# Patient Record
Sex: Female | Born: 1955 | Race: White | Hispanic: No | Marital: Single | Smoking: Never smoker
Health system: Southern US, Community
[De-identification: ages and names within clinical notes are randomized; demographics above are authoritative.]

## PROBLEM LIST (undated history)

## (undated) DIAGNOSIS — M549 Dorsalgia, unspecified: Secondary | ICD-10-CM

## (undated) DIAGNOSIS — M542 Cervicalgia: Secondary | ICD-10-CM

## (undated) DIAGNOSIS — S8290XA Unspecified fracture of unspecified lower leg, initial encounter for closed fracture: Secondary | ICD-10-CM

## (undated) DIAGNOSIS — E559 Vitamin D deficiency, unspecified: Secondary | ICD-10-CM

## (undated) DIAGNOSIS — I10 Essential (primary) hypertension: Secondary | ICD-10-CM

## (undated) DIAGNOSIS — F32A Depression, unspecified: Secondary | ICD-10-CM

## (undated) DIAGNOSIS — M1990 Unspecified osteoarthritis, unspecified site: Secondary | ICD-10-CM

## (undated) DIAGNOSIS — E079 Disorder of thyroid, unspecified: Secondary | ICD-10-CM

## (undated) DIAGNOSIS — E785 Hyperlipidemia, unspecified: Secondary | ICD-10-CM

## (undated) HISTORY — DX: Hyperlipidemia, unspecified: E78.5

## (undated) HISTORY — DX: Depression, unspecified: F32.A

## (undated) HISTORY — DX: Vitamin D deficiency, unspecified: E55.9

## (undated) HISTORY — DX: Unspecified fracture of unspecified lower leg, initial encounter for closed fracture: S82.90XA

## (undated) HISTORY — DX: Cervicalgia: M54.2

## (undated) HISTORY — DX: Unspecified osteoarthritis, unspecified site: M19.90

## (undated) HISTORY — DX: Disorder of thyroid, unspecified: E07.9

## (undated) HISTORY — DX: Dorsalgia, unspecified: M54.9

## (undated) HISTORY — PX: TONSILLECTOMY: SUR1361

## (undated) HISTORY — DX: Essential (primary) hypertension: I10

---

## 2003-04-14 ENCOUNTER — Encounter: Admission: RE | Admit: 2003-04-14 | Discharge: 2003-07-13 | Payer: Self-pay | Admitting: Family Medicine

## 2003-05-01 ENCOUNTER — Encounter: Admission: RE | Admit: 2003-05-01 | Discharge: 2003-05-01 | Payer: Self-pay | Admitting: Family Medicine

## 2003-05-01 ENCOUNTER — Encounter: Payer: Self-pay | Admitting: Family Medicine

## 2004-07-20 ENCOUNTER — Other Ambulatory Visit: Admission: RE | Admit: 2004-07-20 | Discharge: 2004-07-20 | Payer: Self-pay | Admitting: Family Medicine

## 2006-01-24 ENCOUNTER — Other Ambulatory Visit: Admission: RE | Admit: 2006-01-24 | Discharge: 2006-01-24 | Payer: Self-pay | Admitting: Family Medicine

## 2006-06-12 ENCOUNTER — Encounter: Admission: RE | Admit: 2006-06-12 | Discharge: 2006-06-12 | Payer: Self-pay | Admitting: Family Medicine

## 2007-02-13 ENCOUNTER — Other Ambulatory Visit: Admission: RE | Admit: 2007-02-13 | Discharge: 2007-02-13 | Payer: Self-pay | Admitting: Family Medicine

## 2007-07-11 ENCOUNTER — Encounter: Admission: RE | Admit: 2007-07-11 | Discharge: 2007-07-11 | Payer: Self-pay | Admitting: Family Medicine

## 2008-03-18 ENCOUNTER — Other Ambulatory Visit: Admission: RE | Admit: 2008-03-18 | Discharge: 2008-03-18 | Payer: Self-pay | Admitting: Family Medicine

## 2008-09-23 ENCOUNTER — Encounter: Admission: RE | Admit: 2008-09-23 | Discharge: 2008-09-23 | Payer: Self-pay | Admitting: Family Medicine

## 2009-04-19 ENCOUNTER — Other Ambulatory Visit: Admission: RE | Admit: 2009-04-19 | Discharge: 2009-04-19 | Payer: Self-pay | Admitting: Family Medicine

## 2009-04-21 ENCOUNTER — Encounter: Admission: RE | Admit: 2009-04-21 | Discharge: 2009-04-21 | Payer: Self-pay | Admitting: Family Medicine

## 2010-09-20 ENCOUNTER — Inpatient Hospital Stay (HOSPITAL_COMMUNITY): Admission: AD | Admit: 2010-09-20 | Discharge: 2010-09-23 | Payer: Self-pay | Admitting: Family Medicine

## 2010-09-20 ENCOUNTER — Ambulatory Visit: Payer: Self-pay | Admitting: Family Medicine

## 2010-09-28 ENCOUNTER — Ambulatory Visit: Payer: Self-pay | Admitting: Obstetrics and Gynecology

## 2010-12-02 ENCOUNTER — Other Ambulatory Visit
Admission: RE | Admit: 2010-12-02 | Discharge: 2010-12-02 | Payer: Self-pay | Source: Home / Self Care | Admitting: Family Medicine

## 2010-12-08 ENCOUNTER — Encounter
Admission: RE | Admit: 2010-12-08 | Discharge: 2010-12-08 | Payer: Self-pay | Source: Home / Self Care | Attending: Family Medicine | Admitting: Family Medicine

## 2010-12-13 ENCOUNTER — Encounter
Admission: RE | Admit: 2010-12-13 | Discharge: 2010-12-13 | Payer: Self-pay | Source: Home / Self Care | Attending: Family Medicine | Admitting: Family Medicine

## 2010-12-15 ENCOUNTER — Encounter
Admission: RE | Admit: 2010-12-15 | Discharge: 2010-12-15 | Payer: Self-pay | Source: Home / Self Care | Attending: Family Medicine | Admitting: Family Medicine

## 2011-02-15 LAB — VANCOMYCIN, TROUGH: Vancomycin Tr: 8.9 ug/mL — ABNORMAL LOW (ref 10.0–20.0)

## 2011-02-15 LAB — COMPREHENSIVE METABOLIC PANEL
Albumin: 3.9 g/dL (ref 3.5–5.2)
Alkaline Phosphatase: 94 U/L (ref 39–117)
BUN: 10 mg/dL (ref 6–23)
CO2: 24 mEq/L (ref 19–32)
Chloride: 101 mEq/L (ref 96–112)
Creatinine, Ser: 0.6 mg/dL (ref 0.4–1.2)
GFR calc non Af Amer: >60 mL/min (ref >60)
Glucose, Bld: 305 mg/dL — ABNORMAL HIGH (ref 70–99)
Potassium: 4.1 mEq/L (ref 3.5–5.1)
Total Bilirubin: 0.4 mg/dL (ref 0.3–1.2)

## 2011-02-15 LAB — CBC
HCT: 40.9 % (ref 36.0–46.0)
Hemoglobin: 13.7 g/dL (ref 12.0–15.0)
MCH: 27.5 pg (ref 26.0–34.0)
MCV: 82 fL (ref 78.0–100.0)
RBC: 4.99 MIL/uL (ref 3.87–5.11)
WBC: 15.8 10*3/uL — ABNORMAL HIGH (ref 4.0–10.5)

## 2011-02-15 LAB — CULTURE, BLOOD (ROUTINE X 2)
Culture  Setup Time: 201110190410
Culture: NO GROWTH
Culture: NO GROWTH

## 2011-02-15 LAB — GLUCOSE, CAPILLARY
Glucose-Capillary: 121 mg/dL — ABNORMAL HIGH (ref 70–99)
Glucose-Capillary: 124 mg/dL — ABNORMAL HIGH (ref 70–99)
Glucose-Capillary: 208 mg/dL — ABNORMAL HIGH (ref 70–99)
Glucose-Capillary: 283 mg/dL — ABNORMAL HIGH (ref 70–99)
Glucose-Capillary: 299 mg/dL — ABNORMAL HIGH (ref 70–99)
Glucose-Capillary: 93 mg/dL (ref 70–99)

## 2011-02-15 LAB — HEMOGLOBIN A1C
Hgb A1c MFr Bld: 9.8 % — ABNORMAL HIGH (ref <5.7)
Mean Plasma Glucose: 235 mg/dL — ABNORMAL HIGH (ref <117)

## 2011-02-15 LAB — WOUND CULTURE

## 2011-07-05 ENCOUNTER — Encounter: Payer: Self-pay | Admitting: Podiatry

## 2011-07-05 DIAGNOSIS — E119 Type 2 diabetes mellitus without complications: Secondary | ICD-10-CM | POA: Insufficient documentation

## 2011-07-05 DIAGNOSIS — J45909 Unspecified asthma, uncomplicated: Secondary | ICD-10-CM | POA: Insufficient documentation

## 2011-07-05 DIAGNOSIS — E042 Nontoxic multinodular goiter: Secondary | ICD-10-CM | POA: Insufficient documentation

## 2011-07-05 DIAGNOSIS — I10 Essential (primary) hypertension: Secondary | ICD-10-CM

## 2011-07-05 DIAGNOSIS — M722 Plantar fascial fibromatosis: Secondary | ICD-10-CM

## 2011-07-05 DIAGNOSIS — E079 Disorder of thyroid, unspecified: Secondary | ICD-10-CM

## 2012-02-07 ENCOUNTER — Other Ambulatory Visit: Payer: Self-pay | Admitting: Family Medicine

## 2012-02-07 DIAGNOSIS — E049 Nontoxic goiter, unspecified: Secondary | ICD-10-CM

## 2012-02-12 ENCOUNTER — Ambulatory Visit
Admission: RE | Admit: 2012-02-12 | Discharge: 2012-02-12 | Disposition: A | Discharge status: Home / Self Care | Payer: BC Managed Care – PPO | Source: Ambulatory Visit | Attending: Family Medicine | Admitting: Family Medicine

## 2012-02-12 DIAGNOSIS — E049 Nontoxic goiter, unspecified: Secondary | ICD-10-CM

## 2013-03-11 ENCOUNTER — Other Ambulatory Visit (HOSPITAL_COMMUNITY)
Admission: RE | Admit: 2013-03-11 | Discharge: 2013-03-11 | Disposition: A | Discharge status: Home / Self Care | Payer: BC Managed Care – PPO | Source: Ambulatory Visit | Attending: Family Medicine | Admitting: Family Medicine

## 2013-03-11 ENCOUNTER — Other Ambulatory Visit: Payer: Self-pay | Admitting: Family Medicine

## 2013-03-11 DIAGNOSIS — Z1151 Encounter for screening for human papillomavirus (HPV): Secondary | ICD-10-CM | POA: Insufficient documentation

## 2013-03-11 DIAGNOSIS — E049 Nontoxic goiter, unspecified: Secondary | ICD-10-CM

## 2013-03-11 DIAGNOSIS — Z124 Encounter for screening for malignant neoplasm of cervix: Secondary | ICD-10-CM | POA: Insufficient documentation

## 2013-03-11 DIAGNOSIS — Z1231 Encounter for screening mammogram for malignant neoplasm of breast: Secondary | ICD-10-CM

## 2013-03-19 ENCOUNTER — Ambulatory Visit
Admission: RE | Admit: 2013-03-19 | Discharge: 2013-03-19 | Disposition: A | Discharge status: Home / Self Care | Payer: BC Managed Care – PPO | Source: Ambulatory Visit | Attending: Family Medicine | Admitting: Family Medicine

## 2013-03-19 ENCOUNTER — Other Ambulatory Visit: Payer: BC Managed Care – PPO

## 2013-03-19 DIAGNOSIS — E049 Nontoxic goiter, unspecified: Secondary | ICD-10-CM

## 2013-03-31 ENCOUNTER — Ambulatory Visit
Admission: RE | Admit: 2013-03-31 | Discharge: 2013-03-31 | Disposition: A | Discharge status: Home / Self Care | Payer: BC Managed Care – PPO | Source: Ambulatory Visit | Attending: Family Medicine | Admitting: Family Medicine

## 2013-03-31 DIAGNOSIS — Z1231 Encounter for screening mammogram for malignant neoplasm of breast: Secondary | ICD-10-CM

## 2013-04-01 ENCOUNTER — Ambulatory Visit: Payer: BC Managed Care – PPO

## 2014-10-14 ENCOUNTER — Other Ambulatory Visit: Payer: Self-pay | Admitting: Family Medicine

## 2014-10-14 DIAGNOSIS — E049 Nontoxic goiter, unspecified: Secondary | ICD-10-CM

## 2014-10-16 ENCOUNTER — Other Ambulatory Visit: Payer: BC Managed Care – PPO

## 2014-10-22 ENCOUNTER — Ambulatory Visit
Admission: RE | Admit: 2014-10-22 | Discharge: 2014-10-22 | Disposition: A | Discharge status: Home / Self Care | Payer: BC Managed Care – PPO | Source: Ambulatory Visit | Attending: Family Medicine | Admitting: Family Medicine

## 2014-10-22 DIAGNOSIS — E049 Nontoxic goiter, unspecified: Secondary | ICD-10-CM

## 2015-02-10 ENCOUNTER — Other Ambulatory Visit: Payer: Self-pay | Admitting: Orthopaedic Surgery

## 2015-02-10 DIAGNOSIS — M25552 Pain in left hip: Secondary | ICD-10-CM

## 2015-02-13 ENCOUNTER — Ambulatory Visit
Admission: RE | Admit: 2015-02-13 | Discharge: 2015-02-13 | Disposition: A | Discharge status: Home / Self Care | Payer: BLUE CROSS/BLUE SHIELD | Source: Ambulatory Visit | Attending: Orthopaedic Surgery | Admitting: Orthopaedic Surgery

## 2015-02-13 DIAGNOSIS — M25552 Pain in left hip: Secondary | ICD-10-CM

## 2015-02-23 ENCOUNTER — Other Ambulatory Visit: Payer: Self-pay | Admitting: Orthopaedic Surgery

## 2015-02-23 DIAGNOSIS — M25552 Pain in left hip: Secondary | ICD-10-CM

## 2015-04-23 ENCOUNTER — Other Ambulatory Visit (HOSPITAL_COMMUNITY)
Admission: RE | Admit: 2015-04-23 | Discharge: 2015-04-23 | Disposition: A | Discharge status: Home / Self Care | Payer: BLUE CROSS/BLUE SHIELD | Source: Ambulatory Visit | Attending: Family Medicine | Admitting: Family Medicine

## 2015-04-23 ENCOUNTER — Other Ambulatory Visit: Payer: Self-pay | Admitting: Family Medicine

## 2015-04-23 DIAGNOSIS — Z1231 Encounter for screening mammogram for malignant neoplasm of breast: Secondary | ICD-10-CM

## 2015-04-23 DIAGNOSIS — Z124 Encounter for screening for malignant neoplasm of cervix: Secondary | ICD-10-CM | POA: Diagnosis present

## 2015-04-23 DIAGNOSIS — Z1151 Encounter for screening for human papillomavirus (HPV): Secondary | ICD-10-CM | POA: Insufficient documentation

## 2015-04-27 LAB — CYTOLOGY - PAP

## 2015-04-29 ENCOUNTER — Ambulatory Visit: Payer: BLUE CROSS/BLUE SHIELD

## 2015-05-05 ENCOUNTER — Ambulatory Visit: Payer: BLUE CROSS/BLUE SHIELD

## 2015-05-05 ENCOUNTER — Ambulatory Visit
Admission: RE | Admit: 2015-05-05 | Discharge: 2015-05-05 | Disposition: A | Discharge status: Home / Self Care | Payer: BLUE CROSS/BLUE SHIELD | Source: Ambulatory Visit | Attending: Family Medicine | Admitting: Family Medicine

## 2015-05-05 DIAGNOSIS — Z1231 Encounter for screening mammogram for malignant neoplasm of breast: Secondary | ICD-10-CM

## 2015-05-06 ENCOUNTER — Ambulatory Visit
Admission: RE | Admit: 2015-05-06 | Discharge: 2015-05-06 | Disposition: A | Discharge status: Home / Self Care | Payer: BLUE CROSS/BLUE SHIELD | Source: Ambulatory Visit | Attending: Family Medicine | Admitting: Family Medicine

## 2015-11-18 ENCOUNTER — Ambulatory Visit (INDEPENDENT_AMBULATORY_CARE_PROVIDER_SITE_OTHER): Payer: 59 | Admitting: Endocrinology

## 2015-11-18 ENCOUNTER — Encounter: Payer: Self-pay | Admitting: Endocrinology

## 2015-11-18 VITALS — BP 124/72 | HR 72 | Temp 98.2°F | Resp 16 | Ht 62.0 in | Wt 181.6 lb

## 2015-11-18 DIAGNOSIS — E1165 Type 2 diabetes mellitus with hyperglycemia: Secondary | ICD-10-CM | POA: Diagnosis not present

## 2015-11-18 DIAGNOSIS — Z794 Long term (current) use of insulin: Secondary | ICD-10-CM

## 2015-11-18 NOTE — Patient Instructions (Signed)
Check blood sugars on waking up   times a week Also check blood sugars about 2 hours after a meal and do this after different meals by rotation  Recommended blood sugar levels on waking up is 90-130 and about 2 hours after meal is 130-160  Please bring your blood sugar monitor to each visit, thank you  Avoid cereals in ams  Leave off Trulicity

## 2015-11-18 NOTE — Progress Notes (Addendum)
Patient ID: Natasha Chandler, female   DOB: 1956-11-18, 59 y.o.   MRN: [id number]           Reason for Appointment: Consultation for Type 2 Diabetes  Referring physician: Cam Hai  History of Present Illness:          Date of diagnosis of type 2 diabetes mellitus: 2004       Background history:   She was treated with oral hypoglycemic drugs like metformin and first few years Insulin was started around 2008-9 but details are not available. She thinks she has taken Lantus for several years, at some point was also taking Humalog. Trulicity was started 1-2 years ago, taking Invokana 3 yrs  Over the last 2-3 years she has been having poor control and has had difficulty focusing on her diabetes because of family issues and periodically not having insurance  Recent history:   INSULIN regimen is: Lantus 68-71 in am      She has been referred here for poor control of her diabetes. She has been on a regimen of basal insulin, Trulicity, metformin and Invokana for some time but without apparently good control.  Previous records are not available in her last A1c was 8.4 in June More recently because of her having gastroenteritis about a month ago she apparently had a glucose of 539 with her PCP and has been referred for further management  No recent A1c is available  Current blood sugar patterns and problems identified:    until about 2-3 weeks ago the patient was taking her medications erratically because of cost and will not take all of them consistently  More recently the patient has been trying to watch her portions and carbohydrates better in her blood sugars are improving especially in the last 10 days  She has fairly good fasting readings recently although higher this morning from a snack last night  She has not done many postprandial readings but they appear to be sporadically higher, mostly when she is going off her diet and eating sweets or eating cereal in the morning including  today  Because of her recent intercurrent illnesses and other issues she has not been able to do any walking for exercise which she usually does  Non-insulin hypoglycemic drugs the patient is taking are: Metformin 1g bid, Invokana 300 mg daily, Trulicity 1.5 mg weekly  Side effects from medications have been:none  Compliance with the medical regimen: generally poor Hypoglycemia: had one episode of hypoglycemia at 3 PM   Glucose monitoring:  done 1-to times a day         Glucometer: One Touch.      Blood Glucose readings by time of day and averages from meter download:  PREMEAL Breakfast Lunch Dinner Bedtime  Overall   Glucose range: 88-241 91-252 99-278 106-205 60-278  Median:     130    Self-care: The diet that the patient has been following is: tries to limit sweets.     Meal times are:  Breakfast is at Lunch: Dinner: 7 pm   Typical meal intake: Breakfast is cereal              Dietician visit, most recent: 4 yrs               Exercise: 2 miles 3-4/7 irreg   Weight history: 162-187  Wt Readings from Last 3 Encounters:  11/18/15 181 lb 9.6 oz (82.373 kg)    Glycemic control:   Lab Results  Component  Value Date   HGBA1C * 09/21/2010    9.8 (NOTE)                                                                       According to the ADA Clinical Practice Recommendations for 2011, when HbA1c is used as a screening test:   >=6.5%   Diagnostic of Diabetes Mellitus           (if abnormal result  is confirmed)  5.7-6.4%   Increased risk of developing Diabetes Mellitus  References:Diagnosis and Classification of Diabetes Mellitus,Diabetes Care,2011,34(Suppl 1):S62-S69 and Standards of Medical Care in         Diabetes - 2011,Diabetes Care,2011,34  (Suppl 1):S11-S61.   Lab Results  Component Value Date   CREATININE 0.60 09/21/2010         Medication List       This list is accurate as of: 11/18/15  3:41 PM.  Always use your most recent med list.                amLODipine 10 MG tablet  Commonly known as:  NORVASC  Take 10 mg by mouth daily.     INVOKANA 300 MG Tabs tablet  Generic drug:  canagliflozin  Take 300 mg by mouth daily before breakfast.     LANTUS SOLOSTAR [place]  Inject 66-70 Units into the skin daily.     losartan 100 MG tablet  Commonly known as:  COZAAR  Take 100 mg by mouth daily.     METFORMIN HCL PO  Take 500 mg by mouth 2 (two) times daily.     simvastatin 20 MG tablet  Commonly known as:  ZOCOR  Take 20 mg by mouth daily.     sodium chloride 0.9 % SOLN 100 mL with insulin regular 100 units/mL SOLN 1 Units/mL  Inject into the vein continuous.     TRULICITY 1.5 MG/0.5ML Sopn  Generic drug:  Dulaglutide  Inject 1.5 mg into the skin once a week.        Allergies:  Allergies  Allergen Reactions  . Penicillins   . Sulfa Antibiotics     Past Medical History  Diagnosis Date  . Asthma   . Thyroid disease   . Hypertension   . Diabetes mellitus     Past Surgical History  Procedure Laterality Date  . Tonsillectomy      Family History  Problem Relation Age of Onset  . Diabetes Mother   . Diabetes Father   . Diabetes Maternal Aunt     Social History:  reports that she does not drink alcohol. Her tobacco and drug histories are not on file.    Review of Systems    Lipid history:    No results found for: CHOL, HDL, LDLCALC, LDLDIRECT, TRIG, CHOLHDL         Hypertension: present  Most recent eye exam was 7/16  Most recent foot exam: 12/16  Review of Systems  Constitutional: Negative for reduced appetite and malaise.  HENT: Negative for trouble swallowing.   Eyes: Positive for blurred vision.  Respiratory: Negative for cough.   Cardiovascular: Negative for chest pain.  Gastrointestinal: Negative for nausea and vomiting.  Endocrine: Negative for abnormal weight gain, general  weakness and light-headedness.  Genitourinary: Negative for frequency.  Musculoskeletal: Positive for joint pain.        Has had hip pain from old injuries  Skin: Negative for rash.  Neurological: Negative for numbness and tingling.  Psychiatric/Behavioral: Negative for depressed mood.   Thyroid: She has had a goiter since at least 2013 Has only one nodule 1.6 cm to the left of the isthmus which has been stable as of the last ultrasound in 2015   LABS:  No visits with results within 1 Week(s) from this visit. Latest known visit with results is:  Orders Only on 04/23/2015  Component Date Value Ref Range Status  . CYTOLOGY - PAP 04/23/2015 PAP RESULT   Final    Physical Examination:  BP 124/72 mmHg  Pulse 72  Temp(Src) 98.2 F (36.8 C) (Oral)  Resp 16  Ht 5\' 2"  (1.575 m)  Wt 181 lb 9.6 oz (82.373 kg)  BMI 33.21 kg/m2  SpO2 97%  GENERAL:         Patient has generalized obesity.   HEENT:         Eye exam shows normal external appearance. Fundus exam shows no retinopathy.  Oral exam shows normal mucosa .  NECK:   There is no lymphadenopathy Thyroid is enlarged, firm, nodular and about twice normal, relatively larger on the left Carotids are normal to palpation and no bruit heard LUNGS:         Chest is symmetrical. Lungs are clear to auscultation.Marland Kitchen.   HEART:         Heart sounds:  S1 and S2 are normal. No murmur or click heard., no S3 or S4.   ABDOMEN:   There is no distention present. Liver and spleen are not palpable. No other mass or tenderness present.   NEUROLOGICAL:   Vibration sense is only mildly reduced in distal first toes. Ankle jerks are absent bilaterally.          Biceps reflexes are normal Diabetic foot exam shows normal monofilament sensation in the toes and plantar surfaces, no skin lesions or ulcers on the feet and normal pedal pulses  MUSCULOSKELETAL:  There is no swelling or deformity of the peripheral joints. Spine is normal to inspection.   EXTREMITIES:     There is no edema. No skin lesions present.Marland Kitchen. SKIN:       No rash or lesions of concern.         ASSESSMENT:  Diabetes type 2, uncontrolled with BMI 33 She has had long-standing diabetes and has difficulty with consistent control partly related to her financial issues, stress and family issues preventing her from being consistently compliant Although her blood sugars are generally near normal in the last 10 days she has not checked enough readings after meals Has sporadic high readings nonfasting usually related to excessive carbohydrate intake and is also having issues with portion control at times despite taking Trulicity   She is not taking any mealtime coverage at this time Does need more diabetes education   Complications:none evident  GOITER: 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  Consider using [employer] brand regular insulin if she has consistently high postprandial readings  Consultation with dietitian  More consistent glucose monitoring after meals  Restart walking for exercise either outside or at the gym  Moderate on carbohydrates and sweets.  Discussed options for breakfast instead of cereals  Review blood sugars in one month  Patient Instructions  Check blood sugars on waking up   times a week Also check blood sugars about 2 hours after a meal and do this after different meals by rotation  Recommended blood sugar levels on waking up is 90-130 and about 2 hours after meal is 130-160  Please bring your blood sugar monitor to each visit, thank you  Avoid cereals in ams  Leave off Trulicity    [facility] 11/18/2015, 3:41 PM   Note: This office note was  prepared with Dragon voice recognition system technology. Any transcriptional errors that result from this process are unintentional.

## 2015-11-26 ENCOUNTER — Encounter (HOSPITAL_BASED_OUTPATIENT_CLINIC_OR_DEPARTMENT_OTHER): Payer: Self-pay | Admitting: *Deleted

## 2015-11-26 DIAGNOSIS — Z79899 Other long term (current) drug therapy: Secondary | ICD-10-CM | POA: Insufficient documentation

## 2015-11-26 DIAGNOSIS — Z7984 Long term (current) use of oral hypoglycemic drugs: Secondary | ICD-10-CM | POA: Diagnosis not present

## 2015-11-26 DIAGNOSIS — J45909 Unspecified asthma, uncomplicated: Secondary | ICD-10-CM | POA: Insufficient documentation

## 2015-11-26 DIAGNOSIS — Y93K1 Activity, walking an animal: Secondary | ICD-10-CM | POA: Insufficient documentation

## 2015-11-26 DIAGNOSIS — S8002XA Contusion of left knee, initial encounter: Secondary | ICD-10-CM | POA: Insufficient documentation

## 2015-11-26 DIAGNOSIS — S80211A Abrasion, right knee, initial encounter: Secondary | ICD-10-CM | POA: Insufficient documentation

## 2015-11-26 DIAGNOSIS — S60512A Abrasion of left hand, initial encounter: Secondary | ICD-10-CM | POA: Insufficient documentation

## 2015-11-26 DIAGNOSIS — Y998 Other external cause status: Secondary | ICD-10-CM | POA: Diagnosis not present

## 2015-11-26 DIAGNOSIS — E119 Type 2 diabetes mellitus without complications: Secondary | ICD-10-CM | POA: Insufficient documentation

## 2015-11-26 DIAGNOSIS — E785 Hyperlipidemia, unspecified: Secondary | ICD-10-CM | POA: Diagnosis not present

## 2015-11-26 DIAGNOSIS — S8001XA Contusion of right knee, initial encounter: Secondary | ICD-10-CM | POA: Diagnosis not present

## 2015-11-26 DIAGNOSIS — S60511A Abrasion of right hand, initial encounter: Secondary | ICD-10-CM | POA: Diagnosis not present

## 2015-11-26 DIAGNOSIS — Z880 Allergy status to penicillin: Secondary | ICD-10-CM | POA: Insufficient documentation

## 2015-11-26 DIAGNOSIS — Y9289 Other specified places as the place of occurrence of the external cause: Secondary | ICD-10-CM | POA: Insufficient documentation

## 2015-11-26 DIAGNOSIS — S0081XA Abrasion of other part of head, initial encounter: Secondary | ICD-10-CM | POA: Insufficient documentation

## 2015-11-26 DIAGNOSIS — W1839XA Other fall on same level, initial encounter: Secondary | ICD-10-CM | POA: Insufficient documentation

## 2015-11-26 DIAGNOSIS — S80212A Abrasion, left knee, initial encounter: Secondary | ICD-10-CM | POA: Insufficient documentation

## 2015-11-26 DIAGNOSIS — S93401A Sprain of unspecified ligament of right ankle, initial encounter: Secondary | ICD-10-CM | POA: Diagnosis not present

## 2015-11-26 DIAGNOSIS — S99911A Unspecified injury of right ankle, initial encounter: Secondary | ICD-10-CM | POA: Diagnosis present

## 2015-11-26 DIAGNOSIS — Z794 Long term (current) use of insulin: Secondary | ICD-10-CM | POA: Insufficient documentation

## 2015-11-26 NOTE — ED Notes (Signed)
Pt c/o fall from standing landing on sidewalk c/o abrasions to face and hands denies LOC

## 2015-11-27 ENCOUNTER — Emergency Department (HOSPITAL_BASED_OUTPATIENT_CLINIC_OR_DEPARTMENT_OTHER): Payer: 59

## 2015-11-27 ENCOUNTER — Emergency Department (HOSPITAL_BASED_OUTPATIENT_CLINIC_OR_DEPARTMENT_OTHER)
Admission: EM | Admit: 2015-11-27 | Discharge: 2015-11-27 | Disposition: A | Discharge status: Home / Self Care | Payer: 59 | Attending: Emergency Medicine | Admitting: Emergency Medicine

## 2015-11-27 DIAGNOSIS — S8000XA Contusion of unspecified knee, initial encounter: Secondary | ICD-10-CM

## 2015-11-27 DIAGNOSIS — S93401A Sprain of unspecified ligament of right ankle, initial encounter: Secondary | ICD-10-CM

## 2015-11-27 DIAGNOSIS — S0081XA Abrasion of other part of head, initial encounter: Secondary | ICD-10-CM

## 2015-11-27 DIAGNOSIS — W19XXXA Unspecified fall, initial encounter: Secondary | ICD-10-CM

## 2015-11-27 MED ORDER — HYDROCODONE-ACETAMINOPHEN 5-325 MG PO TABS: 1.0000 | ORAL_TABLET | Freq: Four times a day (QID) | ORAL | Status: DC | PRN

## 2015-11-27 MED ORDER — IBUPROFEN 600 MG PO TABS: 600.0000 mg | ORAL_TABLET | Freq: Four times a day (QID) | ORAL | Status: DC | PRN

## 2015-11-27 MED ORDER — HYDROCODONE-ACETAMINOPHEN 5-325 MG PO TABS
1.0000 | ORAL_TABLET | Freq: Once | ORAL | Status: AC
  Administered 2015-11-27: 1 via ORAL
  Filled 2015-11-27: qty 1

## 2015-11-27 MED ORDER — BACITRACIN ZINC 500 UNIT/GM EX OINT
TOPICAL_OINTMENT | Freq: Two times a day (BID) | CUTANEOUS | Status: DC
  Administered 2015-11-27: 15.5556 via TOPICAL
  Filled 2015-11-27: qty 28.35

## 2015-11-27 NOTE — Discharge Instructions (Signed)
Ankle Sprain °An ankle sprain is an injury to the strong, fibrous tissues (ligaments) that hold the bones of your ankle joint together.  °CAUSES °An ankle sprain is usually caused by a fall or by twisting your ankle. Ankle sprains most commonly occur when you step on the outer edge of your foot, and your ankle turns inward. People who participate in sports are more prone to these types of injuries.  °SYMPTOMS  °· Pain in your ankle. The pain may be present at rest or only when you are trying to stand or walk. °· Swelling. °· Bruising. Bruising may develop immediately or within 1 to 2 days after your injury. °· Difficulty standing or walking, particularly when turning corners or changing directions. °DIAGNOSIS  °Your caregiver will ask you details about your injury and perform a physical exam of your ankle to determine if you have an ankle sprain. During the physical exam, your caregiver will press on and apply pressure to specific areas of your foot and ankle. Your caregiver will try to move your ankle in certain ways. An X-ray exam may be done to be sure a bone was not broken or a ligament did not separate from one of the bones in your ankle (avulsion fracture).  °TREATMENT  °Certain types of braces can help stabilize your ankle. Your caregiver can make a recommendation for this. Your caregiver may recommend the use of medicine for pain. If your sprain is severe, your caregiver may refer you to a surgeon who helps to restore function to parts of your skeletal system (orthopedist) or a physical therapist. °HOME CARE INSTRUCTIONS  °· Apply ice to your injury for 1-2 days or as directed by your caregiver. Applying ice helps to reduce inflammation and pain. °¨ Put ice in a plastic bag. °¨ Place a towel between your skin and the bag. °¨ Leave the ice on for 15-20 minutes at a time, every 2 hours while you are awake. °· Only take over-the-counter or prescription medicines for pain, discomfort, or fever as directed by  your caregiver. °· Elevate your injured ankle above the level of your heart as much as possible for 2-3 days. °· If your caregiver recommends crutches, use them as instructed. Gradually put weight on the affected ankle. Continue to use crutches or a cane until you can walk without feeling pain in your ankle. °· If you have a plaster splint, wear the splint as directed by your caregiver. Do not rest it on anything harder than a pillow for the first 24 hours. Do not put weight on it. Do not get it wet. You may take it off to take a shower or bath. °· You may have been given an elastic bandage to wear around your ankle to provide support. If the elastic bandage is too tight (you have numbness or tingling in your foot or your foot becomes cold and blue), adjust the bandage to make it comfortable. °· If you have an air splint, you may blow more air into it or let air out to make it more comfortable. You may take your splint off at night and before taking a shower or bath. Wiggle your toes in the splint several times per day to decrease swelling. °SEEK MEDICAL CARE IF:  °· You have rapidly increasing bruising or swelling. °· Your toes feel extremely cold or you lose feeling in your foot. °· Your pain is not relieved with medicine. °SEEK IMMEDIATE MEDICAL CARE IF: °· Your toes are numb or blue. °·   You have severe pain that is increasing. MAKE SURE YOU:   Understand these instructions.  Will watch your condition.  Will get help right away if you are not doing well or get worse.   This information is not intended to replace advice given to you by your health care provider. Make sure you discuss any questions you have with your health care provider.   Document Released: 11/20/2005 Document Revised: 12/11/2014 Document Reviewed: 12/02/2011 Elsevier Interactive Patient Education 2016 [employer]  Abrasion An abrasion is a cut or scrape on the outer surface of your skin. An abrasion does not extend through all  of the layers of your skin. It is important to care for your abrasion properly to prevent infection. CAUSES Most abrasions are caused by falling on or gliding across the ground or another surface. When your skin rubs on something, the outer and inner layer of skin rubs off.  SYMPTOMS A cut or scrape is the main symptom of this condition. The scrape may be bleeding, or it may appear red or pink. If there was an associated fall, there may be an underlying bruise. DIAGNOSIS An abrasion is diagnosed with a physical exam. TREATMENT Treatment for this condition depends on how large and deep the abrasion is. Usually, your abrasion will be cleaned with water and mild soap. This removes any dirt or debris that may be stuck. An antibiotic ointment may be applied to the abrasion to help prevent infection. A bandage (dressing) may be placed on the abrasion to keep it clean. You may also need a tetanus shot. HOME CARE INSTRUCTIONS Medicines  Take or apply medicines only as directed by your health care provider.  If you were prescribed an antibiotic ointment, finish all of it even if you start to feel better. Wound Care  Clean the wound with mild soap and water 2-3 times per day or as directed by your health care provider. Pat your wound dry with a clean towel. Do not rub it.  There are many different ways to close and cover a wound. Follow instructions from your health care provider about:  Wound care.  Dressing changes and removal.  Check your wound every day for signs of infection. Watch for:  Redness, swelling, or pain.  Fluid, blood, or pus. General Instructions  Keep the dressing dry as directed by your health care provider. Do not take baths, swim, use a hot tub, or do anything that would put your wound underwater until your health care provider approves.  If there is swelling, raise (elevate) the injured area above the level of your heart while you are sitting or lying down.  Keep all  follow-up visits as directed by your health care provider. This is important. SEEK MEDICAL CARE IF:  You received a tetanus shot and you have swelling, severe pain, redness, or bleeding at the injection site.  Your pain is not controlled with medicine.  You have increased redness, swelling, or pain at the site of your wound. SEEK IMMEDIATE MEDICAL CARE IF:  You have a red streak going away from your wound.  You have a fever.  You have fluid, blood, or pus coming from your wound.  You notice a bad smell coming from your wound or your dressing.   This information is not intended to replace advice given to you by your health care provider. Make sure you discuss any questions you have with your health care provider.   Document Released: 08/30/2005 Document Revised: 08/11/2015 Document Reviewed:  11/18/2014 Elsevier Interactive Patient Education [employer]

## 2015-11-27 NOTE — ED Notes (Signed)
Patient transported to X-ray 

## 2015-11-27 NOTE — ED Provider Notes (Signed)
CSN: [id number]     Arrival date & time 11/26/15  2332 History   First MD Initiated Contact with Patient 11/27/15 0103     Chief Complaint  Patient presents with  . Fall     (Consider location/radiation/quality/duration/timing/severity/associated sxs/prior Treatment) HPI  This is a 59 year old female with a history of diabetes, hypertension who presents following a fall. Patient reports that she was walking her dog when she got pulled off the curb. She states "I fell on my face.". She braced herself with her hands. She reports bilateral hand abrasions, bilateral knee pain, and facial abrasions. Denies loss of consciousness. Denies vomiting. She takes a baby aspirin daily but denies any anticoagulant use. Denies any dizziness. She has been ambulatory. Reports pain to her right ankle. Current pain is 2 out of 10.  Past Medical History  Diagnosis Date  . Asthma   . Hypertension   . Diabetes mellitus   . Hyperlipidemia   . Vitamin D deficiency   . Thyroid disease     Small goiter, stable   Past Surgical History  Procedure Laterality Date  . Tonsillectomy     Family History  Problem Relation Age of Onset  . Diabetes Mother   . Diabetes Father   . Diabetes Maternal Aunt    Social History  Substance Use Topics  . Smoking status: Unknown If Ever Smoked  . Smokeless tobacco: None  . Alcohol Use: No   OB History    No data available     Review of Systems  Respiratory: Negative for chest tightness and shortness of breath.   Cardiovascular: Negative for chest pain.  Gastrointestinal: Negative for nausea and vomiting.  Musculoskeletal:       Bilateral knee, right ankle pain  Skin: Positive for wound.  Neurological: Negative for syncope.  All other systems reviewed and are negative.     Allergies  Penicillins and Sulfa antibiotics  Home Medications   Prior to Admission medications   Medication Sig Start Date End Date Taking? Authorizing Provider  amLODipine  (NORVASC) 10 MG tablet Take 10 mg by mouth daily.    Historical Provider, MD  canagliflozin (INVOKANA) 300 MG TABS tablet Take 300 mg by mouth daily before breakfast.    Historical Provider, MD  Dulaglutide (TRULICITY) 1.5 MG/0.5ML SOPN Inject 1.5 mg into the skin once a week.    Historical Provider, MD  HYDROcodone-acetaminophen (NORCO/VICODIN) 5-325 MG tablet Take 1 tablet by mouth every 6 (six) hours as needed. 11/27/15   Shon Batonourtney F Reeve Turnley, MD  ibuprofen (ADVIL,MOTRIN) 600 MG tablet Take 1 tablet (600 mg total) by mouth every 6 (six) hours as needed. 11/27/15   Shon Batonourtney F Neeley Sedivy, MD  Insulin Glargine (LANTUS SOLOSTAR [place]) Inject 66-70 Units into the skin daily.    Historical Provider, MD  losartan (COZAAR) 100 MG tablet Take 100 mg by mouth daily.    Historical Provider, MD  METFORMIN HCL PO Take 500 mg by mouth 2 (two) times daily.     Historical Provider, MD  simvastatin (ZOCOR) 20 MG tablet Take 20 mg by mouth daily.    Historical Provider, MD  sodium chloride 0.9 % SOLN 100 mL with insulin regular 100 units/mL SOLN 1 Units/mL Inject into the vein continuous.      Historical Provider, MD   BP 159/82 mmHg  Pulse 89  Temp(Src) 98.3 F (36.8 C)  Resp 18  Ht 5\' 2"  (1.575 m)  Wt 179 lb (81.194 kg)  BMI 32.73 kg/m2  SpO2 99%  Physical Exam  Constitutional: She is oriented to person, place, and time. She appears well-developed and well-nourished. No distress.  HENT:  Head: Normocephalic.  Mouth/Throat: Oropharynx is clear and moist.  Abrasions noted mostly over the left for head, left nasolabial fold and left cheek, no hemotympanum noted, no Battle sign or raccoon eyes  Eyes: Pupils are equal, round, and reactive to light.  Neck: Normal range of motion. Neck supple.  No midline C-spine tenderness  Cardiovascular: Normal rate, regular rhythm and normal heart sounds.   No murmur heard. Pulmonary/Chest: Effort normal and breath sounds normal. No respiratory distress. She has no wheezes.   Abdominal: Soft. Bowel sounds are normal. There is no tenderness. There is no rebound.  Musculoskeletal:  Normal range of motion of the bilateral knees, quadriceps tendon appears intact, contusions noted bilaterally, normal range of motion of the right ankle, mild swelling noted with tenderness over the lateral malleolus, 2+ DP pulse, normal range of motion of the bilateral wrists and fingers, no snuffbox tenderness  Neurological: She is alert and oriented to person, place, and time.  Skin: Skin is warm and dry.  Abrasions noted over the bilateral knees, superficial abrasions noted over the palmar aspects of the right and left hands as well as the first through third digits on the right hand  Psychiatric: She has a normal mood and affect.  Nursing note and vitals reviewed.   ED Course  Procedures (including critical care time) Labs Review Labs Reviewed - No data to display  Imaging Review Dg Knee 1-2 Views Right  11/27/2015  CLINICAL DATA:  Status post fall, with right knee pain. Initial encounter. EXAM: RIGHT KNEE - 1-2 VIEW COMPARISON:  Chronic knee radiographs performed earlier today at 2:03 a.m. FINDINGS: There is no evidence of acute fracture. The right patella appears intact. Visualized soft tissues are grossly unremarkable. IMPRESSION: No evidence of fracture.  The right patella appears intact. Electronically Signed   By: Roanna Raider M.D.   On: 11/27/2015 03:07   Dg Ankle Complete Right  11/27/2015  CLINICAL DATA:  59 year old female with fall and right ankle pain and swelling. EXAM: RIGHT ANKLE - COMPLETE 3+ VIEW COMPARISON:  None. FINDINGS: There is no evidence of fracture, dislocation, or joint effusion. There is no evidence of arthropathy or other focal bone abnormality. Soft tissues are unremarkable. IMPRESSION: Negative. Electronically Signed   By: Elgie Collard M.D.   On: 11/27/2015 02:13   Dg Knee Complete 4 Views Left  11/27/2015  CLINICAL DATA:  Status post fall  from standing, with left knee pain. Initial encounter. EXAM: LEFT KNEE - COMPLETE 4+ VIEW COMPARISON:  None. FINDINGS: There is no evidence of fracture or dislocation. There is minimal medial compartment narrowing; the patellofemoral joint is grossly unremarkable in appearance. Mild degenerative change is noted at the superior pole of the patella. No significant joint effusion is seen. The visualized soft tissues are normal in appearance. IMPRESSION: No evidence of fracture or dislocation. Electronically Signed   By: Roanna Raider M.D.   On: 11/27/2015 02:12   Dg Knee Complete 4 Views Right  11/27/2015  CLINICAL DATA:  Status post fall from standing, with right knee pain. Initial encounter. EXAM: RIGHT KNEE - COMPLETE 4+ VIEW COMPARISON:  None. FINDINGS: There is slight vertical lucency at the mid patella. Would correlate for any associated symptoms. There is mild medial compartment narrowing. The patellofemoral joint is grossly unremarkable in appearance. A small fabella is noted. No significant joint effusion is seen. The  visualized soft tissues are normal in appearance. IMPRESSION: 1. Slight vertical lucency at the mid patella. Would correlate for any associated symptoms. If there is significant concern for patellar fracture, a sunrise view could be considered for further evaluation. 2. Mild medial compartment narrowing noted. Electronically Signed   By: Roanna Raider M.D.   On: 11/27/2015 02:15   I have personally reviewed and evaluated these images and lab results as part of my medical decision-making.   EKG Interpretation None      MDM   Final diagnoses:  Fall, initial encounter  Facial abrasion, initial encounter  Knee contusion, unspecified laterality, initial encounter  Ankle sprain, right, initial encounter    Patient presents following a mechanical fall. Denies loss of consciousness. Abrasions and contusions as noted above. Otherwise nontoxic. ABCs intact.  Per [place] CT head  rules, patient is low risk for intracranial pathology. Plain films obtained of the bilateral knees and ankle. Initially films potentially concerning for patella fracture. Repeat imaging reassuring. X-ray negative of the right ankle. Suspect acute sprain. Bacitracin to abrasions. Discuss with patient supportive care at home. She'll be discharged with pain medication and ibuprofen.  After history, exam, and medical workup I feel the patient has been appropriately medically screened and is safe for discharge home. Pertinent diagnoses were discussed with the patient. Patient was given return precautions.     Shon Baton, MD 11/27/15 [phone]

## 2015-11-27 NOTE — ED Notes (Signed)
All abrasions cleansed with Saf-Clens.  Bacitracin ointment and dressings applied.

## 2015-12-10 ENCOUNTER — Ambulatory Visit: Payer: 59 | Admitting: Dietician

## 2015-12-14 ENCOUNTER — Encounter: Payer: BLUE CROSS/BLUE SHIELD | Attending: Endocrinology | Admitting: *Deleted

## 2015-12-14 VITALS — Ht 62.0 in | Wt 179.9 lb

## 2015-12-14 DIAGNOSIS — E119 Type 2 diabetes mellitus without complications: Secondary | ICD-10-CM | POA: Insufficient documentation

## 2015-12-14 DIAGNOSIS — Z713 Dietary counseling and surveillance: Secondary | ICD-10-CM | POA: Diagnosis not present

## 2015-12-14 NOTE — Patient Instructions (Signed)
Plan:  Aim for 2-3 Carb Choices per meal (30-45 grams) +/- 1 either way  Aim for 0-15 Carbs per snack if hungry  Include protein in moderation with your meals and snacks Consider reading food labels for Total Carbohydrate and Fat Grams of foods Consider  increasing your activity level by walking for 30 minutes daily as tolerated Consider checking BG at alternate times per day as directed by MD  Continue taking medication as directed by MD  Consider changing to ReliOn glucometer and testing supplies to decrease cost. Consider [facility] ReliOn brand NPH insulin if you can't afford the Lantus etc.

## 2015-12-15 ENCOUNTER — Encounter: Payer: Self-pay | Admitting: *Deleted

## 2015-12-15 NOTE — Progress Notes (Signed)
Diabetes Self-Management Education  Visit Type: First/Initial  Appt. Start Time: 1530 Appt. End Time: 1700  12/15/2015  [name], identified by name and date of birth, is a 60 y.o. female with a diagnosis of Diabetes: Type 2. Patient presents due to elevated glucose and A1c. She has recently change endocrinologists and is making medication modifcations. Shayle lives alone and has recently been laid off from her job. She is struggling financially as she has a $3500.00 deductible and has to pay out of pocket for her medications. She is weaning off the Trulicity due to cost. She is also having difficulty paying for testing supplies. I made her aware of some of the discounted options available through [facility] pharmacy. I provided information on the ReliOn brand of glucometar and testing supplies as well as the ReliOn brand of insulin. Unfortunately only the NPH and Regular are available at $25.00 a vial. Although she could afford the insulin it would require much modification in management of insulin.  ASSESSMENT  Height 5\' 2"  (1.575 m), weight 179 lb 14.4 oz (81.602 kg). Body mass index is 32.9 kg/(m^2).      Diabetes Self-Management Education - 12/14/15 1543    Visit Information   Visit Type First/Initial   Initial Visit   Diabetes Type Type 2   Are you currently following a meal plan? No   Are you taking your medications as prescribed? Yes   Health Coping   How would you rate your overall health? Fair   Psychosocial Assessment   Patient Belief/Attitude about Diabetes Defeat/Burnout   Self-care barriers None   Self-management support Doctor's office;CDE visits   Other persons present Patient   Patient Concerns Nutrition/Meal planning;Medication;Monitoring;Healthy Lifestyle;Glycemic Control   Special Needs None   Preferred Learning Style No preference indicated   Learning Readiness Change in progress   How often do you need to have someone help you when you read instructions,  pamphlets, or other written materials from your doctor or pharmacy? 2 - Rarely   Complications   Last HgB A1C per patient/outside source 9.8 %   How often do you check your blood sugar? 1-2 times/day   Fasting Blood glucose range (mg/dL) 16-109   Postprandial Blood glucose range (mg/dL) 604-540;981-191  478-295 (Holidays)   Number of hypoglycemic episodes per month 2   Can you tell when your blood sugar is low? Yes   What do you do if your blood sugar is low? sweaty, shaky, drunk   Number of hyperglycemic episodes per week 2   Can you tell when your blood sugar is high? Yes  Feels real good between 150-155mg /dl  feels sluggish when high   Have you had a dilated eye exam in the past 12 months? Yes   Have you had a dental exam in the past 12 months? Yes   Are you checking your feet? Yes   How many days per week are you checking your feet? 5   Dietary Intake   Breakfast instant oatmeal (low sugar), quinoa, vegetables with egg,   coffee, milk 8 oz 2%   Lunch tofu saute, hot sauce, spinach, green beans, 2/3C rice,    Exercise   Exercise Type Light (walking / raking leaves)   How many days per week to you exercise? 3   How many minutes per day do you exercise? 40   Total minutes per week of exercise 120   Patient Education   Previous Diabetes Education Yes (please comment)  2007   Disease  state  Definition of diabetes, type 1 and 2, and the diagnosis of diabetes;Factors that contribute to the development of diabetes   Nutrition management  Role of diet in the treatment of diabetes and the relationship between the three main macronutrients and blood glucose level;Food label reading, portion sizes and measuring food.;Carbohydrate counting;Reviewed blood glucose goals for pre and post meals and how to evaluate the patients' food intake on their blood glucose level.;Information on hints to eating out and maintain blood glucose control.;Meal options for control of blood glucose level and chronic  complications.   Physical activity and exercise  Role of exercise on diabetes management, blood pressure control and cardiac health.   Medications Reviewed patients medication for diabetes, action, purpose, timing of dose and side effects.   Monitoring Purpose and frequency of SMBG.;Identified appropriate SMBG and/or A1C goals.   Chronic complications Relationship between chronic complications and blood glucose control   Psychosocial adjustment Role of stress on diabetes   Individualized Goals (developed by patient)   Nutrition General guidelines for healthy choices and portions discussed   Physical Activity Exercise 5-7 days per week   Medications take my medication as prescribed   Monitoring  test my blood glucose as discussed   Outcomes   Expected Outcomes Demonstrated interest in learning. Expect positive outcomes   Future DMSE PRN   Program Status Completed      Individualized Plan for Diabetes Self-Management Training:   Learning Objective:  Patient will have a greater understanding of diabetes self-management. Patient education plan is to attend individual and/or group sessions per assessed needs and concerns.    Patient Instructions  Plan:  Aim for 2-3 Carb Choices per meal (30-45 grams) +/- 1 either way  Aim for 0-15 Carbs per snack if hungry  Include protein in moderation with your meals and snacks Consider reading food labels for Total Carbohydrate and Fat Grams of foods Consider  increasing your activity level by walking for 30 minutes daily as tolerated Consider checking BG at alternate times per day as directed by MD  Continue taking medication as directed by MD  Consider changing to ReliOn glucometer and testing supplies to decrease cost. Consider [facility] ReliOn brand NPH insulin if you can't afford the Lantus etc.  Expected Outcomes:  Demonstrated interest in learning. Expect positive outcomes  Education material provided: Living Well with Diabetes, A1C  conversion sheet, Meal plan card, My Plate and Snack sheet  If problems or questions, patient to contact team via:  Phone  Future DSME appointment: PRN

## 2015-12-16 ENCOUNTER — Ambulatory Visit: Payer: 59 | Admitting: Endocrinology

## 2015-12-22 ENCOUNTER — Encounter: Payer: Self-pay | Admitting: Endocrinology

## 2015-12-22 ENCOUNTER — Ambulatory Visit (INDEPENDENT_AMBULATORY_CARE_PROVIDER_SITE_OTHER): Payer: BLUE CROSS/BLUE SHIELD | Admitting: Endocrinology

## 2015-12-22 VITALS — BP 118/68 | HR 64 | Temp 98.0°F | Resp 14 | Ht 62.0 in | Wt 179.2 lb

## 2015-12-22 DIAGNOSIS — Z23 Encounter for immunization: Secondary | ICD-10-CM | POA: Diagnosis not present

## 2015-12-22 DIAGNOSIS — E119 Type 2 diabetes mellitus without complications: Secondary | ICD-10-CM | POA: Diagnosis not present

## 2015-12-22 LAB — POCT GLYCOSYLATED HEMOGLOBIN (HGB A1C): HEMOGLOBIN A1C: 8.1

## 2015-12-22 MED ORDER — INSULIN GLARGINE 100 UNIT/ML SOLOSTAR PEN
68.0000 [IU] | PEN_INJECTOR | Freq: Every morning | SUBCUTANEOUS | Status: DC
Start: 2015-12-22 — End: 2016-01-25

## 2015-12-22 NOTE — Progress Notes (Signed)
Patient ID: Natasha Chandler, female   DOB: 1956-04-07, 60 y.o.   MRN: [id number]           Reason for Appointment: F/u for Type 2 Diabetes  Referring physician: Cam Hai  History of Present Illness:          Date of diagnosis of type 2 diabetes mellitus: 2004       Background history:   She was treated with oral hypoglycemic drugs like metformin and first few years Insulin was started around 2008-9 but details are not available. She thinks she has taken Lantus for several years, at some point was also taking Humalog. Trulicity was started 1-2 years ago, taking Invokana 3 yrs  Over the last 2-3 years she has been having poor control and has had difficulty focusing on her diabetes because of family issues and periodically not having insurance  Recent history:   INSULIN regimen is: Lantus 68-71 in am      She had been on a regimen of basal insulin, Trulicity, metformin and Invokana for some time without adequate control A1c had not been checked since 6/16 Because of cost of Trulicity this was stopped on her last visit A1c is still about the same at 8.1 now  Current blood sugar patterns and problems identified:  Her blood sugars appear to be much better with her taking her Invokana and metformin regularly since her last visit  Also has been doing better with her diet with carbohydrate and portion control  She is still trying to adjust her Lantus on a daily basis based on fasting readings  She has however not checked any readings after meals especially in the evenings  Because of her recent intercurrent illnesses and other issues she has not been able to do any walking for exercise which she usually does  She is complaining about the cost of Lantus insulin  Non-insulin hypoglycemic drugs the patient is taking are: Metformin 1g bid, Invokana 300 mg daily   Side effects from medications have been:none  Compliance with the medical regimen: generally improving Hypoglycemia:  None  Glucose monitoring:  done 1-2 times a day         Glucometer: One Touch.      Blood Glucose readings by time of day and averages from meter download:  Mean values apply above for all meters except median for One Touch  PRE-MEAL Fasting Lunch Dinner Bedtime Overall  Glucose range:  85-173   102-197   114     Mean/median:      114+/-31    Self-care: The diet that the patient has been following is: tries to limit sweets.     Meal times are:  Breakfast is at Lunch: Dinner: 7 pm   Typical meal intake: Breakfast is cereal              Dietician visit, most recent: 4 years ago               Exercise: was walking previously; 2 miles    Weight history: 162-187  Wt Readings from Last 3 Encounters:  12/22/15 179 lb 3.2 oz (81.285 kg)  12/15/15 179 lb 14.4 oz (81.602 kg)  11/26/15 179 lb (81.194 kg)    Glycemic control:   Lab Results  Component Value Date   HGBA1C 8.1 12/22/2015   HGBA1C * 09/21/2010    9.8 (NOTE)  According to the ADA Clinical Practice Recommendations for 2011, when HbA1c is used as a screening test:   >=6.5%   Diagnostic of Diabetes Mellitus           (if abnormal result  is confirmed)  5.7-6.4%   Increased risk of developing Diabetes Mellitus  References:Diagnosis and Classification of Diabetes Mellitus,Diabetes Care,2011,34(Suppl 1):S62-S69 and Standards of Medical Care in         Diabetes - 2011,Diabetes Care,2011,34  (Suppl 1):S11-S61.   Lab Results  Component Value Date   CREATININE 0.60 09/21/2010         Medication List       This list is accurate as of: 12/22/15 12:44 PM.  Always use your most recent med list.               amLODipine 10 MG tablet  Commonly known as:  NORVASC  Take 10 mg by mouth daily.     ibuprofen 600 MG tablet  Commonly known as:  ADVIL,MOTRIN  Take 1 tablet (600 mg total) by mouth every 6 (six) hours as needed.     Insulin Glargine 100 UNIT/ML  Solostar Pen  Commonly known as:  BASAGLAR KWIKPEN  Inject 68 Units into the skin every morning.     INVOKANA 300 MG Tabs tablet  Generic drug:  canagliflozin  Take 300 mg by mouth daily before breakfast.     losartan 100 MG tablet  Commonly known as:  COZAAR  Take 100 mg by mouth daily.     METFORMIN HCL PO  Take 500 mg by mouth 2 (two) times daily.     simvastatin 20 MG tablet  Commonly known as:  ZOCOR  Take 20 mg by mouth daily.     sodium chloride 0.9 % SOLN 100 mL with insulin regular 100 units/mL SOLN 1 Units/mL  Inject into the vein continuous.     TRULICITY 1.5 MG/0.5ML Sopn  Generic drug:  Dulaglutide  Inject 1.5 mg into the skin once a week. Reported on 12/22/2015        Allergies:  Allergies  Allergen Reactions  . Penicillins   . Sulfa Antibiotics     Past Medical History  Diagnosis Date  . Asthma   . Hypertension   . Diabetes mellitus   . Hyperlipidemia   . Vitamin D deficiency   . Thyroid disease     Small goiter, stable    Past Surgical History  Procedure Laterality Date  . Tonsillectomy      Family History  Problem Relation Age of Onset  . Diabetes Mother   . Diabetes Father   . Diabetes Maternal Aunt     Social History:  reports that she does not drink alcohol. Her tobacco and drug histories are not on file.    Review of Systems    Lipid history:    No results found for: CHOL, HDL, LDLCALC, LDLDIRECT, TRIG, CHOLHDL         Hypertension: Treated with amlodipine and losartan  Most recent eye exam was 7/16  Most recent foot exam: 12/16  Review of Systems Thyroid: She has had a goiter since at least 2013 Has only one nodule 1.6 cm to the left of the isthmus which has been stable as of the last ultrasound in 2015   LABS:  Office Visit on 12/22/2015  Component Date Value Ref Range Status  . Hemoglobin A1C 12/22/2015 8.1   Final    Physical Examination:  BP 118/68 mmHg  Pulse  64  Temp(Src) 98 F (36.7 C)  Resp 14   Ht  (1.575 m)  Wt 179 lb 3.2 oz (81.285 kg)  BMI 32.77 kg/m2  SpO2 96%       ASSESSMENT:  Diabetes type 2, uncontrolled with BMI 33 She has had long-standing diabetes with inadequate control More recently she has been very compliant with all her self-care measures and blood sugars are looking fairly good However she is not checking blood sugars after meals and not clear if she still has postprandial hyperglycemia especially after supper She does not appear to have higher readings with stopping Trulicity and is taking about the same amount of insulin  Her main problems today are related to affording her medications and test strips   PLAN:    Since she is not able to afford Lantus she will try Basaglar.  Given her the application to get the medication through [email or web address] She will try not to adjust her Lantus on a daily basis  More readings after meals and consider using generic regular insulin if needed for mealtime control  Start exercising with walking  Review blood sugars in 2 months  Patient Instructions  Stay on 68 Lantus daily keep sugar in range below  Check blood sugars on waking up 3-4  times a week Also check blood sugars about 2 hours after a meal and do this after different meals by rotation  Recommended blood sugar levels on waking up is 90-130 and about 2 hours after meal is 130-160 including after dinner  Please bring your blood sugar monitor to each visit, thank you     [facility] 12/22/2015, 12:44 PM   Note: This office note was prepared with Dragon voice recognition system technology. Any transcriptional errors that result from this process are unintentional.

## 2015-12-22 NOTE — Patient Instructions (Signed)
Stay on 68 Lantus daily keep sugar in range below  Check blood sugars on waking up 3-4  times a week Also check blood sugars about 2 hours after a meal and do this after different meals by rotation  Recommended blood sugar levels on waking up is 90-130 and about 2 hours after meal is 130-160 including after dinner  Please bring your blood sugar monitor to each visit, thank you

## 2016-01-25 ENCOUNTER — Other Ambulatory Visit: Payer: Self-pay | Admitting: *Deleted

## 2016-01-25 ENCOUNTER — Telehealth: Payer: Self-pay | Admitting: Endocrinology

## 2016-01-25 MED ORDER — CANAGLIFLOZIN 300 MG PO TABS: 300.0000 mg | ORAL_TABLET | Freq: Every day | ORAL | Status: DC

## 2016-01-25 MED ORDER — INSULIN GLARGINE 100 UNIT/ML SOLOSTAR PEN: 68.0000 [IU] | PEN_INJECTOR | Freq: Every morning | SUBCUTANEOUS | Status: DC

## 2016-01-25 MED ORDER — METFORMIN HCL 500 MG PO TABS: 500.0000 mg | ORAL_TABLET | Freq: Two times a day (BID) | ORAL | Status: DC

## 2016-01-25 NOTE — Telephone Encounter (Signed)
rx's sent to [facility]

## 2016-01-25 NOTE — Telephone Encounter (Signed)
Patient called stating that she would like Rx refills   Rx: Lantus Pen  Metformin  Invokana   Pharmacy: [facility]    Thank you

## 2016-01-27 ENCOUNTER — Telehealth: Payer: Self-pay | Admitting: Endocrinology

## 2016-01-27 NOTE — Telephone Encounter (Signed)
Left message for patient to return phone call.  

## 2016-01-27 NOTE — Telephone Encounter (Addendum)
Patient called stating that Dr. Lucianne Muss will have to put her back on Lantus the Basaglar at this time is too expensive  Also, she noticed that her metformin dosage is not correct    Please advise patient   Thank you

## 2016-01-28 ENCOUNTER — Telehealth: Payer: Self-pay | Admitting: Endocrinology

## 2016-01-28 MED ORDER — METFORMIN HCL 500 MG PO TABS: 1000.0000 mg | ORAL_TABLET | Freq: Two times a day (BID) | ORAL | Status: DC

## 2016-01-28 NOTE — Telephone Encounter (Signed)
PT states she takes metformin two tablets bid. Ok to adjust sig and send? Also script for lantus ? [facility] is pharmacy

## 2016-01-28 NOTE — Telephone Encounter (Signed)
Pt requesting call back about her Metformin dosage being incorrect.

## 2016-01-28 NOTE — Telephone Encounter (Signed)
She can take 1000 mg metformin twice a day She was told to check the cost of Basaglar from [email or web address] If she does not want to do this she can continue Lantus

## 2016-01-28 NOTE — Telephone Encounter (Signed)
If we can change the rx for the metformin to 2 500 mg tabs daily it would be cheap enough

## 2016-01-28 NOTE — Telephone Encounter (Signed)
LVM for pt to call back as soon as possible.   Gave both number pt could call back.

## 2016-01-31 NOTE — Telephone Encounter (Signed)
Please see below and advise if okay to switch?

## 2016-01-31 NOTE — Telephone Encounter (Signed)
She needs to be on 4 tablets of metformin ER 500

## 2016-02-02 ENCOUNTER — Other Ambulatory Visit: Payer: Self-pay | Admitting: *Deleted

## 2016-02-02 MED ORDER — METFORMIN HCL ER 500 MG PO TB24: ORAL_TABLET | ORAL | Status: DC

## 2016-02-02 NOTE — Telephone Encounter (Signed)
Noted, she was aware, but the last rx was sent in for 1 tablet daily only.  Corrected prescription and sent in to [facility].

## 2016-02-15 ENCOUNTER — Other Ambulatory Visit (INDEPENDENT_AMBULATORY_CARE_PROVIDER_SITE_OTHER): Payer: BLUE CROSS/BLUE SHIELD

## 2016-02-15 DIAGNOSIS — E119 Type 2 diabetes mellitus without complications: Secondary | ICD-10-CM | POA: Diagnosis not present

## 2016-02-15 LAB — BASIC METABOLIC PANEL
BUN: 14 mg/dL (ref 6–23)
CALCIUM: 9.7 mg/dL (ref 8.4–10.5)
CO2: 29 meq/L (ref 19–32)
Chloride: 103 mEq/L (ref 96–112)
Creatinine, Ser: 0.61 mg/dL (ref 0.40–1.20)
GFR: 106.58 mL/min (ref >60.00)
Glucose, Bld: 104 mg/dL — ABNORMAL HIGH (ref 70–99)
Potassium: 4.1 mEq/L (ref 3.5–5.1)
SODIUM: 141 meq/L (ref 135–145)

## 2016-02-15 LAB — HEMOGLOBIN A1C: HEMOGLOBIN A1C: 7.6 % — AB (ref 4.6–6.5)

## 2016-02-17 ENCOUNTER — Encounter: Payer: Self-pay | Admitting: *Deleted

## 2016-02-17 ENCOUNTER — Ambulatory Visit (INDEPENDENT_AMBULATORY_CARE_PROVIDER_SITE_OTHER): Payer: BLUE CROSS/BLUE SHIELD | Admitting: Endocrinology

## 2016-02-17 ENCOUNTER — Encounter: Payer: Self-pay | Admitting: Endocrinology

## 2016-02-17 ENCOUNTER — Ambulatory Visit: Payer: BLUE CROSS/BLUE SHIELD | Admitting: Endocrinology

## 2016-02-17 VITALS — BP 118/74 | HR 83 | Temp 98.2°F | Resp 14 | Ht 62.0 in | Wt 182.6 lb

## 2016-02-17 DIAGNOSIS — E7800 Pure hypercholesterolemia, unspecified: Secondary | ICD-10-CM | POA: Diagnosis not present

## 2016-02-17 DIAGNOSIS — Z794 Long term (current) use of insulin: Secondary | ICD-10-CM | POA: Diagnosis not present

## 2016-02-17 DIAGNOSIS — E1165 Type 2 diabetes mellitus with hyperglycemia: Secondary | ICD-10-CM | POA: Diagnosis not present

## 2016-02-17 NOTE — Patient Instructions (Addendum)
Take lantus at supper 70 units  Check blood sugars on waking up 3  times a week Also check blood sugars about 2 hours after a meal and do this after different meals by rotation  Recommended blood sugar levels on waking up is 90-130 and about 2 hours after meal is 130-170  Please bring your blood sugar monitor to each visit, thank you   Take 2-3 Metformin Er daily

## 2016-02-17 NOTE — Progress Notes (Signed)
Patient ID: Natasha Chandler, female   DOB: 04-20-1956, 60 y.o.   MRN: [id number]           Reason for Appointment: F/u for Type 2 Diabetes  Referring physician: Cam Hai  History of Present Illness:          Date of diagnosis of type 2 diabetes mellitus: 2004       Background history:   She was treated with oral hypoglycemic drugs like metformin and first few years Insulin was started around 2008-9 but details are not available. She thinks she has taken Lantus for several years, at some point was also taking Humalog. Trulicity was started 1-2 years ago, taking Invokana 3 yrs  Over the last 2-3 years she has been having poor control and has had difficulty focusing on her diabetes because of family issues and periodically not having insurance  Recent history:   INSULIN regimen is: Lantus 72 in am    She had been on a regimen of basal insulin, Trulicity, metformin and Invokana for some time without adequate control   Because of cost of Trulicity this was stopped   A1c is somewhat better at 7.6   Current blood sugar patterns and problems identified:  She did not bring her monitor for download today  Although she thinks her fasting readings are fairly good she does not remember most of her readings, fasting glucose in the lab was 104  She thinks her blood sugars are usually high after supper but not over 200 and also high after breakfast today  She is taking her Lantus in the mornings but today reduced her dose on her own for fear of hypoglycemia She has not been active and also traveling much more recently, possibly not consistent with diet but eating out  Still benefiting from taking her Invokana and metformin regularly  Non-insulin hypoglycemic drugs the patient is taking are: Metformin 1g bid, Invokana 300 mg daily   Side effects from medications have been:none  Compliance with the medical regimen: generally improving Hypoglycemia: None  Glucose monitoring:  done 1-2  times a day         Glucometer: One Touch.      Blood Glucose readings by time of day and averages from recall  Mean values apply above for all meters except median for One Touch  PRE-MEAL Fasting Lunch Dinner Bedtime Overall  Glucose range: 119 60     Mean/median:        POST-MEAL PC Breakfast PC Lunch PC Dinner  Glucose range: 195  180-190  Mean/median:       Self-care: The diet that the patient has been following is: tries to limit sweets.     Meal times are: Dinner: 7 pm   Typical meal intake: Breakfast is cereal or eggs             Dietician visit, most recent: 4 years ago               Exercise: less now   Weight history: 162-187  Wt Readings from Last 3 Encounters:  02/17/16 182 lb 9.6 oz (82.827 kg)  12/22/15 179 lb 3.2 oz (81.285 kg)  12/15/15 179 lb 14.4 oz (81.602 kg)    Glycemic control:   Lab Results  Component Value Date   HGBA1C 7.6* 02/15/2016   HGBA1C 8.1 12/22/2015   HGBA1C * 09/21/2010    9.8 (NOTE)  According to the ADA Clinical Practice Recommendations for 2011, when HbA1c is used as a screening test:   >=6.5%   Diagnostic of Diabetes Mellitus           (if abnormal result  is confirmed)  5.7-6.4%   Increased risk of developing Diabetes Mellitus  References:Diagnosis and Classification of Diabetes Mellitus,Diabetes Care,2011,34(Suppl 1):S62-S69 and Standards of Medical Care in         Diabetes - 2011,Diabetes Care,2011,34  (Suppl 1):S11-S61.   Lab Results  Component Value Date   CREATININE 0.61 02/15/2016   Lab on 02/15/2016  Component Date Value Ref Range Status  . Hgb A1c MFr Bld 02/15/2016 7.6* 4.6 - 6.5 % Final   Glycemic Control Guidelines for People with Diabetes:Non Diabetic:  <6%Goal of Therapy: <7%Additional Action Suggested:  >8%   . Sodium 02/15/2016 141  135 - 145 mEq/L Final  . Potassium 02/15/2016 4.1  3.5 - 5.1 mEq/L Final  . Chloride 02/15/2016 103  96 - 112  mEq/L Final  . CO2 02/15/2016 29  19 - 32 mEq/L Final  . Glucose, Bld 02/15/2016 104* 70 - 99 mg/dL Final  . BUN 40/98/119103/14/2017 14  6 - 23 mg/dL Final  . Creatinine, Ser 02/15/2016 0.61  0.40 - 1.20 mg/dL Final  . Calcium 47/82/956203/14/2017 9.7  8.4 - 10.5 mg/dL Final  . GFR 13/08/657803/14/2017 106.58  >60.00 mL/min Final         Medication List       This list is accurate as of: 02/17/16 11:59 PM.  Always use your most recent med list.               amLODipine 10 MG tablet  Commonly known as:  NORVASC  Take 10 mg by mouth daily.     canagliflozin 300 MG Tabs tablet  Commonly known as:  INVOKANA  Take 1 tablet (300 mg total) by mouth daily before breakfast.     ibuprofen 600 MG tablet  Commonly known as:  ADVIL,MOTRIN  Take 1 tablet (600 mg total) by mouth every 6 (six) hours as needed.     losartan 100 MG tablet  Commonly known as:  COZAAR  Take 100 mg by mouth daily.     metFORMIN 500 MG 24 hr tablet  Commonly known as:  GLUCOPHAGE XR  Take 2 tablets by mouth twice a day     simvastatin 20 MG tablet  Commonly known as:  ZOCOR  Take 20 mg by mouth daily.     sodium chloride 0.9 % SOLN 100 mL with insulin regular 100 units/mL SOLN 1 Units/mL  Inject into the vein continuous.        Allergies:  Allergies  Allergen Reactions  . Penicillins   . Sulfa Antibiotics     Past Medical History  Diagnosis Date  . Asthma   . Hypertension   . Diabetes mellitus   . Hyperlipidemia   . Vitamin D deficiency   . Thyroid disease     Small goiter, stable    Past Surgical History  Procedure Laterality Date  . Tonsillectomy      Family History  Problem Relation Age of Onset  . Diabetes Mother   . Diabetes Father   . Diabetes Maternal Aunt     Social History:  reports that she does not drink alcohol. Her tobacco and drug histories are not on file.    Review of Systems    Lipid history:    No results found for: CHOL,  HDL, LDLCALC, LDLDIRECT, TRIG, CHOLHDL           Hypertension: Treated with amlodipine and losartan  Most recent eye exam was 7/16  Most recent foot exam: 12/16  Review of Systems Thyroid: She has had a goiter since at least 2013 Has only one nodule 1.6 cm to the left of the isthmus which has been stable as of the last ultrasound in 2015   LABS:  Lab on 02/15/2016  Component Date Value Ref Range Status  . Hgb A1c MFr Bld 02/15/2016 7.6* 4.6 - 6.5 % Final   Glycemic Control Guidelines for People with Diabetes:Non Diabetic:  <6%Goal of Therapy: <7%Additional Action Suggested:  >8%   . Sodium 02/15/2016 141  135 - 145 mEq/L Final  . Potassium 02/15/2016 4.1  3.5 - 5.1 mEq/L Final  . Chloride 02/15/2016 103  96 - 112 mEq/L Final  . CO2 02/15/2016 29  19 - 32 mEq/L Final  . Glucose, Bld 02/15/2016 104* 70 - 99 mg/dL Final  . BUN 62/95/2841 14  6 - 23 mg/dL Final  . Creatinine, Ser 02/15/2016 0.61  0.40 - 1.20 mg/dL Final  . Calcium 32/44/0102 9.7  8.4 - 10.5 mg/dL Final  . GFR 72/53/6644 106.58  >60.00 mL/min Final    Physical Examination:  BP 118/74 mmHg  Pulse 83  Temp(Src) 98.2 F (36.8 C)  Resp 14  Ht  (1.575 m)  Wt 182 lb 9.6 oz (82.827 kg)  BMI 33.39 kg/m2  SpO2 97%       ASSESSMENT:  Diabetes type 2, uncontrolled with BMI 33 See history of present illness for detailed discussion of current diabetes management, blood sugar patterns and problems identified  She is currently on basal insulin along with Invokana and metformin She thinks she does tend to have intolerance to metformin with diarrhea but has not tried more than 1000 mg of metformin ER so far Renal function normal, continues to take Invokana She does appear to be having relatively high postprandial readings as discussed above A1c is relatively better at 7.6 but discussed needing to get it down to under 7% More recently has not been able to be consistent with exercise and also eating out more often   PLAN:    Since she is having higher  readings after supper and occasional tendency to low sugars before lunch 60 and try to take her Lantus at suppertime instead of breakfast using 70 units consistently  Consider adding Humalog at suppertime if postprandial readings continue to be high  Checking blood sugars mostly in the morning and advised her to do more readings after meals and bring her monitor for the office consistently for download  Start exercising with walking  Continue metformin but may try to take 3 tablets if tolerated using 1 in the morning and 2 in the evening  Review blood sugars in 2 months  Patient Instructions  Take lantus at supper 70 units  Check blood sugars on waking up 3  times a week Also check blood sugars about 2 hours after a meal and do this after different meals by rotation  Recommended blood sugar levels on waking up is 90-130 and about 2 hours after meal is 130-170  Please bring your blood sugar monitor to each visit, thank you   Take 2-3 Metformin Er daily  Counseling time on subjects discussed above is over 50% of today's 25 minute visit   Makyiah Lie 02/18/2016, 10:06 AM   Note: This office note was prepared  with Estate agent. Any transcriptional errors that result from this process are unintentional.

## 2016-02-18 DIAGNOSIS — E7800 Pure hypercholesterolemia, unspecified: Secondary | ICD-10-CM | POA: Insufficient documentation

## 2016-04-21 ENCOUNTER — Other Ambulatory Visit: Payer: BLUE CROSS/BLUE SHIELD

## 2016-04-26 ENCOUNTER — Ambulatory Visit: Payer: BLUE CROSS/BLUE SHIELD | Admitting: Endocrinology

## 2016-05-02 ENCOUNTER — Other Ambulatory Visit: Payer: Self-pay | Admitting: Endocrinology

## 2016-05-02 NOTE — Telephone Encounter (Signed)
PT [facility]

## 2016-05-04 ENCOUNTER — Other Ambulatory Visit (INDEPENDENT_AMBULATORY_CARE_PROVIDER_SITE_OTHER): Payer: BLUE CROSS/BLUE SHIELD

## 2016-05-04 DIAGNOSIS — Z794 Long term (current) use of insulin: Secondary | ICD-10-CM | POA: Diagnosis not present

## 2016-05-04 DIAGNOSIS — E1165 Type 2 diabetes mellitus with hyperglycemia: Secondary | ICD-10-CM | POA: Diagnosis not present

## 2016-05-04 LAB — COMPREHENSIVE METABOLIC PANEL
ALT: 24 U/L (ref 0–35)
AST: 24 U/L (ref 0–37)
Albumin: 4.3 g/dL (ref 3.5–5.2)
Alkaline Phosphatase: 62 U/L (ref 39–117)
BUN: 14 mg/dL (ref 6–23)
CO2: 29 mEq/L (ref 19–32)
Calcium: 9.6 mg/dL (ref 8.4–10.5)
Chloride: 103 mEq/L (ref 96–112)
Creatinine, Ser: 0.61 mg/dL (ref 0.40–1.20)
GFR: 106.5 mL/min (ref >60.00)
Glucose, Bld: 97 mg/dL (ref 70–99)
Potassium: 3.7 mEq/L (ref 3.5–5.1)
Sodium: 139 mEq/L (ref 135–145)
Total Bilirubin: 0.5 mg/dL (ref 0.2–1.2)
Total Protein: 6.9 g/dL (ref 6.0–8.3)

## 2016-05-04 LAB — LIPID PANEL
CHOLESTEROL: 158 mg/dL (ref 0–200)
HDL: 33.4 mg/dL — AB (ref >39.00)
LDL Cholesterol: 103 mg/dL — ABNORMAL HIGH (ref 0–99)
NonHDL: 125.06
TRIGLYCERIDES: 109 mg/dL (ref 0.0–149.0)
Total CHOL/HDL Ratio: 5
VLDL: 21.8 mg/dL (ref 0.0–40.0)

## 2016-05-04 LAB — GLUCOSE, RANDOM: Glucose, Bld: 97 mg/dL (ref 70–99)

## 2016-05-04 LAB — HEMOGLOBIN A1C: HEMOGLOBIN A1C: 8.7 % — AB (ref 4.6–6.5)

## 2016-05-04 LAB — MICROALBUMIN / CREATININE URINE RATIO
CREATININE, U: 178.5 mg/dL
MICROALB/CREAT RATIO: 0.6 mg/g (ref 0.0–30.0)
Microalb, Ur: 1.1 mg/dL (ref 0.0–1.9)

## 2016-05-08 ENCOUNTER — Ambulatory Visit (INDEPENDENT_AMBULATORY_CARE_PROVIDER_SITE_OTHER): Payer: BLUE CROSS/BLUE SHIELD | Admitting: Endocrinology

## 2016-05-08 ENCOUNTER — Encounter: Payer: Self-pay | Admitting: Endocrinology

## 2016-05-08 VITALS — BP 120/76 | HR 78 | Temp 98.0°F | Resp 16 | Ht 62.0 in | Wt 183.0 lb

## 2016-05-08 DIAGNOSIS — E1165 Type 2 diabetes mellitus with hyperglycemia: Secondary | ICD-10-CM | POA: Diagnosis not present

## 2016-05-08 DIAGNOSIS — E042 Nontoxic multinodular goiter: Secondary | ICD-10-CM | POA: Diagnosis not present

## 2016-05-08 DIAGNOSIS — R1310 Dysphagia, unspecified: Secondary | ICD-10-CM

## 2016-05-08 DIAGNOSIS — Z794 Long term (current) use of insulin: Secondary | ICD-10-CM

## 2016-05-08 NOTE — Progress Notes (Signed)
Patient ID: Natasha Chandler, female   DOB: [birth year], 60 y.o.   MRN: [id number]           Reason for Appointment: F/u for Type 2 Diabetes  Referring physician: Cam Hai  History of Present Illness:          Date of diagnosis of type 2 diabetes mellitus: 2004       Background history:   She was treated with oral hypoglycemic drugs like metformin and first few years Insulin was started around 2008-9 but details are not available. She thinks she has taken Lantus for several years, at some point was also taking Humalog. Trulicity was started 1-2 years ago, taking Invokana 3 yrs  Over the last 2-3 years she has been having poor control and has had difficulty focusing on her diabetes because of family issues and periodically not having insurance  Recent history:   INSULIN regimen is: Lantus 68-72 in am   Non-insulin hypoglycemic drugs the patient is taking are: Metformin ER 1g bid, Invokana 300 mg daily  She had been on a regimen of basal insulin, metformin and Invokana for some time without adequate control   A1c is unusually high at 8.7 , Previously wasbetter at 7.6   Current blood sugar patterns and problems identified:   She thinks her blood sugars were higher a month or so ago and she increased her Lantus by 4 units about a week ago  She did bring her monitor for download today  She is still checking her blood sugars sporadically especially after meals  Unable to lose weight  She says that she has started walking only about a couple of weeks ago and was not able to do this before   She is taking her Lantus in the mornings  again, was told to try it at night but she thinks this caused some nocturnal hypoglycemia  More recently with her higher Lantus dose fasting readings are improved She thinks her sugars are better when she is walking more Previously was on Trulicity but stopped this because of the cost    Side effects from medications have been:none  Compliance  with the medical regimen: generally good Hypoglycemia: None  Glucose monitoring:  done 1-2 times a day         Glucometer: One Touch.      Blood Glucose readings by time of day and averages from   Mean values apply above for all meters except median for One Touch  PRE-MEAL Fasting Lunch Dinner Bedtime Overall  Glucose range: 87-187 143  94-245   Mean/median: 144     138   POST-MEAL PC Breakfast PC Lunch PC Dinner  Glucose range: 112-174     Mean/median:       Self-care: The diet that the patient has been following is: tries to limit sweets.     Meal times are: Dinner: 7 pm   Typical meal intake: Breakfast is cereal or eggs             Dietician visit, most recent: 4 years ago               Exercise: walking 2-3 miles, Recently for the last  2 weeks  Weight history: 162-187  Wt Readings from Last 3 Encounters:  05/08/16 183 lb (83.008 kg)  02/17/16 182 lb 9.6 oz (82.827 kg)  12/22/15 179 lb 3.2 oz (81.285 kg)    Glycemic control:   Lab Results  Component Value Date   HGBA1C  8.7* 05/04/2016   HGBA1C 7.6* 02/15/2016   HGBA1C 8.1 12/22/2015   Lab Results  Component Value Date   MICROALBUR 1.1 05/04/2016   LDLCALC 103* 05/04/2016   CREATININE 0.61 05/04/2016   Lab on 05/04/2016  Component Date Value Ref Range Status  . Hgb A1c MFr Bld 05/04/2016 8.7* 4.6 - 6.5 % Final   Glycemic Control Guidelines for People with Diabetes:Non Diabetic:  <6%Goal of Therapy: <7%Additional Action Suggested:  >8%   . Glucose, Bld 05/04/2016 97  70 - 99 mg/dL Final  . Sodium 64/33/2951 139  135 - 145 mEq/L Final  . Potassium 05/04/2016 3.7  3.5 - 5.1 mEq/L Final  . Chloride 05/04/2016 103  96 - 112 mEq/L Final  . CO2 05/04/2016 29  19 - 32 mEq/L Final  . Glucose, Bld 05/04/2016 97  70 - 99 mg/dL Final  . BUN 88/41/6606 14  6 - 23 mg/dL Final  . Creatinine, Ser 05/04/2016 0.61  0.40 - 1.20 mg/dL Final  . Total Bilirubin 05/04/2016 0.5  0.2 - 1.2 mg/dL Final  . Alkaline Phosphatase  05/04/2016 62  39 - 117 U/L Final  . AST 05/04/2016 24  0 - 37 U/L Final  . ALT 05/04/2016 24  0 - 35 U/L Final  . Total Protein 05/04/2016 6.9  6.0 - 8.3 g/dL Final  . Albumin 30/16/0109 4.3  3.5 - 5.2 g/dL Final  . Calcium 32/35/5732 9.6  8.4 - 10.5 mg/dL Final  . GFR 20/25/4270 106.50  >60.00 mL/min Final  . Cholesterol 05/04/2016 158  0 - 200 mg/dL Final   ATP III Classification       Desirable:  < 200 mg/dL               Borderline High:  200 - 239 mg/dL          High:  > = 623 mg/dL  . Triglycerides 05/04/2016 109.0  0.0 - 149.0 mg/dL Final   Normal:  <762 mg/dLBorderline High:  150 - 199 mg/dL  . HDL 05/04/2016 33.40* >39.00 mg/dL Final  . VLDL 83/15/1761 21.8  0.0 - 40.0 mg/dL Final  . LDL Cholesterol 05/04/2016 103* 0 - 99 mg/dL Final  . Total CHOL/HDL Ratio 05/04/2016 5   Final                  Men          Women1/2 Average Risk     3.4          3.3Average Risk          5.0          4.42X Average Risk          9.6          7.13X Average Risk          15.0          11.0                      . NonHDL 05/04/2016 125.06   Final   NOTE:  Non-HDL goal should be 30 mg/dL higher than patient's LDL goal (i.e. LDL goal of < 70 mg/dL, would have non-HDL goal of < 100 mg/dL)  . Microalb, Ur 05/04/2016 1.1  0.0 - 1.9 mg/dL Final  . Creatinine,U 60/73/7106 178.5   Final  . Microalb Creat Ratio 05/04/2016 0.6  0.0 - 30.0 mg/g Final         Medication List  This list is accurate as of: 05/08/16  7:31 PM.  Always use your most recent med list.               amLODipine 10 MG tablet  Commonly known as:  NORVASC  Take 10 mg by mouth daily.     canagliflozin 300 MG Tabs tablet  Commonly known as:  INVOKANA  Take 1 tablet (300 mg total) by mouth daily before breakfast.     ibuprofen 600 MG tablet  Commonly known as:  ADVIL,MOTRIN  Take 1 tablet (600 mg total) by mouth every 6 (six) hours as needed.     LANTUS SOLOSTAR 100 UNIT/ML Solostar Pen  Generic drug:  Insulin Glargine    INJECT 68 UNITS INTO THE SKIN EVERY MORNING.     losartan 100 MG tablet  Commonly known as:  COZAAR  Take 100 mg by mouth daily.     metFORMIN 500 MG 24 hr tablet  Commonly known as:  GLUCOPHAGE XR  Take 2 tablets by mouth twice a day     simvastatin 20 MG tablet  Commonly known as:  ZOCOR  Take 20 mg by mouth daily.     sodium chloride 0.9 % SOLN 100 mL with insulin regular 100 units/mL SOLN 1 Units/mL  Inject into the vein continuous.        Allergies:  Allergies  Allergen Reactions  . Penicillins   . Sulfa Antibiotics     Past Medical History  Diagnosis Date  . Asthma   . Hypertension   . Diabetes mellitus   . Hyperlipidemia   . Vitamin D deficiency   . Thyroid disease     Small goiter, stable    Past Surgical History  Procedure Laterality Date  . Tonsillectomy      Family History  Problem Relation Age of Onset  . Diabetes Mother   . Diabetes Father   . Diabetes Maternal Aunt     Social History:  reports that she does not drink alcohol. Her tobacco and drug histories are not on file.    Review of Systems    Lipid history: Has low HDL and borderline LDL, getting simvastatin from PCP     Lab Results  Component Value Date   CHOL 158 05/04/2016   HDL 33.40* 05/04/2016   LDLCALC 103* 05/04/2016   TRIG 109.0 05/04/2016   CHOLHDL 5 05/04/2016           Hypertension: Treated with amlodipine and losartan, Also benefiting from Invokana   Most recent eye exam was 7/16  Most recent foot exam: 12/16  Review of Systems  She is asking today about more recent occasional difficulty swallowing, also having some pharyngeal secretions at times  Thyroid: She has had a goiter since at least 2013 Has only one nodule 1.6 cm to the left of the isthmus which has been stable as of the last ultrasound in 2015   LABS:  Lab on 05/04/2016  Component Date Value Ref Range Status  . Hgb A1c MFr Bld 05/04/2016 8.7* 4.6 - 6.5 % Final   Glycemic Control  Guidelines for People with Diabetes:Non Diabetic:  <6%Goal of Therapy: <7%Additional Action Suggested:  >8%   . Glucose, Bld 05/04/2016 97  70 - 99 mg/dL Final  . Sodium 69/62/9528 139  135 - 145 mEq/L Final  . Potassium 05/04/2016 3.7  3.5 - 5.1 mEq/L Final  . Chloride 05/04/2016 103  96 - 112 mEq/L Final  . CO2 05/04/2016 29  19 -  32 mEq/L Final  . Glucose, Bld 05/04/2016 97  70 - 99 mg/dL Final  . BUN 16/10/960406/12/2015 14  6 - 23 mg/dL Final  . Creatinine, Ser 05/04/2016 0.61  0.40 - 1.20 mg/dL Final  . Total Bilirubin 05/04/2016 0.5  0.2 - 1.2 mg/dL Final  . Alkaline Phosphatase 05/04/2016 62  39 - 117 U/L Final  . AST 05/04/2016 24  0 - 37 U/L Final  . ALT 05/04/2016 24  0 - 35 U/L Final  . Total Protein 05/04/2016 6.9  6.0 - 8.3 g/dL Final  . Albumin 54/09/811906/12/2015 4.3  3.5 - 5.2 g/dL Final  . Calcium 14/78/295606/12/2015 9.6  8.4 - 10.5 mg/dL Final  . GFR 21/30/865706/12/2015 106.50  >60.00 mL/min Final  . Cholesterol 05/04/2016 158  0 - 200 mg/dL Final   ATP III Classification       Desirable:  < 200 mg/dL               Borderline High:  200 - 239 mg/dL          High:  > = 846240 mg/dL  . Triglycerides 05/04/2016 109.0  0.0 - 149.0 mg/dL Final   Normal:  <962<150 mg/dLBorderline High:  150 - 199 mg/dL  . HDL 05/04/2016 33.40* >39.00 mg/dL Final  . VLDL 95/28/413206/12/2015 21.8  0.0 - 40.0 mg/dL Final  . LDL Cholesterol 05/04/2016 103* 0 - 99 mg/dL Final  . Total CHOL/HDL Ratio 05/04/2016 5   Final                  Men          Women1/2 Average Risk     3.4          3.3Average Risk          5.0          4.42X Average Risk          9.6          7.13X Average Risk          15.0          11.0                      . NonHDL 05/04/2016 125.06   Final   NOTE:  Non-HDL goal should be 30 mg/dL higher than patient's LDL goal (i.e. LDL goal of < 70 mg/dL, would have non-HDL goal of < 100 mg/dL)  . Microalb, Ur 05/04/2016 1.1  0.0 - 1.9 mg/dL Final  . Creatinine,U 44/01/027206/12/2015 178.5   Final  . Microalb Creat Ratio 05/04/2016 0.6  0.0 -  30.0 mg/g Final    Physical Examination:  BP 120/76 mmHg  Pulse 78  Temp(Src) 98 F (36.7 C)  Resp 16  Ht 5\' 2"  (1.575 m)  Wt 183 lb (83.008 kg)  BMI 33.46 kg/m2  SpO2 97%   Her thyroid is enlarged bilaterally, firm to soft and somewhat nodular, about 3 times normal on the left and 2 times normal on the right  No local lymphadenopathy    ASSESSMENT:  Diabetes type 2, uncontrolled with BMI 33 See history of present illness for detailed discussion of current diabetes management, blood sugar patterns and problems identified  She is currently on basal insulin along with Invokana and metformin Her A1c is unusually high at 8.7 She does not check enough readings after meals and may have had high postprandial readings Fasting readings are relatively better recently, highest in last month  only 187 She also did tend to have low readings overnight but p.m. dosing of Lantus, indicating inconsistent 24 hour effect line our She does need more weight loss and only recently starting to do a walking program  GOITER: Not clear this is enlarged further from before, she is having some occasional dysphagia and probably needs reassessment of the size of the goiter  Also needs updated TSH  PLAN:    More consistent glucose monitoring especially after meals  She will switch her prescription to [place] when finished with Lantus, most likely may need less insulin with this  Consider continuous glucose monitoring analysis of her blood sugar patterns if her A1c stays persistently high  Consistent exercising with walking  Continue metformin ER but may try to take 3 tablets if eating foods like salads that causes diarrhea  Review blood sugars in 2 months again Repeat thyroid ultrasound to be ordered  Patient Instructions  Call when finishing Lantus, to change to [place] 68 units daily  Check blood sugars on waking up  3-4 times a week Also check blood sugars about 2 hours after a meal and do  this after different meals by rotation  Recommended blood sugar levels on waking up is 90-130 and about 2 hours after meal is 130-160  Please bring your blood sugar monitor to each visit, thank you  Metformin 3 on days eating salads   Counseling time on subjects discussed above is over 50% of today's 25 minute visit   Kherington Meraz 05/08/2016, 7:31 PM   Note: This office note was prepared with Dragon voice recognition system technology. Any transcriptional errors that result from this process are unintentional.

## 2016-05-08 NOTE — Patient Instructions (Addendum)
Call when finishing Lantus, to change to [place]res[place] 68 units daily  Check blood sugars on waking up  3-4 times a week Also check blood sugars about 2 hours after a meal and do this after different meals by rotation  Recommended blood sugar levels on waking up is 90-130 and about 2 hours after meal is 130-160  Please bring your blood sugar monitor to each visit, thank you  Metformin 3 on days eating salads

## 2016-05-16 ENCOUNTER — Ambulatory Visit
Admission: RE | Admit: 2016-05-16 | Discharge: 2016-05-16 | Disposition: A | Discharge status: Home / Self Care | Payer: BLUE CROSS/BLUE SHIELD | Source: Ambulatory Visit | Attending: Endocrinology | Admitting: Endocrinology

## 2016-05-16 DIAGNOSIS — E042 Nontoxic multinodular goiter: Secondary | ICD-10-CM

## 2016-05-19 ENCOUNTER — Other Ambulatory Visit: Payer: Self-pay | Admitting: Endocrinology

## 2016-05-19 DIAGNOSIS — E042 Nontoxic multinodular goiter: Secondary | ICD-10-CM

## 2016-05-19 NOTE — Telephone Encounter (Signed)
Refill needed on invokana called to [facility]  Also needs biopsy referral

## 2016-05-25 ENCOUNTER — Telehealth: Payer: Self-pay | Admitting: Endocrinology

## 2016-05-25 MED ORDER — INSULIN DEGLUDEC 100 UNIT/ML ~~LOC~~ SOPN: 68.0000 [IU] | PEN_INJECTOR | Freq: Every day | SUBCUTANEOUS | Status: DC

## 2016-05-25 NOTE — Telephone Encounter (Signed)
Patient stated that  Dr Lucianne MussKumar can send in the prescription that will replace the Lantus to [facility].

## 2016-05-25 NOTE — Telephone Encounter (Signed)
Rx submitted per pt's request.  

## 2016-05-30 ENCOUNTER — Other Ambulatory Visit (HOSPITAL_COMMUNITY)
Admission: RE | Admit: 2016-05-30 | Discharge: 2016-05-30 | Disposition: A | Discharge status: Home / Self Care | Payer: BLUE CROSS/BLUE SHIELD | Source: Ambulatory Visit | Attending: Radiology | Admitting: Radiology

## 2016-05-30 ENCOUNTER — Ambulatory Visit
Admission: RE | Admit: 2016-05-30 | Discharge: 2016-05-30 | Disposition: A | Discharge status: Home / Self Care | Payer: BLUE CROSS/BLUE SHIELD | Source: Ambulatory Visit | Attending: Endocrinology | Admitting: Endocrinology

## 2016-05-30 DIAGNOSIS — E042 Nontoxic multinodular goiter: Secondary | ICD-10-CM | POA: Insufficient documentation

## 2016-06-02 ENCOUNTER — Telehealth: Payer: Self-pay | Admitting: Endocrinology

## 2016-06-02 NOTE — Telephone Encounter (Signed)
PT called said she went ahead and had the insulin refilled to get her through the weekend until we can get the [place] approved through insurance.

## 2016-06-02 NOTE — Telephone Encounter (Signed)
See note below to be advise. PA for tresiba submitted today.

## 2016-06-05 NOTE — Telephone Encounter (Signed)
Dx codes given to [facility]

## 2016-06-08 ENCOUNTER — Other Ambulatory Visit: Payer: Self-pay | Admitting: Family Medicine

## 2016-06-08 ENCOUNTER — Other Ambulatory Visit (HOSPITAL_COMMUNITY)
Admission: RE | Admit: 2016-06-08 | Discharge: 2016-06-08 | Disposition: A | Discharge status: Home / Self Care | Payer: BLUE CROSS/BLUE SHIELD | Source: Ambulatory Visit | Attending: Family Medicine | Admitting: Family Medicine

## 2016-06-08 DIAGNOSIS — Z124 Encounter for screening for malignant neoplasm of cervix: Secondary | ICD-10-CM | POA: Insufficient documentation

## 2016-06-08 DIAGNOSIS — Z1151 Encounter for screening for human papillomavirus (HPV): Secondary | ICD-10-CM | POA: Diagnosis present

## 2016-06-12 LAB — CYTOLOGY - PAP

## 2016-07-25 ENCOUNTER — Other Ambulatory Visit: Payer: Self-pay | Admitting: Endocrinology

## 2016-08-09 ENCOUNTER — Other Ambulatory Visit: Payer: BLUE CROSS/BLUE SHIELD

## 2016-08-10 ENCOUNTER — Other Ambulatory Visit: Payer: BLUE CROSS/BLUE SHIELD

## 2016-08-11 ENCOUNTER — Ambulatory Visit: Payer: BLUE CROSS/BLUE SHIELD | Admitting: Endocrinology

## 2016-08-11 ENCOUNTER — Other Ambulatory Visit (INDEPENDENT_AMBULATORY_CARE_PROVIDER_SITE_OTHER): Payer: BLUE CROSS/BLUE SHIELD

## 2016-08-11 DIAGNOSIS — Z794 Long term (current) use of insulin: Secondary | ICD-10-CM | POA: Diagnosis not present

## 2016-08-11 DIAGNOSIS — E1165 Type 2 diabetes mellitus with hyperglycemia: Secondary | ICD-10-CM | POA: Diagnosis not present

## 2016-08-11 DIAGNOSIS — E042 Nontoxic multinodular goiter: Secondary | ICD-10-CM

## 2016-08-11 LAB — HEMOGLOBIN A1C: HEMOGLOBIN A1C: 9.8 % — AB (ref 4.6–6.5)

## 2016-08-11 LAB — BASIC METABOLIC PANEL
BUN: 13 mg/dL (ref 6–23)
CALCIUM: 9.1 mg/dL (ref 8.4–10.5)
CHLORIDE: 104 meq/L (ref 96–112)
CO2: 29 mEq/L (ref 19–32)
CREATININE: 0.56 mg/dL (ref 0.40–1.20)
GFR: 117.44 mL/min (ref >60.00)
Glucose, Bld: 153 mg/dL — ABNORMAL HIGH (ref 70–99)
Potassium: 4 mEq/L (ref 3.5–5.1)
Sodium: 139 mEq/L (ref 135–145)

## 2016-08-11 LAB — TSH: TSH: 5.49 u[IU]/mL — AB (ref 0.35–4.50)

## 2016-08-15 ENCOUNTER — Encounter: Payer: Self-pay | Admitting: Endocrinology

## 2016-08-15 ENCOUNTER — Ambulatory Visit (INDEPENDENT_AMBULATORY_CARE_PROVIDER_SITE_OTHER): Payer: BLUE CROSS/BLUE SHIELD | Admitting: Endocrinology

## 2016-08-15 VITALS — BP 132/80 | HR 79 | Ht 62.0 in | Wt 188.0 lb

## 2016-08-15 DIAGNOSIS — Z794 Long term (current) use of insulin: Secondary | ICD-10-CM | POA: Diagnosis not present

## 2016-08-15 DIAGNOSIS — E039 Hypothyroidism, unspecified: Secondary | ICD-10-CM

## 2016-08-15 DIAGNOSIS — E1165 Type 2 diabetes mellitus with hyperglycemia: Secondary | ICD-10-CM

## 2016-08-15 MED ORDER — LEVOTHYROXINE SODIUM 25 MCG PO TABS: 25.0000 ug | ORAL_TABLET | Freq: Every day | ORAL | 3 refills | Status: DC

## 2016-08-15 MED ORDER — VICTOZA 18 MG/3ML ~~LOC~~ SOPN: 1.8000 mg | PEN_INJECTOR | Freq: Every day | SUBCUTANEOUS | 3 refills | Status: DC

## 2016-08-15 NOTE — Progress Notes (Signed)
Patient ID: Natasha Chandler, female   DOB: [birth year], 60 y.o.   MRN: [id number]           Reason for Appointment: F/u for Type 2 Diabetes  Referring physician: Cam Hai  History of Present Illness:          Date of diagnosis of type 2 diabetes mellitus: 2004       Background history:   She was treated with oral hypoglycemic drugs like metformin and first few years Insulin was started around 2008-9 but details are not available. She thinks she has taken Lantus for several years, at some point was also taking Humalog. Trulicity was started 1-2 years ago, taking Invokana 3 yrs  Over the last 2-3 years she has been having poor control and has had difficulty focusing on her diabetes because of family issues and periodically not having insurance  Recent history:   INSULIN regimen is: Lantus 72 in am   Non-insulin hypoglycemic drugs the patient is taking are: Metformin ER 1g bid, Invokana 300 mg daily  She had been on a regimen of basal insulin, metformin and Invokana for some time without adequate control   A1c is unusually high at 9.8, previously 8.7 and has been going up progressively  Current blood sugar patterns and problems identified:   She thinks her blood sugars are higher because of stress; also because of her family obligations she is not able to pay attention to her diabetes management, diet and has worsening control  She has had some unusual readings over 300 also which again she thinks is from stress or not watching diet  She did increase her Lantus by 4 units and her fasting readings are only mildly high; has not checked very many readings  Evaristo Bury was not covered by insurance and she is still on Lantus She may be irregular with medications at times  Previously was on Trulicity but stopped this because of the cost, also benefited from Victoza   Side effects from medications have been:none  Compliance with the medical regimen: generally good Hypoglycemia:  None  Glucose monitoring:  done 1-2 times a day         Glucometer: One Touch.      Blood Glucose readings by time of day and averages from download  Mean values apply above for all meters except median for One Touch  PRE-MEAL Fasting Lunch Dinner Bedtime Overall  Glucose range: 142-229  126-480   169-318    Mean/median: 165    227  196     Self-care: The diet that the patient has been following is: tries to limit sweets.     Meal times are: Dinner: 7 pm   Typical meal intake: Breakfast is cereal or eggs             Dietician visit, most recent: 4 years ago               Exercise: walking 2-3 miles, 1-2/7    Weight history: 162-187  Wt Readings from Last 3 Encounters:  08/15/16 188 lb (85.3 kg)  05/08/16 183 lb (83 kg)  02/17/16 182 lb 9.6 oz (82.8 kg)    Glycemic control:   Lab Results  Component Value Date   HGBA1C 9.8 (H) 08/11/2016   HGBA1C 8.7 (H) 05/04/2016   HGBA1C 7.6 (H) 02/15/2016   Lab Results  Component Value Date   MICROALBUR 1.1 05/04/2016   LDLCALC 103 (H) 05/04/2016   CREATININE 0.56 08/11/2016   Lab  on 08/11/2016  Component Date Value Ref Range Status  . Hgb A1c MFr Bld 08/11/2016 9.8* 4.6 - 6.5 % Final  . TSH 08/11/2016 5.49* 0.35 - 4.50 uIU/mL Final  . Sodium 08/11/2016 139  135 - 145 mEq/L Final  . Potassium 08/11/2016 4.0  3.5 - 5.1 mEq/L Final  . Chloride 08/11/2016 104  96 - 112 mEq/L Final  . CO2 08/11/2016 29  19 - 32 mEq/L Final  . Glucose, Bld 08/11/2016 153* 70 - 99 mg/dL Final  . BUN 78/29/562109/07/2016 13  6 - 23 mg/dL Final  . Creatinine, Ser 08/11/2016 0.56  0.40 - 1.20 mg/dL Final  . Calcium 30/86/578409/07/2016 9.1  8.4 - 10.5 mg/dL Final  . GFR 69/62/952809/07/2016 117.44  >60.00 mL/min Final         Medication List       Accurate as of 08/15/16 11:59 PM. Always use your most recent med list.          amLODipine 10 MG tablet Commonly known as:  NORVASC Take 10 mg by mouth daily.   ibuprofen 600 MG tablet Commonly known as:   ADVIL,MOTRIN Take 1 tablet (600 mg total) by mouth every 6 (six) hours as needed.   INVOKANA 300 MG Tabs tablet Generic drug:  canagliflozin TAKE 1 TABLET (300 MG TOTAL) BY MOUTH DAILY BEFORE BREAKFAST.   LANTUS SOLOSTAR 100 UNIT/ML Solostar Pen Generic drug:  Insulin Glargine INJECT 68 UNITS INTO THE SKIN EVERY MORNING.   levothyroxine 25 MCG tablet Commonly known as:  SYNTHROID Take 1 tablet (25 mcg total) by mouth daily before breakfast.   losartan 100 MG tablet Commonly known as:  COZAAR Take 100 mg by mouth daily.   metFORMIN 500 MG 24 hr tablet Commonly known as:  GLUCOPHAGE-XR TAKE 2 TABLETS BY MOUTH TWICE A DAY   OSPHENA 60 MG Tabs Generic drug:  Ospemifene Take by mouth.   simvastatin 20 MG tablet Commonly known as:  ZOCOR Take 20 mg by mouth daily.   sodium chloride 0.9 % SOLN 100 mL with insulin regular 100 units/mL SOLN 1 Units/mL Inject into the vein continuous.   VICTOZA 18 MG/3ML Sopn Generic drug:  Liraglutide Inject 0.3 mLs (1.8 mg total) into the skin daily. Inject once daily at the same time       Allergies:  Allergies  Allergen Reactions  . Penicillins   . Sulfa Antibiotics     Past Medical History:  Diagnosis Date  . Asthma   . Diabetes mellitus   . Hyperlipidemia   . Hypertension   . Thyroid disease    Small goiter, stable  . Vitamin D deficiency     Past Surgical History:  Procedure Laterality Date  . TONSILLECTOMY      Family History  Problem Relation Age of Onset  . Diabetes Mother   . Diabetes Father   . Diabetes Maternal Aunt     Social History:  reports that she does not drink alcohol. Her tobacco and drug histories are not on file.    Review of Systems    Lipid history: Has low HDL and borderline LDL, getting simvastatin from PCP     Lab Results  Component Value Date   CHOL 158 05/04/2016   HDL 33.40 (L) 05/04/2016   LDLCALC 103 (H) 05/04/2016   TRIG 109.0 05/04/2016   CHOLHDL 5 05/04/2016            Hypertension: Treated with amlodipine and losartan, Also benefiting from Invokana   Most  recent eye exam was 7/16  Most recent foot exam: 12/16  Review of Systems   Thyroid: She has had a Nodular goiter since at least 2013 Thyroid biopsy was negative in 6/17 for a 1.9 cm isthmus  nodule  Has not had recent thyroid levels except in 9/17, has not been hypothyroid She does complain of feeling tired and somewhat cold  Lab Results  Component Value Date   TSH 5.49 (H) 08/11/2016      LABS:  Lab on 08/11/2016  Component Date Value Ref Range Status  . Hgb A1c MFr Bld 08/11/2016 9.8* 4.6 - 6.5 % Final  . TSH 08/11/2016 5.49* 0.35 - 4.50 uIU/mL Final  . Sodium 08/11/2016 139  135 - 145 mEq/L Final  . Potassium 08/11/2016 4.0  3.5 - 5.1 mEq/L Final  . Chloride 08/11/2016 104  96 - 112 mEq/L Final  . CO2 08/11/2016 29  19 - 32 mEq/L Final  . Glucose, Bld 08/11/2016 153* 70 - 99 mg/dL Final  . BUN 40/98/1191 13  6 - 23 mg/dL Final  . Creatinine, Ser 08/11/2016 0.56  0.40 - 1.20 mg/dL Final  . Calcium 47/82/9562 9.1  8.4 - 10.5 mg/dL Final  . GFR 13/07/6577 117.44  >60.00 mL/min Final    Physical Examination:  BP 132/80   Pulse 79   Ht 5\' 2"  (1.575 m)   Wt 188 lb (85.3 kg)   SpO2 97%   BMI 34.39 kg/m     ASSESSMENT:  Diabetes type 2, uncontrolled with BMI 33 See history of present illness for detailed discussion of current diabetes management, blood sugar patterns and problems identified  She is currently on basal insulin along with Invokana and metformin Her A1c is Getting progressively higher Fasting readings are relatively high also and only slightly better with increasing her Lantus Most of her poor control is related to difficulty with compliance with diet, no exercise and also reportedly stressed. She had benefited from Victoza and Trulicity in the past  GOITER: She does appear to be mildly hypothyroid now and this may also contribute to some increase of her  thyroid size  PLAN:    Restart Victoza, she can work up the dose to 1.8 mg fairly quickly.  Discussed possible side effects and safety issues  Start checking more readings after meals  Regular exercise with walking  Trial of levothyroxine 25 g daily  Will need short-term follow-up to see her program is working, discussed possibility of using mealtime insulin if her readings are higher at night  Patient Instructions  Check blood sugars on waking up 3x per week  Also check blood sugars about 2 hours after a meal and do this after different meals by rotation  Recommended blood sugar levels on waking up is 90-130 and about 2 hours after meal is 130-160  Please bring your blood sugar monitor to each visit, thank you  Victoza 0.6mg  and go to 1.2 in 3 days and then 1.8mg   Keep am sugars in above range with Lantus    Counseling time on subjects discussed above is over 50% of today's 25 minute visit   Sansa Alkema 08/16/2016, 11:46 AM   Note: This office note was prepared with Insurance underwriter. Any transcriptional errors that result from this process are unintentional.

## 2016-08-15 NOTE — Patient Instructions (Addendum)
Check blood sugars on waking up 3x per week  Also check blood sugars about 2 hours after a meal and do this after different meals by rotation  Recommended blood sugar levels on waking up is 90-130 and about 2 hours after meal is 130-160  Please bring your blood sugar monitor to each visit, thank you  Victoza 0.6mg  and go to 1.2 in 3 days and then 1.8mg   Keep am sugars in above range with Lantus

## 2016-09-15 ENCOUNTER — Other Ambulatory Visit: Payer: Self-pay | Admitting: Endocrinology

## 2016-09-15 NOTE — Telephone Encounter (Signed)
It has been ordered.

## 2016-09-15 NOTE — Telephone Encounter (Signed)
Pt needs refill of lantus solostart called to [facility] [phone]

## 2016-09-26 ENCOUNTER — Other Ambulatory Visit (INDEPENDENT_AMBULATORY_CARE_PROVIDER_SITE_OTHER): Payer: BLUE CROSS/BLUE SHIELD

## 2016-09-26 DIAGNOSIS — Z794 Long term (current) use of insulin: Secondary | ICD-10-CM

## 2016-09-26 DIAGNOSIS — E1165 Type 2 diabetes mellitus with hyperglycemia: Secondary | ICD-10-CM

## 2016-09-26 LAB — BASIC METABOLIC PANEL
BUN: 13 mg/dL (ref 6–23)
CALCIUM: 9.5 mg/dL (ref 8.4–10.5)
CO2: 29 mEq/L (ref 19–32)
Chloride: 103 mEq/L (ref 96–112)
Creatinine, Ser: 0.58 mg/dL (ref 0.40–1.20)
GFR: 112.73 mL/min (ref >60.00)
GLUCOSE: 113 mg/dL — AB (ref 70–99)
Potassium: 4.1 mEq/L (ref 3.5–5.1)
SODIUM: 138 meq/L (ref 135–145)

## 2016-09-26 LAB — T4, FREE: Free T4: 0.8 ng/dL (ref 0.60–1.60)

## 2016-09-26 LAB — TSH: TSH: 1.99 u[IU]/mL (ref 0.35–4.50)

## 2016-09-27 LAB — FRUCTOSAMINE: FRUCTOSAMINE: 266 umol/L (ref 0–285)

## 2016-09-28 ENCOUNTER — Telehealth: Payer: Self-pay | Admitting: Endocrinology

## 2016-09-28 ENCOUNTER — Encounter: Payer: Self-pay | Admitting: Endocrinology

## 2016-09-28 ENCOUNTER — Ambulatory Visit (INDEPENDENT_AMBULATORY_CARE_PROVIDER_SITE_OTHER): Payer: BLUE CROSS/BLUE SHIELD | Admitting: Endocrinology

## 2016-09-28 ENCOUNTER — Other Ambulatory Visit: Payer: Self-pay

## 2016-09-28 VITALS — BP 118/80 | HR 74 | Ht 62.0 in | Wt 179.0 lb

## 2016-09-28 DIAGNOSIS — E1165 Type 2 diabetes mellitus with hyperglycemia: Secondary | ICD-10-CM

## 2016-09-28 DIAGNOSIS — E559 Vitamin D deficiency, unspecified: Secondary | ICD-10-CM | POA: Diagnosis not present

## 2016-09-28 DIAGNOSIS — Z23 Encounter for immunization: Secondary | ICD-10-CM

## 2016-09-28 DIAGNOSIS — E038 Other specified hypothyroidism: Secondary | ICD-10-CM | POA: Diagnosis not present

## 2016-09-28 DIAGNOSIS — Z794 Long term (current) use of insulin: Secondary | ICD-10-CM

## 2016-09-28 DIAGNOSIS — E063 Autoimmune thyroiditis: Secondary | ICD-10-CM | POA: Insufficient documentation

## 2016-09-28 DIAGNOSIS — I10 Essential (primary) hypertension: Secondary | ICD-10-CM

## 2016-09-28 MED ORDER — VICTOZA 18 MG/3ML ~~LOC~~ SOPN: 1.8000 mg | PEN_INJECTOR | Freq: Every day | SUBCUTANEOUS | 3 refills | Status: DC

## 2016-09-28 NOTE — Progress Notes (Signed)
Patient ID: Natasha Chandler, female   DOB: 09-Oct-1956, 60 y.o.   MRN: [id number]           Reason for Appointment: F/u for Type 2 Diabetes  Referring physician: Cam Hai  History of Present Illness:          Date of diagnosis of type 2 diabetes mellitus: 2004       Background history:   She was treated with oral hypoglycemic drugs like metformin and first few years Insulin was started around 2008-9 but details are not available. She thinks she has taken Lantus for several years, at some point was also taking Humalog. Trulicity was started 1-2 years ago, taking Invokana 3 yrs  Over the last 2-3 years she has been having poor control and has had difficulty focusing on her diabetes because of family issues and periodically not having insurance  Recent history:   INSULIN regimen is: Lantus 64 in am    Non-insulin hypoglycemic drugs the patient is taking are: Metformin ER 1g bid, Invokana 300 mg daily, Victoza 1.8mg    A1c on the last visit was unusually high at 9.8, previously 8.7 and has been going up progressively She was restarted on Victoza  Current blood sugar patterns and problems identified:   She has been able to tolerate 1.8 mg Victoza without side effects.  She has also started walking much more than before, up to 3 miles, 4 days a week  Overall has been trying to cut back on portions better and may be benefiting with using Victoza also  Although she thinks her blood sugars are high in the morning they are significantly better than before and more consistent.  She has however not checked many readings after meals and probably none in a couple of weeks.  She does not remember these readings  However previously had readings over 300 occasionally nonfasting.  Did not bring her monitor for download today.  Although she is trying to watch her carbohydrates better she will sometimes have sweets and she thinks she has difficulty resisting food   Side effects from  medications have been:none  Compliance with the medical regimen: generally good Hypoglycemia: None  Glucose monitoring:  done 1-2 times a day         Glucometer: One Touch.      Blood Glucose readings by recall:  Mean values apply above for all meters except median for One Touch  PRE-MEAL Fasting Lunch Dinner Bedtime Overall  Glucose range: 101-117  ? 160-200    Mean/median:        Self-care: The diet that the patient has been following is: tries to limit fats.     Meal times are: Dinner: 7 pm   Typical meal intake: Breakfast is cereal or eggs, some candy             Dietician visit, most recent: 4 years ago               Exercise: walking 2-3 miles, 4/7    Weight history: 162-187  Wt Readings from Last 3 Encounters:  09/28/16 179 lb (81.2 kg)  08/15/16 188 lb (85.3 kg)  05/08/16 183 lb (83 kg)    Glycemic control:   Lab Results  Component Value Date   HGBA1C 9.8 (H) 08/11/2016   HGBA1C 8.7 (H) 05/04/2016   HGBA1C 7.6 (H) 02/15/2016   Lab Results  Component Value Date   MICROALBUR 1.1 05/04/2016   LDLCALC 103 (H) 05/04/2016   CREATININE 0.58  09/26/2016   Lab on 09/26/2016  Component Date Value Ref Range Status  . TSH 09/26/2016 1.99  0.35 - 4.50 uIU/mL Final  . Free T4 09/26/2016 0.80  0.60 - 1.60 ng/dL Final   Comment: Specimens from patients who are undergoing biotin therapy and /or ingesting biotin supplements may contain high levels of biotin.  The higher biotin concentration in these specimens interferes with this Free T4 assay.  Specimens that contain high levels  of biotin may cause false high results for this Free T4 assay.  Please interpret results in light of the total clinical presentation of the patient.    . Fructosamine 09/27/2016 266  0 - 285 umol/L Final   Comment: Published reference interval for apparently healthy subjects between age 2 and 3 is 65 - 285 umol/L and in a poorly controlled diabetic population is 228 - 563 umol/L with a mean of  396 umol/L.   Marland Kitchen Sodium 09/26/2016 138  135 - 145 mEq/L Final  . Potassium 09/26/2016 4.1  3.5 - 5.1 mEq/L Final  . Chloride 09/26/2016 103  96 - 112 mEq/L Final  . CO2 09/26/2016 29  19 - 32 mEq/L Final  . Glucose, Bld 09/26/2016 113* 70 - 99 mg/dL Final  . BUN 16/09/9603 13  6 - 23 mg/dL Final  . Creatinine, Ser 09/26/2016 0.58  0.40 - 1.20 mg/dL Final  . Calcium 54/08/8118 9.5  8.4 - 10.5 mg/dL Final  . GFR 14/78/2956 112.73  >60.00 mL/min Final         Medication List       Accurate as of 09/28/16  9:02 AM. Always use your most recent med list.          amLODipine 10 MG tablet Commonly known as:  NORVASC Take 10 mg by mouth daily.   ibuprofen 600 MG tablet Commonly known as:  ADVIL,MOTRIN Take 1 tablet (600 mg total) by mouth every 6 (six) hours as needed.   INVOKANA 300 MG Tabs tablet Generic drug:  canagliflozin TAKE 1 TABLET (300 MG TOTAL) BY MOUTH DAILY BEFORE BREAKFAST.   LANTUS SOLOSTAR 100 UNIT/ML Solostar Pen Generic drug:  Insulin Glargine INJECT 68 UNITS INTO THE SKIN EVERY MORNING.   levothyroxine 25 MCG tablet Commonly known as:  SYNTHROID Take 1 tablet (25 mcg total) by mouth daily before breakfast.   losartan 100 MG tablet Commonly known as:  COZAAR Take 100 mg by mouth daily.   metFORMIN 500 MG 24 hr tablet Commonly known as:  GLUCOPHAGE-XR TAKE 2 TABLETS BY MOUTH TWICE A DAY   OSPHENA 60 MG Tabs Generic drug:  Ospemifene Take by mouth.   simvastatin 20 MG tablet Commonly known as:  ZOCOR Take 20 mg by mouth daily.   VICTOZA 18 MG/3ML Sopn Generic drug:  liraglutide Inject 0.3 mLs (1.8 mg total) into the skin daily. Inject once daily at the same time       Allergies:  Allergies  Allergen Reactions  . Penicillins   . Sulfa Antibiotics     Past Medical History:  Diagnosis Date  . Asthma   . Diabetes mellitus   . Hyperlipidemia   . Hypertension   . Thyroid disease    Small goiter, stable  . Vitamin D deficiency      Past Surgical History:  Procedure Laterality Date  . TONSILLECTOMY      Family History  Problem Relation Age of Onset  . Diabetes Mother   . Diabetes Father   . Diabetes Maternal Aunt  Social History:  reports that she does not drink alcohol. Her tobacco and drug histories are not on file.    Review of Systems    Lipid history: Has low HDL and borderline LDL, on simvastatin 20 mg from PCP    Lab Results  Component Value Date   CHOL 158 05/04/2016   HDL 33.40 (L) 05/04/2016   LDLCALC 103 (H) 05/04/2016   TRIG 109.0 05/04/2016   CHOLHDL 5 05/04/2016           Hypertension: Treated with amlodipine and losartan100 mg She thinks she has a little fluid retention  Most recent eye exam was 7/16  Most recent foot exam: 12/16  Thyroid: She has had a Nodular goiter since at least 2013 Thyroid biopsy was negative in 6/17 for a 1.9 cm isthmus  Nodule  HYPOTHYROIDISM: On her last visit in September she was complaining about fatigue and some cold intolerance and her TSH was high at 5.5 She is since then empirically trying 25 g of levothyroxine and she thinks her energy level is better. Weight is improving also  She thinks she had an irritating cough before which is better  Lab Results  Component Value Date   TSH 1.99 09/26/2016   TSH 5.49 (H) 08/11/2016   FREET4 0.80 09/26/2016   She is asking about vitamin D and bone density.  She is vitamin D deficient, advised 5000 units vitamin D 3 from PCP   Review of Systems   LABS:  Lab on 09/26/2016  Component Date Value Ref Range Status  . TSH 09/26/2016 1.99  0.35 - 4.50 uIU/mL Final  . Free T4 09/26/2016 0.80  0.60 - 1.60 ng/dL Final   Comment: Specimens from patients who are undergoing biotin therapy and /or ingesting biotin supplements may contain high levels of biotin.  The higher biotin concentration in these specimens interferes with this Free T4 assay.  Specimens that contain high levels  of biotin may cause  false high results for this Free T4 assay.  Please interpret results in light of the total clinical presentation of the patient.    . Fructosamine 09/27/2016 266  0 - 285 umol/L Final   Comment: Published reference interval for apparently healthy subjects between age 60 and 2660 is 50205 - 285 umol/L and in a poorly controlled diabetic population is 228 - 563 umol/L with a mean of 396 umol/L.   Marland Kitchen. Sodium 09/26/2016 138  135 - 145 mEq/L Final  . Potassium 09/26/2016 4.1  3.5 - 5.1 mEq/L Final  . Chloride 09/26/2016 103  96 - 112 mEq/L Final  . CO2 09/26/2016 29  19 - 32 mEq/L Final  . Glucose, Bld 09/26/2016 113* 70 - 99 mg/dL Final  . BUN 16/10/960410/24/2017 13  6 - 23 mg/dL Final  . Creatinine, Ser 09/26/2016 0.58  0.40 - 1.20 mg/dL Final  . Calcium 54/09/811910/24/2017 9.5  8.4 - 10.5 mg/dL Final  . GFR 14/78/295610/24/2017 112.73  >60.00 mL/min Final    Physical Examination:  BP 118/80   Pulse 74   Ht 5\' 2"  (1.575 m)   Wt 179 lb (81.2 kg)   SpO2 98%   BMI 32.74 kg/m    Right thyroid lobe about 1-1/2 times normal, soft to firm and no discrete nodules felt. Has about a 1.5-2 cm midline nodule Left side minimally enlarged  Reflexes normal  ASSESSMENT:  Diabetes type 2, uncontrolled with BMI 33 See history of present illness for detailed discussion of current diabetes management, blood sugar  patterns and problems identified  She is currently on basal insulin, Victoza along with Invokana and metformin Her level of control is significantly better with adding Victoza  This is likely to be from helping her improve her diet and also general benefits from GLP therapy She has not checked many postprandial reading Fructosamine indicates overall fairly good control She is WALKING also Concern about cost of medications  OBESITY: She is benefiting from Victoza, exercise and encouraged her to continue improving diet.  HYPOTHYROIDISM she had mild hypothyroidism and symptomatically somewhat better with starting  low-dose levothyroxine supplement  GOITER: She has stable goiter and midline nodule  Vitamin D deficiency: She is requesting follow-up levels and will draw this on the next visit  PLAN:    Continue Victoza, new prescription to be sent  Discussed fasting blood sugar targets and for now she can continue the same range of Lantus doses, discussed that she should have a stable dose as long she watches her diet  Currently does not appear to be requiring mealtime insulin but needs to check more readings after meals  Discussed postprandial blood sugar targets  She will continue same dose of levothyroxine  Influenza vaccine given today  Patient Instructions  Check blood sugars on waking up  3x per week  Also check blood sugars about 2 hours after a meal and do this after different meals by rotation  Recommended blood sugar levels on waking up is 90-120 and about 2 hours after meal is 130-160  Please bring your blood sugar monitor to each visit, thank you  Counseling time on subjects discussed above is over 50% of today's 25 minute visit   Natasha Chandler 09/28/2016, 9:02 AM   Note: This office note was prepared with Insurance underwriter. Any transcriptional errors that result from this process are unintentional.

## 2016-09-28 NOTE — Telephone Encounter (Signed)
Patient need a refill of Victoza with more refills  Send to  [facility] [id number] - , [place] - [address] [phone] (Phone) [phone] (Fax)

## 2016-09-28 NOTE — Patient Instructions (Signed)
Check blood sugars on waking up  3x per week  Also check blood sugars about 2 hours after a meal and do this after different meals by rotation  Recommended blood sugar levels on waking up is 90-120 and about 2 hours after meal is 130-160  Please bring your blood sugar monitor to each visit, thank you

## 2016-10-10 ENCOUNTER — Telehealth: Payer: Self-pay | Admitting: Endocrinology

## 2016-10-10 NOTE — Telephone Encounter (Signed)
Pt called and said that [employer] did not receive her A1c from her last visit.  They only have one from June 1st.  She wants to know if this is something we automatically do or if they have to request it.  She requests call back sometime tomorrow.

## 2016-10-11 NOTE — Telephone Encounter (Signed)
I contacted the patient. She is requesting her recent A1C to be mailed to her address on our letter head stating her last a1c. Letter formulated and mailed per patient's request.

## 2016-10-23 ENCOUNTER — Other Ambulatory Visit: Payer: Self-pay | Admitting: Endocrinology

## 2016-10-23 NOTE — Telephone Encounter (Signed)
Pt called and said she needs [facility].  She asked if possible to have 2 months be sent in.

## 2016-10-24 NOTE — Telephone Encounter (Signed)
Refill sent per [facility] refill protocol/SLS  

## 2016-10-24 NOTE — Telephone Encounter (Signed)
[facility] soon [facility], Pt has call three times.

## 2016-11-20 ENCOUNTER — Other Ambulatory Visit (INDEPENDENT_AMBULATORY_CARE_PROVIDER_SITE_OTHER): Payer: BLUE CROSS/BLUE SHIELD

## 2016-11-20 DIAGNOSIS — E1165 Type 2 diabetes mellitus with hyperglycemia: Secondary | ICD-10-CM

## 2016-11-20 DIAGNOSIS — E559 Vitamin D deficiency, unspecified: Secondary | ICD-10-CM | POA: Diagnosis not present

## 2016-11-20 DIAGNOSIS — Z794 Long term (current) use of insulin: Secondary | ICD-10-CM

## 2016-11-20 LAB — BASIC METABOLIC PANEL
BUN: 14 mg/dL (ref 6–23)
CHLORIDE: 102 meq/L (ref 96–112)
CO2: 28 meq/L (ref 19–32)
CREATININE: 0.58 mg/dL (ref 0.40–1.20)
Calcium: 9.4 mg/dL (ref 8.4–10.5)
GFR: 112.67 mL/min (ref >60.00)
Glucose, Bld: 110 mg/dL — ABNORMAL HIGH (ref 70–99)
POTASSIUM: 4.2 meq/L (ref 3.5–5.1)
SODIUM: 138 meq/L (ref 135–145)

## 2016-11-20 LAB — VITAMIN D 25 HYDROXY (VIT D DEFICIENCY, FRACTURES): VITD: 24.63 ng/mL — AB (ref 30.00–100.00)

## 2016-11-20 LAB — HEMOGLOBIN A1C: HEMOGLOBIN A1C: 8 % — AB (ref 4.6–6.5)

## 2016-11-23 ENCOUNTER — Ambulatory Visit (INDEPENDENT_AMBULATORY_CARE_PROVIDER_SITE_OTHER): Payer: BLUE CROSS/BLUE SHIELD | Admitting: Endocrinology

## 2016-11-23 ENCOUNTER — Encounter: Payer: Self-pay | Admitting: Endocrinology

## 2016-11-23 VITALS — BP 132/88 | HR 89 | Ht 62.0 in | Wt 179.0 lb

## 2016-11-23 DIAGNOSIS — E063 Autoimmune thyroiditis: Secondary | ICD-10-CM

## 2016-11-23 DIAGNOSIS — E559 Vitamin D deficiency, unspecified: Secondary | ICD-10-CM

## 2016-11-23 DIAGNOSIS — E1165 Type 2 diabetes mellitus with hyperglycemia: Secondary | ICD-10-CM

## 2016-11-23 DIAGNOSIS — E038 Other specified hypothyroidism: Secondary | ICD-10-CM | POA: Diagnosis not present

## 2016-11-23 DIAGNOSIS — Z794 Long term (current) use of insulin: Secondary | ICD-10-CM | POA: Diagnosis not present

## 2016-11-23 NOTE — Patient Instructions (Addendum)
Check cost of Afrezza insulin and Libre  May need if sugar stays > 160 after meals  More sugars after meals

## 2016-11-23 NOTE — Progress Notes (Signed)
Patient ID: Natasha Chandler, female   DOB: March 17, 1956, 60 y.o.   MRN: [id number]           Reason for Appointment: F/u for Type 2 Diabetes  Referring physician: Cam Hai  History of Present Illness:          Date of diagnosis of type 2 diabetes mellitus: 2004       Background history:   She was treated with oral hypoglycemic drugs like metformin and first few years Insulin was started around 2008-9 but details are not available. She thinks she has taken Lantus for several years, at some point was also taking Humalog. Trulicity was started 1-2 years ago, taking Invokana 3 yrs  Over the last 2-3 years she has been having poor control and has had difficulty focusing on her diabetes because of family issues and periodically not having insurance  Recent history:   INSULIN regimen is: Lantus 64 in am    Non-insulin hypoglycemic drugs the patient is taking are: Metformin ER 1g bid, Invokana 300 mg daily, Victoza 1.8mg    A1c without Victoza was 9.8 and now it is down to 8%  Current blood sugar patterns and problems identified:  She has only 5 blood sugars on the last 30 day download  She says that her blood sugars maybe higher  At couple of days with readings over 200 unit from her coming down with a respiratory infection or from eating poorly and eating sweets  Difficult to know if she has postprandial hyperglycemia consistently but her A1c is relatively better than before   She has been able to tolerate 1.8 mg Victoza without side effects.  She has also not been able to walk as much because of the weather change and other issues  However has not gained back any weight.  Although she is trying to watch her portions better she will sometimes have sweets and she thinks she has difficulty resisting food   Side effects from medications have been:none  Compliance with the medical regimen: generally good Hypoglycemia: None  Glucose monitoring:  done 1-2 times a day          Glucometer: One Touch.      Blood Glucose readings by download:  Mean values apply above for all meters except median for One Touch  PRE-MEAL Fasting Lunch Afternoon  6:30 PM  Overall  Glucose range: 98   141-246  267    Mean/median:        Self-care: The diet that the patient has been following is: tries to limit fats.     Meal times are: Dinner: 7 pm   Typical meal intake: Breakfast is cereal or eggs, some candy             Dietician visit, most recent: 4 years ago               Exercise: walking less  Recently  Weight history: 162-187  Wt Readings from Last 3 Encounters:  11/23/16 179 lb (81.2 kg)  09/28/16 179 lb (81.2 kg)  08/15/16 188 lb (85.3 kg)    Glycemic control:   Lab Results  Component Value Date   HGBA1C 8.0 (H) 11/20/2016   HGBA1C 9.8 (H) 08/11/2016   HGBA1C 8.7 (H) 05/04/2016   Lab Results  Component Value Date   MICROALBUR 1.1 05/04/2016   LDLCALC 103 (H) 05/04/2016   CREATININE 0.58 11/20/2016   Lab on 11/20/2016  Component Date Value Ref Range Status  . Hgb A1c  MFr Bld 11/20/2016 8.0* 4.6 - 6.5 % Final  . Sodium 11/20/2016 138  135 - 145 mEq/L Final  . Potassium 11/20/2016 4.2  3.5 - 5.1 mEq/L Final  . Chloride 11/20/2016 102  96 - 112 mEq/L Final  . CO2 11/20/2016 28  19 - 32 mEq/L Final  . Glucose, Bld 11/20/2016 110* 70 - 99 mg/dL Final  . BUN 40/98/1191 14  6 - 23 mg/dL Final  . Creatinine, Ser 11/20/2016 0.58  0.40 - 1.20 mg/dL Final  . Calcium 47/82/9562 9.4  8.4 - 10.5 mg/dL Final  . GFR 13/07/6577 112.67  >60.00 mL/min Final  . VITD 11/20/2016 24.63* 30.00 - 100.00 ng/mL Final    Other active problems: See review of systems   Allergies as of 11/23/2016      Reactions   Penicillins    Sulfa Antibiotics       Medication List       Accurate as of 11/23/16 11:59 PM. Always use your most recent med list.          amLODipine 10 MG tablet Commonly known as:  NORVASC Take 10 mg by mouth daily.   ibuprofen 600 MG  tablet Commonly known as:  ADVIL,MOTRIN Take 1 tablet (600 mg total) by mouth every 6 (six) hours as needed.   INVOKANA 300 MG Tabs tablet Generic drug:  canagliflozin TAKE 1 TABLET (300 MG TOTAL) BY MOUTH DAILY BEFORE BREAKFAST.   LANTUS SOLOSTAR 100 UNIT/ML Solostar Pen Generic drug:  Insulin Glargine INJECT 68 UNITS INTO THE SKIN EVERY MORNING.   levothyroxine 25 MCG tablet Commonly known as:  SYNTHROID Take 1 tablet (25 mcg total) by mouth daily before breakfast.   losartan 100 MG tablet Commonly known as:  COZAAR Take 100 mg by mouth daily.   metFORMIN 500 MG 24 hr tablet Commonly known as:  GLUCOPHAGE-XR TAKE 2 TABLETS BY MOUTH TWICE A DAY   OSPHENA 60 MG Tabs Generic drug:  Ospemifene Take by mouth.   simvastatin 20 MG tablet Commonly known as:  ZOCOR Take 20 mg by mouth daily.   VICTOZA 18 MG/3ML Sopn Generic drug:  liraglutide Inject 0.3 mLs (1.8 mg total) into the skin daily. Inject once daily at the same time       Allergies:  Allergies  Allergen Reactions  . Penicillins   . Sulfa Antibiotics     Past Medical History:  Diagnosis Date  . Asthma   . Diabetes mellitus   . Hyperlipidemia   . Hypertension   . Thyroid disease    Small goiter, stable  . Vitamin D deficiency     Past Surgical History:  Procedure Laterality Date  . TONSILLECTOMY      Family History  Problem Relation Age of Onset  . Diabetes Mother   . Diabetes Father   . Diabetes Maternal Aunt     Social History:  reports that she does not drink alcohol. Her tobacco and drug histories are not on file.    Review of Systems    Lipid history: Has low HDL and borderline LDL, on simvastatin 20 mg from PCP    Lab Results  Component Value Date   CHOL 158 05/04/2016   HDL 33.40 (L) 05/04/2016   LDLCALC 103 (H) 05/04/2016   TRIG 109.0 05/04/2016   CHOLHDL 5 05/04/2016           Hypertension: Treated with amlodipine and losartan100 mg She thinks she has a little fluid  retention  Most recent eye  exam was 7/16  Most recent foot exam: 12/16  Thyroid: She has had a Nodular goiter since at least 2013 Thyroid biopsy was negative in 6/17 for a 1.9 cm isthmus  Nodule  HYPOTHYROIDISM: On her last visit in September she was complaining about fatigue and some cold intolerance and her TSH was high at 5.5 She is since then empirically trying 25 g of levothyroxine and she thinks her energy level is better. Weight is improving also  Lab Results  Component Value Date   TSH 1.99 09/26/2016   TSH 5.49 (H) 08/11/2016   FREET4 0.80 09/26/2016     She thinks she had an irritating cough before which is better  Lab Results  Component Value Date   TSH 1.99 09/26/2016   TSH 5.49 (H) 08/11/2016   FREET4 0.80 09/26/2016    She is vitamin D deficient, advised 5000 units vitamin D 3 from PCP   Review of Systems   LABS:  Lab on 11/20/2016  Component Date Value Ref Range Status  . Hgb A1c MFr Bld 11/20/2016 8.0* 4.6 - 6.5 % Final  . Sodium 11/20/2016 138  135 - 145 mEq/L Final  . Potassium 11/20/2016 4.2  3.5 - 5.1 mEq/L Final  . Chloride 11/20/2016 102  96 - 112 mEq/L Final  . CO2 11/20/2016 28  19 - 32 mEq/L Final  . Glucose, Bld 11/20/2016 110* 70 - 99 mg/dL Final  . BUN 16/10/960412/18/2017 14  6 - 23 mg/dL Final  . Creatinine, Ser 11/20/2016 0.58  0.40 - 1.20 mg/dL Final  . Calcium 54/09/811912/18/2017 9.4  8.4 - 10.5 mg/dL Final  . GFR 14/78/295612/18/2017 112.67  >60.00 mL/min Final  . VITD 11/20/2016 24.63* 30.00 - 100.00 ng/mL Final    Physical Examination:  BP 132/88   Pulse 89   Ht 5\' 2"  (1.575 m)   Wt 179 lb (81.2 kg)   SpO2 96%   BMI 32.74 kg/m    ASSESSMENT:  Diabetes type 2, uncontrolled with BMI 33 See history of present illness for detailed discussion of current diabetes management, blood sugar patterns and problems identified  She is currently on basal insulin, Victoza along with Invokana and metformin A1c is 8% and likely has postprandial  hyperglycemia Has very little glucose monitoring at home and recently has a couple of readings over 200 Fasting glucose in the lab was 110 and at home 98 However not clear if she has consistently high postprandial readings Compliance can be inconsistent with her diet  Vitamin D deficiency: She has a significantly low level and needs supplementation  PLAN:    Since she is not very keen on starting her mealtime dose like Humalog she will look into the insurance coverage with Afrezza and given her information on this  She will also look into the [employer] Libre sensor since she does not like to do much finger sticks for glucose monitoring, discussed how this will work  Will not change her regimen in the meantime since she is already on maximum dose of Victoza and Invokana  She will call back especially after getting more readings after meals to discuss next steps for mealtime insulin  Discussed postprandial blood sugar targets  Meanwhile she can try to improve her diet and be consistent with exercise  No change in Lantus needed   Patient Instructions  Check cost of Afrezza insulin and Libre  May need if sugar stays > 160 after meals  More sugars after meals  Counseling time on subjects discussed  above is over 50% of today's 25 minute visit   Matas Burrows 11/24/2016, 6:24 PM   Note: This office note was prepared with Dragon voice recognition system technology. Any transcriptional errors that result from this process are unintentional.

## 2017-01-03 ENCOUNTER — Ambulatory Visit
Admission: RE | Admit: 2017-01-03 | Discharge: 2017-01-03 | Disposition: A | Discharge status: Home / Self Care | Payer: BLUE CROSS/BLUE SHIELD | Source: Ambulatory Visit | Attending: Family Medicine | Admitting: Family Medicine

## 2017-01-03 ENCOUNTER — Other Ambulatory Visit: Payer: Self-pay | Admitting: Family Medicine

## 2017-01-03 DIAGNOSIS — R05 Cough: Secondary | ICD-10-CM

## 2017-01-03 DIAGNOSIS — R059 Cough, unspecified: Secondary | ICD-10-CM

## 2017-01-10 ENCOUNTER — Other Ambulatory Visit: Payer: Self-pay | Admitting: Endocrinology

## 2017-01-18 ENCOUNTER — Other Ambulatory Visit (INDEPENDENT_AMBULATORY_CARE_PROVIDER_SITE_OTHER): Payer: BLUE CROSS/BLUE SHIELD

## 2017-01-18 DIAGNOSIS — Z794 Long term (current) use of insulin: Secondary | ICD-10-CM | POA: Diagnosis not present

## 2017-01-18 DIAGNOSIS — E1165 Type 2 diabetes mellitus with hyperglycemia: Secondary | ICD-10-CM | POA: Diagnosis not present

## 2017-01-18 DIAGNOSIS — E063 Autoimmune thyroiditis: Secondary | ICD-10-CM

## 2017-01-18 DIAGNOSIS — E038 Other specified hypothyroidism: Secondary | ICD-10-CM

## 2017-01-18 LAB — GLUCOSE, RANDOM: Glucose, Bld: 99 mg/dL (ref 70–99)

## 2017-01-18 LAB — T4, FREE: Free T4: 0.83 ng/dL (ref 0.60–1.60)

## 2017-01-18 LAB — TSH: TSH: 3.5 u[IU]/mL (ref 0.35–4.50)

## 2017-01-19 ENCOUNTER — Other Ambulatory Visit: Payer: BLUE CROSS/BLUE SHIELD

## 2017-01-19 LAB — FRUCTOSAMINE: Fructosamine: 288 umol/L — ABNORMAL HIGH (ref 0–285)

## 2017-01-24 ENCOUNTER — Ambulatory Visit (INDEPENDENT_AMBULATORY_CARE_PROVIDER_SITE_OTHER): Payer: BLUE CROSS/BLUE SHIELD | Admitting: Endocrinology

## 2017-01-24 ENCOUNTER — Encounter: Payer: Self-pay | Admitting: Endocrinology

## 2017-01-24 VITALS — BP 138/74 | HR 73 | Ht 62.0 in | Wt 177.0 lb

## 2017-01-24 DIAGNOSIS — E063 Autoimmune thyroiditis: Secondary | ICD-10-CM

## 2017-01-24 DIAGNOSIS — E038 Other specified hypothyroidism: Secondary | ICD-10-CM

## 2017-01-24 DIAGNOSIS — Z794 Long term (current) use of insulin: Secondary | ICD-10-CM

## 2017-01-24 DIAGNOSIS — E1165 Type 2 diabetes mellitus with hyperglycemia: Secondary | ICD-10-CM | POA: Diagnosis not present

## 2017-01-24 NOTE — Progress Notes (Signed)
Patient ID: Natasha Chandler, female   DOB: 20-Dec-1955, 61 y.o.   MRN: [id number]           Reason for Appointment: F/u for Type 2 Diabetes  Referring physician: Cam Hai  History of Present Illness:          Date of diagnosis of type 2 diabetes mellitus: 2004       Background history:   She was treated with oral hypoglycemic drugs like metformin and first few years Insulin was started around 2008-9 but details are not available. She thinks she has taken Lantus for several years, at some point was also taking Humalog. Trulicity was started 1-2 years ago, taking Invokana 3 yrs  Over the last 2-3 years she has been having poor control and has had difficulty focusing on her diabetes because of family issues and periodically not having insurance  Recent history:   INSULIN regimen is: Lantus 68 in am    Non-insulin hypoglycemic drugs the patient is taking are: Metformin ER 1g bid, Invokana 300 mg daily, Victoza 1.8mg    A1c last was 8%, fructosamine is still high at 288  Current blood sugar patterns and problems identified:  She has only Morning blood sugars tested except for one reading after evening meal and twice before supper  She now says that she is having nausea for the last 3 months but also has had some viral illnesses  Previously had tolerated Victoza well  She is again having difficulty controlling carbohydrates especially in the evenings  Because of intercurrent illnesses she has not been able to exercise  Also she thinks that variability in her diet and taking cough syrups her sugars maybe higher  Not clear why her fasting readings are fluctuating  She does only randomly check evening blood sugar and last night it was high because of eating at a party    Side effects from medications have been:none  Compliance with the medical regimen: Mostly good Hypoglycemia: None  Glucose monitoring:  done 1-2 times a day         Glucometer: One Touch.      Blood  Glucose readings by download:  Mean values apply above for all meters except median for One Touch  PRE-MEAL Fasting Lunch Afternoon    Overall  Glucose range: 88-202  114, 143   238   Mean/median:     139   Self-care: The diet that the patient has been following is: tries to limit fats.     Meal times are: Dinner: 7 pm   Typical meal intake: Breakfast is cereal or eggs, some candy             Dietician visit, most recent: 4 years ago               Exercise: walking less  Recently  Weight history: 162-187  Wt Readings from Last 3 Encounters:  01/24/17 177 lb (80.3 kg)  11/23/16 179 lb (81.2 kg)  09/28/16 179 lb (81.2 kg)    Glycemic control:   Lab Results  Component Value Date   HGBA1C 8.0 (H) 11/20/2016   HGBA1C 9.8 (H) 08/11/2016   HGBA1C 8.7 (H) 05/04/2016   Lab Results  Component Value Date   MICROALBUR 1.1 05/04/2016   LDLCALC 103 (H) 05/04/2016   CREATININE 0.58 11/20/2016   Lab on 01/18/2017  Component Date Value Ref Range Status  . Fructosamine 01/18/2017 288* 0 - 285 umol/L Final   Comment: Published reference interval for apparently  healthy subjects between age 61 and 53 is 70 - 285 umol/L and in a poorly controlled diabetic population is 228 - 563 umol/L with a mean of 396 umol/L.   Marland Kitchen Glucose, Bld 01/18/2017 99  70 - 99 mg/dL Final  . TSH 16/09/9603 3.50  0.35 - 4.50 uIU/mL Final  . Free T4 01/18/2017 0.83  0.60 - 1.60 ng/dL Final   Comment: Specimens from patients who are undergoing biotin therapy and /or ingesting biotin supplements may contain high levels of biotin.  The higher biotin concentration in these specimens interferes with this Free T4 assay.  Specimens that contain high levels  of biotin may cause false high results for this Free T4 assay.  Please interpret results in light of the total clinical presentation of the patient.      Other active problems: See review of systems   Allergies as of 01/24/2017      Reactions   Penicillins     Sulfa Antibiotics       Medication List       Accurate as of 01/24/17  2:13 PM. Always use your most recent med list.          amLODipine 10 MG tablet Commonly known as:  NORVASC Take 10 mg by mouth daily.   ibuprofen 600 MG tablet Commonly known as:  ADVIL,MOTRIN Take 1 tablet (600 mg total) by mouth every 6 (six) hours as needed.   INVOKANA 300 MG Tabs tablet Generic drug:  canagliflozin TAKE 1 TABLET (300 MG TOTAL) BY MOUTH DAILY BEFORE BREAKFAST.   LANTUS SOLOSTAR 100 UNIT/ML Solostar Pen Generic drug:  Insulin Glargine INJECT 68 UNITS INTO THE SKIN EVERY MORNING.   levothyroxine 25 MCG tablet Commonly known as:  SYNTHROID Take 1 tablet (25 mcg total) by mouth daily before breakfast.   losartan 100 MG tablet Commonly known as:  COZAAR Take 100 mg by mouth daily.   metFORMIN 500 MG 24 hr tablet Commonly known as:  GLUCOPHAGE-XR TAKE 2 TABLETS BY MOUTH TWICE A DAY   OSPHENA 60 MG Tabs Generic drug:  Ospemifene Take by mouth.   simvastatin 20 MG tablet Commonly known as:  ZOCOR Take 20 mg by mouth daily.   VICTOZA 18 MG/3ML Sopn Generic drug:  liraglutide Inject 0.3 mLs (1.8 mg total) into the skin daily. Inject once daily at the same time       Allergies:  Allergies  Allergen Reactions  . Penicillins   . Sulfa Antibiotics     Past Medical History:  Diagnosis Date  . Asthma   . Diabetes mellitus   . Hyperlipidemia   . Hypertension   . Thyroid disease    Small goiter, stable  . Vitamin D deficiency     Past Surgical History:  Procedure Laterality Date  . TONSILLECTOMY      Family History  Problem Relation Age of Onset  . Diabetes Mother   . Diabetes Father   . Diabetes Maternal Aunt     Social History:  has an unknown smoking status. She has never used smokeless tobacco. She reports that she does not drink alcohol. Her drug history is not on file.    Review of Systems    Lipid history: Has low HDL and borderline LDL, on  simvastatin 20 mg from PCP    Lab Results  Component Value Date   CHOL 158 05/04/2016   HDL 33.40 (L) 05/04/2016   LDLCALC 103 (H) 05/04/2016   TRIG 109.0 05/04/2016   CHOLHDL  5 05/04/2016           Hypertension: Treated with amlodipine and losartan100 mg She thinks she has a little fluid retention  Most recent eye exam was 7/16  Most recent foot exam: 12/16  Thyroid: She has had a Nodular goiter since at least 2013 Thyroid biopsy was negative in 6/17 for a 1.9 cm isthmus  Nodule  HYPOTHYROIDISM: On her last visit in September she was complaining about fatigue and some cold intolerance and her TSH was high at 5.5 She is since then empirically trying 25 g of levothyroxine and this had improved energy level  TSH is again normal   Lab Results  Component Value Date   TSH 3.50 01/18/2017   TSH 1.99 09/26/2016   TSH 5.49 (H) 08/11/2016   FREET4 0.83 01/18/2017   FREET4 0.80 09/26/2016     She is vitamin D deficient, advised 5000 units vitamin D 3 from PCP   Review of Systems   LABS:  Lab on 01/18/2017  Component Date Value Ref Range Status  . Fructosamine 01/18/2017 288* 0 - 285 umol/L Final   Comment: Published reference interval for apparently healthy subjects between age 22 and 54 is 44 - 285 umol/L and in a poorly controlled diabetic population is 228 - 563 umol/L with a mean of 396 umol/L.   Marland Kitchen Glucose, Bld 01/18/2017 99  70 - 99 mg/dL Final  . TSH 40/98/1191 3.50  0.35 - 4.50 uIU/mL Final  . Free T4 01/18/2017 0.83  0.60 - 1.60 ng/dL Final   Comment: Specimens from patients who are undergoing biotin therapy and /or ingesting biotin supplements may contain high levels of biotin.  The higher biotin concentration in these specimens interferes with this Free T4 assay.  Specimens that contain high levels  of biotin may cause false high results for this Free T4 assay.  Please interpret results in light of the total clinical presentation of the patient.       Physical Examination:  BP 138/74   Pulse 73   Ht 5\' 2"  (1.575 m)   Wt 177 lb (80.3 kg)   SpO2 97%   BMI 32.37 kg/m    ASSESSMENT:  Diabetes type 2, uncontrolled with BMI 33 See history of present illness for detailed discussion of current diabetes management, blood sugar patterns and problems identified  She is currently on basal insulin, Victoza along with Invokana and metformin A1c is 8% Again This is partly related to her difficulty with consistently watching diet and exercising is also probable continued postprandial hyperglycemia and not controlled with Victoza.  Not clear if she is having nausea related to underlying problems or now not tolerating Victoza Fasting readings are mostly normal although were higher when she had intercurrent illness However not clear if she has consistently high postprandial readings as she has only one reading which was high yesterday from eating cake Compliance can be better with controlling couple hydrogen portions, she is asking for a weight-loss drug  Mild hypothyroidism: TSH consistently normal now  PLAN:    She may do better with Ozempic compared to Victoza  However she does need to start checking blood sugars after meals and hopefully he will be better compliant with portions and carbohydrates  She can start exercising again  Discussed how to dose Ozempic starting with 0.25 mg weekly for 2 times and then 0.5 if not nauseated.  If she has nausea with 0.5 she can go back to 0.25 again.  However will like to  get her on the 1 mg dose eventually  Showed her how to use the Ozempic pen  She'll continue oral medications also including Invokana eyes renal function are stable  No change in Lantus unless fasting blood sugars start getting lower or higher   Patient Instructions  Start Ozempic 0.25mg  with the pen as shown once weekly on the same day of the week.  You may inject in the stomach, thigh or arm as indicated in the brochure  given.  You will feel fullness of the stomach with starting the medication and should try to keep the portions at meals small.   You may experience nausea in the first few days which usually gets better over time    After 2 weeks start 0.5mg  weekly  Check after meal  Counseling time on subjects discussed above is over 50% of today's 25 minute visit   Natasha Chandler 01/24/2017, 2:13 PM   Note: This office note was prepared with Dragon voice recognition system technology. Any transcriptional errors that result from this process are unintentional.

## 2017-01-24 NOTE — Patient Instructions (Addendum)
Start Ozempic 0.25mg  with the pen as shown once weekly on the same day of the week.  You may inject in the stomach, thigh or arm as indicated in the brochure given.  You will feel fullness of the stomach with starting the medication and should try to keep the portions at meals small.   You may experience nausea in the first few days which usually gets better over time    After 2 weeks start 0.5mg  weekly  Check after meal

## 2017-01-25 ENCOUNTER — Other Ambulatory Visit: Payer: Self-pay

## 2017-01-25 ENCOUNTER — Emergency Department (HOSPITAL_COMMUNITY)
Admission: EM | Admit: 2017-01-25 | Discharge: 2017-01-25 | Disposition: A | Discharge status: Home / Self Care | Payer: BLUE CROSS/BLUE SHIELD | Attending: Emergency Medicine | Admitting: Emergency Medicine

## 2017-01-25 ENCOUNTER — Encounter (HOSPITAL_COMMUNITY): Payer: Self-pay | Admitting: Emergency Medicine

## 2017-01-25 DIAGNOSIS — I10 Essential (primary) hypertension: Secondary | ICD-10-CM | POA: Insufficient documentation

## 2017-01-25 DIAGNOSIS — R040 Epistaxis: Secondary | ICD-10-CM | POA: Diagnosis present

## 2017-01-25 DIAGNOSIS — J45909 Unspecified asthma, uncomplicated: Secondary | ICD-10-CM | POA: Diagnosis not present

## 2017-01-25 DIAGNOSIS — E119 Type 2 diabetes mellitus without complications: Secondary | ICD-10-CM | POA: Diagnosis not present

## 2017-01-25 DIAGNOSIS — Z794 Long term (current) use of insulin: Secondary | ICD-10-CM | POA: Diagnosis not present

## 2017-01-25 LAB — CBG MONITORING, ED: Glucose-Capillary: 138 mg/dL — ABNORMAL HIGH (ref 65–99)

## 2017-01-25 MED ORDER — INSULIN PEN NEEDLE 31G X 5 MM MISC: 5 refills | Status: DC

## 2017-01-25 MED ORDER — FREESTYLE LIBRE READER DEVI: 1.0000 | Freq: Once | 1 refills | Status: DC

## 2017-01-25 MED ORDER — OXYMETAZOLINE HCL 0.05 % NA SOLN
1.0000 | Freq: Once | NASAL | Status: AC
  Administered 2017-01-25: 1 via NASAL
  Filled 2017-01-25: qty 15

## 2017-01-25 MED ORDER — AZITHROMYCIN 250 MG PO TABS: 250.0000 mg | ORAL_TABLET | Freq: Every day | ORAL | 0 refills | Status: DC

## 2017-01-25 MED ORDER — FREESTYLE LIBRE READER DEVI: 1.0000 | Freq: Once | 1 refills | Status: AC

## 2017-01-25 MED ORDER — FREESTYLE LIBRE SENSOR SYSTEM MISC: 1.0000 | 3 refills | Status: DC

## 2017-01-25 MED ORDER — ONDANSETRON HCL 4 MG/2ML IJ SOLN
4.0000 mg | Freq: Once | INTRAMUSCULAR | Status: AC
  Administered 2017-01-25: 4 mg via INTRAVENOUS
  Filled 2017-01-25: qty 2

## 2017-01-25 MED ORDER — SODIUM CHLORIDE 0.9 % IV BOLUS (SEPSIS)
1000.0000 mL | Freq: Once | INTRAVENOUS | Status: AC
  Administered 2017-01-25: 1000 mL via INTRAVENOUS

## 2017-01-25 NOTE — ED Notes (Signed)
Dr Juleen ChinaKohut at bedside for packing.

## 2017-01-25 NOTE — ED Triage Notes (Signed)
Pt hit nose on bed this morning, arrived with nose bleed. Pt states both sides. Pt c/o nausea from nose bleed and spitting up large clots of blood. Pt given 4mg  Zofran IV by EMS.

## 2017-01-25 NOTE — ED Provider Notes (Addendum)
[facility]-EMERGENCY DEPT Provider Note   CSN: [id number] Arrival date & time: 01/25/17  0756     History   Chief Complaint Chief Complaint  Patient presents with  . Epistaxis    HPI Natasha Chandler is a 61 y.o. female.  HPI   [age]-year-old female with epistaxis. Patient struck her nose in them at this morning. She has had bleeding since then. Spitting up large amounts of blood and feels nauseated. No vomiting. No blood thinners. No dizziness or lightheadedness. No dyspnea. She is trialing pressure without any appreciable improvement.  Past Medical History:  Diagnosis Date  . Asthma   . Diabetes mellitus   . Hyperlipidemia   . Hypertension   . Thyroid disease    Small goiter, stable  . Vitamin D deficiency     Patient Active Problem List   Diagnosis Date Noted  . Uncontrolled type 2 diabetes mellitus with hyperglycemia, with long-term current use of insulin (HCC) 09/28/2016  . 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 Pure hypercholesterolemia 02/18/2016  . Plantar fasciitis 07/05/2011  . Asthma 07/05/2011  . Multinodular goiter 07/05/2011  . Hypertension 07/05/2011    Past Surgical History:  Procedure Laterality Date  . TONSILLECTOMY      OB History    No data available       Home Medications    Prior to Admission medications   Medication Sig Start Date End Date Taking? Authorizing Provider  albuterol (PROVENTIL HFA;VENTOLIN HFA) 108 (90 Base) MCG/ACT inhaler Inhale 1 puff into the lungs every 6 (six) hours as needed for shortness of breath.  11/26/16  Yes Historical Provider, MD  amLODipine (NORVASC) 10 MG tablet Take 10 mg by mouth daily.   Yes Historical Provider, MD  ibuprofen (ADVIL,MOTRIN) 600 MG tablet Take 1 tablet (600 mg total) by mouth every 6 (six) hours as needed. Patient taking differently: Take 600 mg by mouth every 6 (six) hours as needed for moderate pain.  11/27/15  Yes Shon Baton, MD  INVOKANA 300 MG TABS tablet TAKE 1 TABLET  (300 MG TOTAL) BY MOUTH DAILY BEFORE BREAKFAST. 10/24/16  Yes Reather Littler, MD  LANTUS SOLOSTAR 100 UNIT/ML Solostar Pen INJECT 68 UNITS INTO THE SKIN EVERY MORNING. 09/15/16  Yes Reather Littler, MD  levothyroxine (SYNTHROID) 25 MCG tablet Take 1 tablet (25 mcg total) by mouth daily before breakfast. 08/15/16  Yes Reather Littler, MD  losartan (COZAAR) 100 MG tablet Take 100 mg by mouth daily.   Yes Historical Provider, MD  metFORMIN (GLUCOPHAGE-XR) 500 MG 24 hr tablet TAKE 2 TABLETS BY MOUTH TWICE A DAY 01/10/17  Yes Reather Littler, MD  Ospemifene (OSPHENA) 60 MG TABS Take 60 mg by mouth daily.    Yes Historical Provider, MD  simvastatin (ZOCOR) 20 MG tablet Take 20 mg by mouth daily.   Yes Historical Provider, MD  VICTOZA 18 MG/3ML SOPN Inject 0.3 mLs (1.8 mg total) into the skin daily. Inject once daily at the same time 09/28/16  Yes Reather Littler, MD  azithromycin (ZITHROMAX) 250 MG tablet Take 1 tablet (250 mg total) by mouth daily. Take first 2 tablets together, then 1 every day until finished. 01/25/17   Raeford Razor, MD  Continuous Blood Gluc Receiver (FREESTYLE LIBRE READER) DEVI 1 Device by Does not apply route once. 01/25/17 01/25/17  Reather Littler, MD  Continuous Blood Gluc Sensor (FREESTYLE LIBRE SENSOR SYSTEM) MISC 1 strip by Does not apply route every 14 (fourteen) days. 01/25/17   Reather Littler, MD  Insulin Pen  Needle 31G X 5 MM MISC Use to inject insulin once daily 01/25/17   Reather LittlerAjay Kumar, MD    Family History Family History  Problem Relation Age of Onset  . Diabetes Mother   . Diabetes Father   . Diabetes Maternal Aunt     Social History Social History  Substance Use Topics  . Smoking status: Unknown If Ever Smoked  . Smokeless tobacco: Never Used  . Alcohol use No     Allergies   Penicillins and Sulfa antibiotics   Review of Systems Review of Systems  All systems reviewed and negative, other than as noted in HPI.   Physical Exam Updated Vital Signs BP 173/75 (BP Location: Left Arm)    Pulse 82   Temp 98.6 F (37 C) (Axillary)   Resp 20   SpO2 98%   Physical Exam  Constitutional: She appears well-developed and well-nourished. No distress.  HENT:  Head: Normocephalic.  Procedure epistaxis from the right naris. Blood noted in posterior pharynx. Patient sitting forward and spitting out blood.  Eyes: Conjunctivae are normal. Right eye exhibits no discharge. Left eye exhibits no discharge.  Neck: Neck supple.  Cardiovascular: Normal rate, regular rhythm and normal heart sounds.  Exam reveals no gallop and no friction rub.   No murmur heard. Pulmonary/Chest: Effort normal and breath sounds normal. No respiratory distress.  Abdominal: Soft. She exhibits no distension. There is no tenderness.  Musculoskeletal: She exhibits no edema or tenderness.  Neurological: She is alert.  Skin: Skin is warm and dry.  Psychiatric: She has a normal mood and affect. Her behavior is normal. Thought content normal.  Nursing note and vitals reviewed.    ED Treatments / Results  Labs (all labs ordered are listed, but only abnormal results are displayed) Labs Reviewed  CBG MONITORING, ED - Abnormal; Notable for the following:       Result Value   Glucose-Capillary 138 (*)    All other components within normal limits    EKG  EKG Interpretation None       Radiology No results found.  Procedures .Epistaxis Management Date/Time: 01/25/2017 8:15 AM Performed by: Raeford RazorKOHUT, Shauntae Reitman Authorized by: Raeford RazorKOHUT, [place] Brogden   Consent:    Consent obtained:  Verbal   Consent given by:  Patient   Risks discussed:  Bleeding, nasal injury and pain   Alternatives discussed:  Alternative treatment and observation Procedure details:    Treatment site:  R posterior   Treatment method:  Merocel sponge   Treatment complexity:  Extensive   Treatment episode: initial   Post-procedure details:    Assessment:  Bleeding decreased   Patient tolerance of procedure:  Tolerated well, no immediate  complications   (including critical care time)  Medications Ordered in ED Medications  oxymetazoline (AFRIN) 0.05 % nasal spray 1 spray (1 spray Each Nare Given 01/25/17 0817)  sodium chloride 0.9 % bolus 1,000 mL (0 mLs Intravenous Stopped 01/25/17 1103)  ondansetron (ZOFRAN) injection 4 mg (4 mg Intravenous Given 01/25/17 0836)     Initial Impression / Assessment and Plan / ED Course  I have reviewed the triage vital signs and the nursing notes.  Pertinent labs & imaging results that were available during my care of the patient were reviewed by me and considered in my medical decision making (see chart for details).     [age]-year-old female with epistaxis. Brisk bleeding from the right naris. She is not anticoagulated. She required packing. Adequate hemostasis. She was observed for over an hour  or hour afterwards. No significant continued bleeding. She'll be placed on prophylactic antibiotics. Return precautions discussed. Close ENT follow-up.  Final Clinical Impressions(s) / ED Diagnoses   Final diagnoses:  Right-sided epistaxis    New Prescriptions New Prescriptions   AZITHROMYCIN (ZITHROMAX) 250 MG TABLET    Take 1 tablet (250 mg total) by mouth daily. Take first 2 tablets together, then 1 every day until finished.   CONTINUOUS BLOOD GLUC SENSOR (FREESTYLE LIBRE SENSOR SYSTEM) MISC    1 strip by Does not apply route every 14 (fourteen) days.   INSULIN PEN NEEDLE 31G X 5 MM MISC    Use to inject insulin once daily     Raeford Razor, MD 02/11/17 2349    Raeford Razor, MD 02/20/17 1232

## 2017-01-25 NOTE — ED Notes (Signed)
Bed: [facility]18 Expected date:  Expected time:  Means of arrival:  Comments: EMS nosebleed  

## 2017-01-26 ENCOUNTER — Other Ambulatory Visit: Payer: Self-pay

## 2017-01-26 ENCOUNTER — Telehealth: Payer: Self-pay | Admitting: Endocrinology

## 2017-01-26 MED ORDER — FREESTYLE LIBRE READER DEVI: 1.0000 | Freq: Once | 1 refills | Status: AC

## 2017-01-26 NOTE — Telephone Encounter (Signed)
Sensors were ordered 01/25/17 and reader was ordered 01/26/17

## 2017-01-26 NOTE — Telephone Encounter (Signed)
[facility] and receiver for the pt asap, thank you

## 2017-01-30 ENCOUNTER — Other Ambulatory Visit: Payer: Self-pay | Admitting: Endocrinology

## 2017-01-30 DIAGNOSIS — J300 Vasomotor rhinitis: Secondary | ICD-10-CM | POA: Insufficient documentation

## 2017-01-30 DIAGNOSIS — R040 Epistaxis: Secondary | ICD-10-CM | POA: Insufficient documentation

## 2017-01-30 DIAGNOSIS — J3089 Other allergic rhinitis: Secondary | ICD-10-CM | POA: Insufficient documentation

## 2017-02-02 ENCOUNTER — Telehealth: Payer: Self-pay | Admitting: Endocrinology

## 2017-02-02 ENCOUNTER — Other Ambulatory Visit: Payer: Self-pay

## 2017-02-02 MED ORDER — SEMAGLUTIDE(0.25 OR 0.5MG/DOS) 2 MG/1.5ML ~~LOC~~ SOPN: 0.5000 mg | PEN_INJECTOR | SUBCUTANEOUS | 3 refills | Status: DC

## 2017-02-02 NOTE — Telephone Encounter (Signed)
Pt called back and really needs this script sent in, her father is sick and she is trying to get this done before she goes back to be with him.

## 2017-02-02 NOTE — Telephone Encounter (Signed)
Pt called in and said that she is going to be running out of the Ozempic and would like to know if [facility].  If not she will have to go back to [facility].  Please advise.

## 2017-02-02 NOTE — Telephone Encounter (Signed)
Ordered and patient has ben notified

## 2017-02-13 ENCOUNTER — Telehealth: Payer: Self-pay | Admitting: Endocrinology

## 2017-02-13 NOTE — Telephone Encounter (Signed)
Detailed message left on voicemail.

## 2017-02-13 NOTE — Telephone Encounter (Signed)
Insurance will not cover Semaglutide (OZEMPIC) 0.25 or 0.5 MG/DOSE SOPN  Alternative medication need to be in the same tier.  Please advise

## 2017-02-13 NOTE — Telephone Encounter (Signed)
Please tell [facility] by [facility] co-pay card that was given to her and she needs to have the pharmacist use card and not insurance

## 2017-02-19 ENCOUNTER — Other Ambulatory Visit: Payer: Self-pay | Admitting: Endocrinology

## 2017-02-20 ENCOUNTER — Telehealth: Payer: Self-pay | Admitting: Endocrinology

## 2017-02-20 NOTE — Telephone Encounter (Signed)
Need prescriptionFree style libre, test strips   [facility] [phone] - [place], [place] - [address] [phone] (Phone) [phone] (Fax)

## 2017-02-21 ENCOUNTER — Other Ambulatory Visit: Payer: Self-pay

## 2017-02-21 ENCOUNTER — Other Ambulatory Visit: Payer: Self-pay | Admitting: Endocrinology

## 2017-02-21 ENCOUNTER — Ambulatory Visit: Payer: BLUE CROSS/BLUE SHIELD | Admitting: Endocrinology

## 2017-02-21 ENCOUNTER — Other Ambulatory Visit (INDEPENDENT_AMBULATORY_CARE_PROVIDER_SITE_OTHER): Payer: BLUE CROSS/BLUE SHIELD

## 2017-02-21 DIAGNOSIS — Z794 Long term (current) use of insulin: Secondary | ICD-10-CM

## 2017-02-21 DIAGNOSIS — E1165 Type 2 diabetes mellitus with hyperglycemia: Secondary | ICD-10-CM | POA: Diagnosis not present

## 2017-02-21 LAB — COMPREHENSIVE METABOLIC PANEL
ALT: 18 U/L (ref 0–35)
AST: 17 U/L (ref 0–37)
Albumin: 4.1 g/dL (ref 3.5–5.2)
Alkaline Phosphatase: 70 U/L (ref 39–117)
BUN: 17 mg/dL (ref 6–23)
CHLORIDE: 105 meq/L (ref 96–112)
CO2: 27 meq/L (ref 19–32)
CREATININE: 0.71 mg/dL (ref 0.40–1.20)
Calcium: 9.6 mg/dL (ref 8.4–10.5)
GFR: 89.14 mL/min (ref >60.00)
GLUCOSE: 129 mg/dL — AB (ref 70–99)
Potassium: 4.3 mEq/L (ref 3.5–5.1)
SODIUM: 138 meq/L (ref 135–145)
Total Bilirubin: 0.5 mg/dL (ref 0.2–1.2)
Total Protein: 6.7 g/dL (ref 6.0–8.3)

## 2017-02-21 LAB — HEMOGLOBIN A1C: Hgb A1c MFr Bld: 7.4 % — ABNORMAL HIGH (ref 4.6–6.5)

## 2017-02-21 LAB — LIPID PANEL
CHOL/HDL RATIO: 4
Cholesterol: 171 mg/dL (ref 0–200)
HDL: 38 mg/dL — ABNORMAL LOW (ref >39.00)
LDL CALC: 109 mg/dL — AB (ref 0–99)
NonHDL: 132.98
Triglycerides: 121 mg/dL (ref 0.0–149.0)
VLDL: 24.2 mg/dL (ref 0.0–40.0)

## 2017-02-21 MED ORDER — GLUCOSE BLOOD VI STRP: 1.0000 | ORAL_STRIP | 1 refills | Status: DC | PRN

## 2017-02-23 ENCOUNTER — Ambulatory Visit (INDEPENDENT_AMBULATORY_CARE_PROVIDER_SITE_OTHER): Payer: BLUE CROSS/BLUE SHIELD | Admitting: Endocrinology

## 2017-02-23 ENCOUNTER — Ambulatory Visit: Payer: BLUE CROSS/BLUE SHIELD | Admitting: Endocrinology

## 2017-02-23 ENCOUNTER — Encounter: Payer: Self-pay | Admitting: Endocrinology

## 2017-02-23 VITALS — BP 126/78 | HR 83 | Wt 175.0 lb

## 2017-02-23 DIAGNOSIS — Z794 Long term (current) use of insulin: Secondary | ICD-10-CM

## 2017-02-23 DIAGNOSIS — E782 Mixed hyperlipidemia: Secondary | ICD-10-CM

## 2017-02-23 DIAGNOSIS — E1165 Type 2 diabetes mellitus with hyperglycemia: Secondary | ICD-10-CM

## 2017-02-23 DIAGNOSIS — I10 Essential (primary) hypertension: Secondary | ICD-10-CM

## 2017-02-23 NOTE — Patient Instructions (Signed)
Take 0.5 Ozempic weekly

## 2017-02-23 NOTE — Progress Notes (Signed)
Patient ID: Natasha Chandler, female   DOB: 08-Jun-1956, 61 y.o.   MRN: [id number]           Reason for Appointment: F/u for Type 2 Diabetes  Referring physician: Cam Hai  History of Present Illness:          Date of diagnosis of type 2 diabetes mellitus: 2004       Background history:   She was treated with oral hypoglycemic drugs like metformin and first few years Insulin was started around 2008-9 but details are not available. She thinks she has taken Lantus for several years, at some point was also taking Humalog. Trulicity was started 1-2 years ago, taking Invokana 3 yrs  Over the last 2-3 years she has been having poor control and has had difficulty focusing on her diabetes because of family issues and periodically not having insurance  Recent history:   INSULIN regimen is: Lantus 68 in am    Non-insulin hypoglycemic drugs the patient is taking are: Metformin ER 1g bid, Invokana 300 mg daily, Victoza 1.8mg    A1c is now relatively better at 7.4, previously as high as 9.8  Current blood sugar patterns and problems identified:  She has only in the last 3-4 days started using the freestyle Libre sensor in addition to her One Touch meter  Again usually on her monitor checking readings fasting and not after meals as much  She has started using the Ozempic about a month ago and did take at least 2 injections of the 0.5 dosage, she is tolerating this well and was able to get this with her co-pay card although with some difficulty  She has not had much change in her blood sugars on her monitor as yet although does not have any consistently high post pen  If she did have a significantly high reading of over 300 yesterday afternoon after eating 2 hot dogs  However she took 10 units Lantus extra yesterday and got hypoglycemic last night but blood sugar down to 69 and she treated it with a lot of food  Her overnight blood sugars are otherwise on her continuous monitor around  140  Fasting blood sugars on her fingersticks are variable ranging from 77-165  Postprandial readings otherwise have been occasionally around 180-200   Side effects from medications have been:none  Compliance with the medical regimen: Mostly good Hypoglycemia: Only once as above  Glucose monitoring:  done 1-2 times a day         Glucometer: One Touch and freestyle Libre       Blood Glucose readings by download as above:  Median blood sugar on her fingersticks overall 112  Self-care: The diet that the patient has been following is: tries to limit fats.     Meal times are: Dinner: 7 pm   Typical meal intake: Breakfast is cereal or eggs, sometimes higher fat meals           Dietician visit, most recent: 4 years ago               Exercise: walking more recently  Weight history: 162-187  Wt Readings from Last 3 Encounters:  02/23/17 175 lb (79.4 kg)  01/24/17 177 lb (80.3 kg)  11/23/16 179 lb (81.2 kg)    Glycemic control:   Lab Results  Component Value Date   HGBA1C 7.4 (H) 02/21/2017   HGBA1C 8.0 (H) 11/20/2016   HGBA1C 9.8 (H) 08/11/2016   Lab Results  Component Value Date  MICROALBUR 1.1 05/04/2016   LDLCALC 109 (H) 02/21/2017   CREATININE 0.71 02/21/2017   Lab on 02/21/2017  Component Date Value Ref Range Status  . Hgb A1c MFr Bld 02/21/2017 7.4* 4.6 - 6.5 % Final  . Sodium 02/21/2017 138  135 - 145 mEq/L Final  . Potassium 02/21/2017 4.3  3.5 - 5.1 mEq/L Final  . Chloride 02/21/2017 105  96 - 112 mEq/L Final  . CO2 02/21/2017 27  19 - 32 mEq/L Final  . Glucose, Bld 02/21/2017 129* 70 - 99 mg/dL Final  . BUN 16/09/9603 17  6 - 23 mg/dL Final  . Creatinine, Ser 02/21/2017 0.71  0.40 - 1.20 mg/dL Final  . Total Bilirubin 02/21/2017 0.5  0.2 - 1.2 mg/dL Final  . Alkaline Phosphatase 02/21/2017 70  39 - 117 U/L Final  . AST 02/21/2017 17  0 - 37 U/L Final  . ALT 02/21/2017 18  0 - 35 U/L Final  . Total Protein 02/21/2017 6.7  6.0 - 8.3 g/dL Final  . Albumin  54/08/8118 4.1  3.5 - 5.2 g/dL Final  . Calcium 14/78/2956 9.6  8.4 - 10.5 mg/dL Final  . GFR 21/30/8657 89.14  >60.00 mL/min Final  . Cholesterol 02/21/2017 171  0 - 200 mg/dL Final  . Triglycerides 02/21/2017 121.0  0.0 - 149.0 mg/dL Final  . HDL 84/69/6295 38.00* >39.00 mg/dL Final  . VLDL 28/41/3244 24.2  0.0 - 40.0 mg/dL Final  . LDL Cholesterol 02/21/2017 109* 0 - 99 mg/dL Final  . Total CHOL/HDL Ratio 02/21/2017 4   Final  . NonHDL 02/21/2017 132.98   Final    Other active problems: See review of systems   Allergies as of 02/23/2017      Reactions   Penicillins Anaphylaxis   Has patient had a PCN reaction causing immediate rash, facial/tongue/throat swelling, SOB or lightheadedness with hypotension: Yes Has patient had a PCN reaction causing severe rash involving mucus membranes or skin necrosis: No Has patient had a PCN reaction that required hospitalization Yes Has patient had a PCN reaction occurring within the last 10 years: No If all of the above answers are "NO", then may proceed with Cephalosporin use.   Sulfa Antibiotics Diarrhea      Medication List       Accurate as of 02/23/17 11:59 PM. Always use your most recent med list.          albuterol 108 (90 Base) MCG/ACT inhaler Commonly known as:  PROVENTIL HFA;VENTOLIN HFA Inhale 1 puff into the lungs every 6 (six) hours as needed for shortness of breath.   amLODipine 10 MG tablet Commonly known as:  NORVASC Take 10 mg by mouth daily.   azithromycin 250 MG tablet Commonly known as:  ZITHROMAX Take 1 tablet (250 mg total) by mouth daily. Take first 2 tablets together, then 1 every day until finished.   FREESTYLE LIBRE SENSOR SYSTEM Misc 1 strip by Does not apply route every 14 (fourteen) days.   glucose blood test strip Commonly known as:  FREESTYLE PRECISION NEO TEST 1 each by Other route as needed for other. Use to test blood sugar once daily   ibuprofen 600 MG tablet Commonly known as:   ADVIL,MOTRIN Take 1 tablet (600 mg total) by mouth every 6 (six) hours as needed.   Insulin Pen Needle 31G X 5 MM Misc Use to inject insulin once daily   INVOKANA 300 MG Tabs tablet Generic drug:  canagliflozin TAKE 1 TABLET (300 MG TOTAL) BY  MOUTH DAILY BEFORE BREAKFAST.   LANTUS SOLOSTAR 100 UNIT/ML Solostar Pen Generic drug:  Insulin Glargine INJECT 68 UNITS INTO THE SKIN EVERY MORNING.   levothyroxine 25 MCG tablet Commonly known as:  SYNTHROID, LEVOTHROID TAKE 1 TABLET BY MOUTH DAILY BEFORE BREAKFAST.   losartan 100 MG tablet Commonly known as:  COZAAR Take 100 mg by mouth daily.   metFORMIN 500 MG 24 hr tablet Commonly known as:  GLUCOPHAGE-XR TAKE 2 TABLETS BY MOUTH TWICE A DAY   OSPHENA 60 MG Tabs Generic drug:  Ospemifene Take 60 mg by mouth daily.   Semaglutide 0.25 or 0.5 MG/DOSE Sopn Commonly known as:  OZEMPIC Inject 0.5 mg into the skin once a week. Inject 0.5mg  weekly at the same time every week   simvastatin 20 MG tablet Commonly known as:  ZOCOR Take 20 mg by mouth daily.   VICTOZA 18 MG/3ML Sopn Generic drug:  liraglutide Inject 0.3 mLs (1.8 mg total) into the skin daily. Inject once daily at the same time       Allergies:  Allergies  Allergen Reactions  . Penicillins Anaphylaxis    Has patient had a PCN reaction causing immediate rash, facial/tongue/throat swelling, SOB or lightheadedness with hypotension: Yes Has patient had a PCN reaction causing severe rash involving mucus membranes or skin necrosis: No Has patient had a PCN reaction that required hospitalization Yes Has patient had a PCN reaction occurring within the last 10 years: No If all of the above answers are "NO", then may proceed with Cephalosporin use.   . Sulfa Antibiotics Diarrhea    Past Medical History:  Diagnosis Date  . Asthma   . Diabetes mellitus   . Hyperlipidemia   . Hypertension   . Thyroid disease    Small goiter, stable  . Vitamin D deficiency     Past  Surgical History:  Procedure Laterality Date  . TONSILLECTOMY      Family History  Problem Relation Age of Onset  . Diabetes Mother   . Diabetes Father   . Diabetes Maternal Aunt     Social History:  has an unknown smoking status. She has never used smokeless tobacco. She reports that she does not drink alcohol. Her drug history is not on file.    Review of Systems    Lipid history: Has low HDL and borderline LDL, on simvastatin 20 mg from PCP    Lab Results  Component Value Date   CHOL 171 02/21/2017   HDL 38.00 (L) 02/21/2017   LDLCALC 109 (H) 02/21/2017   TRIG 121.0 02/21/2017   CHOLHDL 4 02/21/2017           Hypertension: Treated with amlodipine and losartan100 mg  Most recent eye exam was 7/16  Most recent foot exam: 12/16  Thyroid: She has had a Nodular goiter since at least 2013 Thyroid biopsy was negative in 6/17 for a 1.9 cm isthmus  Nodule  HYPOTHYROIDISM: On her last visit in September2017 she was complaining about fatigue and some cold intolerance and her TSH was high at 5.5 She is since then empirically trying 25 g of levothyroxine and this had improved energy level  TSH is More recently normal   Lab Results  Component Value Date   TSH 3.50 01/18/2017   TSH 1.99 09/26/2016   TSH 5.49 (H) 08/11/2016   FREET4 0.83 01/18/2017   FREET4 0.80 09/26/2016     She is vitamin D deficient, advised 5000 units vitamin D 3 from PCP   Review  of Systems   LABS:  Lab on 02/21/2017  Component Date Value Ref Range Status  . Hgb A1c MFr Bld 02/21/2017 7.4* 4.6 - 6.5 % Final  . Sodium 02/21/2017 138  135 - 145 mEq/L Final  . Potassium 02/21/2017 4.3  3.5 - 5.1 mEq/L Final  . Chloride 02/21/2017 105  96 - 112 mEq/L Final  . CO2 02/21/2017 27  19 - 32 mEq/L Final  . Glucose, Bld 02/21/2017 129* 70 - 99 mg/dL Final  . BUN 11/91/4782 17  6 - 23 mg/dL Final  . Creatinine, Ser 02/21/2017 0.71  0.40 - 1.20 mg/dL Final  . Total Bilirubin 02/21/2017 0.5  0.2 -  1.2 mg/dL Final  . Alkaline Phosphatase 02/21/2017 70  39 - 117 U/L Final  . AST 02/21/2017 17  0 - 37 U/L Final  . ALT 02/21/2017 18  0 - 35 U/L Final  . Total Protein 02/21/2017 6.7  6.0 - 8.3 g/dL Final  . Albumin 95/62/1308 4.1  3.5 - 5.2 g/dL Final  . Calcium 65/78/4696 9.6  8.4 - 10.5 mg/dL Final  . GFR 29/52/8413 89.14  >60.00 mL/min Final  . Cholesterol 02/21/2017 171  0 - 200 mg/dL Final  . Triglycerides 02/21/2017 121.0  0.0 - 149.0 mg/dL Final  . HDL 24/40/1027 38.00* >39.00 mg/dL Final  . VLDL 25/36/6440 24.2  0.0 - 40.0 mg/dL Final  . LDL Cholesterol 02/21/2017 109* 0 - 99 mg/dL Final  . Total CHOL/HDL Ratio 02/21/2017 4   Final  . NonHDL 02/21/2017 132.98   Final    Physical Examination:  BP 126/78   Pulse 83   Wt 175 lb (79.4 kg)   SpO2 95%   BMI 32.01 kg/m    ASSESSMENT:  Diabetes type 2, uncontrolled with BMI 33 See history of present illness for detailed discussion of current diabetes management, blood sugar patterns and problems identified  She is currently on basal insulin, Ozempic along with Invokana and metformin A1c is now finally below 8% Not clear this is related to switching to Ozempic as yet However she is mostly having higher postprandial readings when he she goes off her diet or has more carbohydrate  With using the freestyle Libre sensor she is not able to see more clearly when her blood sugars are going up with various foods, highest readings aren't 300 with eating hot dogs She is trying to exercise with walking more and has lost 2 pounds, previously had desired a weight-loss drug  HYPERTENSION: Well controlled  LIPIDS: Will forward results to her PCP, recommend increasing Zocor to 40 mg or switching especially since she is on amlodipine also  PLAN:    She can watch her fasting readings over the next few days and if starting to get low normal she can reduce her Lantus  More consistent diet and avoid high carbohydrate or high-fat  meals  She will continue using the freestyle Libre sensor, co-pay card given  Advised him to avoid taking extra Lantus as this is a slow insulin  She'll continue 0.5 mg Ozempic weekly but consider increasing to 1 mg on the next visit, explained to her that she gets 2 mg in one pen  Continue walking regularly   Patient Instructions  Take 0.5 Ozempic weekly   Counseling time on subjects discussed above is over 50% of today's 25 minute visit   Natasha Chandler 02/25/2017, 3:20 PM   Note: This office note was prepared with Insurance underwriter. Any transcriptional errors that  result from this process are unintentional.

## 2017-02-27 ENCOUNTER — Telehealth: Payer: Self-pay | Admitting: Endocrinology

## 2017-02-27 ENCOUNTER — Other Ambulatory Visit: Payer: Self-pay | Admitting: Endocrinology

## 2017-02-27 NOTE — Telephone Encounter (Signed)
Patient needs refill on INVOKANA 300 MG TABS tablet more than 2 month.  [facility] [address] - [place], [place] - [address] [phone] (Phone) [phone])    Continuous Blood Gluc Sensor (FREESTYLE LIBRE SENSOR SYSTEM) MISC   Sensor is matching old meter, blood glucose is not matching meter. Patient needs to get advice on the sensor she has and needs glucose squeeze bottle tester.  Please call patient and advise.

## 2017-03-01 ENCOUNTER — Other Ambulatory Visit: Payer: Self-pay

## 2017-03-01 MED ORDER — FREESTYLE CONTROL SOLUTION VI LIQD: 2 refills | Status: DC

## 2017-03-05 NOTE — Telephone Encounter (Signed)
Pt has issues with the libre testing she is asking for comparison to be done in office with her old meter and the new meter. The 1st sensor for the [place] did great but when she used the 2nd one it seemed to have malfunctioned. She contacted the [place] office and they seemed to believe she caused the malfunction  Should she see Natasha Chandler?

## 2017-03-05 NOTE — Telephone Encounter (Signed)
Linda: Please discuss with her

## 2017-03-06 ENCOUNTER — Encounter: Payer: BLUE CROSS/BLUE SHIELD | Admitting: Nutrition

## 2017-03-06 NOTE — Telephone Encounter (Signed)
Pt. Reported that her sensor reading were 50 points higher than the sensor reading was showing that she wasdropping low, but meter says was 120s. She said that there were no arrows on the sensor sugar readings of the Libre  She called [employer], and talked with them and they explained to her that sensor/blood sugar readings are different.  She was using a One Touch Ultra, and I told her that this meter is not as accurate as the newer One Touch.  She also had a "very old" Verio IQ she was using.  I told her to stop using the Ultra meter, and gave her a new Verio IQ.  Reminded her to wash her hands and not test blood sugar if arrows are showing on the sensor readings, because the can vary more than 50 points.  She reported good understanding of this and had no final questions.

## 2017-03-09 ENCOUNTER — Other Ambulatory Visit: Payer: Self-pay

## 2017-03-09 MED ORDER — CANAGLIFLOZIN 300 MG PO TABS: ORAL_TABLET | ORAL | 3 refills | Status: DC

## 2017-03-09 NOTE — Telephone Encounter (Signed)
Hey Bonita Quin was only asking for advise on the sensor issue because I did not know what she was asking for but I was able to figure this out and send appropriate prescription

## 2017-03-30 ENCOUNTER — Telehealth: Payer: Self-pay | Admitting: Endocrinology

## 2017-03-30 NOTE — Telephone Encounter (Signed)
Cover my meds called they need a PA on medication Semaglutide (OZEMPIC) 0.25 or 0.5 MG/DOSE SOPN  Ref# [id number]  [phone]

## 2017-04-02 NOTE — Telephone Encounter (Signed)
PA for Ozempic submitted to covermy meds on 04/02/2017. Waiting on response.

## 2017-04-05 ENCOUNTER — Other Ambulatory Visit: Payer: Self-pay | Admitting: Endocrinology

## 2017-04-05 ENCOUNTER — Telehealth: Payer: Self-pay | Admitting: *Deleted

## 2017-04-05 MED ORDER — INSULIN GLARGINE 100 UNIT/ML SOLOSTAR PEN: PEN_INJECTOR | SUBCUTANEOUS | 1 refills | Status: DC

## 2017-04-05 NOTE — Telephone Encounter (Signed)
Refill submitted. 

## 2017-04-05 NOTE — Telephone Encounter (Signed)
Kumar patient 

## 2017-04-05 NOTE — Telephone Encounter (Signed)
Patient requesting a refill on the lantus solostar 100 units/ mg injectables.

## 2017-04-10 ENCOUNTER — Other Ambulatory Visit: Payer: Self-pay | Admitting: Endocrinology

## 2017-05-07 ENCOUNTER — Other Ambulatory Visit (INDEPENDENT_AMBULATORY_CARE_PROVIDER_SITE_OTHER): Payer: BLUE CROSS/BLUE SHIELD

## 2017-05-07 DIAGNOSIS — E1165 Type 2 diabetes mellitus with hyperglycemia: Secondary | ICD-10-CM | POA: Diagnosis not present

## 2017-05-07 DIAGNOSIS — Z794 Long term (current) use of insulin: Secondary | ICD-10-CM | POA: Diagnosis not present

## 2017-05-07 LAB — BASIC METABOLIC PANEL
BUN: 18 mg/dL (ref 6–23)
CALCIUM: 9.7 mg/dL (ref 8.4–10.5)
CHLORIDE: 103 meq/L (ref 96–112)
CO2: 28 mEq/L (ref 19–32)
CREATININE: 0.6 mg/dL (ref 0.40–1.20)
GFR: 108.18 mL/min (ref >60.00)
Glucose, Bld: 142 mg/dL — ABNORMAL HIGH (ref 70–99)
Potassium: 4.1 mEq/L (ref 3.5–5.1)
Sodium: 138 mEq/L (ref 135–145)

## 2017-05-07 LAB — MICROALBUMIN / CREATININE URINE RATIO
Creatinine,U: 152.6 mg/dL
Microalb Creat Ratio: 0.7 mg/g (ref 0.0–30.0)
Microalb, Ur: 1 mg/dL (ref 0.0–1.9)

## 2017-05-07 LAB — HEMOGLOBIN A1C: HEMOGLOBIN A1C: 7.4 % — AB (ref 4.6–6.5)

## 2017-05-09 NOTE — Progress Notes (Signed)
Patient ID: Natasha Chandler, female   DOB: 1956-08-16, 61 y.o.   MRN: [id number]           Reason for Appointment: F/u for Type 2 Diabetes  Referring physician: Cam Hai  History of Present Illness:          Date of diagnosis of type 2 diabetes mellitus: 2004       Background history:   She was treated with oral hypoglycemic drugs like metformin and first few years Insulin was started around 2008-9 but details are not available. She thinks she has taken Lantus for several years, at some point was also taking Humalog. Trulicity was started 1-2 years ago, taking Invokana 3 yrs  Over the last 2-3 years she has been having poor control and has had difficulty focusing on her diabetes because of family issues and periodically not having insurance  Recent history:   INSULIN regimen is: Lantus 55 in am    Non-insulin hypoglycemic drugs the patient is taking are: Metformin ER 1g bid, Invokana 300 mg daily, Ozempic 0.5 mg weekly   A1c is still about the same at 7.4, previously as high as 9.8  Current blood sugar patterns and problems identified:  She has continued using the freestyle Libre sensor in addition to her One Touch meter and is using this more often than the meter  However she thinks that the sensor is not always accurate and she has had occasional problems with the sensor  With increasing her Ozempic up to 0.5 mg on a regular basis she was starting to get low sugars overnight and then she reduced the dose back down; now she is taking a dose half a between 0.25 and 0.5  However she has been able to cut her Lantus insulin by about 13 units since last visit  HIGHEST blood sugars are after breakfast, she is eating cereal mostly and no protein  Blood sugars may be variably high after other meals but usually not spiking as much  OVERNIGHT blood sugars recently are trending down from an average of 150 at midnight and usually not below 100, fasting readings about 120  average  Because of hip pain and she has not been able to exercise and she thinks this may be causing higher readings also   Side effects from medications have been:none  Compliance with the medical regimen: Mostly good Hypoglycemia: as above  Glucose monitoring:  done 1-2 times a day         Glucometer: One Touch and freestyle Libre       Blood Glucose averages by download as below:  Mean values apply above for all meters except median for One Touch  PRE-MEAL Fasting Lunch Dinner Overnight  Overall  Glucose range:       Mean/median: 120  159  143  124  148+/-25    POST-MEAL PC Breakfast PC Lunch PC Dinner  Glucose range:     Mean/median: 187  147  159     Self-care: The diet that the patient has been following is: tries to limit fats.     Meal times are: Dinner: 7 pm   Typical meal intake: Breakfast is cereal, sometimes higher fat meals at lunch or dinner           Dietician visit, most recent: 4 years ago               Exercise:  not walking   Weight history: 162-187  Wt Readings from Last 3  Encounters:  05/10/17 181 lb 6.4 oz (82.3 kg)  02/23/17 175 lb (79.4 kg)  01/24/17 177 lb (80.3 kg)    Glycemic control:   Lab Results  Component Value Date   HGBA1C 7.4 (H) 05/07/2017   HGBA1C 7.4 (H) 02/21/2017   HGBA1C 8.0 (H) 11/20/2016   Lab Results  Component Value Date   MICROALBUR 1.0 05/07/2017   LDLCALC 109 (H) 02/21/2017   CREATININE 0.60 05/07/2017   Lab on 05/07/2017  Component Date Value Ref Range Status  . Hgb A1c MFr Bld 05/07/2017 7.4* 4.6 - 6.5 % Final   Glycemic Control Guidelines for People with Diabetes:Non Diabetic:  <6%Goal of Therapy: <7%Additional Action Suggested:  >8%   . Sodium 05/07/2017 138  135 - 145 mEq/L Final  . Potassium 05/07/2017 4.1  3.5 - 5.1 mEq/L Final  . Chloride 05/07/2017 103  96 - 112 mEq/L Final  . CO2 05/07/2017 28  19 - 32 mEq/L Final  . Glucose, Bld 05/07/2017 142* 70 - 99 mg/dL Final  . BUN 16/09/9603 18  6 - 23 mg/dL  Final  . Creatinine, Ser 05/07/2017 0.60  0.40 - 1.20 mg/dL Final  . Calcium 54/08/8118 9.7  8.4 - 10.5 mg/dL Final  . GFR 14/78/2956 108.18  >60.00 mL/min Final  . Microalb, Ur 05/07/2017 1.0  0.0 - 1.9 mg/dL Final  . Creatinine,U 21/30/8657 152.6  mg/dL Final  . Microalb Creat Ratio 05/07/2017 0.7  0.0 - 30.0 mg/g Final    Other active problems: See review of systems   Allergies as of 05/10/2017      Reactions   Penicillins Anaphylaxis   Has patient had a PCN reaction causing immediate rash, facial/tongue/throat swelling, SOB or lightheadedness with hypotension: Yes Has patient had a PCN reaction causing severe rash involving mucus membranes or skin necrosis: No Has patient had a PCN reaction that required hospitalization Yes Has patient had a PCN reaction occurring within the last 10 years: No If all of the above answers are "NO", then may proceed with Cephalosporin use.   Sulfa Antibiotics Diarrhea      Medication List       Accurate as of 05/10/17 11:47 AM. Always use your most recent med list.          albuterol 108 (90 Base) MCG/ACT inhaler Commonly known as:  PROVENTIL HFA;VENTOLIN HFA Inhale 1 puff into the lungs every 6 (six) hours as needed for shortness of breath.   amLODipine 10 MG tablet Commonly known as:  NORVASC Take 10 mg by mouth daily.   canagliflozin 300 MG Tabs tablet Commonly known as:  INVOKANA TAKE 1 TABLET (300 MG TOTAL) BY MOUTH DAILY BEFORE BREAKFAST.   FREESTYLE CONTROL SOLUTION Liqd Use to calibrate meter   FREESTYLE LIBRE SENSOR SYSTEM Misc 1 strip by Does not apply route every 14 (fourteen) days.   glucose blood test strip Commonly known as:  FREESTYLE PRECISION NEO TEST 1 each by Other route as needed for other. Use to test blood sugar once daily   ibuprofen 600 MG tablet Commonly known as:  ADVIL,MOTRIN Take 1 tablet (600 mg total) by mouth every 6 (six) hours as needed.   Insulin Glargine 100 UNIT/ML Solostar Pen Commonly known  as:  LANTUS SOLOSTAR INJECT 68 UNITS INTO THE SKIN EVERY MORNING.   Insulin Pen Needle 31G X 5 MM Misc Use to inject insulin once daily   levothyroxine 25 MCG tablet Commonly known as:  SYNTHROID, LEVOTHROID TAKE 1 TABLET BY MOUTH DAILY  BEFORE BREAKFAST.   losartan 100 MG tablet Commonly known as:  COZAAR Take 100 mg by mouth daily.   metFORMIN 500 MG 24 hr tablet Commonly known as:  GLUCOPHAGE-XR TAKE 2 TABLETS BY MOUTH TWICE A DAY   OSPHENA 60 MG Tabs Generic drug:  Ospemifene Take 60 mg by mouth daily.   Semaglutide 0.25 or 0.5 MG/DOSE Sopn Commonly known as:  OZEMPIC Inject 0.5 mg into the skin once a week. Inject 0.5mg  weekly at the same time every week   simvastatin 20 MG tablet Commonly known as:  ZOCOR Take 20 mg by mouth daily.       Allergies:  Allergies  Allergen Reactions  . Penicillins Anaphylaxis    Has patient had a PCN reaction causing immediate rash, facial/tongue/throat swelling, SOB or lightheadedness with hypotension: Yes Has patient had a PCN reaction causing severe rash involving mucus membranes or skin necrosis: No Has patient had a PCN reaction that required hospitalization Yes Has patient had a PCN reaction occurring within the last 10 years: No If all of the above answers are "NO", then may proceed with Cephalosporin use.   . Sulfa Antibiotics Diarrhea    Past Medical History:  Diagnosis Date  . Asthma   . Diabetes mellitus   . Hyperlipidemia   . Hypertension   . Thyroid disease    Small goiter, stable  . Vitamin D deficiency     Past Surgical History:  Procedure Laterality Date  . TONSILLECTOMY      Family History  Problem Relation Age of Onset  . Diabetes Mother   . Diabetes Father   . Diabetes Maternal Aunt     Social History:  reports that she has never smoked. She has never used smokeless tobacco. She reports that she does not drink alcohol. Her drug history is not on file.    Review of Systems    Lipid history:  Has low HDL and borderline LDL, on simvastatin 20 mg from PCP    Lab Results  Component Value Date   CHOL 171 02/21/2017   HDL 38.00 (L) 02/21/2017   LDLCALC 109 (H) 02/21/2017   TRIG 121.0 02/21/2017   CHOLHDL 4 02/21/2017           Hypertension: Treated with amlodipine and losartan100 mg  Most recent eye exam was 7/16  Most recent foot exam: 12/16  Thyroid: She has had a Nodular goiter since at least 2013 Thyroid biopsy was negative in 6/17 for a 1.9 cm isthmus  Nodule  HYPOTHYROIDISM: On her last visit in September2017 she was complaining about fatigue and some cold intolerance and her TSH was high at 5.5 She is since then empirically trying 25 g of levothyroxine and this had improved energy level  TSH is More recently normal   Lab Results  Component Value Date   TSH 3.50 01/18/2017   TSH 1.99 09/26/2016   TSH 5.49 (H) 08/11/2016   FREET4 0.83 01/18/2017   FREET4 0.80 09/26/2016     She is vitamin D deficient, advised 5000 units vitamin D 3 from PCP   Review of Systems   LABS:  Lab on 05/07/2017  Component Date Value Ref Range Status  . Hgb A1c MFr Bld 05/07/2017 7.4* 4.6 - 6.5 % Final   Glycemic Control Guidelines for People with Diabetes:Non Diabetic:  <6%Goal of Therapy: <7%Additional Action Suggested:  >8%   . Sodium 05/07/2017 138  135 - 145 mEq/L Final  . Potassium 05/07/2017 4.1  3.5 - 5.1  mEq/L Final  . Chloride 05/07/2017 103  96 - 112 mEq/L Final  . CO2 05/07/2017 28  19 - 32 mEq/L Final  . Glucose, Bld 05/07/2017 142* 70 - 99 mg/dL Final  . BUN 47/82/9562 18  6 - 23 mg/dL Final  . Creatinine, Ser 05/07/2017 0.60  0.40 - 1.20 mg/dL Final  . Calcium 13/07/6577 9.7  8.4 - 10.5 mg/dL Final  . GFR 46/96/2952 108.18  >60.00 mL/min Final  . Microalb, Ur 05/07/2017 1.0  0.0 - 1.9 mg/dL Final  . Creatinine,U 84/13/2440 152.6  mg/dL Final  . Microalb Creat Ratio 05/07/2017 0.7  0.0 - 30.0 mg/g Final    Physical Examination:  BP 128/88   Pulse 82    Ht 5\' 2"  (1.575 m)   Wt 181 lb 6.4 oz (82.3 kg)   SpO2 98%   BMI 33.18 kg/m    ASSESSMENT:  Diabetes type 2, uncontrolled with BMI 33 See history of present illness for detailed discussion of current diabetes management, blood sugar patterns and problems identified  She is currently on basal insulin, Ozempic along with Invokana and metformin A1c is now finally below 8% Not clear this is related to switching to Ozempic as yet However she is mostly having higher postprandial readings when he she goes off her diet or has more carbohydrate  With using the freestyle Libre sensor she is not able to see more clearly when her blood sugars are going up with various foods, highest readings aren't 300 with eating hot dogs She is trying to exercise with walking more and has lost 2 pounds, previously had desired a weight-loss drug  HYPERTENSION: Well controlled  LIPIDS: Will forward results to her PCP, recommend increasing Zocor to 40 mg or switching especially since she is on amlodipine also  PLAN:    She can watch her fasting readings over the next few days and if starting to get low normal she can reduce her Lantus  More consistent diet and avoid high carbohydrate or high-fat meals  She will continue using the freestyle Libre sensor, co-pay card given  Advised  to avoid taking extra Lantus as this is a slow insulin  She'll continue 0.5 mg Ozempic weekly but consider increasing to 1 mg on the next visit, explained to her that she gets 2 mg in one pen  Continue walking regularly   Patient Instructions  More protein at bfst  50 U insulin and 0.5mg  weekly, adjust insulin to keep am sugar 80-120    Counseling time on subjects discussed above is over 50% of today's 25 minute visit   Gilma Bessette 05/10/2017, 11:47 AM   Note: This office note was prepared with Insurance underwriter. Any transcriptional errors that result from this process are unintentional.   Patient ID: Natasha Chandler, female   DOB: 07-01-1956, 61 y.o.   MRN: [id number]           Reason for Appointment: F/u for Type 2 Diabetes  Referring physician: Cam Hai  History of Present Illness:          Date of diagnosis of type 2 diabetes mellitus: 2004       Background history:   She was treated with oral hypoglycemic drugs like metformin and first few years Insulin was started around 2008-9 but details are not available. She thinks she has taken Lantus for several years, at some point was also taking Humalog. Trulicity was started 1-2 years ago, taking Invokana 3 yrs  Over the  last 2-3 years she has been having poor control and has had difficulty focusing on her diabetes because of family issues and periodically not having insurance  Recent history:   INSULIN regimen is: Lantus 68 in am    Non-insulin hypoglycemic drugs the patient is taking are: Metformin ER 1g bid, Invokana 300 mg daily, Victoza 1.8mg    A1c is now relatively better at 7.4, previously as high as 9.8  Current blood sugar patterns and problems identified:  She has only in the last 3-4 days started using the freestyle Libre sensor in addition to her One Touch meter  Again usually on her monitor checking readings fasting and not after meals as much  She has started using the Ozempic about a month ago and did take at least 2 injections of the 0.5 dosage, she is tolerating this well and was able to get this with her co-pay card although with some difficulty  She has not had much change in her blood sugars on her monitor as yet although does not have any consistently high post pen  If she did have a significantly high reading of over 300 yesterday afternoon after eating 2 hot dogs  However she took 10 units Lantus extra yesterday and got hypoglycemic last night but blood sugar down to 69 and she treated it with a lot of food  Her overnight blood sugars are otherwise on her continuous monitor around  140  Fasting blood sugars on her fingersticks are variable ranging from 77-165  Postprandial readings otherwise have been occasionally around 180-200   Side effects from medications have been:none  Compliance with the medical regimen: Mostly good Hypoglycemia: Only once as above  Glucose monitoring:  done 1-2 times a day         Glucometer: One Touch and freestyle Libre       Blood Glucose readings by download as above:  Median blood sugar on her fingersticks overall 112  Self-care: The diet that the patient has been following is: tries to limit fats.     Meal times are: Dinner: 7 pm   Typical meal intake: Breakfast is cereal or eggs, sometimes higher fat meals           Dietician visit, most recent: 4 years ago               Exercise: walking more recently  Weight history: 162-187  Wt Readings from Last 3 Encounters:  05/10/17 181 lb 6.4 oz (82.3 kg)  02/23/17 175 lb (79.4 kg)  01/24/17 177 lb (80.3 kg)    Glycemic control:   Lab Results  Component Value Date   HGBA1C 7.4 (H) 05/07/2017   HGBA1C 7.4 (H) 02/21/2017   HGBA1C 8.0 (H) 11/20/2016   Lab Results  Component Value Date   MICROALBUR 1.0 05/07/2017   LDLCALC 109 (H) 02/21/2017   CREATININE 0.60 05/07/2017   Lab on 05/07/2017  Component Date Value Ref Range Status  . Hgb A1c MFr Bld 05/07/2017 7.4* 4.6 - 6.5 % Final   Glycemic Control Guidelines for People with Diabetes:Non Diabetic:  <6%Goal of Therapy: <7%Additional Action Suggested:  >8%   . Sodium 05/07/2017 138  135 - 145 mEq/L Final  . Potassium 05/07/2017 4.1  3.5 - 5.1 mEq/L Final  . Chloride 05/07/2017 103  96 - 112 mEq/L Final  . CO2 05/07/2017 28  19 - 32 mEq/L Final  . Glucose, Bld 05/07/2017 142* 70 - 99 mg/dL Final  . BUN 16/09/9603 18  6 -  23 mg/dL Final  . Creatinine, Ser 05/07/2017 0.60  0.40 - 1.20 mg/dL Final  . Calcium 16/10/960406/03/2017 9.7  8.4 - 10.5 mg/dL Final  . GFR 54/09/811906/03/2017 108.18  >60.00 mL/min Final  . Microalb, Ur 05/07/2017 1.0   0.0 - 1.9 mg/dL Final  . Creatinine,U 14/78/295606/03/2017 152.6  mg/dL Final  . Microalb Creat Ratio 05/07/2017 0.7  0.0 - 30.0 mg/g Final    Other active problems: See review of systems   Allergies as of 05/10/2017      Reactions   Penicillins Anaphylaxis   Has patient had a PCN reaction causing immediate rash, facial/tongue/throat swelling, SOB or lightheadedness with hypotension: Yes Has patient had a PCN reaction causing severe rash involving mucus membranes or skin necrosis: No Has patient had a PCN reaction that required hospitalization Yes Has patient had a PCN reaction occurring within the last 10 years: No If all of the above answers are "NO", then may proceed with Cephalosporin use.   Sulfa Antibiotics Diarrhea      Medication List       Accurate as of 05/10/17 11:47 AM. Always use your most recent med list.          albuterol 108 (90 Base) MCG/ACT inhaler Commonly known as:  PROVENTIL HFA;VENTOLIN HFA Inhale 1 puff into the lungs every 6 (six) hours as needed for shortness of breath.   amLODipine 10 MG tablet Commonly known as:  NORVASC Take 10 mg by mouth daily.   canagliflozin 300 MG Tabs tablet Commonly known as:  INVOKANA TAKE 1 TABLET (300 MG TOTAL) BY MOUTH DAILY BEFORE BREAKFAST.   FREESTYLE CONTROL SOLUTION Liqd Use to calibrate meter   FREESTYLE LIBRE SENSOR SYSTEM Misc 1 strip by Does not apply route every 14 (fourteen) days.   glucose blood test strip Commonly known as:  FREESTYLE PRECISION NEO TEST 1 each by Other route as needed for other. Use to test blood sugar once daily   ibuprofen 600 MG tablet Commonly known as:  ADVIL,MOTRIN Take 1 tablet (600 mg total) by mouth every 6 (six) hours as needed.   Insulin Glargine 100 UNIT/ML Solostar Pen Commonly known as:  LANTUS SOLOSTAR INJECT 68 UNITS INTO THE SKIN EVERY MORNING.   Insulin Pen Needle 31G X 5 MM Misc Use to inject insulin once daily   levothyroxine 25 MCG tablet Commonly known as:   SYNTHROID, LEVOTHROID TAKE 1 TABLET BY MOUTH DAILY BEFORE BREAKFAST.   losartan 100 MG tablet Commonly known as:  COZAAR Take 100 mg by mouth daily.   metFORMIN 500 MG 24 hr tablet Commonly known as:  GLUCOPHAGE-XR TAKE 2 TABLETS BY MOUTH TWICE A DAY   OSPHENA 60 MG Tabs Generic drug:  Ospemifene Take 60 mg by mouth daily.   Semaglutide 0.25 or 0.5 MG/DOSE Sopn Commonly known as:  OZEMPIC Inject 0.5 mg into the skin once a week. Inject 0.5mg  weekly at the same time every week   simvastatin 20 MG tablet Commonly known as:  ZOCOR Take 20 mg by mouth daily.       Allergies:  Allergies  Allergen Reactions  . Penicillins Anaphylaxis    Has patient had a PCN reaction causing immediate rash, facial/tongue/throat swelling, SOB or lightheadedness with hypotension: Yes Has patient had a PCN reaction causing severe rash involving mucus membranes or skin necrosis: No Has patient had a PCN reaction that required hospitalization Yes Has patient had a PCN reaction occurring within the last 10 years: No If all of the above  answers are "NO", then may proceed with Cephalosporin use.   . Sulfa Antibiotics Diarrhea    Past Medical History:  Diagnosis Date  . Asthma   . Diabetes mellitus   . Hyperlipidemia   . Hypertension   . Thyroid disease    Small goiter, stable  . Vitamin D deficiency     Past Surgical History:  Procedure Laterality Date  . TONSILLECTOMY      Family History  Problem Relation Age of Onset  . Diabetes Mother   . Diabetes Father   . Diabetes Maternal Aunt     Social History:  reports that she has never smoked. She has never used smokeless tobacco. She reports that she does not drink alcohol. Her drug history is not on file.    Review of Systems    Lipid history: Has low HDL and borderline LDL, on simvastatin 20 mg from PCP, this has not been increased as recommended    Lab Results  Component Value Date   CHOL 171 02/21/2017   HDL 38.00 (L)  02/21/2017   LDLCALC 109 (H) 02/21/2017   TRIG 121.0 02/21/2017   CHOLHDL 4 02/21/2017           Hypertension: Treated with amlodipine and losartan100 mg, Blood pressure may be higher because of pain recently  Most recent eye exam was 7/16  Most recent foot exam: 12/16  Thyroid: She has had a Nodular goiter since at least 2013 Thyroid biopsy was negative in 6/17 for a 1.9 cm isthmus  Nodule  HYPOTHYROIDISM: On her visit in September2017 she was complaining about fatigue and some cold intolerance and her TSH was high at 5.5 She is since then empirically trying 25 g of levothyroxine and this had improved energy level  TSH is subsequently normal   Lab Results  Component Value Date   TSH 3.50 01/18/2017   TSH 1.99 09/26/2016   TSH 5.49 (H) 08/11/2016   FREET4 0.83 01/18/2017   FREET4 0.80 09/26/2016     She is vitamin D deficient, advised 5000 units vitamin D 3 from PCP   Review of Systems   LABS:  Lab on 05/07/2017  Component Date Value Ref Range Status  . Hgb A1c MFr Bld 05/07/2017 7.4* 4.6 - 6.5 % Final   Glycemic Control Guidelines for People with Diabetes:Non Diabetic:  <6%Goal of Therapy: <7%Additional Action Suggested:  >8%   . Sodium 05/07/2017 138  135 - 145 mEq/L Final  . Potassium 05/07/2017 4.1  3.5 - 5.1 mEq/L Final  . Chloride 05/07/2017 103  96 - 112 mEq/L Final  . CO2 05/07/2017 28  19 - 32 mEq/L Final  . Glucose, Bld 05/07/2017 142* 70 - 99 mg/dL Final  . BUN 16/09/9603 18  6 - 23 mg/dL Final  . Creatinine, Ser 05/07/2017 0.60  0.40 - 1.20 mg/dL Final  . Calcium 54/08/8118 9.7  8.4 - 10.5 mg/dL Final  . GFR 14/78/2956 108.18  >60.00 mL/min Final  . Microalb, Ur 05/07/2017 1.0  0.0 - 1.9 mg/dL Final  . Creatinine,U 21/30/8657 152.6  mg/dL Final  . Microalb Creat Ratio 05/07/2017 0.7  0.0 - 30.0 mg/g Final    Physical Examination:  BP 128/88   Pulse 82   Ht 5\' 2"  (1.575 m)   Wt 181 lb 6.4 oz (82.3 kg)   SpO2 98%   BMI 33.18 kg/m     ASSESSMENT:  Diabetes type 2, uncontrolled with BMI 33 See history of present illness for detailed discussion of  current diabetes management, blood sugar patterns and problems identified  She is currently on basal insulin, Ozempic along with Invokana and metformin  Although her A1c is stable at 7.4 she is having some postprandial hyperglycemia, mostly after breakfast Ozempic does help her glycemic control and reduce her insulin requirement but she is not using the 0.5 mg because of tendency to hypoglycemia and not reducing the insulin appropriately Discussed that Ozempic itself is not causing hypoglycemia and she needs to progressively reduce her Lantus to prevent overnight hypoglycemia Currently is not exercising which is also not helping her control She does need a different meal at breakfast instead of cereal with more protein as this is the more consistent time when she has reading symptoms over 200 Overall she is depending on the freestyle Libre sensor for her blood sugar monitoring although she thinks it is not always accurate and has had some technical issues also with this  Weight gain: Likely to be from recent inactivity and also from tendency to hypoglycemia when she was taking higher doses of insulin, this was discussed  HYPERTENSION: Diastolic high normal, possibly from recent pain  PLAN:    She can reduce her Lantus to 50 units and go back up on the Ozempic to the 0.5 mg dose  She will need to periodically assess her Lantus dose based on her overnight blood sugars but may be able to progressively reduce the doses especially when she starts walking again  Discussed options for using various higher protein meals in the morning instead of cereal  Also she would try to keep consistently low fat meals at lunch and dinner to prevent blood sugar spikes  Since she does not have any consistent hyperglycemia after meals does not need mealtime insulin as yet, she is reluctant to  do this also  Continue Invokana and metformin  Follow-up with PCP for hypertension  Recheck TSH on next visit for mild hypothyroidism  Patient Instructions  More protein at bfst  50 U insulin and 0.5mg  weekly, adjust insulin to keep am sugar 80-120    Counseling time on subjects discussed above is over 50% of today's 25 minute visit   Dorance Spink 05/10/2017, 11:47 AM   Note: This office note was prepared with Insurance underwriter. Any transcriptional errors that result from this process are unintentional.

## 2017-05-10 ENCOUNTER — Ambulatory Visit (INDEPENDENT_AMBULATORY_CARE_PROVIDER_SITE_OTHER): Payer: BLUE CROSS/BLUE SHIELD | Admitting: Endocrinology

## 2017-05-10 ENCOUNTER — Encounter: Payer: Self-pay | Admitting: Endocrinology

## 2017-05-10 VITALS — BP 128/88 | HR 82 | Ht 62.0 in | Wt 181.4 lb

## 2017-05-10 DIAGNOSIS — E063 Autoimmune thyroiditis: Secondary | ICD-10-CM

## 2017-05-10 DIAGNOSIS — E1165 Type 2 diabetes mellitus with hyperglycemia: Secondary | ICD-10-CM | POA: Diagnosis not present

## 2017-05-10 DIAGNOSIS — I10 Essential (primary) hypertension: Secondary | ICD-10-CM

## 2017-05-10 DIAGNOSIS — Z794 Long term (current) use of insulin: Secondary | ICD-10-CM | POA: Diagnosis not present

## 2017-05-10 NOTE — Patient Instructions (Signed)
More protein at bfst  50 U insulin and 0.5mg  weekly, adjust insulin to keep am sugar 80-120

## 2017-05-23 ENCOUNTER — Encounter (HOSPITAL_COMMUNITY): Payer: Self-pay

## 2017-05-23 ENCOUNTER — Emergency Department (HOSPITAL_COMMUNITY): Payer: BLUE CROSS/BLUE SHIELD

## 2017-05-23 ENCOUNTER — Ambulatory Visit (INDEPENDENT_AMBULATORY_CARE_PROVIDER_SITE_OTHER): Payer: BLUE CROSS/BLUE SHIELD | Admitting: Physician Assistant

## 2017-05-23 ENCOUNTER — Ambulatory Visit (INDEPENDENT_AMBULATORY_CARE_PROVIDER_SITE_OTHER): Payer: BLUE CROSS/BLUE SHIELD

## 2017-05-23 ENCOUNTER — Emergency Department (HOSPITAL_COMMUNITY)
Admission: EM | Admit: 2017-05-23 | Discharge: 2017-05-23 | Disposition: A | Discharge status: Home / Self Care | Payer: BLUE CROSS/BLUE SHIELD | Attending: Emergency Medicine | Admitting: Emergency Medicine

## 2017-05-23 ENCOUNTER — Telehealth (INDEPENDENT_AMBULATORY_CARE_PROVIDER_SITE_OTHER): Payer: Self-pay | Admitting: Radiology

## 2017-05-23 ENCOUNTER — Encounter (INDEPENDENT_AMBULATORY_CARE_PROVIDER_SITE_OTHER): Payer: Self-pay | Admitting: Physician Assistant

## 2017-05-23 DIAGNOSIS — M25552 Pain in left hip: Secondary | ICD-10-CM | POA: Diagnosis not present

## 2017-05-23 DIAGNOSIS — Z794 Long term (current) use of insulin: Secondary | ICD-10-CM | POA: Diagnosis not present

## 2017-05-23 DIAGNOSIS — M1612 Unilateral primary osteoarthritis, left hip: Secondary | ICD-10-CM | POA: Diagnosis not present

## 2017-05-23 DIAGNOSIS — J45909 Unspecified asthma, uncomplicated: Secondary | ICD-10-CM | POA: Insufficient documentation

## 2017-05-23 DIAGNOSIS — I10 Essential (primary) hypertension: Secondary | ICD-10-CM | POA: Diagnosis not present

## 2017-05-23 DIAGNOSIS — E119 Type 2 diabetes mellitus without complications: Secondary | ICD-10-CM | POA: Insufficient documentation

## 2017-05-23 DIAGNOSIS — Z79899 Other long term (current) drug therapy: Secondary | ICD-10-CM | POA: Diagnosis not present

## 2017-05-23 MED ORDER — TRIAMCINOLONE ACETONIDE 40 MG/ML IJ SUSP
80.0000 mg | INTRAMUSCULAR | Status: AC | PRN
  Administered 2017-05-23: 80 mg via INTRA_ARTICULAR

## 2017-05-23 MED ORDER — BUPIVACAINE HCL 0.5 % IJ SOLN
3.0000 mL | INTRAMUSCULAR | Status: AC | PRN
  Administered 2017-05-23: 3 mL via INTRA_ARTICULAR

## 2017-05-23 NOTE — Progress Notes (Signed)
Office Visit Note   Patient: Natasha Chandler           Date of Birth: Dec 26, 1955           MRN: [id number] Visit Date: 05/23/2017              Requested by: [name], MD [address] [place], [place] [address] PCP: [name], MD   Assessment & Plan: Visit Diagnoses:  1. Pain in left hip     Plan: She will undergo an intra-articular injection of left hip without any today. Follow-up with Dr. Magnus Ivan myself pain persist or become worse despite being intra-articular injection.  Follow-Up Instructions: Return if symptoms worsen or fail to improve, for Natasha Edison, PA.   Orders:  Orders Placed This Encounter  Procedures  . Large Joint Injection/Arthrocentesis  . XR C-ARM NO REPORT   No orders of the defined types were placed in this encounter.     Procedures: No procedures performed   Clinical Data: No additional findings.   Subjective: Chief Complaint  Patient presents with  . Left Hip - Pain    She would like an injection the left hip today    HPI This Natasha Chandler is a 61 year old female comes in today wanting a intra-articular injection of her left hip. She had previous injection with Dr. [name] approximately 2 years ago due to her mild hip arthritis that was evaluated by MRI. She did well until recently. She's had no recent injury to the hip. Review of Systems No fevers chills shortness breath calf pain chest pain. Otherwise see history of present illness  Objective: Vital Signs: There were no vitals taken for this visit.  Physical Exam  Constitutional: She is oriented to person, place, and time. She appears well-developed and well-nourished.  Pulmonary/Chest: Effort normal.  Neurological: She is alert and oriented to person, place, and time.  Psychiatric: Her behavior is normal.    Ortho Exam left hip she has painful internal rotation. External rotation not particularly painful and normal range of motion. Slight tenderness over the left  trochanteric region.   Specialty Comments:  No specialty comments available.  Imaging: Xr C-arm No Report  Result Date: 05/23/2017 Please see Notes or Procedures tab for imaging impression.  Dg Hip Unilat W Or Wo Pelvis 2-3 Views Left  Result Date: 05/23/2017 CLINICAL DATA:  Moderate left hip pain for approximately 2 weeks. No known injury. EXAM: DG HIP (WITH OR WITHOUT PELVIS) 2-3V LEFT COMPARISON:  None. FINDINGS: No acute bony or joint abnormality is seen. No evidence of avascular necrosis of the femoral heads. Mild joint space narrowing of both hips is identified. Surrounding osseous and soft tissue structures are unremarkable. IMPRESSION: Mild bilateral hip degenerative change.  Otherwise negative. Electronically Signed   By: [name] M.D.   On: 05/23/2017 13:02     PMFS History: Patient Active Problem List   Diagnosis Date Noted  . Uncontrolled type 2 diabetes mellitus with hyperglycemia, with long-term current use of insulin (HCC) 09/28/2016  . 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 Pure hypercholesterolemia 02/18/2016  . Plantar fasciitis 07/05/2011  . Asthma 07/05/2011  . Multinodular goiter 07/05/2011  . Hypertension 07/05/2011   Past Medical History:  Diagnosis Date  . Asthma   . Diabetes mellitus   . Hyperlipidemia   . Hypertension   . Thyroid disease    Small goiter, stable  . Vitamin D deficiency     Family History  Problem Relation Age of  Onset  . Diabetes Mother   . Diabetes Father   . Diabetes Maternal Aunt     Past Surgical History:  Procedure Laterality Date  . TONSILLECTOMY     Social History   Occupational History  . Not on file.   Social History Main Topics  . Smoking status: Never Smoker  . Smokeless tobacco: Never Used  . Alcohol use No  . Drug use: Unknown  . Sexual activity: Not on file

## 2017-05-23 NOTE — Progress Notes (Signed)
[name] - 61 y.o. female MRN [id number]  Date of birth: 1956/03/12  Office Visit Note: Visit Date: 05/23/2017 PCP: Lupita RaiderShaw, Kimberlee, MD Referred by: Lupita RaiderShaw, Kimberlee, MD  Subjective: Chief Complaint  Patient presents with  . Left Hip - Pain    She would like an injection the left hip today   HPI: Mrs. Natasha Chandler is a very pleasant 61-year-old female with chronic worsening left hip and groin pain. She has a history of FAI. She saw Rexene EdisonGil Clark today and evaluation for her hip pain and he requested work and hip injection with fluoroscopic guidance. She was also seen in the ER today due to significant increased pain. She's had a prior hip injection with good relief for several months.    ROS Otherwise per HPI.  Assessment & Plan: Visit Diagnoses: No diagnosis found.  Plan: Findings:  Diagnostic and hopefully therapeutic left intra-articular hip injection fluoroscopic guidance. Is excellent flow of contrast the patient did have some relief can anesthetic phase. At least when she first got up with me after the injection it didn't seem to be bothering her that much but she has been to the emergency room today for severe pain.    Meds & Orders: No orders of the defined types were placed in this encounter.  No orders of the defined types were placed in this encounter.   Follow-up: No Follow-up on file.   Procedures: Hip intra-articular injection Date/Time: 05/23/2017 3:11 PM Performed by: Tyrell AntonioNEWTON, Sherle Mello Authorized by: Tyrell AntonioNEWTON, Joshus Rogan   Consent Given by:  Patient Site marked: the procedure site was marked   Timeout: prior to procedure the correct patient, procedure, and site was verified   Indications:  Pain and diagnostic evaluation Location:  Hip Site:  L hip joint Prep: patient was prepped and draped in usual sterile fashion   Needle Size:  22 G Needle Length:  3.5 inches Approach:  Anterior Ultrasound Guidance: No   Fluoroscopic Guidance: Yes   Arthrogram: No   Medications:   3 mL bupivacaine 0.5 %; 80 mg triamcinolone acetonide 40 MG/ML Aspiration Attempted: Yes   Patient tolerance:  Patient tolerated the procedure well with no immediate complications  There was excellent flow of contrast producing a partial arthrogram of the hip. The patient did have some relief of symptoms during the anesthetic phase of the injection.     No notes on file   Clinical History: No specialty comments available.  She reports that she has never smoked. She has never used smokeless tobacco.  Recent Labs  11/20/16 0835 02/21/17 0806 05/07/17 0815  HGBA1C 8.0* 7.4* 7.4*    Objective:  VS:  HT:    WT:   BMI:     BP:   HR: bpm  TEMP: ( )  RESP:  Physical Exam  Musculoskeletal:  Painful range of motion of the left hip.    Ortho Exam Imaging: Dg Hip Unilat W Or Wo Pelvis 2-3 Views Left  Result Date: 05/23/2017 CLINICAL DATA:  Moderate left hip pain for approximately 2 weeks. No known injury. EXAM: DG HIP (WITH OR WITHOUT PELVIS) 2-3V LEFT COMPARISON:  None. FINDINGS: No acute bony or joint abnormality is seen. No evidence of avascular necrosis of the femoral heads. Mild joint space narrowing of both hips is identified. Surrounding osseous and soft tissue structures are unremarkable. IMPRESSION: Mild bilateral hip degenerative change.  Otherwise negative. Electronically Signed   By: Drusilla Kannerhomas  Dalessio M.D.   On: 05/23/2017 13:02    Past Medical/Family/Surgical/Social  History: Medications & Allergies reviewed per EMR Patient Active Problem List   Diagnosis Date Noted  . Uncontrolled type 2 diabetes mellitus with hyperglycemia, with long-term current use of insulin (HCC) 09/28/2016  . 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 Pure hypercholesterolemia 02/18/2016  . Plantar fasciitis 07/05/2011  . Asthma 07/05/2011  . Multinodular goiter 07/05/2011  . Hypertension 07/05/2011   Past Medical History:  Diagnosis Date  . Asthma   . Diabetes mellitus   .  Hyperlipidemia   . Hypertension   . Thyroid disease    Small goiter, stable  . Vitamin D deficiency    Family History  Problem Relation Age of Onset  . Diabetes Mother   . Diabetes Father   . Diabetes Maternal Aunt    Past Surgical History:  Procedure Laterality Date  . TONSILLECTOMY     Social History   Occupational History  . Not on file.   Social History Main Topics  . Smoking status: Never Smoker  . Smokeless tobacco: Never Used  . Alcohol use No  . Drug use: Unknown  . Sexual activity: Not on file

## 2017-05-23 NOTE — ED Provider Notes (Signed)
[facility]-EMERGENCY DEPT Provider Note   CSN: [id number] Arrival date & time: 05/23/17  1036  By signing my name below, I, Modena JanskyAlbert Thayil, attest that this documentation has been prepared under the direction and in the presence of non-physician practitioner, Eyvonne MechanicJeffrey Phillips Goulette, PA-C. Electronically Signed: Modena JanskyAlbert Thayil, Scribe. 05/23/2017. 12:18 PM.  History   Chief Complaint Chief Complaint  Patient presents with  . Back Pain   The history is provided by the patient. No language interpreter was used.   HPI Comments:  Natasha Chandler is a 61 y.o. female who presents to the Emergency Department complaining of constant moderate left hip pain that started about 2 weeks ago. She has a prior hx of hip pain that was resolved with a cortisone injection 2.5 years ago. She states she woke up in the morning with pain without injury. She has been taking aspirin and a single hydrocodone leftover from prior prescription. Her pain is exacerbated by movement. She describes the pain as a sharp sensation radiating to her back. Denies any hx of hip surgery, fever, weakness, numbness/tingling, or other complaints at this time.  Past Medical History:  Diagnosis Date  . Asthma   . Diabetes mellitus   . Hyperlipidemia   . Hypertension   . Thyroid disease    Small goiter, stable  . Vitamin D deficiency     Patient Active Problem List   Diagnosis Date Noted  . Uncontrolled type 2 diabetes mellitus with hyperglycemia, with long-term current use of insulin (HCC) 09/28/2016  . 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 Pure hypercholesterolemia 02/18/2016  . Plantar fasciitis 07/05/2011  . Asthma 07/05/2011  . Multinodular goiter 07/05/2011  . Hypertension 07/05/2011    Past Surgical History:  Procedure Laterality Date  . TONSILLECTOMY      OB History    No data available       Home Medications    Prior to Admission medications   Medication Sig Start Date End Date Taking? Authorizing Provider    albuterol (PROVENTIL HFA;VENTOLIN HFA) 108 (90 Base) MCG/ACT inhaler Inhale 1 puff into the lungs every 6 (six) hours as needed for shortness of breath.  11/26/16   [provider]  amLODipine (NORVASC) 10 MG tablet Take 10 mg by mouth daily.    [provider]  Blood Glucose Calibration (FREESTYLE CONTROL SOLUTION) LIQD Use to calibrate meter 03/01/17   Reather LittlerKumar, Ajay, MD  canagliflozin (INVOKANA) 300 MG TABS tablet TAKE 1 TABLET (300 MG TOTAL) BY MOUTH DAILY BEFORE BREAKFAST. 03/09/17   Reather LittlerKumar, Ajay, MD  Continuous Blood Gluc Sensor (FREESTYLE LIBRE SENSOR SYSTEM) MISC 1 strip by Does not apply route every 14 (fourteen) days. 01/25/17   Reather LittlerKumar, Ajay, MD  glucose blood (FREESTYLE PRECISION NEO TEST) test strip 1 each by Other route as needed for other. Use to test blood sugar once daily 02/21/17   Reather LittlerKumar, Ajay, MD  ibuprofen (ADVIL,MOTRIN) 600 MG tablet Take 1 tablet (600 mg total) by mouth every 6 (six) hours as needed. Patient taking differently: Take 600 mg by mouth every 6 (six) hours as needed for moderate pain.  11/27/15   Horton, Mayer Maskerourtney F, MD  Insulin Glargine (LANTUS SOLOSTAR) 100 UNIT/ML Solostar Pen INJECT 68 UNITS INTO THE SKIN EVERY MORNING. Patient taking differently: INJECT 52-55 UNITS INTO THE SKIN EVERY MORNING. 04/05/17   Reather LittlerKumar, Ajay, MD  Insulin Pen Needle 31G X 5 MM MISC Use to inject insulin once daily 01/25/17   Reather LittlerKumar, Ajay, MD  levothyroxine (SYNTHROID, LEVOTHROID) 25 MCG tablet  TAKE 1 TABLET BY MOUTH DAILY BEFORE BREAKFAST. 02/19/17   Reather Littler, MD  losartan (COZAAR) 100 MG tablet Take 100 mg by mouth daily.    [provider]  metFORMIN (GLUCOPHAGE-XR) 500 MG 24 hr tablet TAKE 2 TABLETS BY MOUTH TWICE A DAY Patient taking differently: TAKE 2 TABLETS BY MOUTH IN THE MORNING AND TAKES ONE TABLET AT NIGHT 01/10/17   Reather Littler, MD  Ospemifene (OSPHENA) 60 MG TABS Take 60 mg by mouth daily.     [provider]  Semaglutide (OZEMPIC) 0.25 or 0.5 MG/DOSE  SOPN Inject 0.5 mg into the skin once a week. Inject 0.5mg  weekly at the same time every week 02/02/17   Reather Littler, MD  simvastatin (ZOCOR) 20 MG tablet Take 20 mg by mouth daily.    [provider]    Family History Family History  Problem Relation Age of Onset  . Diabetes Mother   . Diabetes Father   . Diabetes Maternal Aunt     Social History Social History  Substance Use Topics  . Smoking status: Never Smoker  . Smokeless tobacco: Never Used  . Alcohol use No     Allergies   Penicillins and Sulfa antibiotics   Review of Systems Review of Systems  Constitutional: Negative for fever.  Musculoskeletal: Positive for back pain.  Neurological: Negative for weakness and numbness.  All other systems reviewed and are negative.    Physical Exam Updated Vital Signs BP (!) 146/69 (BP Location: Left Arm)   Pulse 63   Temp 97.7 F (36.5 C) (Oral)   Resp 16   Ht 5\' 2"  (1.575 m)   Wt 179 lb (81.2 kg)   SpO2 98%   BMI 32.74 kg/m   Physical Exam  Constitutional: She appears well-developed and well-nourished. No distress.  HENT:  Head: Normocephalic.  Eyes: Conjunctivae are normal.  Neck: Neck supple.  Cardiovascular: Normal rate and regular rhythm.   Pulmonary/Chest: Effort normal.  Abdominal: Soft.  Musculoskeletal: Normal range of motion.  No swelling or obvious deformity to the left hip. No warmth to touch. TTP over the left lateral hip. Distal motor function and sensation intact.  No CT or L-spine tenderness.  Neurological: She is alert.  Skin: Skin is warm and dry.  Psychiatric: She has a normal mood and affect.  Nursing note and vitals reviewed.    ED Treatments / Results  DIAGNOSTIC STUDIES: Oxygen Saturation is 98% on RA, normal by my interpretation.    COORDINATION OF CARE: 12:22 PM- Pt advised of plan for treatment and pt agrees.  Labs (all labs ordered are listed, but only abnormal results are displayed) Labs Reviewed - No data to  display  EKG  EKG Interpretation None       Radiology Dg Hip Unilat W Or Wo Pelvis 2-3 Views Left  Result Date: 05/23/2017 CLINICAL DATA:  Moderate left hip pain for approximately 2 weeks. No known injury. EXAM: DG HIP (WITH OR WITHOUT PELVIS) 2-3V LEFT COMPARISON:  None. FINDINGS: No acute bony or joint abnormality is seen. No evidence of avascular necrosis of the femoral heads. Mild joint space narrowing of both hips is identified. Surrounding osseous and soft tissue structures are unremarkable. IMPRESSION: Mild bilateral hip degenerative change.  Otherwise negative. Electronically Signed   By: [name] M.D.   On: 05/23/2017 13:02    Procedures Procedures (including critical care time)  Medications Ordered in ED Medications - No data to display   Initial Impression / Assessment and  Plan / ED Course  I have reviewed the triage vital signs and the nursing notes.  Pertinent labs & imaging results that were available during my care of the patient were reviewed by me and considered in my medical decision making (see chart for details).     Patient's presentation is consistent with arthritis of her left hip.  She has no signs of infectious etiology, no significant back pain.  Patient has a history of the same.  She has follow-up evaluation with orthopedic specialist today.  She will be discharged home with follow-up information return precautions.  She verbalized understanding and agreement to today's plan had no further questions or concerns at the time of discharge.  Final Clinical Impressions(s) / ED Diagnoses   Final diagnoses:  Pain of left hip joint    New Prescriptions Discharge Medication List as of 05/23/2017  1:21 PM     I personally performed the services described in this documentation, which was scribed in my presence. The recorded information has been reviewed and is accurate.    Eyvonne Mechanic, PA-C 05/23/17 1359    Mancel Bale, MD 05/23/17  1626

## 2017-05-23 NOTE — Telephone Encounter (Signed)
Patient calling today, in severe pain in left hip. Complains of shooting pain, short of breath. Our first available today is 230pm with GC. She had seen Dr. Magnus IvanBlackman in 2016. Advised her due to severity of pain it maybe best for her to go to ER, for pain management control if nothing else. She is crying on the phone, unfortunately there is no pain medications that can be administered here. They would still have to evaluate. Advised that due to her symptoms and tone of voice this is probably her best option.

## 2017-05-23 NOTE — Patient Instructions (Signed)

## 2017-05-23 NOTE — Discharge Instructions (Signed)
Please read attached information. If you experience any new or worsening signs or symptoms please return to the emergency room for evaluation. Please follow-up with your primary care provider or specialist as discussed.  °

## 2017-05-23 NOTE — ED Notes (Signed)
On way to XR 

## 2017-05-23 NOTE — ED Triage Notes (Signed)
Pt reports left hip pain that radiates to her back, onset 2 weeks ago but pain was worse today when she woke up. Denies recent injury.

## 2017-06-01 IMAGING — CR DG KNEE COMPLETE 4+V*L*
4 series · 4 of 4 positions shown · non-contrast
Comparison: None.

CLINICAL DATA: Status post fall from standing, with left knee pain.
Initial encounter.

EXAM:
LEFT KNEE - COMPLETE 4+ VIEW

[t knee ap left]
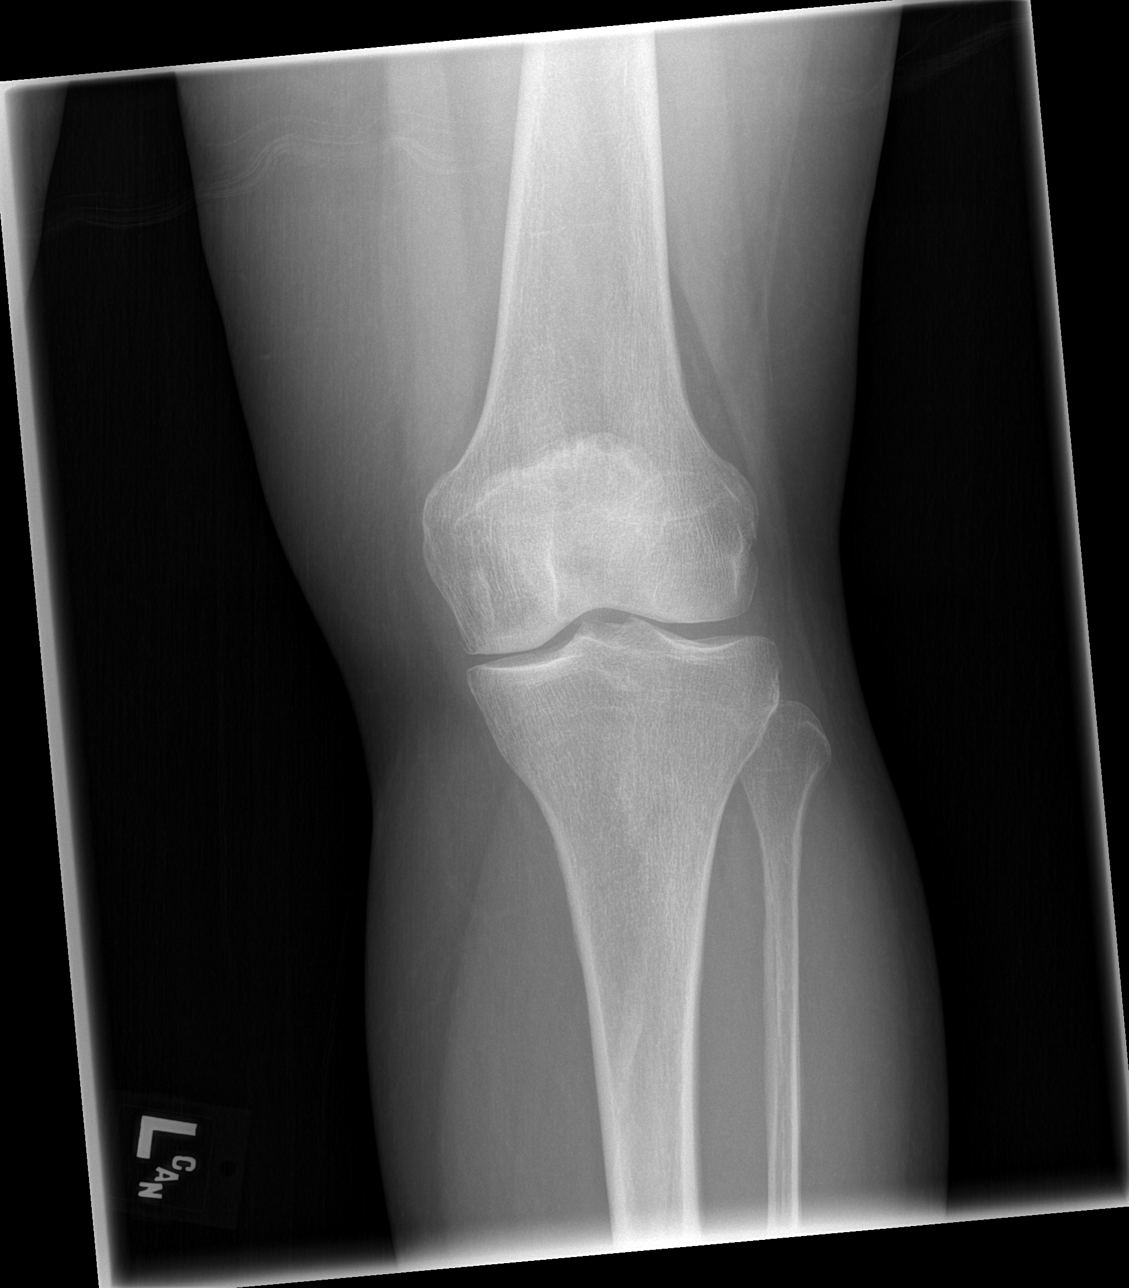

[t knee oblique left (1 of 2)]
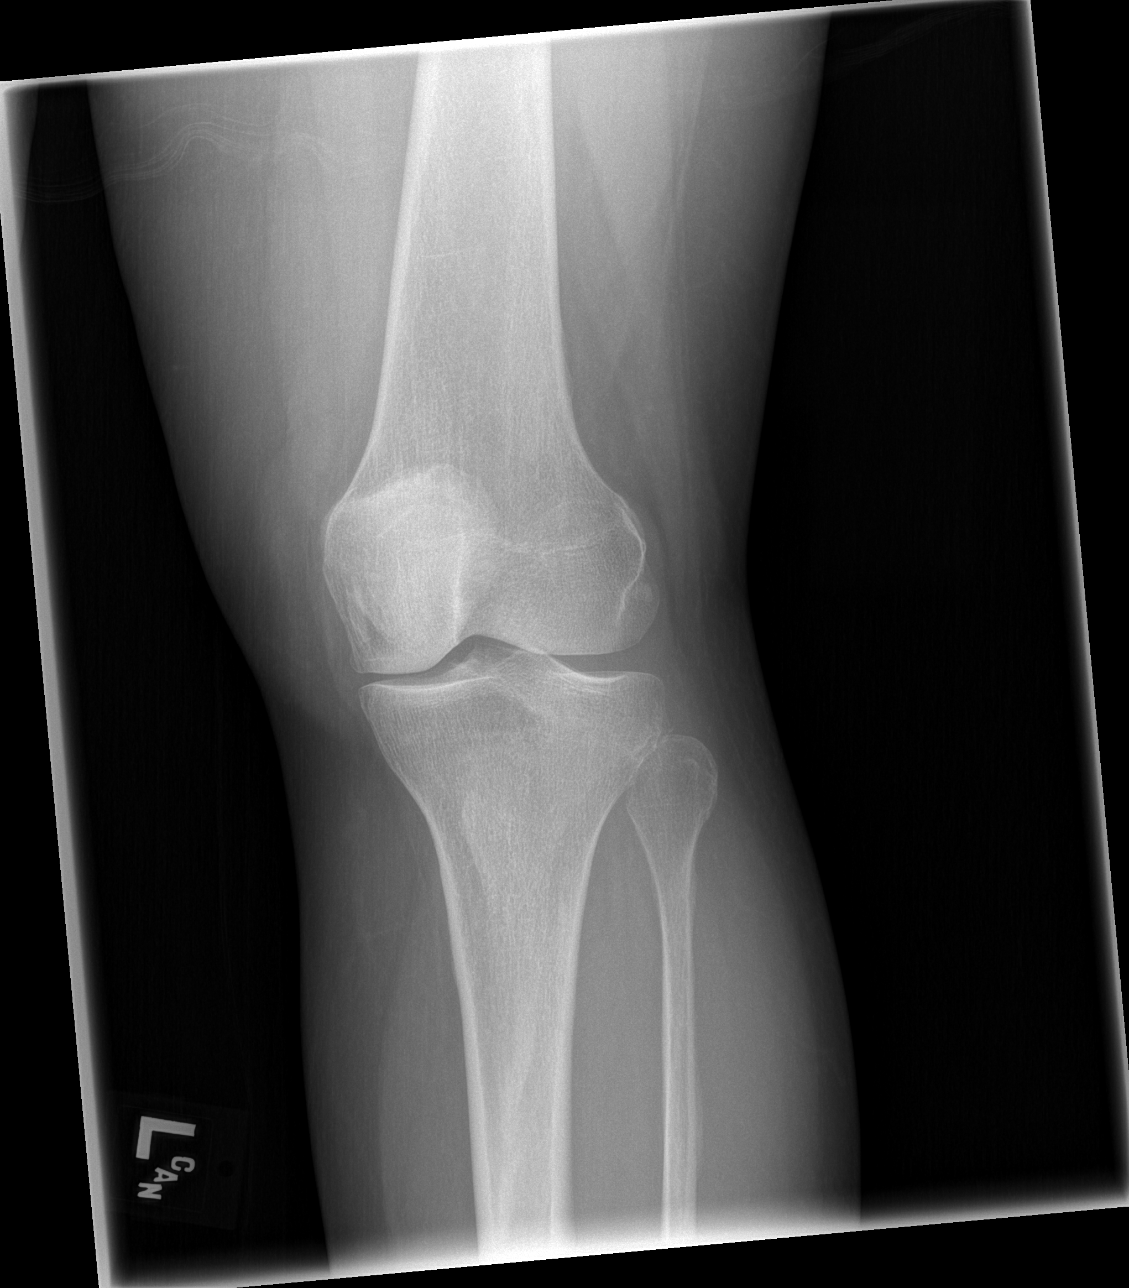

[t knee oblique left (2 of 2)]
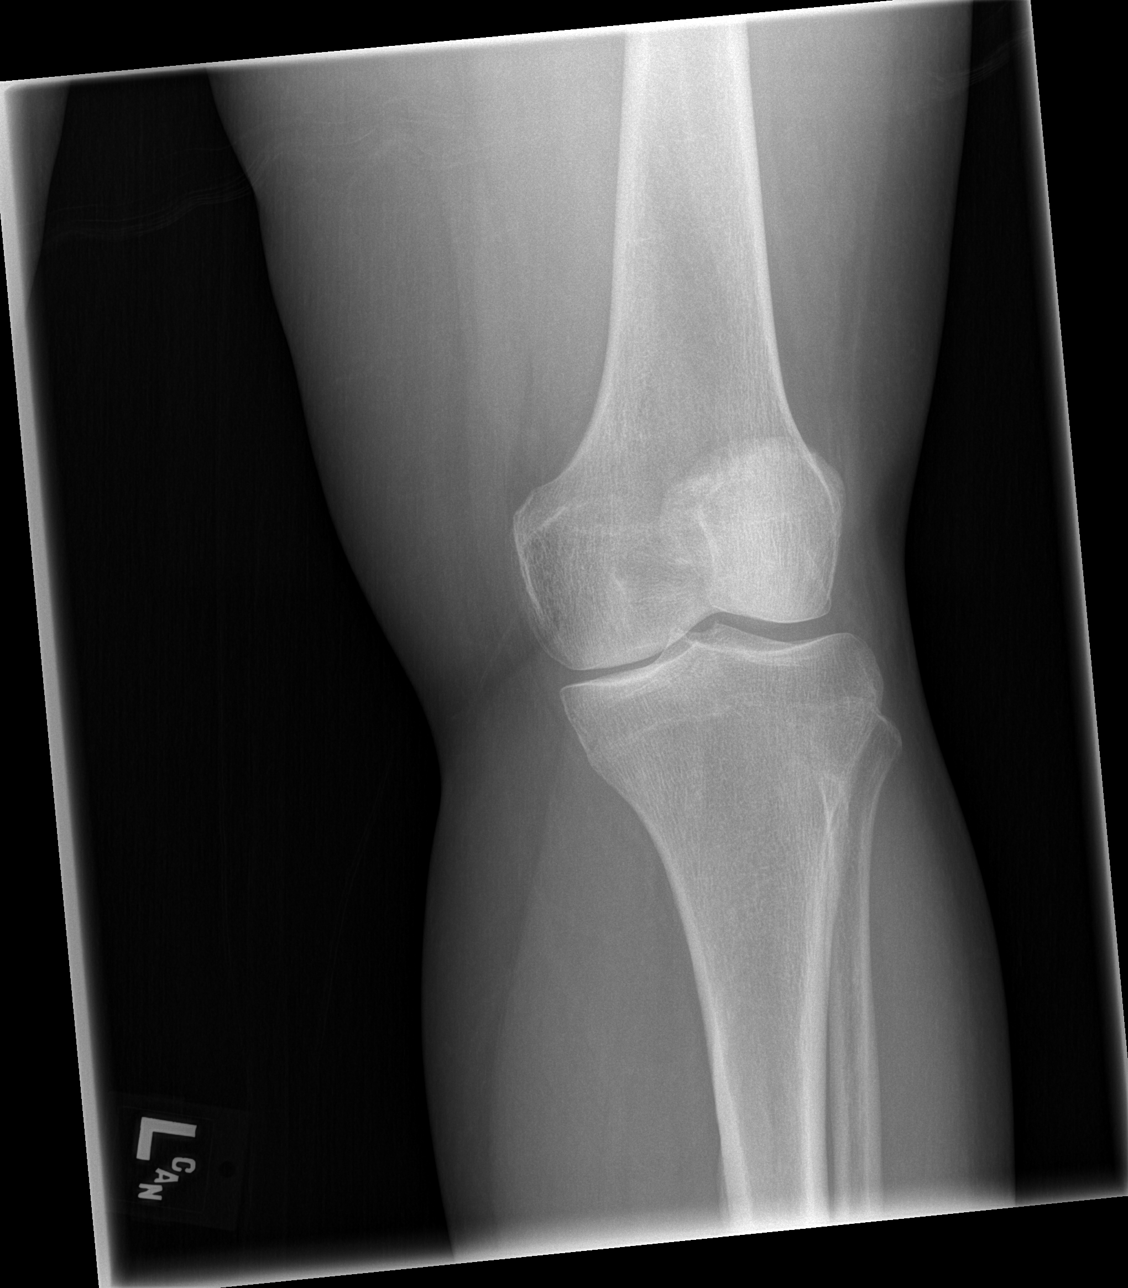

[t knee lat left]
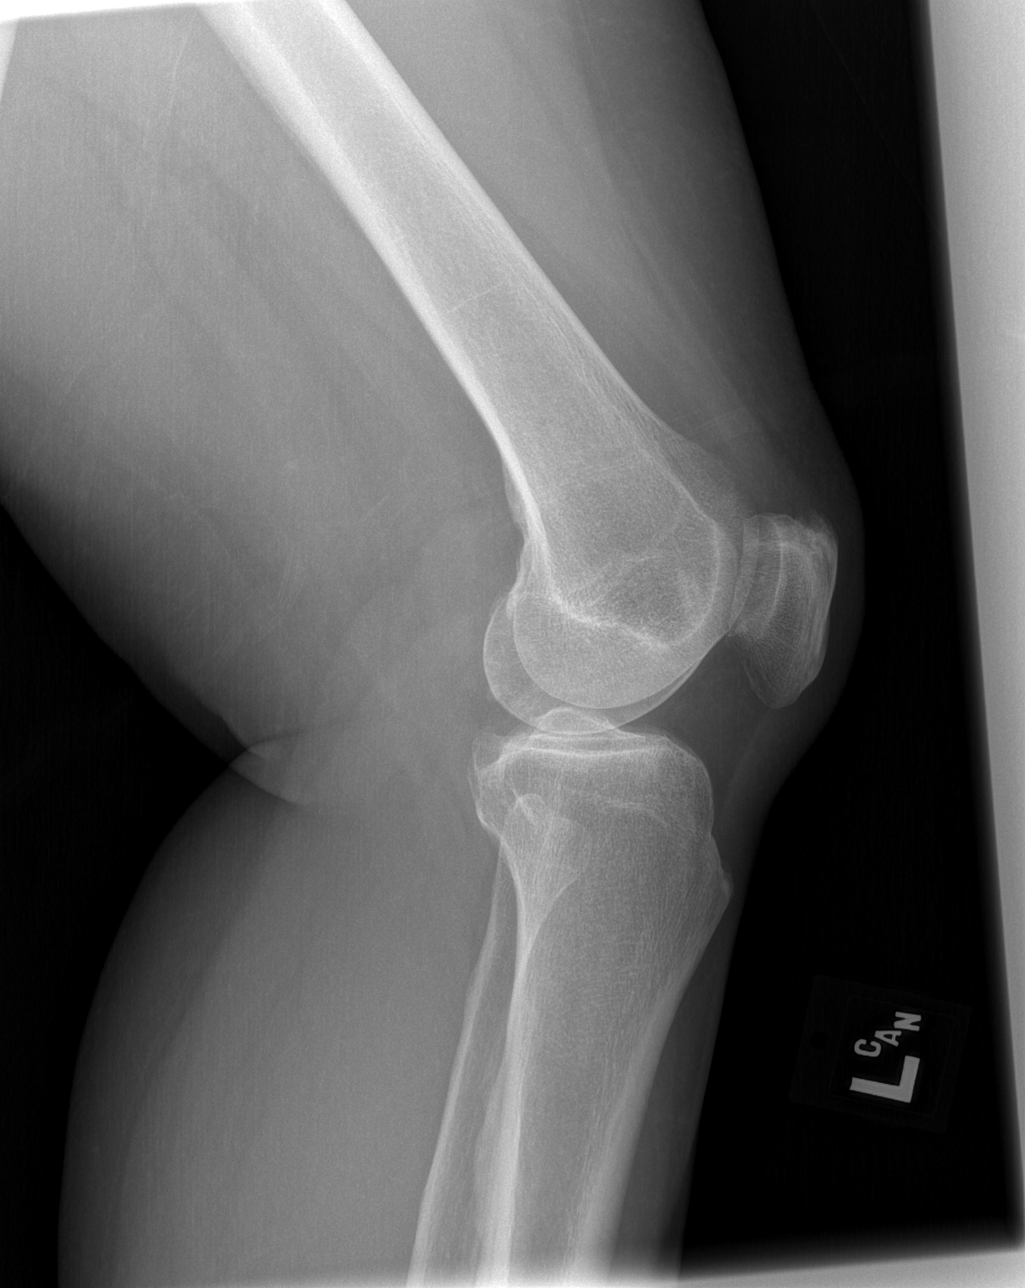

[4 of 4 positions shown; findings below may reference images not displayed]

FINDINGS: There is no evidence of fracture or dislocation. There is minimal
medial compartment narrowing; the patellofemoral joint is grossly
unremarkable in appearance. Mild degenerative change is noted at the
superior pole of the patella.

No significant joint effusion is seen. The visualized soft tissues
are normal in appearance.
IMPRESSION: No evidence of fracture or dislocation.

## 2017-06-01 IMAGING — CR DG ANKLE COMPLETE 3+V*R*
3 series · 3 of 3 positions shown · non-contrast
Comparison: None.

CLINICAL DATA: 59-year-old female with fall and right ankle pain
and swelling.

EXAM:
RIGHT ANKLE - COMPLETE 3+ VIEW

[t ankle joint ap right]
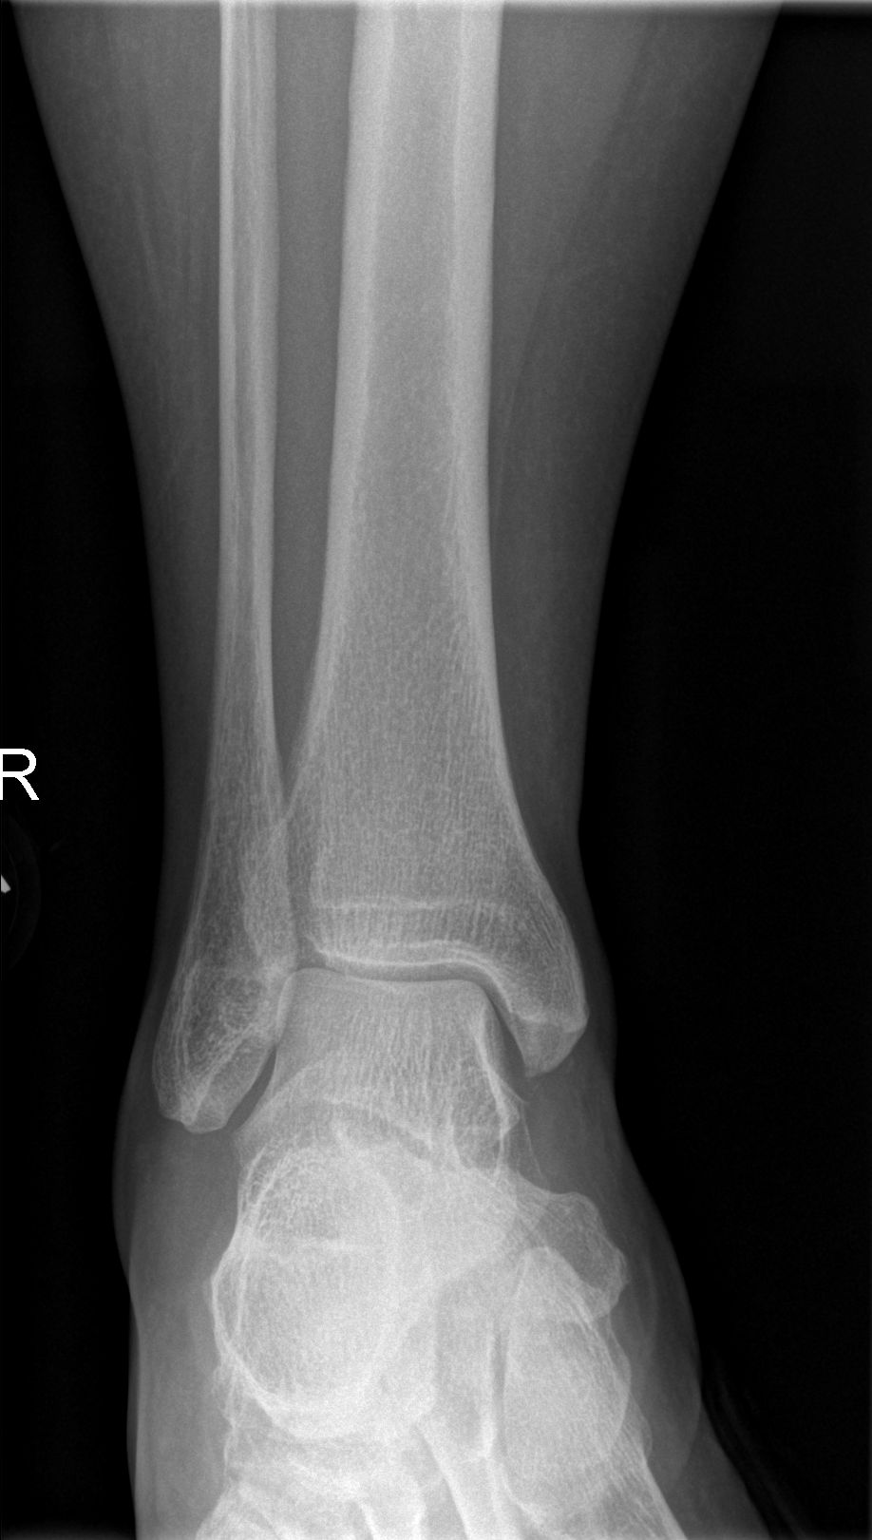

[t ankle joint oblique right]
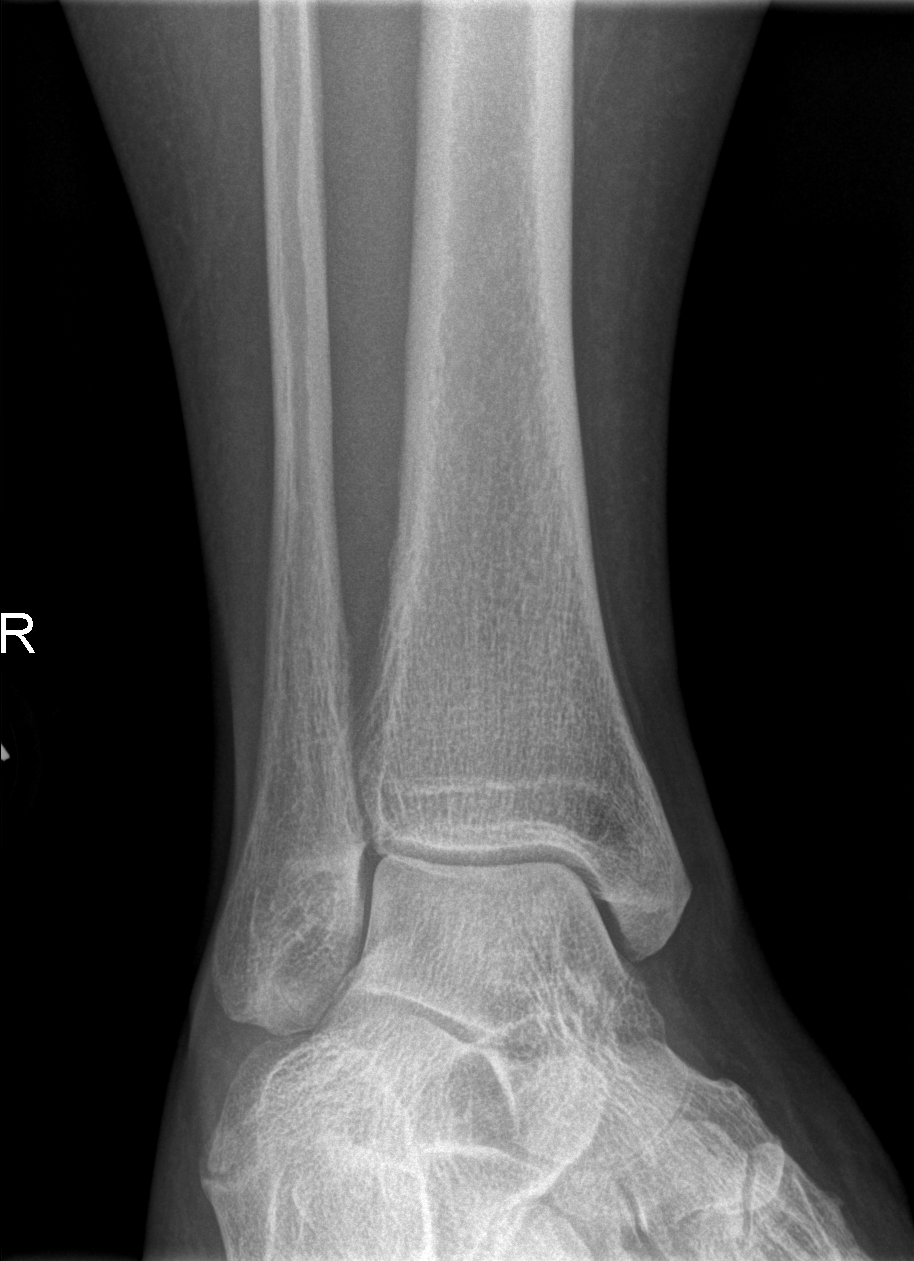

[t ankle joint lat right]
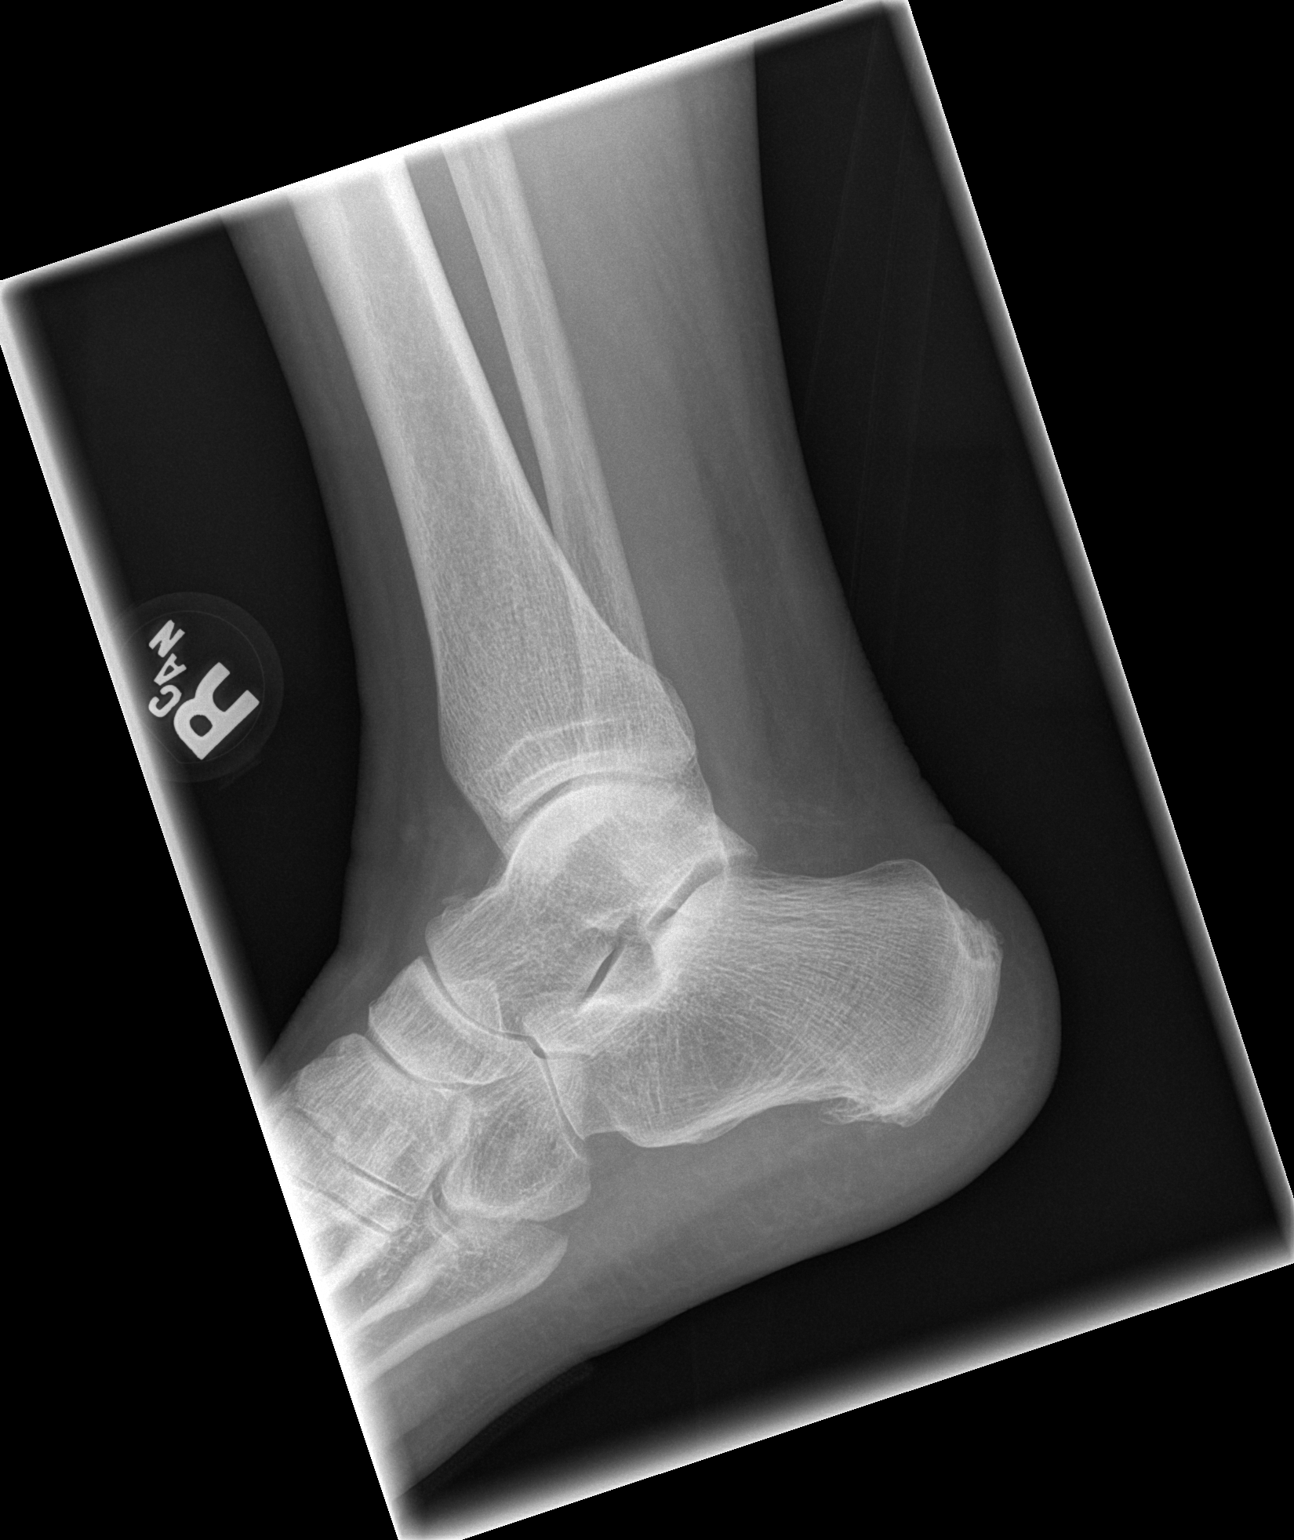

[3 of 3 positions shown; findings below may reference images not displayed]

FINDINGS: There is no evidence of fracture, dislocation, or joint effusion.
There is no evidence of arthropathy or other focal bone abnormality.
Soft tissues are unremarkable.
IMPRESSION: Negative.

## 2017-06-09 ENCOUNTER — Other Ambulatory Visit: Payer: Self-pay | Admitting: Endocrinology

## 2017-06-11 ENCOUNTER — Other Ambulatory Visit: Payer: Self-pay | Admitting: Endocrinology

## 2017-06-16 ENCOUNTER — Other Ambulatory Visit: Payer: Self-pay | Admitting: Endocrinology

## 2017-06-24 ENCOUNTER — Other Ambulatory Visit: Payer: Self-pay | Admitting: Endocrinology

## 2017-07-14 ENCOUNTER — Other Ambulatory Visit: Payer: Self-pay | Admitting: Endocrinology

## 2017-07-25 ENCOUNTER — Telehealth: Payer: Self-pay | Admitting: Endocrinology

## 2017-07-25 ENCOUNTER — Other Ambulatory Visit: Payer: Self-pay | Admitting: Endocrinology

## 2017-07-25 MED ORDER — CANAGLIFLOZIN 300 MG PO TABS: ORAL_TABLET | ORAL | 3 refills | Status: DC

## 2017-07-25 NOTE — Telephone Encounter (Signed)
MEDICATION: Lantus Solostar Pen  PHARM[facility]     IS THIS A 90 DAY SUPPLY : no  IS PATIENT OUT OF MEDICTAION: no  IF NOT; HOW MUCH IS LEFT: 1 injectable  LAST APPOINTMENT DATE: 06/07  NEXT APPOINTMENT DATE: 09/11  OTHER COMMENTS:    **Let patient know to contact pharmacy at the end of the day to make sure medication is ready. **  ** Please notify patient to allow 48-72 hours to process**  **Encourage patient to contact the pharmacy for refills or they can request refills through [facility]**  MEDICATION: Invokana  PHARMACY: [facility] [address]    IS THIS A 90 DAY SUPPLY : no  IS PATIENT OUT OF MEDICTAION: no  IF NOT; HOW MUCH IS LEFT: 1 week  LAST APPOINTMENT DATE: 06/07  NEXT APPOINTMENT DATE: 09/11  OTHER COMMENTS:    **Let patient know to contact pharmacy at the end of the day to make sure medication is ready. **  ** Please notify patient to allow 48-72 hours to process**  **Encourage patient to contact the pharmacy for refills or they can request refills through [facility]**

## 2017-07-25 NOTE — Telephone Encounter (Signed)
MEDICATION: canagliflozin (INVOKANA) 300 MG TABS tablet  PHARMACY:  [id number] - [place], [place] - [address] [phone] (Phone) [phone] (Fax)    IS THIS A 90 DAY SUPPLY : no  IS PATIENT OUT OF MEDICATION: yes  IF NOT; HOW MUCH IS LEFT: n/a  LAST APPOINTMENT DATE:05/10/17  NEXT APPOINTMENT DATE:08/14/17  OTHER COMMENTS: Medication was sent to the incorrect pharmacy, please send to [facility]. Call [facility] to cancel rx.    **Let patient know to contact pharmacy at the end of the day to make sure medication is ready. **  ** Please notify patient to allow 48-72 hours to process**  **Encourage patient to contact the pharmacy for refills or they can request refills through [facility]**

## 2017-07-25 NOTE — Telephone Encounter (Signed)
Called patient and let her know that I have sent in her prescriptions to her pharmacy.

## 2017-07-25 NOTE — Telephone Encounter (Signed)
I have sent in Lantus and Invokana to the pharmacy for patient and called and talked to her.

## 2017-08-10 ENCOUNTER — Other Ambulatory Visit (INDEPENDENT_AMBULATORY_CARE_PROVIDER_SITE_OTHER): Payer: BLUE CROSS/BLUE SHIELD

## 2017-08-10 DIAGNOSIS — E1165 Type 2 diabetes mellitus with hyperglycemia: Secondary | ICD-10-CM

## 2017-08-10 DIAGNOSIS — Z794 Long term (current) use of insulin: Secondary | ICD-10-CM

## 2017-08-10 LAB — BASIC METABOLIC PANEL
BUN: 14 mg/dL (ref 6–23)
CHLORIDE: 103 meq/L (ref 96–112)
CO2: 27 meq/L (ref 19–32)
Calcium: 9.6 mg/dL (ref 8.4–10.5)
Creatinine, Ser: 0.67 mg/dL (ref 0.40–1.20)
GFR: 95.16 mL/min (ref >60.00)
GLUCOSE: 156 mg/dL — AB (ref 70–99)
POTASSIUM: 4.2 meq/L (ref 3.5–5.1)
Sodium: 138 mEq/L (ref 135–145)

## 2017-08-10 LAB — LIPID PANEL
CHOL/HDL RATIO: 5
Cholesterol: 175 mg/dL (ref 0–200)
HDL: 38.3 mg/dL — AB (ref >39.00)
LDL CALC: 112 mg/dL — AB (ref 0–99)
NONHDL: 136.67
Triglycerides: 124 mg/dL (ref 0.0–149.0)
VLDL: 24.8 mg/dL (ref 0.0–40.0)

## 2017-08-10 LAB — TSH: TSH: 3.91 u[IU]/mL (ref 0.35–4.50)

## 2017-08-10 LAB — HEMOGLOBIN A1C: Hgb A1c MFr Bld: 7.3 % — ABNORMAL HIGH (ref 4.6–6.5)

## 2017-08-14 ENCOUNTER — Telehealth: Payer: Self-pay | Admitting: Endocrinology

## 2017-08-14 ENCOUNTER — Other Ambulatory Visit: Payer: Self-pay

## 2017-08-14 ENCOUNTER — Encounter: Payer: Self-pay | Admitting: Endocrinology

## 2017-08-14 ENCOUNTER — Ambulatory Visit (INDEPENDENT_AMBULATORY_CARE_PROVIDER_SITE_OTHER): Payer: BLUE CROSS/BLUE SHIELD | Admitting: Endocrinology

## 2017-08-14 VITALS — BP 132/78 | HR 85 | Ht 62.0 in | Wt 178.4 lb

## 2017-08-14 DIAGNOSIS — Z794 Long term (current) use of insulin: Secondary | ICD-10-CM

## 2017-08-14 DIAGNOSIS — E1165 Type 2 diabetes mellitus with hyperglycemia: Secondary | ICD-10-CM

## 2017-08-14 DIAGNOSIS — E782 Mixed hyperlipidemia: Secondary | ICD-10-CM | POA: Diagnosis not present

## 2017-08-14 MED ORDER — FREESTYLE LIBRE SENSOR SYSTEM MISC: 3 refills | Status: DC

## 2017-08-14 MED ORDER — SEMAGLUTIDE (1 MG/DOSE) 2 MG/1.5ML ~~LOC~~ SOPN: 1.0000 mg | PEN_INJECTOR | SUBCUTANEOUS | 3 refills | Status: DC

## 2017-08-14 NOTE — Progress Notes (Signed)
Patient ID: Natasha Chandler, female   DOB: 05-28-1956, 61 y.o.   MRN: [id number]           Reason for Appointment: F/u for Type 2 Diabetes  Referring physician: Cam Hai  History of Present Illness:          Date of diagnosis of type 2 diabetes mellitus: 2004       Background history:   She was treated with oral hypoglycemic drugs like metformin and first few years Insulin was started around 2008-9 but details are not available. She thinks she has taken Lantus for several years, at some point was also taking Humalog. Trulicity was started 1-2 years ago, taking Invokana 3 yrs  Over the last 2-3 years she has been having poor control and has had difficulty focusing on her diabetes because of family issues and periodically not having insurance  Recent history:   INSULIN regimen is: Lantus 62 in am    Non-insulin hypoglycemic drugs the patient is taking are: Metformin ER 1g bid, Invokana 300 mg daily, Ozempic 0.5 mg weekly   A1c is still about the same at 7.4, previously as high as 9.8  Current blood sugar patterns and problems identified:  She has continued using the freestyle Libre sensor  Again she does not always find this completely accurate but also has not had enough recording available because of her sensor falling off periodically, she has used the sensor about 55% in the last 2 weeks  He has increased her LANTUS on her own since she was seeing higher readings in the morning  However she thinks that the sensor is not always accurate and she has had occasional problems with the sensor  She is tolerating OZEMPIC although still concerned about the cost  Previously had seen improvement in her blood sugars with the 0.5 mg dose and has no nausea with this  She says she is not able to exercise much because of hot and humid weather  Diet: More recently she thinks she is being not is compliant with eating out in family celebrations   Side effects from medications have  been:none  Compliance with the medical regimen: Mostly good Hypoglycemia: as above  Glucose monitoring:  done 1-2 times a day         Glucometer: One Touch and freestyle Libre       Blood Glucose averages by download as below:  Mean values apply above for all meters except median for One Touch  PRE-MEAL Fasting Lunch Dinner Bedtime Overall  Glucose range:       Mean/median: 129  189  181  162  154   POST-MEAL PC Breakfast PC Lunch PC Dinner  Glucose range:     Mean/median:  166  188      Self-care: The diet that the patient has been following is: tries to limit fats.     Meal times are: Dinner: 7 pm   Typical meal intake: Breakfast is cereal, sometimes higher fat meals at lunch or dinner           Dietician visit, most recent: 4 years ago               Exercise:  not walking much   Weight history: 162-187  Wt Readings from Last 3 Encounters:  08/14/17 178 lb 6.4 oz (80.9 kg)  05/23/17 179 lb (81.2 kg)  05/10/17 181 lb 6.4 oz (82.3 kg)    Glycemic control:   Lab Results  Component Value  Date   HGBA1C 7.3 (H) 08/10/2017   HGBA1C 7.4 (H) 05/07/2017   HGBA1C 7.4 (H) 02/21/2017   Lab Results  Component Value Date   MICROALBUR 1.0 05/07/2017   LDLCALC 112 (H) 08/10/2017   CREATININE 0.67 08/10/2017   Lab on 08/10/2017  Component Date Value Ref Range Status  . Hgb A1c MFr Bld 08/10/2017 7.3* 4.6 - 6.5 % Final   Glycemic Control Guidelines for People with Diabetes:Non Diabetic:  <6%Goal of Therapy: <7%Additional Action Suggested:  >8%   . Sodium 08/10/2017 138  135 - 145 mEq/L Final  . Potassium 08/10/2017 4.2  3.5 - 5.1 mEq/L Final  . Chloride 08/10/2017 103  96 - 112 mEq/L Final  . CO2 08/10/2017 27  19 - 32 mEq/L Final  . Glucose, Bld 08/10/2017 156* 70 - 99 mg/dL Final  . BUN 16/09/9603 14  6 - 23 mg/dL Final  . Creatinine, Ser 08/10/2017 0.67  0.40 - 1.20 mg/dL Final  . Calcium 54/08/8118 9.6  8.4 - 10.5 mg/dL Final  . GFR 14/78/2956 95.16  >60.00 mL/min  Final  . TSH 08/10/2017 3.91  0.35 - 4.50 uIU/mL Final  . Cholesterol 08/10/2017 175  0 - 200 mg/dL Final   ATP III Classification       Desirable:  < 200 mg/dL               Borderline High:  200 - 239 mg/dL          High:  > = 213 mg/dL  . Triglycerides 08/10/2017 124.0  0.0 - 149.0 mg/dL Final   Normal:  <086 mg/dLBorderline High:  150 - 199 mg/dL  . HDL 08/10/2017 38.30* >39.00 mg/dL Final  . VLDL 57/84/6962 24.8  0.0 - 40.0 mg/dL Final  . LDL Cholesterol 08/10/2017 112* 0 - 99 mg/dL Final  . Total CHOL/HDL Ratio 08/10/2017 5   Final                  Men          Women1/2 Average Risk     3.4          3.3Average Risk          5.0          4.42X Average Risk          9.6          7.13X Average Risk          15.0          11.0                      . NonHDL 08/10/2017 136.67   Final   NOTE:  Non-HDL goal should be 30 mg/dL higher than patient's LDL goal (i.e. LDL goal of < 70 mg/dL, would have non-HDL goal of < 100 mg/dL)    Other active problems: See review of systems   Allergies as of 08/14/2017      Reactions   Penicillins Anaphylaxis, Rash   Has patient had a PCN reaction causing immediate rash, facial/tongue/throat swelling, SOB or lightheadedness with hypotension: Yes Has patient had a PCN reaction causing severe rash involving mucus membranes or skin necrosis: No Has patient had a PCN reaction that required hospitalization Yes Has patient had a PCN reaction occurring within the last 10 years: No If all of the above answers are "NO", then may proceed with Cephalosporin use. Has patient had a PCN reaction causing immediate rash,  facial/tongue/throat swelling, SOB or lightheadedness with hypotension: Yes Has patient had a PCN reaction causing severe rash involving mucus membranes or skin necrosis: No Has patient had a PCN reaction that required hospitalization Yes Has patient had a PCN reaction occurring within the last 10 years: No If all of the above answers are "NO", then may  proceed with Cephalosporin use.   Sulfa Antibiotics Diarrhea   Sulfasalazine Diarrhea      Medication List       Accurate as of 08/14/17  9:29 AM. Always use your most recent med list.          albuterol 108 (90 Base) MCG/ACT inhaler Commonly known as:  PROVENTIL HFA;VENTOLIN HFA Inhale into the lungs.   amLODipine 10 MG tablet Commonly known as:  NORVASC Take 10 mg by mouth daily.   canagliflozin 300 MG Tabs tablet Commonly known as:  INVOKANA TAKE 1 TABLET (300 MG TOTAL) BY MOUTH DAILY BEFORE BREAKFAST.   desvenlafaxine 50 MG 24 hr tablet Commonly known as:  PRISTIQ   FREESTYLE CONTROL SOLUTION Liqd Use to calibrate meter   FREESTYLE LIBRE SENSOR SYSTEM Misc CHANGE EVERY 10 DAYS AS DIRECTED   glucose blood test strip Commonly known as:  FREESTYLE PRECISION NEO TEST 1 each by Other route as needed for other. Use to test blood sugar once daily   ibuprofen 600 MG tablet Commonly known as:  ADVIL,MOTRIN Take 1 tablet (600 mg total) by mouth every 6 (six) hours as needed.   Insulin Glargine 100 UNIT/ML Solostar Pen Commonly known as:  LANTUS SOLOSTAR INJECT 68 UNITS INTO THE SKIN EVERY MORNING.   Insulin Pen Needle 31G X 5 MM Misc Use to inject insulin once daily   levothyroxine 25 MCG tablet Commonly known as:  SYNTHROID, LEVOTHROID TAKE ONE TABLET BY MOUTH ONE TIME DAILY BEFORE BREAKFAST   losartan 100 MG tablet Commonly known as:  COZAAR Take 100 mg by mouth daily.   metFORMIN 500 MG 24 hr tablet Commonly known as:  GLUCOPHAGE-XR TAKE 2 TABLETS BY MOUTH TWICE A DAY   OSPHENA 60 MG Tabs Generic drug:  Ospemifene Take 60 mg by mouth daily.   PREMARIN vaginal cream Generic drug:  conjugated estrogens   Semaglutide 1 MG/DOSE Sopn Commonly known as:  OZEMPIC Inject 1 mg into the skin once a week.   simvastatin 20 MG tablet Commonly known as:  ZOCOR Take 20 mg by mouth daily.       Allergies:  Allergies  Allergen Reactions  . Penicillins  Anaphylaxis and Rash    Has patient had a PCN reaction causing immediate rash, facial/tongue/throat swelling, SOB or lightheadedness with hypotension: Yes Has patient had a PCN reaction causing severe rash involving mucus membranes or skin necrosis: No Has patient had a PCN reaction that required hospitalization Yes Has patient had a PCN reaction occurring within the last 10 years: No If all of the above answers are "NO", then may proceed with Cephalosporin use. Has patient had a PCN reaction causing immediate rash, facial/tongue/throat swelling, SOB or lightheadedness with hypotension: Yes Has patient had a PCN reaction causing severe rash involving mucus membranes or skin necrosis: No Has patient had a PCN reaction that required hospitalization Yes Has patient had a PCN reaction occurring within the last 10 years: No If all of the above answers are "NO", then may proceed with Cephalosporin use.   . Sulfa Antibiotics Diarrhea  . Sulfasalazine Diarrhea    Past Medical History:  Diagnosis Date  . Asthma   .  Diabetes mellitus   . Hyperlipidemia   . Hypertension   . Thyroid disease    Small goiter, stable  . Vitamin D deficiency     Past Surgical History:  Procedure Laterality Date  . TONSILLECTOMY      Family History  Problem Relation Age of Onset  . Diabetes Mother   . Diabetes Father   . Diabetes Maternal Aunt     Social History:  reports that she has never smoked. She has never used smokeless tobacco. She reports that she does not drink alcohol. Her drug history is not on file.    Review of Systems    Lipid history: Has low HDL and borderline LDL, on simvastatin 20 mg from PCP    Lab Results  Component Value Date   CHOL 175 08/10/2017   HDL 38.30 (L) 08/10/2017   LDLCALC 112 (H) 08/10/2017   TRIG 124.0 08/10/2017   CHOLHDL 5 08/10/2017           Hypertension: Treated with amlodipine and losartan100 mg  Most recent eye exam was 7/16  Most recent foot exam:  12/16  Thyroid: She has had a Nodular goiter since at least 2013 Thyroid biopsy was negative in 6/17 for a 1.9 cm isthmus  Nodule  HYPOTHYROIDISM: On her last visit in September2017 she was complaining about fatigue and some cold intolerance and her TSH was high at 5.5 She is since then empirically trying 25 g of levothyroxine and this had improved energy level  TSH is More recently normal   Lab Results  Component Value Date   TSH 3.91 08/10/2017   TSH 3.50 01/18/2017   TSH 1.99 09/26/2016   FREET4 0.83 01/18/2017   FREET4 0.80 09/26/2016     She is vitamin D deficient, advised 5000 units vitamin D 3 from PCP   Review of Systems   LABS:  Lab on 08/10/2017  Component Date Value Ref Range Status  . Hgb A1c MFr Bld 08/10/2017 7.3* 4.6 - 6.5 % Final   Glycemic Control Guidelines for People with Diabetes:Non Diabetic:  <6%Goal of Therapy: <7%Additional Action Suggested:  >8%   . Sodium 08/10/2017 138  135 - 145 mEq/L Final  . Potassium 08/10/2017 4.2  3.5 - 5.1 mEq/L Final  . Chloride 08/10/2017 103  96 - 112 mEq/L Final  . CO2 08/10/2017 27  19 - 32 mEq/L Final  . Glucose, Bld 08/10/2017 156* 70 - 99 mg/dL Final  . BUN 16/10/960409/06/2017 14  6 - 23 mg/dL Final  . Creatinine, Ser 08/10/2017 0.67  0.40 - 1.20 mg/dL Final  . Calcium 54/09/811909/06/2017 9.6  8.4 - 10.5 mg/dL Final  . GFR 14/78/295609/06/2017 95.16  >60.00 mL/min Final  . TSH 08/10/2017 3.91  0.35 - 4.50 uIU/mL Final  . Cholesterol 08/10/2017 175  0 - 200 mg/dL Final   ATP III Classification       Desirable:  < 200 mg/dL               Borderline High:  200 - 239 mg/dL          High:  > = 213240 mg/dL  . Triglycerides 08/10/2017 124.0  0.0 - 149.0 mg/dL Final   Normal:  <086<150 mg/dLBorderline High:  150 - 199 mg/dL  . HDL 08/10/2017 38.30* >39.00 mg/dL Final  . VLDL 57/84/696209/06/2017 24.8  0.0 - 40.0 mg/dL Final  . LDL Cholesterol 08/10/2017 112* 0 - 99 mg/dL Final  . Total CHOL/HDL Ratio 08/10/2017 5   Final  Men          Women1/2  Average Risk     3.4          3.3Average Risk          5.0          4.42X Average Risk          9.6          7.13X Average Risk          15.0          11.0                      . NonHDL 08/10/2017 136.67   Final   NOTE:  Non-HDL goal should be 30 mg/dL higher than patient's LDL goal (i.e. LDL goal of < 70 mg/dL, would have non-HDL goal of < 100 mg/dL)    Physical Examination:  BP 132/78   Pulse 85   Ht  (1.575 m)   Wt 178 lb 6.4 oz (80.9 kg)   SpO2 97%   BMI 32.63 kg/m    ASSESSMENT:  Diabetes type 2, uncontrolled with BMI 33 See history of present illness for detailed discussion of current diabetes management, blood sugar patterns and problems identified  She is currently on basal insulin, Ozempic along with Invokana and metformin A1c is now finally below 8% Not clear this is related to switching to Ozempic as yet However she is mostly having higher postprandial readings when he she goes off her diet or has more carbohydrate  With using the freestyle Libre sensor she is not able to see more clearly when her blood sugars are going up with various foods, highest readings aren't 300 with eating hot dogs She is trying to exercise with walking more and has lost 2 pounds, previously had desired a weight-loss drug  HYPERTENSION: Well controlled  LIPIDS: Will forward results to her PCP, recommend increasing Zocor to 40 mg or switching especially since she is on amlodipine also  PLAN:    She can watch her fasting readings over the next few days and if starting to get low normal she can reduce her Lantus  More consistent diet and avoid high carbohydrate or high-fat meals  She will continue using the freestyle Libre sensor, co-pay card given  Advised  to avoid taking extra Lantus as this is a slow insulin  She'll continue 0.5 mg Ozempic weekly but consider increasing to 1 mg on the next visit, explained to her that she gets 2 mg in one pen  Continue walking  regularly   Patient Instructions  Take 1 Metformin at Bfst and lunch and 2 at dinner  Exercise at home with music videos  Ozempic  weekly  Counseling time on subjects discussed above is over 50% of today's 25 minute visit   Darrius Montano 08/14/2017, 9:29 AM   Note: This office note was prepared with Dragon voice recognition system technology. Any transcriptional errors that result from this process are unintentional.  Patient ID: Natasha Chandler, female   DOB: 29-Jul-1956, 61 y.o.   MRN: [id number]           Reason for Appointment: F/u for Type 2 Diabetes  Referring physician: Cam Hai  History of Present Illness:          Date of diagnosis of type 2 diabetes mellitus: 2004       Background history:   She was treated with oral hypoglycemic drugs like metformin and  first few years Insulin was started around 2008-9 but details are not available. She thinks she has taken Lantus for several years, at some point was also taking Humalog. Trulicity was started 1-2 years ago, taking Invokana 3 yrs  Over the last 2-3 years she has been having poor control and has had difficulty focusing on her diabetes because of family issues and periodically not having insurance  Recent history:   INSULIN regimen is: Lantus 68 in am    Non-insulin hypoglycemic drugs the patient is taking are: Metformin ER 1g bid, Invokana 300 mg daily, Victoza 1.8mg    A1c is now relatively better at 7.4, previously as high as 9.8  Current blood sugar patterns and problems identified:  She has only in the last 3-4 days started using the freestyle Libre sensor in addition to her One Touch meter  Again usually on her monitor checking readings fasting and not after meals as much  She has started using the Ozempic about a month ago and did take at least 2 injections of the 0.5 dosage, she is tolerating this well and was able to get this with her co-pay card although with some difficulty  She has not had much  change in her blood sugars on her monitor as yet although does not have any consistently high post pen  If she did have a significantly high reading of over 300 yesterday afternoon after eating 2 hot dogs  However she took 10 units Lantus extra yesterday and got hypoglycemic last night but blood sugar down to 69 and she treated it with a lot of food  Her overnight blood sugars are otherwise on her continuous monitor around 140  Fasting blood sugars on her fingersticks are variable ranging from 77-165  Postprandial readings otherwise have been occasionally around 180-200   Side effects from medications have been:none  Compliance with the medical regimen: Mostly good Hypoglycemia: Only once as above  Glucose monitoring:  done 1-2 times a day         Glucometer: One Touch and freestyle Libre       Blood Glucose readings by download as above:  Median blood sugar on her fingersticks overall 112  Self-care: The diet that the patient has been following is: tries to limit fats.     Meal times are: Dinner: 7 pm   Typical meal intake: Breakfast is cereal or eggs, sometimes higher fat meals           Dietician visit, most recent: 4 years ago               Exercise: walking more recently  Weight history: 162-187  Wt Readings from Last 3 Encounters:  08/14/17 178 lb 6.4 oz (80.9 kg)  05/23/17 179 lb (81.2 kg)  05/10/17 181 lb 6.4 oz (82.3 kg)    Glycemic control:   Lab Results  Component Value Date   HGBA1C 7.3 (H) 08/10/2017   HGBA1C 7.4 (H) 05/07/2017   HGBA1C 7.4 (H) 02/21/2017   Lab Results  Component Value Date   MICROALBUR 1.0 05/07/2017   LDLCALC 112 (H) 08/10/2017   CREATININE 0.67 08/10/2017   Lab on 08/10/2017  Component Date Value Ref Range Status  . Hgb A1c MFr Bld 08/10/2017 7.3* 4.6 - 6.5 % Final   Glycemic Control Guidelines for People with Diabetes:Non Diabetic:  <6%Goal of Therapy: <7%Additional Action Suggested:  >8%   . Sodium 08/10/2017 138  135 - 145  mEq/L Final  . Potassium 08/10/2017 4.2  3.5 - 5.1 mEq/L Final  . Chloride 08/10/2017 103  96 - 112 mEq/L Final  . CO2 08/10/2017 27  19 - 32 mEq/L Final  . Glucose, Bld 08/10/2017 156* 70 - 99 mg/dL Final  . BUN 16/09/9603 14  6 - 23 mg/dL Final  . Creatinine, Ser 08/10/2017 0.67  0.40 - 1.20 mg/dL Final  . Calcium 54/08/8118 9.6  8.4 - 10.5 mg/dL Final  . GFR 14/78/2956 95.16  >60.00 mL/min Final  . TSH 08/10/2017 3.91  0.35 - 4.50 uIU/mL Final  . Cholesterol 08/10/2017 175  0 - 200 mg/dL Final   ATP III Classification       Desirable:  < 200 mg/dL               Borderline High:  200 - 239 mg/dL          High:  > = 213 mg/dL  . Triglycerides 08/10/2017 124.0  0.0 - 149.0 mg/dL Final   Normal:  <086 mg/dLBorderline High:  150 - 199 mg/dL  . HDL 08/10/2017 38.30* >39.00 mg/dL Final  . VLDL 57/84/6962 24.8  0.0 - 40.0 mg/dL Final  . LDL Cholesterol 08/10/2017 112* 0 - 99 mg/dL Final  . Total CHOL/HDL Ratio 08/10/2017 5   Final                  Men          Women1/2 Average Risk     3.4          3.3Average Risk          5.0          4.42X Average Risk          9.6          7.13X Average Risk          15.0          11.0                      . NonHDL 08/10/2017 136.67   Final   NOTE:  Non-HDL goal should be 30 mg/dL higher than patient's LDL goal (i.e. LDL goal of < 70 mg/dL, would have non-HDL goal of < 100 mg/dL)    Other active problems: See review of systems   Allergies as of 08/14/2017      Reactions   Penicillins Anaphylaxis, Rash   Has patient had a PCN reaction causing immediate rash, facial/tongue/throat swelling, SOB or lightheadedness with hypotension: Yes Has patient had a PCN reaction causing severe rash involving mucus membranes or skin necrosis: No Has patient had a PCN reaction that required hospitalization Yes Has patient had a PCN reaction occurring within the last 10 years: No If all of the above answers are "NO", then may proceed with Cephalosporin use. Has patient  had a PCN reaction causing immediate rash, facial/tongue/throat swelling, SOB or lightheadedness with hypotension: Yes Has patient had a PCN reaction causing severe rash involving mucus membranes or skin necrosis: No Has patient had a PCN reaction that required hospitalization Yes Has patient had a PCN reaction occurring within the last 10 years: No If all of the above answers are "NO", then may proceed with Cephalosporin use.   Sulfa Antibiotics Diarrhea   Sulfasalazine Diarrhea      Medication List       Accurate as of 08/14/17  9:29 AM. Always use your most recent med list.  albuterol 108 (90 Base) MCG/ACT inhaler Commonly known as:  PROVENTIL HFA;VENTOLIN HFA Inhale into the lungs.   amLODipine 10 MG tablet Commonly known as:  NORVASC Take 10 mg by mouth daily.   canagliflozin 300 MG Tabs tablet Commonly known as:  INVOKANA TAKE 1 TABLET (300 MG TOTAL) BY MOUTH DAILY BEFORE BREAKFAST.   desvenlafaxine 50 MG 24 hr tablet Commonly known as:  PRISTIQ   FREESTYLE CONTROL SOLUTION Liqd Use to calibrate meter   FREESTYLE LIBRE SENSOR SYSTEM Misc CHANGE EVERY 10 DAYS AS DIRECTED   glucose blood test strip Commonly known as:  FREESTYLE PRECISION NEO TEST 1 each by Other route as needed for other. Use to test blood sugar once daily   ibuprofen 600 MG tablet Commonly known as:  ADVIL,MOTRIN Take 1 tablet (600 mg total) by mouth every 6 (six) hours as needed.   Insulin Glargine 100 UNIT/ML Solostar Pen Commonly known as:  LANTUS SOLOSTAR INJECT 68 UNITS INTO THE SKIN EVERY MORNING.   Insulin Pen Needle 31G X 5 MM Misc Use to inject insulin once daily   levothyroxine 25 MCG tablet Commonly known as:  SYNTHROID, LEVOTHROID TAKE ONE TABLET BY MOUTH ONE TIME DAILY BEFORE BREAKFAST   losartan 100 MG tablet Commonly known as:  COZAAR Take 100 mg by mouth daily.   metFORMIN 500 MG 24 hr tablet Commonly known as:  GLUCOPHAGE-XR TAKE 2 TABLETS BY MOUTH TWICE A  DAY   OSPHENA 60 MG Tabs Generic drug:  Ospemifene Take 60 mg by mouth daily.   PREMARIN vaginal cream Generic drug:  conjugated estrogens   Semaglutide 1 MG/DOSE Sopn Commonly known as:  OZEMPIC Inject 1 mg into the skin once a week.   simvastatin 20 MG tablet Commonly known as:  ZOCOR Take 20 mg by mouth daily.       Allergies:  Allergies  Allergen Reactions  . Penicillins Anaphylaxis and Rash    Has patient had a PCN reaction causing immediate rash, facial/tongue/throat swelling, SOB or lightheadedness with hypotension: Yes Has patient had a PCN reaction causing severe rash involving mucus membranes or skin necrosis: No Has patient had a PCN reaction that required hospitalization Yes Has patient had a PCN reaction occurring within the last 10 years: No If all of the above answers are "NO", then may proceed with Cephalosporin use. Has patient had a PCN reaction causing immediate rash, facial/tongue/throat swelling, SOB or lightheadedness with hypotension: Yes Has patient had a PCN reaction causing severe rash involving mucus membranes or skin necrosis: No Has patient had a PCN reaction that required hospitalization Yes Has patient had a PCN reaction occurring within the last 10 years: No If all of the above answers are "NO", then may proceed with Cephalosporin use.   . Sulfa Antibiotics Diarrhea  . Sulfasalazine Diarrhea    Past Medical History:  Diagnosis Date  . Asthma   . Diabetes mellitus   . Hyperlipidemia   . Hypertension   . Thyroid disease    Small goiter, stable  . Vitamin D deficiency     Past Surgical History:  Procedure Laterality Date  . TONSILLECTOMY      Family History  Problem Relation Age of Onset  . Diabetes Mother   . Diabetes Father   . Diabetes Maternal Aunt     Social History:  reports that she has never smoked. She has never used smokeless tobacco. She reports that she does not drink alcohol. Her drug history is not on file.  Review of Systems    Lipid history: Has low HDL and borderline LDL, on simvastatin 20 mg from PCP, this has not been increased as recommended    Lab Results  Component Value Date   CHOL 175 08/10/2017   HDL 38.30 (L) 08/10/2017   LDLCALC 112 (H) 08/10/2017   TRIG 124.0 08/10/2017   CHOLHDL 5 08/10/2017           Hypertension: Treated with amlodipine and losartan 100 mg,  Most recent eye exam was 7/16  Most recent foot exam: 12/16  Thyroid: She has had a Nodular goiter since at least 2013 Thyroid biopsy was negative in 6/17 for a 1.9 cm isthmus  Nodule  HYPOTHYROIDISM: On her visit in September 2017 she was complaining about fatigue and some cold intolerance and her TSH was high at 5.5 With 25 g of levothyroxine she has had less fatigue and TSH has been more consistently normal  TSH is subsequently normal   Lab Results  Component Value Date   TSH 3.91 08/10/2017   TSH 3.50 01/18/2017   TSH 1.99 09/26/2016   FREET4 0.83 01/18/2017   FREET4 0.80 09/26/2016     She is vitamin D deficient, advised 5000 units vitamin D 3 from PCP   Review of Systems   LABS:  Lab on 08/10/2017  Component Date Value Ref Range Status  . Hgb A1c MFr Bld 08/10/2017 7.3* 4.6 - 6.5 % Final   Glycemic Control Guidelines for People with Diabetes:Non Diabetic:  <6%Goal of Therapy: <7%Additional Action Suggested:  >8%   . Sodium 08/10/2017 138  135 - 145 mEq/L Final  . Potassium 08/10/2017 4.2  3.5 - 5.1 mEq/L Final  . Chloride 08/10/2017 103  96 - 112 mEq/L Final  . CO2 08/10/2017 27  19 - 32 mEq/L Final  . Glucose, Bld 08/10/2017 156* 70 - 99 mg/dL Final  . BUN 47/82/9562 14  6 - 23 mg/dL Final  . Creatinine, Ser 08/10/2017 0.67  0.40 - 1.20 mg/dL Final  . Calcium 13/07/6577 9.6  8.4 - 10.5 mg/dL Final  . GFR 46/96/2952 95.16  >60.00 mL/min Final  . TSH 08/10/2017 3.91  0.35 - 4.50 uIU/mL Final  . Cholesterol 08/10/2017 175  0 - 200 mg/dL Final   ATP III Classification        Desirable:  < 200 mg/dL               Borderline High:  200 - 239 mg/dL          High:  > = 841 mg/dL  . Triglycerides 08/10/2017 124.0  0.0 - 149.0 mg/dL Final   Normal:  <324 mg/dLBorderline High:  150 - 199 mg/dL  . HDL 08/10/2017 38.30* >39.00 mg/dL Final  . VLDL 40/09/2724 24.8  0.0 - 40.0 mg/dL Final  . LDL Cholesterol 08/10/2017 112* 0 - 99 mg/dL Final  . Total CHOL/HDL Ratio 08/10/2017 5   Final                  Men          Women1/2 Average Risk     3.4          3.3Average Risk          5.0          4.42X Average Risk          9.6          7.13X Average Risk  15.0          11.0                      . NonHDL 08/10/2017 136.67   Final   NOTE:  Non-HDL goal should be 30 mg/dL higher than patient's LDL goal (i.e. LDL goal of < 70 mg/dL, would have non-HDL goal of < 100 mg/dL)    Physical Examination:  BP 132/78   Pulse 85   Ht  (1.575 m)   Wt 178 lb 6.4 oz (80.9 kg)   SpO2 97%   BMI 32.63 kg/m    ASSESSMENT:  Diabetes type 2, uncontrolled with BMI 33 See history of present illness for detailed discussion of current diabetes management, blood sugar patterns and problems identified  She is currently on basal insulin, Ozempic along with Invokana and metformin  Although her A1c is stable at 7.3 she appears to be needing more insulin now and has difficulty losing weight She is not able to use the freestyle Libre sensor consistently because of issues with adhesive Discussed that she is having high postprandial readings when she is going off her diet However still she is not able to comply because of anxiety and depression and not taking her antidepressant regularly  She can do her exercise also indoors instead off outdoors if she cannot stand the heat  HYPERTENSION: Controlled  LIPIDS: Will forward results to PCP, LDL over 100, needs higher dose of Zocor  PLAN:    She can go up to 1.0 mg on the Ozempic for better efficacy  Encouraged her to walk indoors or do  other aerobic exercises to help with insulin sensitivity and weight loss  She needs to be consistent with diet  Reassured her about the safety of Invokana and she will continue since she has taken this for some time without side effects  She will use the skin tac wipe to prepare her Josephine Igo sites and get more data  Also she can correlate her fingerstick readings with Libre sensor also  Discussed that she can cut back on her Lantus 4-6 units at least once fasting readings come back to low-normal and she does not have to target the readings down to 70-80  Discussed need for better postprandial readings and possibly needing Humalog they are consistently high  Follow-up in 3 months with A1c again  Patient Instructions  Take 1 Metformin at Bfst and lunch and 2 at dinner  Exercise at home with music videos  Ozempic  weekly  Counseling time on subjects discussed above is over 50% of today's 25 minute visit   Jarmal Lewelling 08/14/2017, 9:29 AM   Note: This office note was prepared with Insurance underwriter. Any transcriptional errors that result from this process are unintentional.

## 2017-08-14 NOTE — Patient Instructions (Addendum)
Take 1 Metformin at Bfst and lunch and 2 at dinner  Exercise at home with music videos  Ozempic  weekly

## 2017-08-14 NOTE — Telephone Encounter (Signed)
Called patient and left a voice message to let her know that I have [facility] and to please call back if she is not able to get the sensors or if they need to go to another pharmacy.

## 2017-08-14 NOTE — Telephone Encounter (Signed)
Patient called to notify Alison MurrayMegan F. that [facility] will not honor the discount so she has to pay $75. Patient is still going to go ahead and get the rx however, may have to switch to [facility] in the future if they will accept the discount, she will keep the office updated.

## 2017-08-14 NOTE — Telephone Encounter (Signed)
Patient coming in today 08/14/17 to get Rx for Continuous Blood Gluc Sensor (FREESTYLE LIBRE SENSOR SYSTEM) MISC. Patient needs sensors ASAP.

## 2017-10-11 ENCOUNTER — Other Ambulatory Visit: Payer: Self-pay | Admitting: Endocrinology

## 2017-10-15 ENCOUNTER — Other Ambulatory Visit: Payer: Self-pay | Admitting: Endocrinology

## 2017-11-05 ENCOUNTER — Other Ambulatory Visit: Payer: Self-pay | Admitting: Endocrinology

## 2017-11-08 ENCOUNTER — Other Ambulatory Visit: Payer: Self-pay

## 2017-11-08 ENCOUNTER — Emergency Department (HOSPITAL_BASED_OUTPATIENT_CLINIC_OR_DEPARTMENT_OTHER)
Admission: EM | Admit: 2017-11-08 | Discharge: 2017-11-08 | Disposition: A | Discharge status: Home / Self Care | Payer: BLUE CROSS/BLUE SHIELD | Attending: Emergency Medicine | Admitting: Emergency Medicine

## 2017-11-08 ENCOUNTER — Other Ambulatory Visit (INDEPENDENT_AMBULATORY_CARE_PROVIDER_SITE_OTHER): Payer: BLUE CROSS/BLUE SHIELD

## 2017-11-08 DIAGNOSIS — E1165 Type 2 diabetes mellitus with hyperglycemia: Secondary | ICD-10-CM | POA: Diagnosis not present

## 2017-11-08 DIAGNOSIS — Z794 Long term (current) use of insulin: Secondary | ICD-10-CM | POA: Diagnosis not present

## 2017-11-08 DIAGNOSIS — J45909 Unspecified asthma, uncomplicated: Secondary | ICD-10-CM | POA: Insufficient documentation

## 2017-11-08 DIAGNOSIS — E11649 Type 2 diabetes mellitus with hypoglycemia without coma: Secondary | ICD-10-CM | POA: Diagnosis present

## 2017-11-08 DIAGNOSIS — I10 Essential (primary) hypertension: Secondary | ICD-10-CM | POA: Insufficient documentation

## 2017-11-08 DIAGNOSIS — E782 Mixed hyperlipidemia: Secondary | ICD-10-CM

## 2017-11-08 DIAGNOSIS — E162 Hypoglycemia, unspecified: Secondary | ICD-10-CM

## 2017-11-08 LAB — COMPREHENSIVE METABOLIC PANEL
ALT: 16 U/L (ref 0–35)
AST: 18 U/L (ref 0–37)
Albumin: 4.5 g/dL (ref 3.5–5.2)
Alkaline Phosphatase: 75 U/L (ref 39–117)
BILIRUBIN TOTAL: 0.5 mg/dL (ref 0.2–1.2)
BUN: 15 mg/dL (ref 6–23)
CO2: 26 meq/L (ref 19–32)
CREATININE: 0.62 mg/dL (ref 0.40–1.20)
Calcium: 9.7 mg/dL (ref 8.4–10.5)
Chloride: 103 mEq/L (ref 96–112)
GFR: 103.99 mL/min (ref >60.00)
GLUCOSE: 140 mg/dL — AB (ref 70–99)
Potassium: 4.1 mEq/L (ref 3.5–5.1)
Sodium: 138 mEq/L (ref 135–145)
TOTAL PROTEIN: 7 g/dL (ref 6.0–8.3)

## 2017-11-08 LAB — BASIC METABOLIC PANEL
Anion gap: 9 (ref 5–15)
BUN: 21 mg/dL — AB (ref 6–20)
CALCIUM: 9.6 mg/dL (ref 8.9–10.3)
CHLORIDE: 102 mmol/L (ref 101–111)
CO2: 24 mmol/L (ref 22–32)
CREATININE: 0.81 mg/dL (ref 0.44–1.00)
GFR calc Af Amer: >60 mL/min (ref >60)
Glucose, Bld: 131 mg/dL — ABNORMAL HIGH (ref 65–99)
Potassium: 3.6 mmol/L (ref 3.5–5.1)
SODIUM: 135 mmol/L (ref 135–145)

## 2017-11-08 LAB — LDL CHOLESTEROL, DIRECT: LDL DIRECT: 104 mg/dL

## 2017-11-08 LAB — CBG MONITORING, ED
GLUCOSE-CAPILLARY: 131 mg/dL — AB (ref 65–99)
Glucose-Capillary: 92 mg/dL (ref 65–99)

## 2017-11-08 LAB — MICROALBUMIN / CREATININE URINE RATIO
Creatinine,U: 116.3 mg/dL
Microalb Creat Ratio: 0.8 mg/g (ref 0.0–30.0)
Microalb, Ur: 0.9 mg/dL (ref 0.0–1.9)

## 2017-11-08 LAB — HEMOGLOBIN A1C: Hgb A1c MFr Bld: 7.4 % — ABNORMAL HIGH (ref 4.6–6.5)

## 2017-11-08 NOTE — ED Notes (Signed)
ED Provider at bedside. 

## 2017-11-08 NOTE — ED Notes (Signed)
Pt received flu vaccination today

## 2017-11-08 NOTE — ED Triage Notes (Signed)
Pt newly started 1.5mg  ozympic today, pt reports her sugar has been dropping, lowest CBG was 54. Pt also states "ive had a lot of stressful events this week". Pt steady gate and in NAD

## 2017-11-08 NOTE — Discharge Instructions (Signed)
You were seen in the ED today with low blood sugar. We have watched you here and your blood sugars have remained normal. I encourage you to eat before going to bed and wake up in the night to check your sugar and eat a small meal if needed.   Return to the ED with any new or worsening symptoms.

## 2017-11-08 NOTE — ED Provider Notes (Signed)
Emergency Department Provider Note   I have reviewed the triage vital signs and the nursing notes.   HISTORY  Chief Complaint Hypoglycemia   HPI Natasha Chandler is a 61 y.o. female with PMH of asthma, DM, HTN, and HLD to the emergency department for evaluation of hypoglycemia.  The patient has been on Ozympic 1.5 mg for the last two weeks and notes volatile blood sugars.  Today she was experiencing some mild shakiness and when she took her blood sugars she got several low readings.  The lowest was 54.  She was fasting this morning for blood work but did eat lunch and dinner.  His sliding scale insulin for meals, Lantus, and metformin.  She is followed closely by endocrinology.  She takes the Ozympic injection weekly with the last dose today.    Past Medical History:  Diagnosis Date  . Asthma   . Diabetes mellitus   . Hyperlipidemia   . Hypertension   . Thyroid disease    Small goiter, stable  . Vitamin D deficiency     Patient Active Problem List   Diagnosis Date Noted  . Pain in left hip 05/23/2017  . Primary osteoarthritis of left hip 05/23/2017  . 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 Epistaxis 01/30/2017  . Perennial allergic rhinitis 01/30/2017  . Uncontrolled type 2 diabetes mellitus with hyperglycemia, with long-term current use of insulin (HCC) 09/28/2016  . 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 Pure hypercholesterolemia 02/18/2016  . Plantar fasciitis 07/05/2011  . Asthma 07/05/2011  . Multinodular goiter 07/05/2011  . Hypertension 07/05/2011    Past Surgical History:  Procedure Laterality Date  . TONSILLECTOMY      Current Outpatient Rx  . Order #: [id number] Class: Historical Med  . Order #: [id number] Class: Historical Med  . Order #: [id number] Class: Historical Med  . Order #: [id number] Class: Normal  . Order #: [id number] Class: Normal  . Order #: [id number] Class: Normal  . Order #: [id number] Class: Normal  . Order #: [id number] Class: Print  .  Order #: [id number] Class: Normal  . Order #: [id number] Class: Normal  . Order #: [id number] Class: Normal  . Order #: [id number] Class: Normal  . Order #: [id number] Class: Historical Med  . Order #: [id number] Class: Normal  . Order #: [id number] Class: Historical Med  . Order #: [id number] Class: Historical Med  . Order #: [id number] Class: Normal  . Order #: [id number] Class: Historical Med    Allergies Penicillins; Sulfa antibiotics; and Sulfasalazine  Family History  Problem Relation Age of Onset  . Diabetes Mother   . Diabetes Father   . Diabetes Maternal Aunt     Social History Social History   Tobacco Use  . Smoking status: Never Smoker  . Smokeless tobacco: Never Used  Substance Use Topics  . Alcohol use: No  . Drug use: Not on file    Review of Systems  Constitutional: No fever/chills. Positive hypoglycemia.  Eyes: No visual changes. ENT: No sore throat. Cardiovascular: Denies chest pain. Respiratory: Denies shortness of breath. Gastrointestinal: No abdominal pain.  No nausea, no vomiting.  No diarrhea.  No constipation. Genitourinary: Negative for dysuria. Musculoskeletal: Negative for back pain. Skin: Negative for rash. Neurological: Negative for headaches, focal weakness or numbness.  10-point ROS otherwise negative.  ____________________________________________   PHYSICAL EXAM:  VITAL SIGNS: ED Triage Vitals  Enc Vitals Group     BP 11/08/17 2134 (!) 158/85     Pulse Rate 11/08/17 2134 96     Resp 11/08/17 2134 18     Temp 11/08/17  2134 (!) 97.5 F (36.4 C)     Temp Source 11/08/17 2134 Oral     SpO2 11/08/17 2134 100 %     Weight 11/08/17 2133 178 lb (80.7 kg)     Height 11/08/17 2133 5\' 2"  (1.575 m)    Constitutional: Alert and oriented. Well appearing and in no acute distress. Eyes: Conjunctivae are normal.  Head: Atraumatic. Nose: No congestion/rhinnorhea. Mouth/Throat: Mucous membranes are moist.  Neck: No stridor.  Cardiovascular: Normal rate,  regular rhythm. Good peripheral circulation. Grossly normal heart sounds.   Respiratory: Normal respiratory effort.  No retractions. Lungs CTAB. Gastrointestinal: Soft and nontender. No distention.  Musculoskeletal: No lower extremity tenderness nor edema. No gross deformities of extremities. Neurologic:  Normal speech and language. No gross focal neurologic deficits are appreciated.  Skin:  Skin is warm, dry and intact. No rash noted.  ____________________________________________   LABS (all labs ordered are listed, but only abnormal results are displayed)  Labs Reviewed  BASIC METABOLIC PANEL - Abnormal; Notable for the following components:      Result Value   Glucose, Bld 131 (*)    BUN 21 (*)    All other components within normal limits  CBG MONITORING, ED - Abnormal; Notable for the following components:   Glucose-Capillary 131 (*)    All other components within normal limits  CBG MONITORING, ED  CBG MONITORING, ED  CBG MONITORING, ED   ____________________________________________   PROCEDURES  Procedure(s) performed:   Procedures  None ____________________________________________   INITIAL IMPRESSION / ASSESSMENT AND PLAN / ED COURSE  Pertinent labs & imaging results that were available during my care of the patient were reviewed by me and considered in my medical decision making (see chart for details).  Patient presents to the emergency department for evaluation of hypoglycemia.  She is on a long-acting insulin, Ozympic, with increased dose (1.5 mg) for the last 2 weeks.  No changes in her Lantus.  Blood sugar here is 92.  Plan for labs and ED observation with blood sugar monitoring Q1H.   11:40 PM Patient CBG has remained normal. Advised frequent checks, meals, and alarm in the night to wake up and check sugar. Will call Endocrinology in the AM for med adjustment PRN.   At this time, I do not feel there is any life-threatening condition present. I have  reviewed and discussed all results (EKG, imaging, lab, urine as appropriate), exam findings with patient. I have reviewed nursing notes and appropriate previous records.  I feel the patient is safe to be discharged home without further emergent workup. Discussed usual and customary return precautions. Patient and family (if present) verbalize understanding and are comfortable with this plan.  Patient will follow-up with their primary care provider. If they do not have a primary care provider, information for follow-up has been provided to them. All questions have been answered.  ____________________________________________  FINAL CLINICAL IMPRESSION(S) / ED DIAGNOSES  Final diagnoses:  Hypoglycemia    Note:  This document was prepared using Dragon voice recognition software and may include unintentional dictation errors.  Alona BeneJoshua Long, MD Emergency Medicine    Long, Arlyss RepressJoshua G, MD 11/09/17 [phone]

## 2017-11-09 ENCOUNTER — Telehealth: Payer: Self-pay | Admitting: Endocrinology

## 2017-11-09 NOTE — Telephone Encounter (Signed)
Patient took new medication Ozmpit 1.5 and ended up in the urgent care. Could not get sugar to stay up-she had top stay up all night to eat food all night long-please call and advise ph# [phone] She has not taken any medication today-trying to get her blood sugar up She needs to know how much reduction she needs to take on her medications

## 2017-11-09 NOTE — Telephone Encounter (Signed)
Please find out how she is taking 1.5 mg.  Her current prescription pen is for a 1 mg weekly.  1 mg is the appropriate dose and not too strong unless she has a problem with nausea or vomiting

## 2017-11-09 NOTE — Telephone Encounter (Signed)
Please see note. Please advise. 

## 2017-11-09 NOTE — Telephone Encounter (Signed)
Called patient and gave her the correct dosage. She stated that she will do this and call if she has any questions.

## 2017-11-09 NOTE — Telephone Encounter (Signed)
Called patient and she asked if she needed to lower her dosage of Ozempic? She is currently taking 1.5 mg and she stated she feels that this is just too strong for her.  Please advise.

## 2017-11-09 NOTE — Telephone Encounter (Signed)
She needs to stop her insulin completely.  Only when blood sugars are more than 130 consistently in the morning she can start with 6 units of her long acting insulin and increase gradually.  Her lab glucose recently was 140.  Work recommend continuing Ozempic as long as there is no nausea since it may allow her to get off insulin

## 2017-11-13 ENCOUNTER — Ambulatory Visit: Payer: BLUE CROSS/BLUE SHIELD | Admitting: Endocrinology

## 2017-11-16 ENCOUNTER — Encounter: Payer: Self-pay | Admitting: Endocrinology

## 2017-11-16 ENCOUNTER — Ambulatory Visit: Payer: BLUE CROSS/BLUE SHIELD | Admitting: Endocrinology

## 2017-11-16 VITALS — BP 126/82 | HR 76 | Ht 62.0 in | Wt 174.0 lb

## 2017-11-16 DIAGNOSIS — Z794 Long term (current) use of insulin: Secondary | ICD-10-CM | POA: Diagnosis not present

## 2017-11-16 DIAGNOSIS — I10 Essential (primary) hypertension: Secondary | ICD-10-CM

## 2017-11-16 DIAGNOSIS — E1165 Type 2 diabetes mellitus with hyperglycemia: Secondary | ICD-10-CM | POA: Diagnosis not present

## 2017-11-16 DIAGNOSIS — R110 Nausea: Secondary | ICD-10-CM

## 2017-11-16 NOTE — Patient Instructions (Addendum)
Metformin er 2 twice daily  Try 3/4 dose of Ozempic at dinner  Stay on 15 units as long as am sugar 100-120  Check blood sugars on waking up  daily  Also check blood sugars about 2 hours after a meal and do this after different meals by rotation  Recommended blood sugar levels on waking up is 90-130 and about 2 hours after meal is 130-160  Please bring your blood sugar monitor to each visit, thank you  Protein at meals

## 2017-11-16 NOTE — Progress Notes (Signed)
Patient ID: Natasha Chandler, female   DOB: Dec 06, 1955, 61 y.o.   MRN: [id number]           Reason for Appointment: Follow-up for Type 2 Diabetes  Referring physician: Serita Grammes  History of Present Illness:          Date of diagnosis of type 2 diabetes mellitus: 2004       Background history:   She was treated with oral hypoglycemic drugs like metformin and first few years Insulin was started around 2008-9 but details are not available. She thinks she has taken Lantus for several years, at some point was also taking Humalog. Trulicity was started 1-2 years ago, taking Invokana 3 yrs  Over the last 2-3 years she has been having poor control and has had difficulty focusing on her diabetes because of family issues and periodically not having insurance  Recent history:   INSULIN regimen is: Lantus 62 in am    Non-insulin hypoglycemic drugs the patient is taking are: Metformin ER 1g bid, Invokana 300 mg daily, Ozempic 1 mg weekly   A1c is still about the same at 7.4, previously as high as 9.8  Current blood sugar patterns and problems identified:  She has continued using the freestyle [place] sensor  She was recommended 1 mg Ozempic in September she probably started using this dose only about 6 or 8 weeks ago  She says the last couple of times she has had some nausea with this and when she took her injection on 12/6 last she started having low blood sugars with her glucose as low as 57 on her sensor around 5 PM, she went to the ER because of symptoms  This was even with reducing her insulin dose gradually  At that time she was told to stop insulin completely and only start with 10 units  She now says that she is trying to target her blood sugars at 70 the morning and does not know the desired range is  She is still having a little nausea on and off but not as much and she is concerned about Ozempic causing hypoglycemia  The last couple of days she has taken 15 units of LANTUS  and her most recent fasting glucose was 115  She is however having some high postprandial reading with the highest 215 after breakfast recently  She is still living some of her variability on stress  Again not finding the time to exercise as directed with various family issues  Her weight has come down 4 pounds  She is having some difficulty affording medications and the freestyle Libre sensor   Side effects from medications have been:none  Compliance with the medical regimen: Mostly good Hypoglycemia: as above  Glucose monitoring:  done 1-2 times a day         Glucometer: One Touch and freestyle Libre       Blood Glucose averages from the [place] sensor by download as below:  Recent usage of the freestyle libre sensor: 69% of the time 85% blood sugars are within the target range of 70-180, 14% above and 1% below 70  PRE-MEAL Fasting Lunch Dinner Bedtime Overall  Glucose range:       Mean/median: 117   138  142  141    POST-MEAL PC Breakfast PC Lunch PC Dinner  Glucose range:     Mean/median: 176  163  148      Self-care: The diet that the patient has been following is: tries  to limit fats.     Meal times are: Dinner: Usually at 7 pm    Typical meal intake: Breakfast is sometimes cereal       Dietician visit, most recent: 12/2015               Exercise:  not walking recentlyght history: 162-187  Wt Readings from Last 3 Encounters:  11/16/17 174 lb (78.9 kg)  11/08/17 178 lb (80.7 kg)  08/14/17 178 lb 6.4 oz (80.9 kg)    Glycemic control:   Lab Results  Component Value Date   HGBA1C 7.4 (H) 11/08/2017   HGBA1C 7.3 (H) 08/10/2017   HGBA1C 7.4 (H) 05/07/2017   Lab Results  Component Value Date   MICROALBUR 0.9 11/08/2017   LDLCALC 112 (H) 08/10/2017   CREATININE 0.81 11/08/2017   No visits with results within 1 Week(s) from this visit.  Latest known visit with results is:  Admission on 11/08/2017, Discharged on 11/08/2017  Component Date Value Ref Range  Status  . Glucose-Capillary 11/08/2017 92  65 - 99 mg/dL Final  . Glucose-Capillary 11/08/2017 131* 65 - 99 mg/dL Final  . Sodium 11/08/2017 135  135 - 145 mmol/L Final  . Potassium 11/08/2017 3.6  3.5 - 5.1 mmol/L Final  . Chloride 11/08/2017 102  101 - 111 mmol/L Final  . CO2 11/08/2017 24  22 - 32 mmol/L Final  . Glucose, Bld 11/08/2017 131* 65 - 99 mg/dL Final  . BUN 11/08/2017 21* 6 - 20 mg/dL Final  . Creatinine, Ser 11/08/2017 0.81  0.44 - 1.00 mg/dL Final  . Calcium 11/08/2017 9.6  8.9 - 10.3 mg/dL Final  . GFR calc non Af Amer 11/08/2017 >60  >60 mL/min Final  . GFR calc Af Amer 11/08/2017 >60  >60 mL/min Final   Comment: (NOTE) The eGFR has been calculated using the CKD EPI equation. This calculation has not been validated in all clinical situations. eGFR's persistently <60 mL/min signify possible Chronic Kidney Disease.   . Anion gap 11/08/2017 9  5 - 15 Final    Other active problems: See review of systems   Allergies as of 11/16/2017      Reactions   Penicillins Anaphylaxis, Rash   Has patient had a PCN reaction causing immediate rash, facial/tongue/throat swelling, SOB or lightheadedness with hypotension: Yes Has patient had a PCN reaction causing severe rash involving mucus membranes or skin necrosis: No Has patient had a PCN reaction that required hospitalization Yes Has patient had a PCN reaction occurring within the last 10 years: No If all of the above answers are "NO", then may proceed with Cephalosporin use. Has patient had a PCN reaction causing immediate rash, facial/tongue/throat swelling, SOB or lightheadedness with hypotension: Yes Has patient had a PCN reaction causing severe rash involving mucus membranes or skin necrosis: No Has patient had a PCN reaction that required hospitalization Yes Has patient had a PCN reaction occurring within the last 10 years: No If all of the above answers are "NO", then may proceed with Cephalosporin use.   Sulfa  Antibiotics Diarrhea   Sulfasalazine Diarrhea      Medication List        Accurate as of 11/16/17 11:49 AM. Always use your most recent med list.          albuterol 108 (90 Base) MCG/ACT inhaler Commonly known as:  PROVENTIL HFA;VENTOLIN HFA Inhale into the lungs.   amLODipine 10 MG tablet Commonly known as:  NORVASC Take 10 mg by mouth  daily.   canagliflozin 300 MG Tabs tablet Commonly known as:  INVOKANA TAKE 1 TABLET (300 MG TOTAL) BY MOUTH DAILY BEFORE BREAKFAST.   desvenlafaxine 50 MG 24 hr tablet Commonly known as:  PRISTIQ   FREESTYLE CONTROL SOLUTION Liqd Use to calibrate meter   FREESTYLE LIBRE SENSOR SYSTEM Misc CHANGE EVERY 10 DAYS AS DIRECTED   glucose blood test strip Commonly known as:  FREESTYLE PRECISION NEO TEST 1 each by Other route as needed for other. Use to test blood sugar once daily   ibuprofen 600 MG tablet Commonly known as:  ADVIL,MOTRIN Take 1 tablet (600 mg total) by mouth every 6 (six) hours as needed.   Insulin Glargine 100 UNIT/ML Solostar Pen Commonly known as:  LANTUS SOLOSTAR INJECT 68 UNITS INTO THE SKIN EVERY MORNING.   Insulin Pen Needle 31G X 5 MM Misc Use to inject insulin once daily   levothyroxine 25 MCG tablet Commonly known as:  SYNTHROID, LEVOTHROID TAKE ONE TABLET BY MOUTH ONE TIME DAILY BEFORE BREAKFAST   losartan 100 MG tablet Commonly known as:  COZAAR Take 100 mg by mouth daily.   metFORMIN 500 MG 24 hr tablet Commonly known as:  GLUCOPHAGE-XR TAKE 2 TABLETS BY MOUTH TWICE DAILY   OSPHENA 60 MG Tabs Generic drug:  Ospemifene Take 60 mg by mouth daily.   PREMARIN vaginal cream Generic drug:  conjugated estrogens   Semaglutide 1 MG/DOSE Sopn Commonly known as:  OZEMPIC Inject 1 mg into the skin once a week.   simvastatin 20 MG tablet Commonly known as:  ZOCOR Take 20 mg by mouth daily.       Allergies:  Allergies  Allergen Reactions  . Penicillins Anaphylaxis and Rash    Has patient had a  PCN reaction causing immediate rash, facial/tongue/throat swelling, SOB or lightheadedness with hypotension: Yes Has patient had a PCN reaction causing severe rash involving mucus membranes or skin necrosis: No Has patient had a PCN reaction that required hospitalization Yes Has patient had a PCN reaction occurring within the last 10 years: No If all of the above answers are "NO", then may proceed with Cephalosporin use. Has patient had a PCN reaction causing immediate rash, facial/tongue/throat swelling, SOB or lightheadedness with hypotension: Yes Has patient had a PCN reaction causing severe rash involving mucus membranes or skin necrosis: No Has patient had a PCN reaction that required hospitalization Yes Has patient had a PCN reaction occurring within the last 10 years: No If all of the above answers are "NO", then may proceed with Cephalosporin use.   . Sulfa Antibiotics Diarrhea  . Sulfasalazine Diarrhea    Past Medical History:  Diagnosis Date  . Asthma   . Diabetes mellitus   . Hyperlipidemia   . Hypertension   . Thyroid disease    Small goiter, stable  . Vitamin D deficiency     Past Surgical History:  Procedure Laterality Date  . TONSILLECTOMY      Family History  Problem Relation Age of Onset  . Diabetes Mother   . Diabetes Father   . Diabetes Maternal Aunt     Social History:  reports that  has never smoked. she has never used smokeless tobacco. She reports that she does not drink alcohol. Her drug history is not on file.    Review of Systems    Lipid history: Has low HDL and borderline LDL, on simvastatin 20 mg from PCP    Lab Results  Component Value Date   CHOL 175  08/10/2017   HDL 38.30 (L) 08/10/2017   LDLCALC 112 (H) 08/10/2017   LDLDIRECT 104.0 11/08/2017   TRIG 124.0 08/10/2017   CHOLHDL 5 08/10/2017           Hypertension: Treated with amlodipine and losartan100 mg  Most recent eye exam was 7/16  Most recent foot exam:  12/16  Thyroid: She has had a Nodular goiter since at least 2013 Thyroid biopsy was negative in 6/17 for a 1.9 cm isthmus  Nodule  HYPOTHYROIDISM: On her last visit in September2017 she was complaining about fatigue and some cold intolerance and her TSH was high at 5.5 She is since then empirically trying 25 g of levothyroxine and this had improved energy level  TSH is More recently normal   Lab Results  Component Value Date   TSH 3.91 08/10/2017   TSH 3.50 01/18/2017   TSH 1.99 09/26/2016   FREET4 0.83 01/18/2017   FREET4 0.80 09/26/2016     She is vitamin D deficient, advised 5000 units vitamin D 3 from PCP   Review of Systems   LABS:  No visits with results within 1 Week(s) from this visit.  Latest known visit with results is:  Admission on 11/08/2017, Discharged on 11/08/2017  Component Date Value Ref Range Status  . Glucose-Capillary 11/08/2017 92  65 - 99 mg/dL Final  . Glucose-Capillary 11/08/2017 131* 65 - 99 mg/dL Final  . Sodium 11/08/2017 135  135 - 145 mmol/L Final  . Potassium 11/08/2017 3.6  3.5 - 5.1 mmol/L Final  . Chloride 11/08/2017 102  101 - 111 mmol/L Final  . CO2 11/08/2017 24  22 - 32 mmol/L Final  . Glucose, Bld 11/08/2017 131* 65 - 99 mg/dL Final  . BUN 11/08/2017 21* 6 - 20 mg/dL Final  . Creatinine, Ser 11/08/2017 0.81  0.44 - 1.00 mg/dL Final  . Calcium 11/08/2017 9.6  8.9 - 10.3 mg/dL Final  . GFR calc non Af Amer 11/08/2017 >60  >60 mL/min Final  . GFR calc Af Amer 11/08/2017 >60  >60 mL/min Final   Comment: (NOTE) The eGFR has been calculated using the CKD EPI equation. This calculation has not been validated in all clinical situations. eGFR's persistently <60 mL/min signify possible Chronic Kidney Disease.   . Anion gap 11/08/2017 9  5 - 15 Final    Physical Examination:  BP 126/82   Pulse 76   Ht 5' 2" (1.575 m)   Wt 174 lb (78.9 kg)   SpO2 96%   BMI 31.83 kg/m    ASSESSMENT:  Diabetes type 2, uncontrolled with BMI  33 See history of present illness for detailed discussion of current diabetes management, blood sugar patterns and problems identified  She is currently on basal insulin, Ozempic along with Invokana and metformin A1c is now finally below 8% Not clear this is related to switching to Ozempic as yet However she is mostly having higher postprandial readings when he she goes off her diet or has more carbohydrate  With using the freestyle Libre sensor she is not able to see more clearly when her blood sugars are going up with various foods, highest readings aren't 300 with eating hot dogs She is trying to exercise with walking more and has lost 2 pounds, previously had desired a weight-loss drug  HYPERTENSION: Well controlled  LIPIDS: Will forward results to her PCP, recommend increasing Zocor to 40 mg or switching especially since she is on amlodipine also  PLAN:    She can  watch her fasting readings over the next few days and if starting to get low normal she can reduce her Lantus  More consistent diet and avoid high carbohydrate or high-fat meals  She will continue using the freestyle Libre sensor, co-pay card given  Advised  to avoid taking extra Lantus as this is a slow insulin  She'll continue 0.5 mg Ozempic weekly but consider increasing to 1 mg on the next visit, explained to her that she gets 2 mg in one pen  Continue walking regularly   Patient Instructions  Metformin er 2 twice daily  Try 3/4 dose of Ozempic at dinner  Stay on 15 units as long as am sugar 100-120  Check blood sugars on waking up  daily  Also check blood sugars about 2 hours after a meal and do this after different meals by rotation  Recommended blood sugar levels on waking up is 90-130 and about 2 hours after meal is 130-160  Please bring your blood sugar monitor to each visit, thank you  Protein at meals   Counseling time on subjects discussed above is over 50% of today's 25 minute  visit   Elayne Snare 11/16/2017, 11:49 AM   Note: This office note was prepared with Dragon voice recognition system technology. Any transcriptional errors that result from this process are unintentional.  Patient ID: Natasha Chandler, female   DOB: May 21, 1956, 61 y.o.   MRN: [id number]           Reason for Appointment: F/u for Type 2 Diabetes  Referring physician: Serita Grammes  History of Present Illness:          Date of diagnosis of type 2 diabetes mellitus: 2004       Background history:   She was treated with oral hypoglycemic drugs like metformin and first few years Insulin was started around 2008-9 but details are not available. She thinks she has taken Lantus for several years, at some point was also taking Humalog. Trulicity was started 1-2 years ago, taking Invokana 3 yrs  Over the last 2-3 years she has been having poor control and has had difficulty focusing on her diabetes because of family issues and periodically not having insurance  Recent history:   INSULIN regimen is: Lantus 68 in am    Non-insulin hypoglycemic drugs the patient is taking are: Metformin ER 1g bid, Invokana 300 mg daily, Victoza 1.90m   A1c is now relatively better at 7.4, previously as high as 9.8  Current blood sugar patterns and problems identified:  She has only in the last 3-4 days started using the freestyle Libre sensor in addition to her One Touch meter  Again usually on her monitor checking readings fasting and not after meals as much  She has started using the Ozempic about a month ago and did take at least 2 injections of the 0.5 dosage, she is tolerating this well and was able to get this with her co-pay card although with some difficulty  She has not had much change in her blood sugars on her monitor as yet although does not have any consistently high post pen  If she did have a significantly high reading of over 300 yesterday afternoon after eating 2 hot dogs  However she took  10 units Lantus extra yesterday and got hypoglycemic last night but blood sugar down to 69 and she treated it with a lot of food  Her overnight blood sugars are otherwise on her continuous monitor around 140  Fasting blood sugars on her fingersticks are variable ranging from 77-165  Postprandial readings otherwise have been occasionally around 180-200   Side effects from medications have been:none  Compliance with the medical regimen: Mostly good Hypoglycemia: Only once as above  Glucose monitoring:  done 1-2 times a day         Glucometer: One Touch and freestyle Libre       Blood Glucose readings by download as above:  Median blood sugar on her fingersticks overall 112  Self-care: The diet that the patient has been following is: tries to limit fats.     Meal times are: Dinner: 7 pm   Typical meal intake: Breakfast is cereal or eggs, sometimes higher fat meals           Dietician visit, most recent: 4 years ago               Exercise: walking more recently  Weight history: 162-187  Wt Readings from Last 3 Encounters:  11/16/17 174 lb (78.9 kg)  11/08/17 178 lb (80.7 kg)  08/14/17 178 lb 6.4 oz (80.9 kg)    Glycemic control:   Lab Results  Component Value Date   HGBA1C 7.4 (H) 11/08/2017   HGBA1C 7.3 (H) 08/10/2017   HGBA1C 7.4 (H) 05/07/2017   Lab Results  Component Value Date   MICROALBUR 0.9 11/08/2017   LDLCALC 112 (H) 08/10/2017   CREATININE 0.81 11/08/2017   No visits with results within 1 Week(s) from this visit.  Latest known visit with results is:  Admission on 11/08/2017, Discharged on 11/08/2017  Component Date Value Ref Range Status  . Glucose-Capillary 11/08/2017 92  65 - 99 mg/dL Final  . Glucose-Capillary 11/08/2017 131* 65 - 99 mg/dL Final  . Sodium 11/08/2017 135  135 - 145 mmol/L Final  . Potassium 11/08/2017 3.6  3.5 - 5.1 mmol/L Final  . Chloride 11/08/2017 102  101 - 111 mmol/L Final  . CO2 11/08/2017 24  22 - 32 mmol/L Final  . Glucose,  Bld 11/08/2017 131* 65 - 99 mg/dL Final  . BUN 11/08/2017 21* 6 - 20 mg/dL Final  . Creatinine, Ser 11/08/2017 0.81  0.44 - 1.00 mg/dL Final  . Calcium 11/08/2017 9.6  8.9 - 10.3 mg/dL Final  . GFR calc non Af Amer 11/08/2017 >60  >60 mL/min Final  . GFR calc Af Amer 11/08/2017 >60  >60 mL/min Final   Comment: (NOTE) The eGFR has been calculated using the CKD EPI equation. This calculation has not been validated in all clinical situations. eGFR's persistently <60 mL/min signify possible Chronic Kidney Disease.   . Anion gap 11/08/2017 9  5 - 15 Final    Other active problems: See review of systems   Allergies as of 11/16/2017      Reactions   Penicillins Anaphylaxis, Rash   Has patient had a PCN reaction causing immediate rash, facial/tongue/throat swelling, SOB or lightheadedness with hypotension: Yes Has patient had a PCN reaction causing severe rash involving mucus membranes or skin necrosis: No Has patient had a PCN reaction that required hospitalization Yes Has patient had a PCN reaction occurring within the last 10 years: No If all of the above answers are "NO", then may proceed with Cephalosporin use. Has patient had a PCN reaction causing immediate rash, facial/tongue/throat swelling, SOB or lightheadedness with hypotension: Yes Has patient had a PCN reaction causing severe rash involving mucus membranes or skin necrosis: No Has patient had a PCN reaction that required hospitalization  Yes Has patient had a PCN reaction occurring within the last 10 years: No If all of the above answers are "NO", then may proceed with Cephalosporin use.   Sulfa Antibiotics Diarrhea   Sulfasalazine Diarrhea      Medication List        Accurate as of 11/16/17 11:49 AM. Always use your most recent med list.          albuterol 108 (90 Base) MCG/ACT inhaler Commonly known as:  PROVENTIL HFA;VENTOLIN HFA Inhale into the lungs.   amLODipine 10 MG tablet Commonly known as:  NORVASC Take  10 mg by mouth daily.   canagliflozin 300 MG Tabs tablet Commonly known as:  INVOKANA TAKE 1 TABLET (300 MG TOTAL) BY MOUTH DAILY BEFORE BREAKFAST.   desvenlafaxine 50 MG 24 hr tablet Commonly known as:  PRISTIQ   FREESTYLE CONTROL SOLUTION Liqd Use to calibrate meter   FREESTYLE LIBRE SENSOR SYSTEM Misc CHANGE EVERY 10 DAYS AS DIRECTED   glucose blood test strip Commonly known as:  FREESTYLE PRECISION NEO TEST 1 each by Other route as needed for other. Use to test blood sugar once daily   ibuprofen 600 MG tablet Commonly known as:  ADVIL,MOTRIN Take 1 tablet (600 mg total) by mouth every 6 (six) hours as needed.   Insulin Glargine 100 UNIT/ML Solostar Pen Commonly known as:  LANTUS SOLOSTAR INJECT 68 UNITS INTO THE SKIN EVERY MORNING.   Insulin Pen Needle 31G X 5 MM Misc Use to inject insulin once daily   levothyroxine 25 MCG tablet Commonly known as:  SYNTHROID, LEVOTHROID TAKE ONE TABLET BY MOUTH ONE TIME DAILY BEFORE BREAKFAST   losartan 100 MG tablet Commonly known as:  COZAAR Take 100 mg by mouth daily.   metFORMIN 500 MG 24 hr tablet Commonly known as:  GLUCOPHAGE-XR TAKE 2 TABLETS BY MOUTH TWICE DAILY   OSPHENA 60 MG Tabs Generic drug:  Ospemifene Take 60 mg by mouth daily.   PREMARIN vaginal cream Generic drug:  conjugated estrogens   Semaglutide 1 MG/DOSE Sopn Commonly known as:  OZEMPIC Inject 1 mg into the skin once a week.   simvastatin 20 MG tablet Commonly known as:  ZOCOR Take 20 mg by mouth daily.       Allergies:  Allergies  Allergen Reactions  . Penicillins Anaphylaxis and Rash    Has patient had a PCN reaction causing immediate rash, facial/tongue/throat swelling, SOB or lightheadedness with hypotension: Yes Has patient had a PCN reaction causing severe rash involving mucus membranes or skin necrosis: No Has patient had a PCN reaction that required hospitalization Yes Has patient had a PCN reaction occurring within the last 10  years: No If all of the above answers are "NO", then may proceed with Cephalosporin use. Has patient had a PCN reaction causing immediate rash, facial/tongue/throat swelling, SOB or lightheadedness with hypotension: Yes Has patient had a PCN reaction causing severe rash involving mucus membranes or skin necrosis: No Has patient had a PCN reaction that required hospitalization Yes Has patient had a PCN reaction occurring within the last 10 years: No If all of the above answers are "NO", then may proceed with Cephalosporin use.   . Sulfa Antibiotics Diarrhea  . Sulfasalazine Diarrhea    Past Medical History:  Diagnosis Date  . Asthma   . Diabetes mellitus   . Hyperlipidemia   . Hypertension   . Thyroid disease    Small goiter, stable  . Vitamin D deficiency     Past  Surgical History:  Procedure Laterality Date  . TONSILLECTOMY      Family History  Problem Relation Age of Onset  . Diabetes Mother   . Diabetes Father   . Diabetes Maternal Aunt     Social History:  reports that  has never smoked. she has never used smokeless tobacco. She reports that she does not drink alcohol. Her drug history is not on file.    Review of Systems    Lipid history: Has low HDL and borderline LDL, on simvastatin 20 mg from PCP who is following her treatment    Lab Results  Component Value Date   CHOL 175 08/10/2017   HDL 38.30 (L) 08/10/2017   LDLCALC 112 (H) 08/10/2017   LDLDIRECT 104.0 11/08/2017   TRIG 124.0 08/10/2017   CHOLHDL 5 08/10/2017           Hypertension: Treated with amlodipine and losartan 100 mg,   Most recent foot exam: 8/18 from PCP   Thyroid: She has had a Nodular goiter since at least 2013 Thyroid biopsy was negative in 6/17 for a 1.9 cm  thmus  Nodule  HYPOTHYROIDISM: On her visit in September 2017 she was complaining about fatigue and some cold intolerance and her TSH was high at 5.5 With 25 g of levothyroxine she has had less fatigue and TSH has been  more consistently normal  TSH is subsequently normal as follows    Lab Results  Component Value Date   TSH 3.91 08/10/2017   TSH 3.50 01/18/2017   TSH 1.99 09/26/2016   FREET4 0.83 01/18/2017   FREET4 0.80 09/26/2016     She is vitamin D deficient, advised 5000 units vitamin D 3 from PCP   Review of Systems   LABS:  No visits with results within 1 Week(s) from this visit.  Latest known visit with results is:  Admission on 11/08/2017, Discharged on 11/08/2017  Component Date Value Ref Range Status  . Glucose-Capillary 11/08/2017 92  65 - 99 mg/dL Final  . Glucose-Capillary 11/08/2017 131* 65 - 99 mg/dL Final  . Sodium 11/08/2017 135  135 - 145 mmol/L Final  . Potassium 11/08/2017 3.6  3.5 - 5.1 mmol/L Final  . Chloride 11/08/2017 102  101 - 111 mmol/L Final  . CO2 11/08/2017 24  22 - 32 mmol/L Final  . Glucose, Bld 11/08/2017 131* 65 - 99 mg/dL Final  . BUN 11/08/2017 21* 6 - 20 mg/dL Final  . Creatinine, Ser 11/08/2017 0.81  0.44 - 1.00 mg/dL Final  . Calcium 11/08/2017 9.6  8.9 - 10.3 mg/dL Final  . GFR calc non Af Amer 11/08/2017 >60  >60 mL/min Final  . GFR calc Af Amer 11/08/2017 >60  >60 mL/min Final   Comment: (NOTE) The eGFR has been calculated using the CKD EPI equation. This calculation has not been validated in all clinical situations. eGFR's persistently <60 mL/min signify possible Chronic Kidney Disease.   . Anion gap 11/08/2017 9  5 - 15 Final    Physical Examination:  BP 126/82   Pulse 76   Ht 5' 2" (1.575 m)   Wt 174 lb (78.9 kg)   SpO2 96%   BMI 31.83 kg/m    ASSESSMENT:  Diabetes type 2, uncontrolled with BMI 33 See history of present illness for detailed discussion of current diabetes management, blood sugar patterns and problems identified  She is currently on basal insulin, Ozempic along with Invokana and metformin  Although her A1c is stable at 7.4 she  is not able to cut back on her insulin with increasing Ozempic Has lost a  little weight As discussed above she can do better with diet and exercise and have balanced meals Reassured her that Ozempic does not cause hypoglycemia and she needs to cut back on insulin since most of her low sugars appear to be before supper or waking up based on her intake  Currently with 15 Lantus her fasting reading is still excellent at 115 today She is having some difficulty affording the freestyle [place] and is using it only about 70% of the time recently However she can supplement this with her One Touch also    HYPERTENSION: Controlled   PLAN:    She can try dialing up only three quarters of the dose of the 1 mg Ozempic pen for now to see if nausea is better  She is also taking the injection with her meal in the evening  Stay on 15 of Lantus  Discussed blood sugar targets of 90-130 fasting and she does not need to increase her insulin as yet  She does need to start getting balanced meals and avoiding cereal in the morning since highest readings are after breakfast, discussed options for adding protein at breakfast  Discussed that Ozempic will hopefully avoid the need for mealtime insulin    Also discussed postprandial blood sugar targets  Continue to use the skin tac wipe to prepare her Elenor [place] sites and  use it regularly  Use the One Touch meter if she is not able to afford the sensor consistently  Need to start regular walking   Patient Instructions  Metformin er 2 twice daily  Try 3/4 dose of Ozempic at dinner  Stay on 15 units as long as am sugar 100-120  Check blood sugars on waking up  daily  Also check blood sugars about 2 hours after a meal and do this after different meals by rotation  Recommended blood sugar levels on waking up is 90-130 and about 2 hours after meal is 130-160  Please bring your blood sugar monitor to each visit, thank you  Protein at meals   Counseling time on subjects discussed above is over 50% of today's 25 minute  visit   Elayne Snare 11/16/2017, 11:49 AM   Note: This office note was prepared with Dragon voice recognition system technology. Any transcriptional errors that result from this process are unintentional.

## 2017-11-22 ENCOUNTER — Other Ambulatory Visit: Payer: Self-pay

## 2017-11-22 ENCOUNTER — Telehealth: Payer: Self-pay | Admitting: Endocrinology

## 2017-11-22 MED ORDER — GLUCOSE BLOOD VI STRP: ORAL_STRIP | 3 refills | Status: DC

## 2017-11-22 NOTE — Telephone Encounter (Signed)
Pt stated when she came into the office she received a new meter.   She is needing the onetouch verio test strips script sent it to the pharmacy.   She didn't have a script sent in last time because pt stated she had some already     [id number] - [place], [place] - [address]

## 2017-11-22 NOTE — Telephone Encounter (Signed)
Called patient and let her know that I have sent over these test strips [facility].

## 2017-11-30 ENCOUNTER — Other Ambulatory Visit: Payer: Self-pay | Admitting: Endocrinology

## 2017-12-03 NOTE — Telephone Encounter (Signed)
Pt stated the pharmacy is faxing over a paper for a script  Wanted to check in and see if we have seen this fax

## 2017-12-05 NOTE — Telephone Encounter (Signed)
I called the patient and she stated that she did receive her Ozempic today.

## 2017-12-05 NOTE — Telephone Encounter (Signed)
Patient stated that pharmacy has been trying to contact office concerning ozempic please advise

## 2018-01-04 ENCOUNTER — Other Ambulatory Visit: Payer: Self-pay | Admitting: Endocrinology

## 2018-02-11 ENCOUNTER — Other Ambulatory Visit (INDEPENDENT_AMBULATORY_CARE_PROVIDER_SITE_OTHER): Payer: BLUE CROSS/BLUE SHIELD

## 2018-02-11 DIAGNOSIS — Z794 Long term (current) use of insulin: Secondary | ICD-10-CM | POA: Diagnosis not present

## 2018-02-11 DIAGNOSIS — E1165 Type 2 diabetes mellitus with hyperglycemia: Secondary | ICD-10-CM | POA: Diagnosis not present

## 2018-02-11 LAB — COMPREHENSIVE METABOLIC PANEL
ALBUMIN: 4.1 g/dL (ref 3.5–5.2)
ALK PHOS: 72 U/L (ref 39–117)
ALT: 20 U/L (ref 0–35)
AST: 20 U/L (ref 0–37)
BILIRUBIN TOTAL: 0.4 mg/dL (ref 0.2–1.2)
BUN: 16 mg/dL (ref 6–23)
CALCIUM: 9.9 mg/dL (ref 8.4–10.5)
CO2: 25 meq/L (ref 19–32)
CREATININE: 0.62 mg/dL (ref 0.40–1.20)
Chloride: 104 mEq/L (ref 96–112)
GFR: 103.9 mL/min (ref >60.00)
Glucose, Bld: 135 mg/dL — ABNORMAL HIGH (ref 70–99)
Potassium: 4.2 mEq/L (ref 3.5–5.1)
Sodium: 140 mEq/L (ref 135–145)
TOTAL PROTEIN: 6.9 g/dL (ref 6.0–8.3)

## 2018-02-11 LAB — TSH: TSH: 4.1 u[IU]/mL (ref 0.35–4.50)

## 2018-02-11 LAB — LIPID PANEL
CHOLESTEROL: 159 mg/dL (ref 0–200)
HDL: 34.4 mg/dL — ABNORMAL LOW (ref >39.00)
LDL Cholesterol: 90 mg/dL (ref 0–99)
NonHDL: 125.01
TRIGLYCERIDES: 176 mg/dL — AB (ref 0.0–149.0)
Total CHOL/HDL Ratio: 5
VLDL: 35.2 mg/dL (ref 0.0–40.0)

## 2018-02-11 LAB — HEMOGLOBIN A1C: HEMOGLOBIN A1C: 7.8 % — AB (ref 4.6–6.5)

## 2018-02-12 ENCOUNTER — Other Ambulatory Visit: Payer: Self-pay | Admitting: Endocrinology

## 2018-02-14 ENCOUNTER — Ambulatory Visit: Payer: BLUE CROSS/BLUE SHIELD | Admitting: Endocrinology

## 2018-02-14 ENCOUNTER — Encounter: Payer: Self-pay | Admitting: Endocrinology

## 2018-02-14 VITALS — BP 140/84 | HR 84 | Wt 173.0 lb

## 2018-02-14 DIAGNOSIS — I10 Essential (primary) hypertension: Secondary | ICD-10-CM | POA: Diagnosis not present

## 2018-02-14 DIAGNOSIS — E782 Mixed hyperlipidemia: Secondary | ICD-10-CM

## 2018-02-14 DIAGNOSIS — Z794 Long term (current) use of insulin: Secondary | ICD-10-CM | POA: Diagnosis not present

## 2018-02-14 DIAGNOSIS — E1165 Type 2 diabetes mellitus with hyperglycemia: Secondary | ICD-10-CM

## 2018-02-14 DIAGNOSIS — E063 Autoimmune thyroiditis: Secondary | ICD-10-CM | POA: Diagnosis not present

## 2018-02-14 MED ORDER — GLUCOSE BLOOD VI STRP: ORAL_STRIP | 3 refills | Status: DC

## 2018-02-14 NOTE — Progress Notes (Signed)
Patient ID: Natasha Chandler, female   DOB: [birth year], 62 y.o.   MRN: [id number]           Reason for Appointment: Follow-up for Type 2 Diabetes  Referring physician: Cam Hai  History of Present Illness:          Date of diagnosis of type 2 diabetes mellitus: 2004       Background history:   She was treated with oral hypoglycemic drugs like metformin and first few years Insulin was started around 2008-9 but details are not available. She thinks she has taken Lantus for several years, at some point was also taking Humalog. Trulicity was started 1-2 years ago, taking Invokana 3 yrs  Over 2-3 years she has been having poor control and has had difficulty focusing on her diabetes because of family issues and periodically not having insurance  Recent history:   INSULIN regimen is: Lantus 50 in am    Non-insulin hypoglycemic drugs the patient is taking are: Metformin ER 1g bid, Invokana 300 mg daily, Ozempic 0.75 mg weekly   A1c is higher now at 7.8 and appears to be consistently over 7% However previously as high as 9.8  Current blood sugar patterns and problems identified:  She has discontinued using the freestyle Libre sensor because of the high out-of-pocket expense  Also because of financial restraints and not being able to remember or be compliant with day to day care she has not been taking her diabetes medications Ozempic consistently  Her blood sugars have been checked erratically with only 10 readings in the last month  Most of her blood sugars are high but she thinks her fasting readings are mostly higher when she is not eating healthy foods in the evenings and going out to eat frequently  Also controlling snacks at various times  Although she was taking as little as 15 units of insulin on the last visit in December she is now taking up to 50 units with this evening her blood sugars are high in the morning  Her highest blood sugars are late at night still over 200  frequently  Again she thinks that she has difficulty dealing with this stress that is preventing her from being compliant also   Side effects from medications have been:none  Compliance with the medical regimen: Mostly good Hypoglycemia: none  Glucose monitoring:  done 1-2 times a day         Glucometer: One Touch and freestyle Libre       Blood Glucose averages from monitor download  Recent range 99-216 with median 174 and only 10 readings, see description above  Self-care: The diet that the patient has been following is: tries to limit fats.     Meal times are: Dinner: Usually at 7 pm    Typical meal intake: Breakfast is sometimes cereal       Dietician visit, most recent: 12/2015               Exercise:  not walking recently starting to start back  Weight history: 162-187  Wt Readings from Last 3 Encounters:  02/14/18 173 lb (78.5 kg)  11/16/17 174 lb (78.9 kg)  11/08/17 178 lb (80.7 kg)    Glycemic control:   Lab Results  Component Value Date   HGBA1C 7.8 (H) 02/11/2018   HGBA1C 7.4 (H) 11/08/2017   HGBA1C 7.3 (H) 08/10/2017   Lab Results  Component Value Date   MICROALBUR 0.9 11/08/2017   LDLCALC 90 02/11/2018  CREATININE 0.62 02/11/2018   Lab on 02/11/2018  Component Date Value Ref Range Status  . TSH 02/11/2018 4.10  0.35 - 4.50 uIU/mL Final  . Cholesterol 02/11/2018 159  0 - 200 mg/dL Final   ATP III Classification       Desirable:  < 200 mg/dL               Borderline High:  200 - 239 mg/dL          High:  > = 161 mg/dL  . Triglycerides 02/11/2018 176.0* 0.0 - 149.0 mg/dL Final   Normal:  <096 mg/dLBorderline High:  150 - 199 mg/dL  . HDL 02/11/2018 34.40* >39.00 mg/dL Final  . VLDL 04/54/0981 35.2  0.0 - 40.0 mg/dL Final  . LDL Cholesterol 02/11/2018 90  0 - 99 mg/dL Final  . Total CHOL/HDL Ratio 02/11/2018 5   Final                  Men          Women1/2 Average Risk     3.4          3.3Average Risk          5.0          4.42X Average Risk           9.6          7.13X Average Risk          15.0          11.0                      . NonHDL 02/11/2018 125.01   Final   NOTE:  Non-HDL goal should be 30 mg/dL higher than patient's LDL goal (i.e. LDL goal of < 70 mg/dL, would have non-HDL goal of < 100 mg/dL)  . Sodium 02/11/2018 140  135 - 145 mEq/L Final  . Potassium 02/11/2018 4.2  3.5 - 5.1 mEq/L Final  . Chloride 02/11/2018 104  96 - 112 mEq/L Final  . CO2 02/11/2018 25  19 - 32 mEq/L Final  . Glucose, Bld 02/11/2018 135* 70 - 99 mg/dL Final  . BUN 19/14/7829 16  6 - 23 mg/dL Final  . Creatinine, Ser 02/11/2018 0.62  0.40 - 1.20 mg/dL Final  . Total Bilirubin 02/11/2018 0.4  0.2 - 1.2 mg/dL Final  . Alkaline Phosphatase 02/11/2018 72  39 - 117 U/L Final  . AST 02/11/2018 20  0 - 37 U/L Final  . ALT 02/11/2018 20  0 - 35 U/L Final  . Total Protein 02/11/2018 6.9  6.0 - 8.3 g/dL Final  . Albumin 56/21/3086 4.1  3.5 - 5.2 g/dL Final  . Calcium 57/84/6962 9.9  8.4 - 10.5 mg/dL Final  . GFR 95/28/4132 103.90  >60.00 mL/min Final  . Hgb A1c MFr Bld 02/11/2018 7.8* 4.6 - 6.5 % Final   Glycemic Control Guidelines for People with Diabetes:Non Diabetic:  <6%Goal of Therapy: <7%Additional Action Suggested:  >8%     Other active problems: See review of systems   Allergies as of 02/14/2018      Reactions   Penicillins Anaphylaxis, Rash   Has patient had a PCN reaction causing immediate rash, facial/tongue/throat swelling, SOB or lightheadedness with hypotension: Yes Has patient had a PCN reaction causing severe rash involving mucus membranes or skin necrosis: No Has patient had a PCN reaction that required hospitalization Yes Has patient had a PCN reaction occurring  within the last 10 years: No If all of the above answers are "NO", then may proceed with Cephalosporin use. Has patient had a PCN reaction causing immediate rash, facial/tongue/throat swelling, SOB or lightheadedness with hypotension: Yes Has patient had a PCN reaction causing  severe rash involving mucus membranes or skin necrosis: No Has patient had a PCN reaction that required hospitalization Yes Has patient had a PCN reaction occurring within the last 10 years: No If all of the above answers are "NO", then may proceed with Cephalosporin use.   Sulfa Antibiotics Diarrhea   Sulfasalazine Diarrhea      Medication List        Accurate as of 02/14/18  8:26 AM. Always use your most recent med list.          albuterol 108 (90 Base) MCG/ACT inhaler Commonly known as:  PROVENTIL HFA;VENTOLIN HFA Inhale into the lungs.   amLODipine 10 MG tablet Commonly known as:  NORVASC Take 10 mg by mouth daily.   canagliflozin 300 MG Tabs tablet Commonly known as:  INVOKANA TAKE 1 TABLET (300 MG TOTAL) BY MOUTH DAILY BEFORE BREAKFAST.   desvenlafaxine 50 MG 24 hr tablet Commonly known as:  PRISTIQ   FREESTYLE LIBRE SENSOR SYSTEM Misc CHANGE EVERY 10 DAYS AS DIRECTED   glucose blood test strip Commonly known as:  ONETOUCH VERIO Use as instructed to check blood sugars 2-3 times daily.   ibuprofen 600 MG tablet Commonly known as:  ADVIL,MOTRIN Take 1 tablet (600 mg total) by mouth every 6 (six) hours as needed.   Insulin Glargine 100 UNIT/ML Solostar Pen Commonly known as:  LANTUS SOLOSTAR INJECT 68 UNITS INTO THE SKIN EVERY MORNING.   LANTUS SOLOSTAR 100 UNIT/ML Solostar Pen Generic drug:  Insulin Glargine inject 68 units into the skin every morning   Insulin Pen Needle 31G X 5 MM Misc Use to inject insulin once daily   levothyroxine 25 MCG tablet Commonly known as:  SYNTHROID, LEVOTHROID TAKE ONE TABLET BY MOUTH ONE TIME DAILY BEFORE BREAKFAST   losartan 100 MG tablet Commonly known as:  COZAAR Take 100 mg by mouth daily.   metFORMIN 500 MG 24 hr tablet Commonly known as:  GLUCOPHAGE-XR TAKE TWO TABLETS BY MOUTH TWICE DAILY   OSPHENA 60 MG Tabs Generic drug:  Ospemifene Take 60 mg by mouth daily.   OZEMPIC 1 MG/DOSE Sopn Generic drug:   Semaglutide INJECT 1MG  INTO THE SKIN ONCE A WEEK   PREMARIN vaginal cream Generic drug:  conjugated estrogens   simvastatin 20 MG tablet Commonly known as:  ZOCOR Take 20 mg by mouth daily.       Allergies:  Allergies  Allergen Reactions  . Penicillins Anaphylaxis and Rash    Has patient had a PCN reaction causing immediate rash, facial/tongue/throat swelling, SOB or lightheadedness with hypotension: Yes Has patient had a PCN reaction causing severe rash involving mucus membranes or skin necrosis: No Has patient had a PCN reaction that required hospitalization Yes Has patient had a PCN reaction occurring within the last 10 years: No If all of the above answers are "NO", then may proceed with Cephalosporin use. Has patient had a PCN reaction causing immediate rash, facial/tongue/throat swelling, SOB or lightheadedness with hypotension: Yes Has patient had a PCN reaction causing severe rash involving mucus membranes or skin necrosis: No Has patient had a PCN reaction that required hospitalization Yes Has patient had a PCN reaction occurring within the last 10 years: No If all of the above answers  are "NO", then may proceed with Cephalosporin use.   . Sulfa Antibiotics Diarrhea  . Sulfasalazine Diarrhea    Past Medical History:  Diagnosis Date  . Asthma   . Diabetes mellitus   . Hyperlipidemia   . Hypertension   . Thyroid disease    Small goiter, stable  . Vitamin D deficiency     Past Surgical History:  Procedure Laterality Date  . TONSILLECTOMY      Family History  Problem Relation Age of Onset  . Diabetes Mother   . Diabetes Father   . Diabetes Maternal Aunt     Social History:  reports that  has never smoked. she has never used smokeless tobacco. She reports that she does not drink alcohol. Her drug history is not on file.    Review of Systems    Lipid history: Has low HDL and borderline LDL, on simvastatin 20 mg from PCP    Lab Results  Component  Value Date   CHOL 159 02/11/2018   HDL 34.40 (L) 02/11/2018   LDLCALC 90 02/11/2018   LDLDIRECT 104.0 11/08/2017   TRIG 176.0 (H) 02/11/2018   CHOLHDL 5 02/11/2018           Hypertension: Treated with amlodipine and losartan100 mg  Most recent eye exam was 7/16  Most recent foot exam: 12/16  Thyroid: She has had a Nodular goiter since at least 2013 Thyroid biopsy was negative in 6/17 for a 1.9 cm isthmus  Nodule  HYPOTHYROIDISM: On her visit in September2017 she was complaining about fatigue and some cold intolerance and her TSH was high at 5.5 She is since then empirically trying 25 g of levothyroxine and this had improved energy level  TSH is More recently normal   Lab Results  Component Value Date   TSH 4.10 02/11/2018   TSH 3.91 08/10/2017   TSH 3.50 01/18/2017   FREET4 0.83 01/18/2017   FREET4 0.80 09/26/2016     She is vitamin D deficient, advised 5000 units vitamin D 3 from PCP   Review of Systems   LABS:  Lab on 02/11/2018  Component Date Value Ref Range Status  . TSH 02/11/2018 4.10  0.35 - 4.50 uIU/mL Final  . Cholesterol 02/11/2018 159  0 - 200 mg/dL Final   ATP III Classification       Desirable:  < 200 mg/dL               Borderline High:  200 - 239 mg/dL          High:  > = 161 mg/dL  . Triglycerides 02/11/2018 176.0* 0.0 - 149.0 mg/dL Final   Normal:  <096 mg/dLBorderline High:  150 - 199 mg/dL  . HDL 02/11/2018 34.40* >39.00 mg/dL Final  . VLDL 04/54/0981 35.2  0.0 - 40.0 mg/dL Final  . LDL Cholesterol 02/11/2018 90  0 - 99 mg/dL Final  . Total CHOL/HDL Ratio 02/11/2018 5   Final                  Men          Women1/2 Average Risk     3.4          3.3Average Risk          5.0          4.42X Average Risk          9.6          7.13X Average Risk  15.0          11.0                      . NonHDL 02/11/2018 125.01   Final   NOTE:  Non-HDL goal should be 30 mg/dL higher than patient's LDL goal (i.e. LDL goal of < 70 mg/dL, would have non-HDL  goal of < 100 mg/dL)  . Sodium 02/11/2018 140  135 - 145 mEq/L Final  . Potassium 02/11/2018 4.2  3.5 - 5.1 mEq/L Final  . Chloride 02/11/2018 104  96 - 112 mEq/L Final  . CO2 02/11/2018 25  19 - 32 mEq/L Final  . Glucose, Bld 02/11/2018 135* 70 - 99 mg/dL Final  . BUN 16/09/9603 16  6 - 23 mg/dL Final  . Creatinine, Ser 02/11/2018 0.62  0.40 - 1.20 mg/dL Final  . Total Bilirubin 02/11/2018 0.4  0.2 - 1.2 mg/dL Final  . Alkaline Phosphatase 02/11/2018 72  39 - 117 U/L Final  . AST 02/11/2018 20  0 - 37 U/L Final  . ALT 02/11/2018 20  0 - 35 U/L Final  . Total Protein 02/11/2018 6.9  6.0 - 8.3 g/dL Final  . Albumin 54/08/8118 4.1  3.5 - 5.2 g/dL Final  . Calcium 14/78/2956 9.9  8.4 - 10.5 mg/dL Final  . GFR 21/30/8657 103.90  >60.00 mL/min Final  . Hgb A1c MFr Bld 02/11/2018 7.8* 4.6 - 6.5 % Final   Glycemic Control Guidelines for People with Diabetes:Non Diabetic:  <6%Goal of Therapy: <7%Additional Action Suggested:  >8%     Physical Examination:  BP 140/84 (BP Location: Right Arm, Patient Position: Sitting, Cuff Size: Large)   Pulse 84   Wt 173 lb (78.5 kg)   SpO2 98%   BMI 31.64 kg/m   Diabetic Foot Exam - Simple   Simple Foot Form Diabetic Foot exam was performed with the following findings:  Yes 02/14/2018  8:25 AM  Visual Inspection No deformities, no ulcerations, no other skin breakdown bilaterally:  Yes Sensation Testing Intact to touch and monofilament testing bilaterally:  Yes Pulse Check Posterior Tibialis and Dorsalis pulse intact bilaterally:  Yes Comments     ASSESSMENT:  Diabetes type 2, uncontrolled with BMI 33 See history of present illness for detailed discussion of current diabetes management, blood sugar patterns and problems identified  She is currently on basal insulin, Ozempic along with Invokana and metformin A1c is now finally below 8% Not clear this is related to switching to Ozempic as yet However she is mostly having higher postprandial  readings when he she goes off her diet or has more carbohydrate  With using the freestyle Libre sensor she is not able to see more clearly when her blood sugars are going up with various foods, highest readings aren't 300 with eating hot dogs She is trying to exercise with walking more and has lost 2 pounds, previously had desired a weight-loss drug  HYPERTENSION: Well controlled  LIPIDS: Will forward results to her PCP, recommend increasing Zocor to 40 mg or switching especially since she is on amlodipine also  PLAN:    She can watch her fasting readings over the next few days and if starting to get low normal she can reduce her Lantus  More consistent diet and avoid high carbohydrate or high-fat meals  She will continue using the freestyle Libre sensor, co-pay card given  Advised  to avoid taking extra Lantus as this is a slow insulin  She'll continue  0.5 mg Ozempic weekly but consider increasing to 1 mg on the next visit, explained to her that she gets 2 mg in one pen  Continue walking regularly   Patient Instructions  Check blood sugars on waking up  3/7 days  Also check blood sugars about 2 hours after a meal and do this after different meals by rotation  Recommended blood sugar levels on waking up is 90-130 and about 2 hours after meal is 130-160  Please bring your blood sugar monitor to each visit, thank you  Adjust lantus based on am sugar trend  Eye exam  Counseling time on subjects discussed above is over 50% of today's 25 minute visit   Reather Littler 02/14/2018, 8:26 AM   Note: This office note was prepared with Dragon voice recognition system technology. Any transcriptional errors that result from this process are unintentional.  Patient ID: Natasha Chandler, female   DOB: Jul 20, 1956, 62 y.o.   MRN: [id number]           Reason for Appointment: F/u for Type 2 Diabetes  Referring physician: Cam Hai  History of Present Illness:          Date of diagnosis  of type 2 diabetes mellitus: 2004       Background history:   She was treated with oral hypoglycemic drugs like metformin and first few years Insulin was started around 2008-9 but details are not available. She thinks she has taken Lantus for several years, at some point was also taking Humalog. Trulicity was started 1-2 years ago, taking Invokana 3 yrs  Over the last 2-3 years she has been having poor control and has had difficulty focusing on her diabetes because of family issues and periodically not having insurance  Recent history:   INSULIN regimen is: Lantus 68 in am    Non-insulin hypoglycemic drugs the patient is taking are: Metformin ER 1g bid, Invokana 300 mg daily, Victoza 1.8mg    A1c is now relatively better at 7.4, previously as high as 9.8  Current blood sugar patterns and problems identified:  She has only in the last 3-4 days started using the freestyle Libre sensor in addition to her One Touch meter  Again usually on her monitor checking readings fasting and not after meals as much  She has started using the Ozempic about a month ago and did take at least 2 injections of the 0.5 dosage, she is tolerating this well and was able to get this with her co-pay card although with some difficulty  She has not had much change in her blood sugars on her monitor as yet although does not have any consistently high post pen  If she did have a significantly high reading of over 300 yesterday afternoon after eating 2 hot dogs  However she took 10 units Lantus extra yesterday and got hypoglycemic last night but blood sugar down to 69 and she treated it with a lot of food  Her overnight blood sugars are otherwise on her continuous monitor around 140  Fasting blood sugars on her fingersticks are variable ranging from 77-165  Postprandial readings otherwise have been occasionally around 180-200   Side effects from medications have been:none  Compliance with the medical regimen:  Mostly good Hypoglycemia: Only once as above  Glucose monitoring:  done 1-2 times a day         Glucometer: One Touch and freestyle Libre       Blood Glucose readings by download as above:  Median blood sugar on her fingersticks overall 112  Self-care: The diet that the patient has been following is: tries to limit fats.     Meal times are: Dinner: 7 pm   Typical meal intake: Breakfast is cereal or eggs, sometimes higher fat meals           Dietician visit, most recent: 4 years ago               Exercise: walking more recently  Weight history: 162-187  Wt Readings from Last 3 Encounters:  02/14/18 173 lb (78.5 kg)  11/16/17 174 lb (78.9 kg)  11/08/17 178 lb (80.7 kg)    Glycemic control:   Lab Results  Component Value Date   HGBA1C 7.8 (H) 02/11/2018   HGBA1C 7.4 (H) 11/08/2017   HGBA1C 7.3 (H) 08/10/2017   Lab Results  Component Value Date   MICROALBUR 0.9 11/08/2017   LDLCALC 90 02/11/2018   CREATININE 0.62 02/11/2018   Lab on 02/11/2018  Component Date Value Ref Range Status  . TSH 02/11/2018 4.10  0.35 - 4.50 uIU/mL Final  . Cholesterol 02/11/2018 159  0 - 200 mg/dL Final   ATP III Classification       Desirable:  < 200 mg/dL               Borderline High:  200 - 239 mg/dL          High:  > = 161 mg/dL  . Triglycerides 02/11/2018 176.0* 0.0 - 149.0 mg/dL Final   Normal:  <096 mg/dLBorderline High:  150 - 199 mg/dL  . HDL 02/11/2018 34.40* >39.00 mg/dL Final  . VLDL 04/54/0981 35.2  0.0 - 40.0 mg/dL Final  . LDL Cholesterol 02/11/2018 90  0 - 99 mg/dL Final  . Total CHOL/HDL Ratio 02/11/2018 5   Final                  Men          Women1/2 Average Risk     3.4          3.3Average Risk          5.0          4.42X Average Risk          9.6          7.13X Average Risk          15.0          11.0                      . NonHDL 02/11/2018 125.01   Final   NOTE:  Non-HDL goal should be 30 mg/dL higher than patient's LDL goal (i.e. LDL goal of < 70 mg/dL, would have  non-HDL goal of < 100 mg/dL)  . Sodium 02/11/2018 140  135 - 145 mEq/L Final  . Potassium 02/11/2018 4.2  3.5 - 5.1 mEq/L Final  . Chloride 02/11/2018 104  96 - 112 mEq/L Final  . CO2 02/11/2018 25  19 - 32 mEq/L Final  . Glucose, Bld 02/11/2018 135* 70 - 99 mg/dL Final  . BUN 19/14/7829 16  6 - 23 mg/dL Final  . Creatinine, Ser 02/11/2018 0.62  0.40 - 1.20 mg/dL Final  . Total Bilirubin 02/11/2018 0.4  0.2 - 1.2 mg/dL Final  . Alkaline Phosphatase 02/11/2018 72  39 - 117 U/L Final  . AST 02/11/2018 20  0 - 37 U/L Final  . ALT 02/11/2018  20  0 - 35 U/L Final  . Total Protein 02/11/2018 6.9  6.0 - 8.3 g/dL Final  . Albumin 16/09/9603 4.1  3.5 - 5.2 g/dL Final  . Calcium 54/08/8118 9.9  8.4 - 10.5 mg/dL Final  . GFR 14/78/2956 103.90  >60.00 mL/min Final  . Hgb A1c MFr Bld 02/11/2018 7.8* 4.6 - 6.5 % Final   Glycemic Control Guidelines for People with Diabetes:Non Diabetic:  <6%Goal of Therapy: <7%Additional Action Suggested:  >8%     Other active problems: See review of systems   Allergies as of 02/14/2018      Reactions   Penicillins Anaphylaxis, Rash   Has patient had a PCN reaction causing immediate rash, facial/tongue/throat swelling, SOB or lightheadedness with hypotension: Yes Has patient had a PCN reaction causing severe rash involving mucus membranes or skin necrosis: No Has patient had a PCN reaction that required hospitalization Yes Has patient had a PCN reaction occurring within the last 10 years: No If all of the above answers are "NO", then may proceed with Cephalosporin use. Has patient had a PCN reaction causing immediate rash, facial/tongue/throat swelling, SOB or lightheadedness with hypotension: Yes Has patient had a PCN reaction causing severe rash involving mucus membranes or skin necrosis: No Has patient had a PCN reaction that required hospitalization Yes Has patient had a PCN reaction occurring within the last 10 years: No If all of the above answers are "NO",  then may proceed with Cephalosporin use.   Sulfa Antibiotics Diarrhea   Sulfasalazine Diarrhea      Medication List        Accurate as of 02/14/18  8:26 AM. Always use your most recent med list.          albuterol 108 (90 Base) MCG/ACT inhaler Commonly known as:  PROVENTIL HFA;VENTOLIN HFA Inhale into the lungs.   amLODipine 10 MG tablet Commonly known as:  NORVASC Take 10 mg by mouth daily.   canagliflozin 300 MG Tabs tablet Commonly known as:  INVOKANA TAKE 1 TABLET (300 MG TOTAL) BY MOUTH DAILY BEFORE BREAKFAST.   desvenlafaxine 50 MG 24 hr tablet Commonly known as:  PRISTIQ   FREESTYLE LIBRE SENSOR SYSTEM Misc CHANGE EVERY 10 DAYS AS DIRECTED   glucose blood test strip Commonly known as:  ONETOUCH VERIO Use as instructed to check blood sugars 2-3 times daily.   ibuprofen 600 MG tablet Commonly known as:  ADVIL,MOTRIN Take 1 tablet (600 mg total) by mouth every 6 (six) hours as needed.   Insulin Glargine 100 UNIT/ML Solostar Pen Commonly known as:  LANTUS SOLOSTAR INJECT 68 UNITS INTO THE SKIN EVERY MORNING.   LANTUS SOLOSTAR 100 UNIT/ML Solostar Pen Generic drug:  Insulin Glargine inject 68 units into the skin every morning   Insulin Pen Needle 31G X 5 MM Misc Use to inject insulin once daily   levothyroxine 25 MCG tablet Commonly known as:  SYNTHROID, LEVOTHROID TAKE ONE TABLET BY MOUTH ONE TIME DAILY BEFORE BREAKFAST   losartan 100 MG tablet Commonly known as:  COZAAR Take 100 mg by mouth daily.   metFORMIN 500 MG 24 hr tablet Commonly known as:  GLUCOPHAGE-XR TAKE TWO TABLETS BY MOUTH TWICE DAILY   OSPHENA 60 MG Tabs Generic drug:  Ospemifene Take 60 mg by mouth daily.   OZEMPIC 1 MG/DOSE Sopn Generic drug:  Semaglutide INJECT 1MG  INTO THE SKIN ONCE A WEEK   PREMARIN vaginal cream Generic drug:  conjugated estrogens   simvastatin 20 MG tablet Commonly known as:  ZOCOR Take 20 mg by mouth daily.       Allergies:  Allergies    Allergen Reactions  . Penicillins Anaphylaxis and Rash    Has patient had a PCN reaction causing immediate rash, facial/tongue/throat swelling, SOB or lightheadedness with hypotension: Yes Has patient had a PCN reaction causing severe rash involving mucus membranes or skin necrosis: No Has patient had a PCN reaction that required hospitalization Yes Has patient had a PCN reaction occurring within the last 10 years: No If all of the above answers are "NO", then may proceed with Cephalosporin use. Has patient had a PCN reaction causing immediate rash, facial/tongue/throat swelling, SOB or lightheadedness with hypotension: Yes Has patient had a PCN reaction causing severe rash involving mucus membranes or skin necrosis: No Has patient had a PCN reaction that required hospitalization Yes Has patient had a PCN reaction occurring within the last 10 years: No If all of the above answers are "NO", then may proceed with Cephalosporin use.   . Sulfa Antibiotics Diarrhea  . Sulfasalazine Diarrhea    Past Medical History:  Diagnosis Date  . Asthma   . Diabetes mellitus   . Hyperlipidemia   . Hypertension   . Thyroid disease    Small goiter, stable  . Vitamin D deficiency     Past Surgical History:  Procedure Laterality Date  . TONSILLECTOMY      Family History  Problem Relation Age of Onset  . Diabetes Mother   . Diabetes Father   . Diabetes Maternal Aunt     Social History:  reports that  has never smoked. she has never used smokeless tobacco. She reports that she does not drink alcohol. Her drug history is not on file.    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 Thyroid biopsy was negative in 6/17 for a 1.9 cm  thmus  Nodule  HYPOTHYROIDISM: On her visit in September 2017 she was complaining about fatigue and some cold intolerance and her TSH was high at 5.5 With 25 g of levothyroxine she has had less fatigue and TSH has been in the normal range although still upper normal She says she does have fatigue but this is mostly from her stress and difficulty sleeping    TSH is normal as follows    Lab Results  Component Value Date   TSH 4.10 02/11/2018   TSH 3.91 08/10/2017   TSH 3.50 01/18/2017   FREET4 0.83 01/18/2017   FREET4 0.80 09/26/2016     She is vitamin D deficient, advised 5000 units vitamin D 3 from PCP   Review of Systems   LABS:  Lab on 02/11/2018  Component Date Value Ref Range Status  . TSH 02/11/2018 4.10  0.35 - 4.50 uIU/mL Final  . Cholesterol 02/11/2018 159  0 - 200 mg/dL Final   ATP III Classification       Desirable:  < 200 mg/dL  Borderline High:  200 - 239 mg/dL          High:  > = 161 mg/dL  . Triglycerides 02/11/2018 176.0* 0.0 - 149.0 mg/dL Final   Normal:  <096 mg/dLBorderline High:  150 - 199 mg/dL  . HDL 02/11/2018 34.40* >39.00 mg/dL Final  . VLDL 04/54/0981 35.2  0.0 - 40.0 mg/dL Final  . LDL Cholesterol 02/11/2018 90  0 - 99 mg/dL Final  . Total CHOL/HDL Ratio 02/11/2018 5   Final                  Men          Women1/2 Average Risk     3.4          3.3Average Risk          5.0          4.42X Average Risk          9.6          7.13X Average Risk          15.0          11.0                      . NonHDL 02/11/2018 125.01   Final   NOTE:  Non-HDL goal should be 30 mg/dL higher than patient's LDL goal (i.e. LDL goal of < 70 mg/dL, would have non-HDL goal of < 100 mg/dL)  . Sodium 02/11/2018 140  135 - 145 mEq/L Final  . Potassium 02/11/2018 4.2  3.5 - 5.1 mEq/L  Final  . Chloride 02/11/2018 104  96 - 112 mEq/L Final  . CO2 02/11/2018 25  19 - 32 mEq/L Final  . Glucose, Bld 02/11/2018 135* 70 - 99 mg/dL Final  . BUN 19/14/7829 16  6 - 23 mg/dL Final  . Creatinine, Ser 02/11/2018 0.62  0.40 - 1.20 mg/dL Final  . Total Bilirubin 02/11/2018 0.4  0.2 - 1.2 mg/dL Final  . Alkaline Phosphatase 02/11/2018 72  39 - 117 U/L Final  . AST 02/11/2018 20  0 - 37 U/L Final  . ALT 02/11/2018 20  0 - 35 U/L Final  . Total Protein 02/11/2018 6.9  6.0 - 8.3 g/dL Final  . Albumin 56/21/3086 4.1  3.5 - 5.2 g/dL Final  . Calcium 57/84/6962 9.9  8.4 - 10.5 mg/dL Final  . GFR 95/28/4132 103.90  >60.00 mL/min Final  . Hgb A1c MFr Bld 02/11/2018 7.8* 4.6 - 6.5 % Final   Glycemic Control Guidelines for People with Diabetes:Non Diabetic:  <6%Goal of Therapy: <7%Additional Action Suggested:  >8%     Physical Examination:  BP 140/84 (BP Location: Right Arm, Patient Position: Sitting, Cuff Size: Large)   Pulse 84   Wt 173 lb (78.5 kg)   SpO2 98%   BMI 31.64 kg/m    ASSESSMENT:  Diabetes type 2, uncontrolled with BMI 33 See history of present illness for detailed discussion of current diabetes management, blood sugar patterns and problems identified  She is currently on basal insulin, Ozempic along with Invokana and metformin However because of financial difficulties and also not paying attention to her daily routine or levels forgetting to do things she has been very erratic with her diabetes regimen  Although she has not gained weight she does have mostly high readings at home with only one exception With this noncompliance especially with Ozempic in her diet she has again required larger doses of insulin  Previously with nearly 1 mg Ozempic weekly she had required only minimal amount of basal insulin  Blood sugar monitoring also has been erratic   HYPERTENSION: Controlled relatively higher today better with her PCP recently  LIPIDS:  Controlled although  triglycerides slightly high  HYPOTHYROIDISM: Mild and TSH is stable in the upper normal range, will continue the same dose for now  PLAN:    She will try to get more compliance diabetes management  She will continue 50 units of Lantus for now but if she starts seeing blood sugars out of range in the morning she will adjust the dose by 2-4 units every 3 days  She will try to continue Ozempic 1 mg which she started this week and has so far tolerated this without nausea as before  She will try to set aside time to walk for exercise daily  Make better choices when eating out avoid high fat foods  She will check blood sugars more consistently after meals also to help her adjust her diet, discussed blood sugar targets at various times  No change in Invokana  Follow-up in 3 months  Patient Instructions  Check blood sugars on waking up  3/7 days  Also check blood sugars about 2 hours after a meal and do this after different meals by rotation  Recommended blood sugar levels on waking up is 90-130 and about 2 hours after meal is 130-160  Please bring your blood sugar monitor to each visit, thank you  Adjust lantus based on am sugar trend  Eye exam    Counseling time on subjects discussed in assessment and plan sections is over 50% of today's 25 minute visit  Reather LittlerAjay Bradey Luzier 02/14/2018, 8:26 AM   Note: This office note was prepared with Dragon voice recognition system technology. Any transcriptional errors that result from this process are unintentional.

## 2018-02-14 NOTE — Patient Instructions (Addendum)
Check blood sugars on waking up  3/7 days  Also check blood sugars about 2 hours after a meal and do this after different meals by rotation  Recommended blood sugar levels on waking up is 90-130 and about 2 hours after meal is 130-160  Please bring your blood sugar monitor to each visit, thank you  Adjust lantus based on am sugar trend  Eye exam

## 2018-03-08 ENCOUNTER — Other Ambulatory Visit: Payer: Self-pay | Admitting: Endocrinology

## 2018-03-19 ENCOUNTER — Other Ambulatory Visit: Payer: Self-pay | Admitting: Endocrinology

## 2018-04-12 ENCOUNTER — Other Ambulatory Visit: Payer: Self-pay | Admitting: Endocrinology

## 2018-05-06 ENCOUNTER — Other Ambulatory Visit: Payer: Self-pay | Admitting: Endocrinology

## 2018-05-06 ENCOUNTER — Other Ambulatory Visit: Payer: Self-pay

## 2018-05-06 MED ORDER — LEVOTHYROXINE SODIUM 25 MCG PO TABS: ORAL_TABLET | ORAL | 4 refills | Status: DC

## 2018-05-14 ENCOUNTER — Other Ambulatory Visit (INDEPENDENT_AMBULATORY_CARE_PROVIDER_SITE_OTHER): Payer: BLUE CROSS/BLUE SHIELD

## 2018-05-14 DIAGNOSIS — E1165 Type 2 diabetes mellitus with hyperglycemia: Secondary | ICD-10-CM

## 2018-05-14 DIAGNOSIS — Z794 Long term (current) use of insulin: Secondary | ICD-10-CM | POA: Diagnosis not present

## 2018-05-14 LAB — BASIC METABOLIC PANEL
BUN: 17 mg/dL (ref 6–23)
CO2: 27 meq/L (ref 19–32)
Calcium: 9.8 mg/dL (ref 8.4–10.5)
Chloride: 102 mEq/L (ref 96–112)
Creatinine, Ser: 0.6 mg/dL (ref 0.40–1.20)
GFR: 107.82 mL/min (ref >60.00)
GLUCOSE: 116 mg/dL — AB (ref 70–99)
POTASSIUM: 3.9 meq/L (ref 3.5–5.1)
SODIUM: 138 meq/L (ref 135–145)

## 2018-05-14 LAB — HEMOGLOBIN A1C: Hgb A1c MFr Bld: 8.2 % — ABNORMAL HIGH (ref 4.6–6.5)

## 2018-05-14 LAB — TSH: TSH: 4.12 u[IU]/mL (ref 0.35–4.50)

## 2018-05-16 ENCOUNTER — Other Ambulatory Visit: Payer: Self-pay | Admitting: Endocrinology

## 2018-05-17 ENCOUNTER — Encounter: Payer: Self-pay | Admitting: Endocrinology

## 2018-05-17 ENCOUNTER — Ambulatory Visit: Payer: BLUE CROSS/BLUE SHIELD | Admitting: Endocrinology

## 2018-05-17 VITALS — BP 132/74 | HR 74 | Ht 62.0 in | Wt 172.2 lb

## 2018-05-17 DIAGNOSIS — E1165 Type 2 diabetes mellitus with hyperglycemia: Secondary | ICD-10-CM | POA: Diagnosis not present

## 2018-05-17 DIAGNOSIS — E063 Autoimmune thyroiditis: Secondary | ICD-10-CM

## 2018-05-17 DIAGNOSIS — I10 Essential (primary) hypertension: Secondary | ICD-10-CM | POA: Diagnosis not present

## 2018-05-17 DIAGNOSIS — Z794 Long term (current) use of insulin: Secondary | ICD-10-CM | POA: Diagnosis not present

## 2018-05-17 MED ORDER — INSULIN DEGLUDEC 200 UNIT/ML ~~LOC~~ SOPN: 68.0000 [IU] | PEN_INJECTOR | Freq: Every day | SUBCUTANEOUS | 0 refills | Status: DC

## 2018-05-17 NOTE — Patient Instructions (Signed)
Check blood sugars on waking up  4/7 days  Also check blood sugars about 2 hours after a meal and do this after different meals by rotation  Recommended blood sugar levels on waking up is 90-130 and about 2 hours after meal is 130-160  Please bring your blood sugar monitor to each visit, thank you  Call if sugars stay > 200 after meals

## 2018-05-17 NOTE — Progress Notes (Signed)
Patient ID: Natasha Chandler, female   DOB: 05-27-1956, 62 y.o.   MRN: [id number]           Reason for Appointment: Follow-up for Type 2 Diabetes  Referring physician: Cam Hai  History of Present Illness:          Date of diagnosis of type 2 diabetes mellitus: 2004       Background history:   She was treated with oral hypoglycemic drugs like metformin and first few years Insulin was started around 2008-9 but details are not available. She thinks she has taken Lantus for several years, at some point was also taking Humalog. Trulicity was started 1-2 years ago, taking Invokana 3 yrs  Over 2-3 years she has been having poor control and has had difficulty focusing on her diabetes because of family issues and periodically not having insurance  Recent history:   INSULIN regimen is: Lantus 62 in am    Non-insulin hypoglycemic drugs the patient is taking are: Metformin ER 1g bid, Invokana 300 mg daily, Ozempic 1 mg weekly   A1c is higher now at 8.2 and is progressively increasing  Current blood sugar patterns and problems identified:  She has increased her LANTUS dose significantly since her last visit  Lately has had more stress and not exercising  She is also inconsistent with her diet and not controlling portions, carbohydrates and sweets lately  Her weight is however staying about the same  She is again checking blood sugars primarily fasting which have also been higher even with her trying to increase her insulin  Only today her blood sugar was down to 120 fasting  She has readings over 200 twice after lunch and once after dinner  Otherwise minimal monitoring  Again cannot afford the freestyle libre system  Ozempic has been continued and this may be helping limit her weight gain   Side effects from medications have been:none  Compliance with the medical regimen: Mostly good Hypoglycemia: none  Glucose monitoring:  done 1-2 times a day         Glucometer: One  Touch      Blood Glucose averages from monitor download  Recent FASTING range 120-217 AVERAGE 175 Nonfasting 202-234   Self-care: The diet that the patient has been following is: tries to limit fats.     Meal times are: Dinner: Usually at 7 pm      Dietician visit, most recent: 12/2015               Exercise:  not walking recently starting to start back  Weight history:   Wt Readings from Last 3 Encounters:  05/17/18 172 lb 3.2 oz (78.1 kg)  02/14/18 173 lb (78.5 kg)  11/16/17 174 lb (78.9 kg)    Glycemic control:   Lab Results  Component Value Date   HGBA1C 8.2 (H) 05/14/2018   HGBA1C 7.8 (H) 02/11/2018   HGBA1C 7.4 (H) 11/08/2017   Lab Results  Component Value Date   MICROALBUR 0.9 11/08/2017   LDLCALC 90 02/11/2018   CREATININE 0.60 05/14/2018   Lab on 05/14/2018  Component Date Value Ref Range Status  . TSH 05/14/2018 4.12  0.35 - 4.50 uIU/mL Final  . Sodium 05/14/2018 138  135 - 145 mEq/L Final  . Potassium 05/14/2018 3.9  3.5 - 5.1 mEq/L Final  . Chloride 05/14/2018 102  96 - 112 mEq/L Final  . CO2 05/14/2018 27  19 - 32 mEq/L Final  . Glucose, Bld 05/14/2018 116* 70 -  99 mg/dL Final  . BUN 16/09/9603 17  6 - 23 mg/dL Final  . Creatinine, Ser 05/14/2018 0.60  0.40 - 1.20 mg/dL Final  . Calcium 54/08/8118 9.8  8.4 - 10.5 mg/dL Final  . GFR 14/78/2956 107.82  >60.00 mL/min Final  . Hgb A1c MFr Bld 05/14/2018 8.2* 4.6 - 6.5 % Final   Glycemic Control Guidelines for People with Diabetes:Non Diabetic:  <6%Goal of Therapy: <7%Additional Action Suggested:  >8%     Other active problems: See review of systems   Allergies as of 05/17/2018      Reactions   Penicillins Anaphylaxis, Rash   Has patient had a PCN reaction causing immediate rash, facial/tongue/throat swelling, SOB or lightheadedness with hypotension: Yes Has patient had a PCN reaction causing severe rash involving mucus membranes or skin necrosis: No Has patient had a PCN reaction that required  hospitalization Yes Has patient had a PCN reaction occurring within the last 10 years: No If all of the above answers are "NO", then may proceed with Cephalosporin use. Has patient had a PCN reaction causing immediate rash, facial/tongue/throat swelling, SOB or lightheadedness with hypotension: Yes Has patient had a PCN reaction causing severe rash involving mucus membranes or skin necrosis: No Has patient had a PCN reaction that required hospitalization Yes Has patient had a PCN reaction occurring within the last 10 years: No If all of the above answers are "NO", then may proceed with Cephalosporin use.   Sulfa Antibiotics Diarrhea   Sulfasalazine Diarrhea      Medication List        Accurate as of 05/17/18 11:59 PM. Always use your most recent med list.          albuterol 108 (90 Base) MCG/ACT inhaler Commonly known as:  PROVENTIL HFA;VENTOLIN HFA Inhale into the lungs.   amLODipine 10 MG tablet Commonly known as:  NORVASC Take 10 mg by mouth daily.   canagliflozin 300 MG Tabs tablet Commonly known as:  INVOKANA TAKE 1 TABLET (300 MG TOTAL) BY MOUTH DAILY BEFORE BREAKFAST.   desvenlafaxine 50 MG 24 hr tablet Commonly known as:  PRISTIQ   FREESTYLE LIBRE SENSOR SYSTEM Misc CHANGE EVERY 10 DAYS AS DIRECTED   glucose blood test strip Commonly known as:  ONETOUCH VERIO Use as instructed to check blood sugars 2-3 times daily.   ibuprofen 600 MG tablet Commonly known as:  ADVIL,MOTRIN Take 1 tablet (600 mg total) by mouth every 6 (six) hours as needed.   Insulin Degludec 200 UNIT/ML Sopn Commonly known as:  TRESIBA FLEXTOUCH Inject 68 Units into the skin daily.   Insulin Pen Needle 31G X 5 MM Misc Use to inject insulin once daily   levothyroxine 25 MCG tablet Commonly known as:  SYNTHROID, LEVOTHROID TAKE 1 TABLET BY MOUTH ONCE A DAY BEFORE BREAKFAST   losartan 100 MG tablet Commonly known as:  COZAAR Take 100 mg by mouth daily.   metFORMIN 500 MG 24 hr  tablet Commonly known as:  GLUCOPHAGE-XR TAKE TWO TABLETS BY MOUTH TWICE DAILY   OZEMPIC 1 MG/DOSE Sopn Generic drug:  Semaglutide Inject 1mg  into the skin once a week   PREMARIN vaginal cream Generic drug:  conjugated estrogens   simvastatin 20 MG tablet Commonly known as:  ZOCOR Take 20 mg by mouth daily.       Allergies:  Allergies  Allergen Reactions  . Penicillins Anaphylaxis and Rash    Has patient had a PCN reaction causing immediate rash, facial/tongue/throat swelling, SOB or lightheadedness  with hypotension: Yes Has patient had a PCN reaction causing severe rash involving mucus membranes or skin necrosis: No Has patient had a PCN reaction that required hospitalization Yes Has patient had a PCN reaction occurring within the last 10 years: No If all of the above answers are "NO", then may proceed with Cephalosporin use. Has patient had a PCN reaction causing immediate rash, facial/tongue/throat swelling, SOB or lightheadedness with hypotension: Yes Has patient had a PCN reaction causing severe rash involving mucus membranes or skin necrosis: No Has patient had a PCN reaction that required hospitalization Yes Has patient had a PCN reaction occurring within the last 10 years: No If all of the above answers are "NO", then may proceed with Cephalosporin use.   . Sulfa Antibiotics Diarrhea  . Sulfasalazine Diarrhea    Past Medical History:  Diagnosis Date  . Asthma   . Diabetes mellitus   . Hyperlipidemia   . Hypertension   . Thyroid disease    Small goiter, stable  . Vitamin D deficiency     Past Surgical History:  Procedure Laterality Date  . TONSILLECTOMY      Family History  Problem Relation Age of Onset  . Diabetes Mother   . Diabetes Father   . Diabetes Maternal Aunt     Social History:  reports that she has never smoked. She has never used smokeless tobacco. She reports that she does not drink alcohol. Her drug history is not on file.    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 Thyroid biopsy was negative in 6/17 for a 1.9 cm isthmus  Nodule  HYPOTHYROIDISM: On her visit in September2017 she was complaining about fatigue and some cold intolerance and her TSH was high at 5.5 She is since then empirically trying 25 g of levothyroxine and this had improved energy level  TSH is consistently normal   Lab Results  Component Value Date   TSH 4.12 05/14/2018   TSH 4.10 02/11/2018   TSH 3.91 08/10/2017   FREET4 0.83 01/18/2017   FREET4 0.80 09/26/2016     She is vitamin D deficient, advised 5000 units vitamin D 3 from PCP    LABS:  Lab on 05/14/2018  Component Date Value Ref Range Status  . TSH 05/14/2018 4.12  0.35 - 4.50 uIU/mL Final  . Sodium 05/14/2018 138  135 - 145 mEq/L Final  . Potassium 05/14/2018 3.9  3.5 - 5.1 mEq/L Final  . Chloride 05/14/2018 102  96 - 112 mEq/L Final  . CO2 05/14/2018 27  19 - 32 mEq/L Final  . Glucose, Bld 05/14/2018 116* 70 - 99 mg/dL Final  . BUN 81/19/1478 17  6 - 23 mg/dL Final  . Creatinine, Ser 05/14/2018 0.60  0.40 - 1.20 mg/dL Final  . Calcium 29/56/2130 9.8  8.4 - 10.5 mg/dL Final  . GFR 86/57/8469 107.82  >60.00 mL/min Final  . Hgb A1c MFr  Bld 05/14/2018 8.2* 4.6 - 6.5 % Final   Glycemic Control Guidelines for People with Diabetes:Non Diabetic:  <6%Goal of Therapy: <7%Additional Action Suggested:  >8%     Physical Examination:  BP 132/74 (BP Location: Left Arm, Patient Position: Sitting, Cuff Size: Normal)   Pulse  74   Ht 5\' 2"  (1.575 m)   Wt 172 lb 3.2 oz (78.1 kg)   SpO2 98%   BMI 31.50 kg/m    ASSESSMENT:  Diabetes type 2, uncontrolled with BMI 33 See history of present illness for detailed discussion of current diabetes management, blood sugar patterns and problems identified  She is currently on basal insulin, Ozempic along with Invokana and metformin  However her A1c continues to go up Not benefiting from Invokana and Ozempic even with taking 1 mg Ozempic now Most of her high readings are probably related to poor diet and lack of exercise as well as stress She is taking relatively large amount of Lantus now However probably has more high postprandial readings which are not being controlled and not clear if this is from progression of her diabetes  HYPERTENSION: Well controlled  Mild hypothyroidism: Well-controlled   PLAN:    She can watch her fasting readings over the next few days and if starting to get low normal she can reduce her Lantus  More consistent diet and avoid high carbohydrate or high-fat meals  She will continue using the freestyle Libre sensor, co-pay card given  Advised  to avoid taking extra Lantus as this is a slow insulin  She'll continue 0.5 mg Ozempic weekly but consider increasing to 1 mg on the next visit, explained to her that she gets 2 mg in one pen  Continue walking regularly   Patient Instructions  Check blood sugars on waking up  4/7 days  Also check blood sugars about 2 hours after a meal and do this after different meals by rotation  Recommended blood sugar levels on waking up is 90-130 and about 2 hours after meal is 130-160  Please bring your blood sugar monitor to each visit, thank you  Call if sugars stay > 200 after meals   Counseling time on subjects discussed above is over 50% of today's 25 minute visit   Reather Littler 05/18/2018, 4:16 PM   Note: This office note was prepared with Dragon voice recognition system technology. Any  transcriptional errors that result from this process are unintentional.  Patient ID: Natasha Chandler, female   DOB: January 01, 1956, 62 y.o.   MRN: [id number]           Reason for Appointment: F/u for Type 2 Diabetes  Referring physician: Cam Hai  History of Present Illness:          Date of diagnosis of type 2 diabetes mellitus: 2004       Background history:   She was treated with oral hypoglycemic drugs like metformin and first few years Insulin was started around 2008-9 but details are not available. She thinks she has taken Lantus for several years, at some point was also taking Humalog. Trulicity was started 1-2 years ago, taking Invokana 3 yrs  Over the last 2-3 years she has been having poor control and has had difficulty focusing on her diabetes because of family issues and periodically not having insurance  Recent history:   INSULIN regimen is: Lantus 68 in am    Non-insulin hypoglycemic drugs the patient is taking are: Metformin ER 1g bid, Invokana 300 mg daily, Victoza  1.8mg    A1c is now relatively better at 7.4, previously as high as 9.8  Current blood sugar patterns and problems identified:  She has only in the last 3-4 days started using the freestyle Libre sensor in addition to her One Touch meter  Again usually on her monitor checking readings fasting and not after meals as much  She has started using the Ozempic about a month ago and did take at least 2 injections of the 0.5 dosage, she is tolerating this well and was able to get this with her co-pay card although with some difficulty  She has not had much change in her blood sugars on her monitor as yet although does not have any consistently high post pen  If she did have a significantly high reading of over 300 yesterday afternoon after eating 2 hot dogs  However she took 10 units Lantus extra yesterday and got hypoglycemic last night but blood sugar down to 69 and she treated it with a lot of food  Her  overnight blood sugars are otherwise on her continuous monitor around 140  Fasting blood sugars on her fingersticks are variable ranging from 77-165  Postprandial readings otherwise have been occasionally around 180-200   Side effects from medications have been:none  Compliance with the medical regimen: Mostly good Hypoglycemia: Only once as above  Glucose monitoring:  done 1-2 times a day         Glucometer: One Touch and freestyle Libre       Blood Glucose readings by download as above:  Median blood sugar on her fingersticks overall 112  Self-care: The diet that the patient has been following is: tries to limit fats.     Meal times are: Dinner: 7 pm   Typical meal intake: Breakfast is cereal or eggs, sometimes higher fat meals           Dietician visit, most recent: 4 years ago               Exercise: walking more recently  Weight history: 162-187  Wt Readings from Last 3 Encounters:  05/17/18 172 lb 3.2 oz (78.1 kg)  02/14/18 173 lb (78.5 kg)  11/16/17 174 lb (78.9 kg)    Glycemic control:   Lab Results  Component Value Date   HGBA1C 8.2 (H) 05/14/2018   HGBA1C 7.8 (H) 02/11/2018   HGBA1C 7.4 (H) 11/08/2017   Lab Results  Component Value Date   MICROALBUR 0.9 11/08/2017   LDLCALC 90 02/11/2018   CREATININE 0.60 05/14/2018   Lab on 05/14/2018  Component Date Value Ref Range Status  . TSH 05/14/2018 4.12  0.35 - 4.50 uIU/mL Final  . Sodium 05/14/2018 138  135 - 145 mEq/L Final  . Potassium 05/14/2018 3.9  3.5 - 5.1 mEq/L Final  . Chloride 05/14/2018 102  96 - 112 mEq/L Final  . CO2 05/14/2018 27  19 - 32 mEq/L Final  . Glucose, Bld 05/14/2018 116* 70 - 99 mg/dL Final  . BUN 82/95/621306/10/2018 17  6 - 23 mg/dL Final  . Creatinine, Ser 05/14/2018 0.60  0.40 - 1.20 mg/dL Final  . Calcium 08/65/784606/10/2018 9.8  8.4 - 10.5 mg/dL Final  . GFR 96/29/528406/10/2018 107.82  >60.00 mL/min Final  . Hgb A1c MFr Bld 05/14/2018 8.2* 4.6 - 6.5 % Final   Glycemic Control Guidelines for People with  Diabetes:Non Diabetic:  <6%Goal of Therapy: <7%Additional Action Suggested:  >8%     Other active problems: See review of systems  Allergies as of 05/17/2018      Reactions   Penicillins Anaphylaxis, Rash   Has patient had a PCN reaction causing immediate rash, facial/tongue/throat swelling, SOB or lightheadedness with hypotension: Yes Has patient had a PCN reaction causing severe rash involving mucus membranes or skin necrosis: No Has patient had a PCN reaction that required hospitalization Yes Has patient had a PCN reaction occurring within the last 10 years: No If all of the above answers are "NO", then may proceed with Cephalosporin use. Has patient had a PCN reaction causing immediate rash, facial/tongue/throat swelling, SOB or lightheadedness with hypotension: Yes Has patient had a PCN reaction causing severe rash involving mucus membranes or skin necrosis: No Has patient had a PCN reaction that required hospitalization Yes Has patient had a PCN reaction occurring within the last 10 years: No If all of the above answers are "NO", then may proceed with Cephalosporin use.   Sulfa Antibiotics Diarrhea   Sulfasalazine Diarrhea      Medication List        Accurate as of 05/17/18 11:59 PM. Always use your most recent med list.          albuterol 108 (90 Base) MCG/ACT inhaler Commonly known as:  PROVENTIL HFA;VENTOLIN HFA Inhale into the lungs.   amLODipine 10 MG tablet Commonly known as:  NORVASC Take 10 mg by mouth daily.   canagliflozin 300 MG Tabs tablet Commonly known as:  INVOKANA TAKE 1 TABLET (300 MG TOTAL) BY MOUTH DAILY BEFORE BREAKFAST.   desvenlafaxine 50 MG 24 hr tablet Commonly known as:  PRISTIQ   FREESTYLE LIBRE SENSOR SYSTEM Misc CHANGE EVERY 10 DAYS AS DIRECTED   glucose blood test strip Commonly known as:  ONETOUCH VERIO Use as instructed to check blood sugars 2-3 times daily.   ibuprofen 600 MG tablet Commonly known as:  ADVIL,MOTRIN Take 1  tablet (600 mg total) by mouth every 6 (six) hours as needed.   Insulin Degludec 200 UNIT/ML Sopn Commonly known as:  TRESIBA FLEXTOUCH Inject 68 Units into the skin daily.   Insulin Pen Needle 31G X 5 MM Misc Use to inject insulin once daily   levothyroxine 25 MCG tablet Commonly known as:  SYNTHROID, LEVOTHROID TAKE 1 TABLET BY MOUTH ONCE A DAY BEFORE BREAKFAST   losartan 100 MG tablet Commonly known as:  COZAAR Take 100 mg by mouth daily.   metFORMIN 500 MG 24 hr tablet Commonly known as:  GLUCOPHAGE-XR TAKE TWO TABLETS BY MOUTH TWICE DAILY   OZEMPIC 1 MG/DOSE Sopn Generic drug:  Semaglutide Inject 1mg  into the skin once a week   PREMARIN vaginal cream Generic drug:  conjugated estrogens   simvastatin 20 MG tablet Commonly known as:  ZOCOR Take 20 mg by mouth daily.       Allergies:  Allergies  Allergen Reactions  . Penicillins Anaphylaxis and Rash    Has patient had a PCN reaction causing immediate rash, facial/tongue/throat swelling, SOB or lightheadedness with hypotension: Yes Has patient had a PCN reaction causing severe rash involving mucus membranes or skin necrosis: No Has patient had a PCN reaction that required hospitalization Yes Has patient had a PCN reaction occurring within the last 10 years: No If all of the above answers are "NO", then may proceed with Cephalosporin use. Has patient had a PCN reaction causing immediate rash, facial/tongue/throat swelling, SOB or lightheadedness with hypotension: Yes Has patient had a PCN reaction causing severe rash involving mucus membranes or skin necrosis: No Has patient had  a PCN reaction that required hospitalization Yes Has patient had a PCN reaction occurring within the last 10 years: No If all of the above answers are "NO", then may proceed with Cephalosporin use.   . Sulfa Antibiotics Diarrhea  . Sulfasalazine Diarrhea    Past Medical History:  Diagnosis Date  . Asthma   . Diabetes mellitus   .  Hyperlipidemia   . Hypertension   . Thyroid disease    Small goiter, stable  . Vitamin D deficiency     Past Surgical History:  Procedure Laterality Date  . TONSILLECTOMY      Family History  Problem Relation Age of Onset  . Diabetes Mother   . Diabetes Father   . Diabetes Maternal Aunt     Social History:  reports that she has never smoked. She has never used smokeless tobacco. She reports that she does not drink alcohol. Her drug history is not on file.    Review of Systems    Lipid history: Has low HDL and borderline LDL, on simvastatin 20 mg from PCP    Lab Results  Component Value Date   CHOL 159 02/11/2018   HDL 34.40 (L) 02/11/2018   LDLCALC 90 02/11/2018   LDLDIRECT 104.0 11/08/2017   TRIG 176.0 (H) 02/11/2018   CHOLHDL 5 02/11/2018           Hypertension: Treated with amlodipine and losartan 100 mg, adequately controlled   Most recent foot exam: 8/18 from PCP   Thyroid: She has had a Nodular goiter since at least 2013 Thyroid biopsy was negative in 6/17 for a 1.9 cm  thmus  Nodule  HYPOTHYROIDISM: On her visit in September 2017 she was complaining about fatigue and some cold intolerance and her TSH was high at 5.5 With 25 g of levothyroxine she has had less fatigue and TSH has been in the normal range although still upper normal She says she does have fatigue but this is mostly from her stress and difficulty sleeping    TSH is normal as follows    Lab Results  Component Value Date   TSH 4.12 05/14/2018   TSH 4.10 02/11/2018   TSH 3.91 08/10/2017   FREET4 0.83 01/18/2017   FREET4 0.80 09/26/2016     She is vitamin D deficient, advised 5000 units vitamin D 3 from PCP   Review of Systems   LABS:  Lab on 05/14/2018  Component Date Value Ref Range Status  . TSH 05/14/2018 4.12  0.35 - 4.50 uIU/mL Final  . Sodium 05/14/2018 138  135 - 145 mEq/L Final  . Potassium 05/14/2018 3.9  3.5 - 5.1 mEq/L Final  . Chloride 05/14/2018 102  96 - 112  mEq/L Final  . CO2 05/14/2018 27  19 - 32 mEq/L Final  . Glucose, Bld 05/14/2018 116* 70 - 99 mg/dL Final  . BUN 16/09/9603 17  6 - 23 mg/dL Final  . Creatinine, Ser 05/14/2018 0.60  0.40 - 1.20 mg/dL Final  . Calcium 54/08/8118 9.8  8.4 - 10.5 mg/dL Final  . GFR 14/78/2956 107.82  >60.00 mL/min Final  . Hgb A1c MFr Bld 05/14/2018 8.2* 4.6 - 6.5 % Final   Glycemic Control Guidelines for People with Diabetes:Non Diabetic:  <6%Goal of Therapy: <7%Additional Action Suggested:  >8%     Physical Examination:  BP 132/74 (BP Location: Left Arm, Patient Position: Sitting, Cuff Size: Normal)   Pulse 74   Ht 5\' 2"  (1.575 m)   Wt 172 lb 3.2  oz (78.1 kg)   SpO2 98%   BMI 31.50 kg/m    ASSESSMENT:  Diabetes type 2, uncontrolled with BMI 33 See history of present illness for detailed discussion of current diabetes management, blood sugar patterns and problems identified  She is currently on basal insulin, Ozempic along with Invokana and metformin However her A1c is progressively higher and most of her will control is from her not watching her diet She does not seem to have responded to Ozempic at this time compared to previous treatment and this may be related to progression of her diabetes She is also not exercising   Blood sugar monitoring also has been inadequate   HYPERTENSION: Controlled  She is overdue for eye exam   HYPOTHYROIDISM: Mild and TSH is stable in the upper normal range, will continue the same dose for now  PLAN:    She will need to start watching her diet consistently  She will also start walking for exercise  Trial of TRESIBA to see if it will help her with more consistent control over 24 hours and less variability  For now she can continue 62 units of this but only increase if fasting readings are consistently high  Discussed differences between this and Lantus and to continue taking this in the morning  Continue Ozempic  If she is able to afford  freestyle Josephine Igo she can start this also  If she has high readings after meals consistently despite improving her diet she will need to consider mealtime insulin and this was discussed, this would be at least started with her main meal once a day and explained to her the timing of injection, titration to keep blood sugars below 180 after meals  Recheck A1c in the next visit, discussed need to get it down below 7  Patient Instructions  Check blood sugars on waking up  4/7 days  Also check blood sugars about 2 hours after a meal and do this after different meals by rotation  Recommended blood sugar levels on waking up is 90-130 and about 2 hours after meal is 130-160  Please bring your blood sugar monitor to each visit, thank you  Call if sugars stay > 200 after meals    Counseling time on subjects discussed in assessment and plan sections is over 50% of today's 25 minute visit   Reather Littler 05/18/2018, 4:16 PM   Note: This office note was prepared with Dragon voice recognition system technology. Any transcriptional errors that result from this process are unintentional.

## 2018-06-28 ENCOUNTER — Other Ambulatory Visit: Payer: Self-pay | Admitting: Endocrinology

## 2018-06-29 ENCOUNTER — Other Ambulatory Visit: Payer: Self-pay | Admitting: Endocrinology

## 2018-07-09 IMAGING — CR DG CHEST 2V
2 series · 2 of 2 positions shown · non-contrast
Comparison: 09/23/2008

CLINICAL DATA: Cough for 5 weeks

EXAM:
CHEST  2 VIEW

[w chest pa]
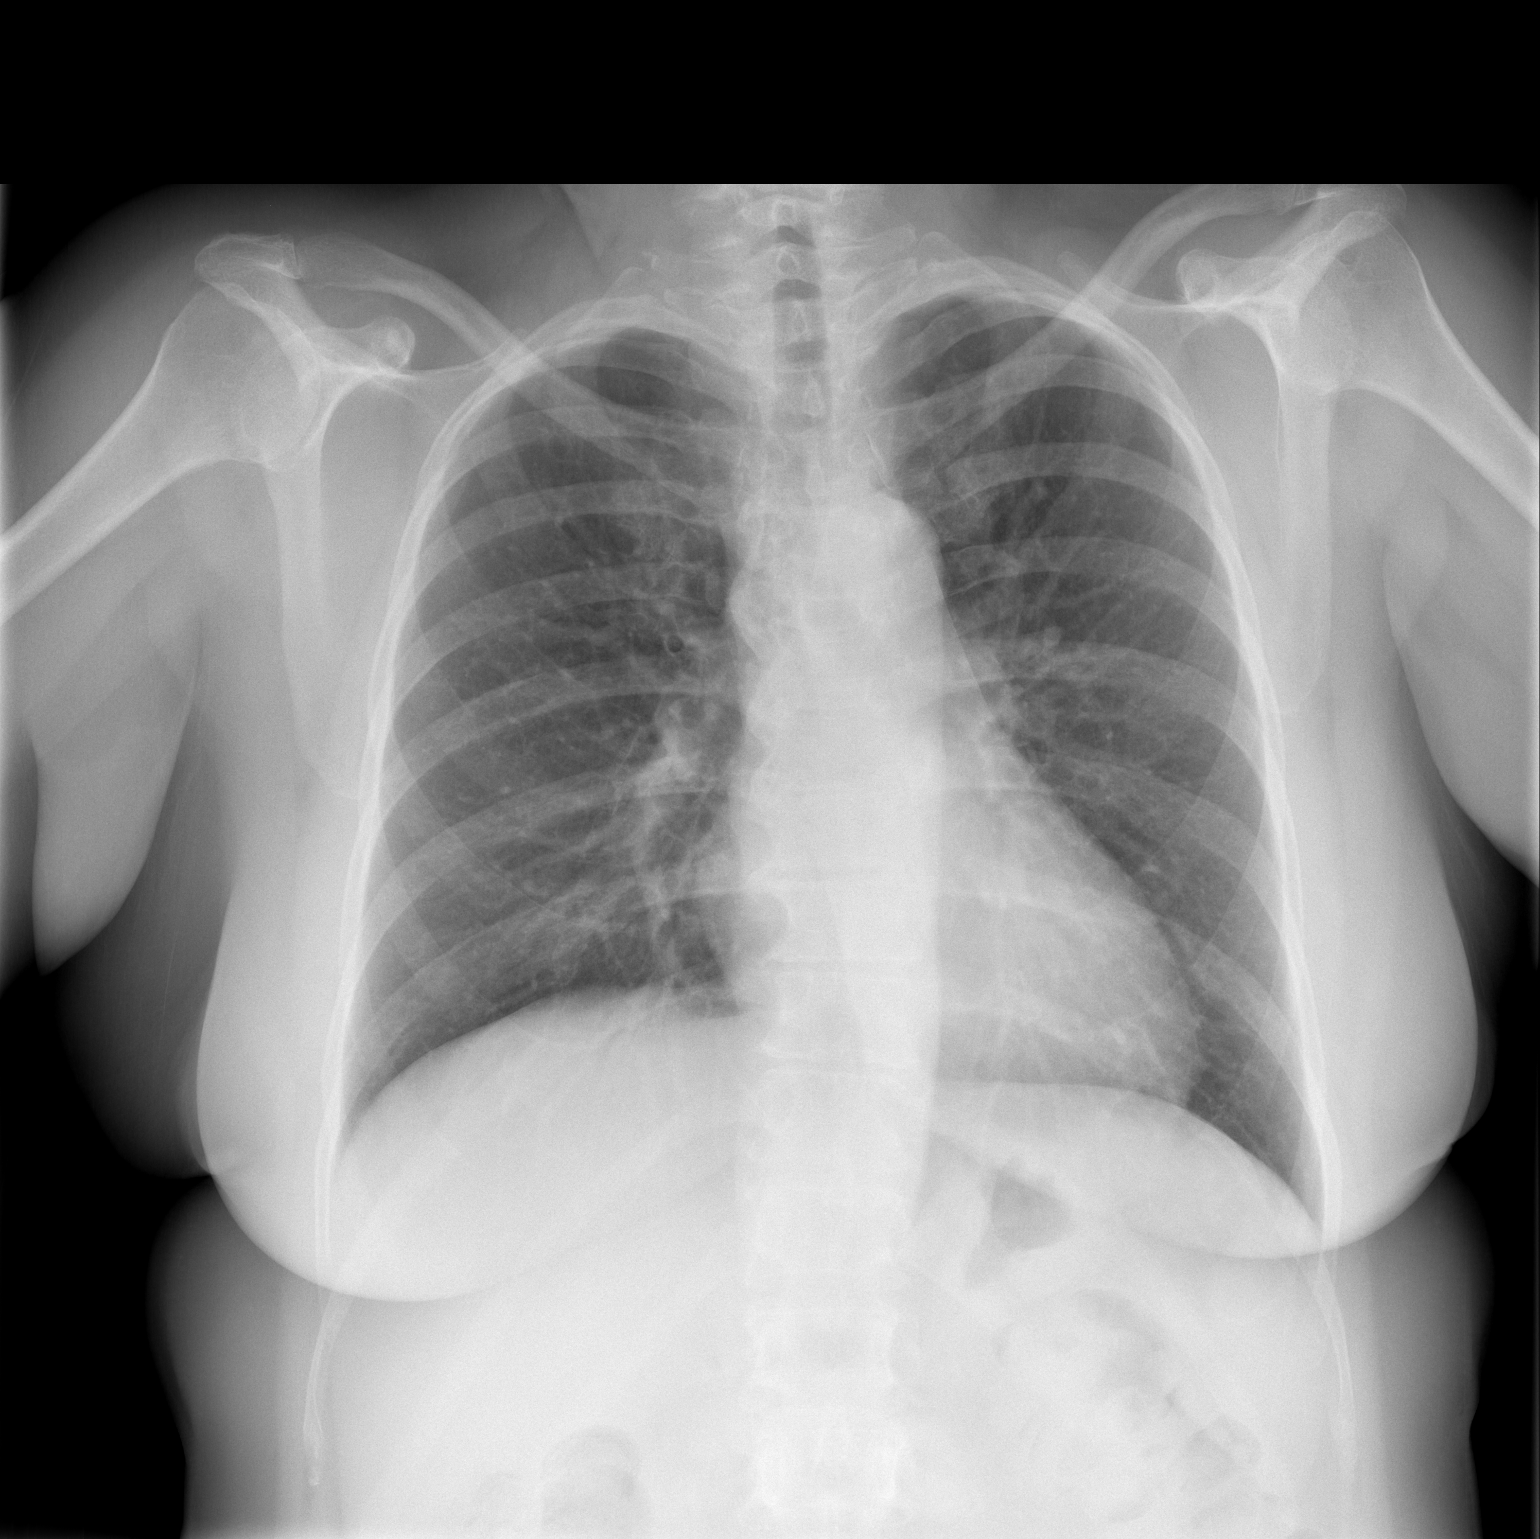

[w chest lat]
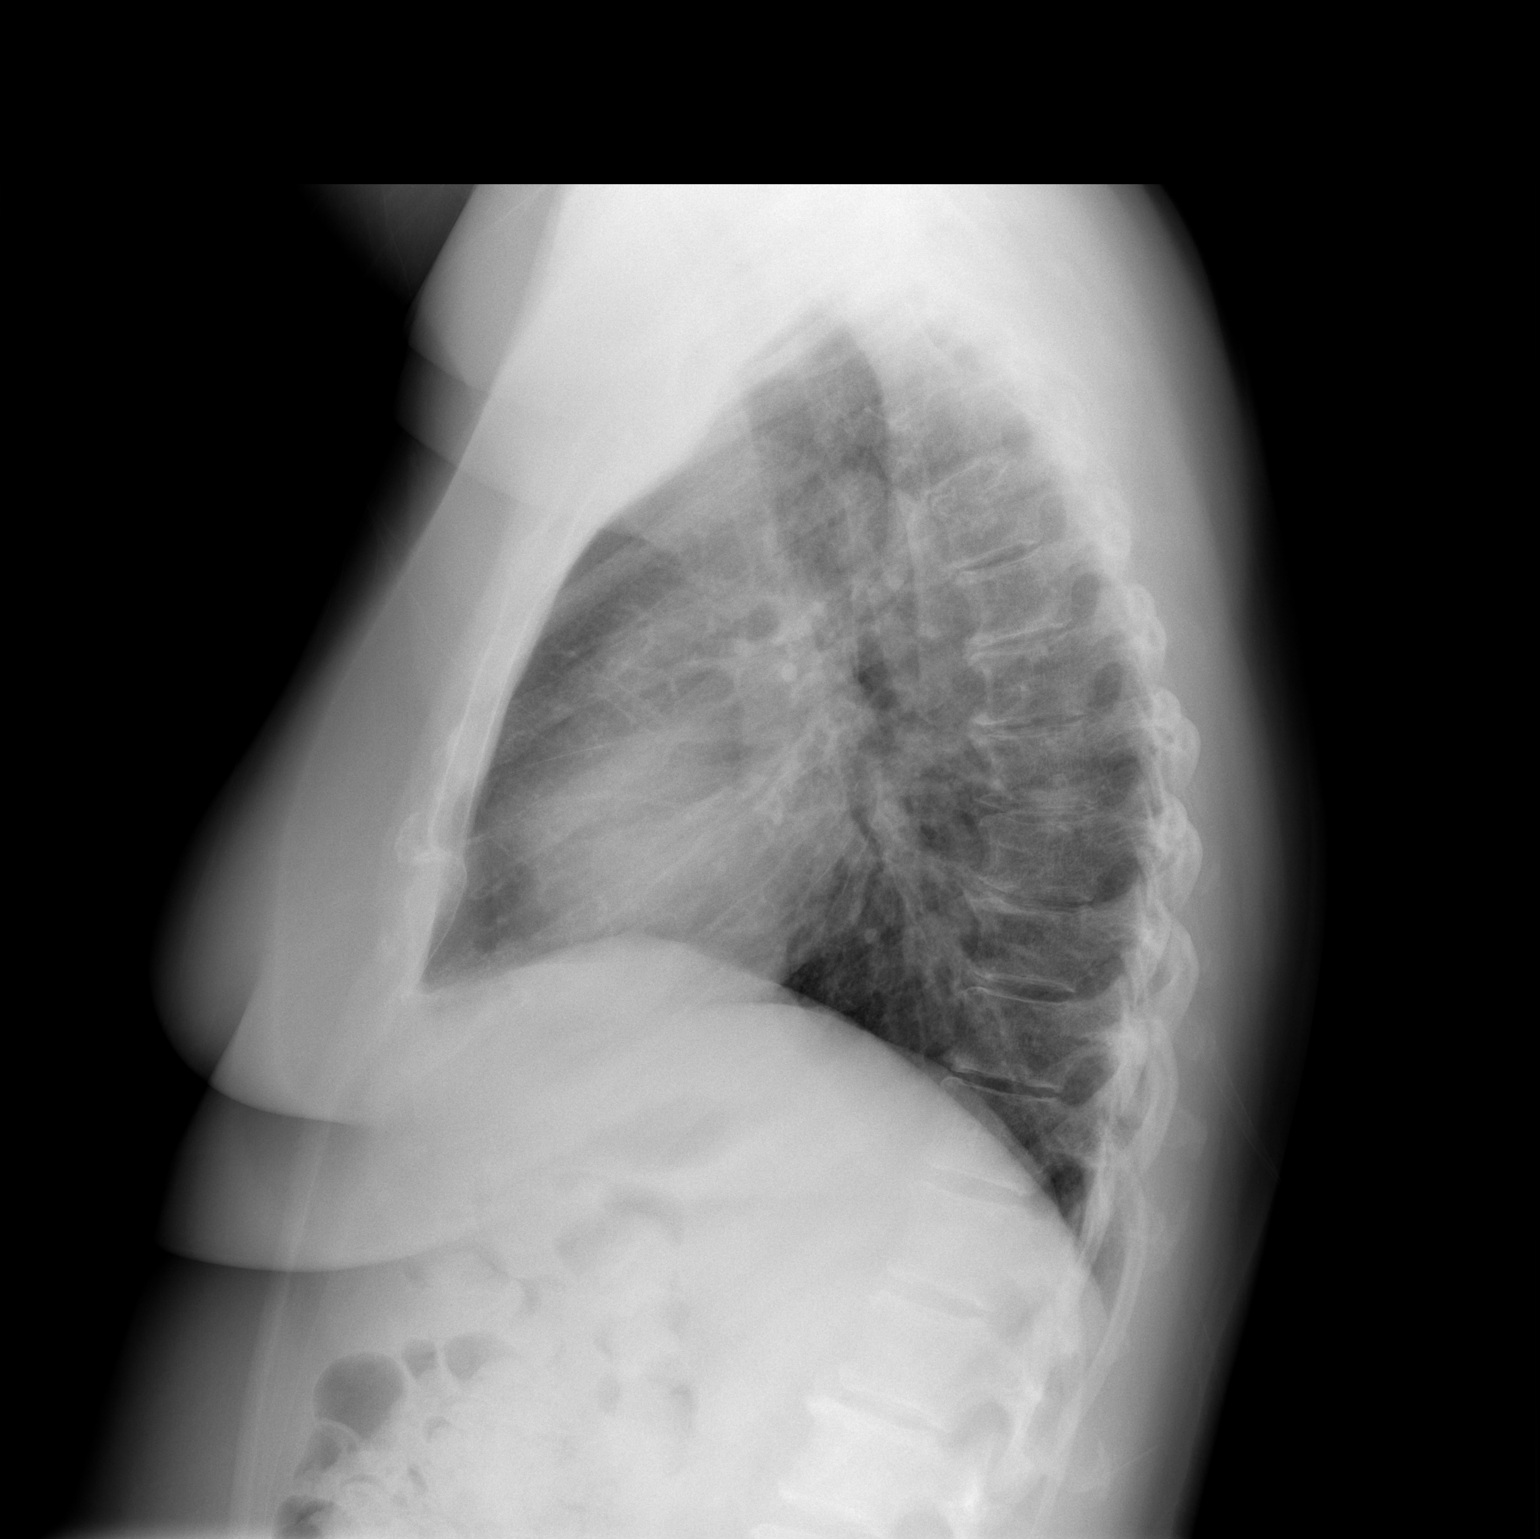

[2 of 2 positions shown; findings below may reference images not displayed]

FINDINGS: Heart and mediastinal contours are within normal limits. No focal
opacities or effusions. No acute bony abnormality.
IMPRESSION: No active cardiopulmonary disease.

## 2018-07-23 ENCOUNTER — Other Ambulatory Visit: Payer: Self-pay | Admitting: Endocrinology

## 2018-07-25 ENCOUNTER — Other Ambulatory Visit: Payer: Self-pay | Admitting: Endocrinology

## 2018-08-01 ENCOUNTER — Other Ambulatory Visit: Payer: Self-pay | Admitting: Family Medicine

## 2018-08-01 DIAGNOSIS — Z1231 Encounter for screening mammogram for malignant neoplasm of breast: Secondary | ICD-10-CM

## 2018-08-03 ENCOUNTER — Encounter (HOSPITAL_BASED_OUTPATIENT_CLINIC_OR_DEPARTMENT_OTHER): Payer: Self-pay | Admitting: Emergency Medicine

## 2018-08-03 ENCOUNTER — Emergency Department (HOSPITAL_BASED_OUTPATIENT_CLINIC_OR_DEPARTMENT_OTHER)
Admission: EM | Admit: 2018-08-03 | Discharge: 2018-08-03 | Disposition: A | Discharge status: Home / Self Care | Payer: BLUE CROSS/BLUE SHIELD | Attending: Emergency Medicine | Admitting: Emergency Medicine

## 2018-08-03 ENCOUNTER — Other Ambulatory Visit: Payer: Self-pay

## 2018-08-03 DIAGNOSIS — I10 Essential (primary) hypertension: Secondary | ICD-10-CM | POA: Insufficient documentation

## 2018-08-03 DIAGNOSIS — E079 Disorder of thyroid, unspecified: Secondary | ICD-10-CM | POA: Insufficient documentation

## 2018-08-03 DIAGNOSIS — Z79899 Other long term (current) drug therapy: Secondary | ICD-10-CM | POA: Insufficient documentation

## 2018-08-03 DIAGNOSIS — J45909 Unspecified asthma, uncomplicated: Secondary | ICD-10-CM | POA: Insufficient documentation

## 2018-08-03 DIAGNOSIS — E119 Type 2 diabetes mellitus without complications: Secondary | ICD-10-CM | POA: Diagnosis not present

## 2018-08-03 DIAGNOSIS — Z794 Long term (current) use of insulin: Secondary | ICD-10-CM | POA: Diagnosis not present

## 2018-08-03 DIAGNOSIS — I158 Other secondary hypertension: Secondary | ICD-10-CM

## 2018-08-03 DIAGNOSIS — R040 Epistaxis: Secondary | ICD-10-CM

## 2018-08-03 MED ORDER — SILVER NITRATE-POT NITRATE 75-25 % EX MISC
CUTANEOUS | Status: AC
  Filled 2018-08-03: qty 2

## 2018-08-03 NOTE — ED Provider Notes (Signed)
[facility] EMERGENCY DEPARTMENT Provider Note  CSN: [id number] Arrival date & time: 08/03/18 0416  Chief Complaint(s) Epistaxis  HPI Natasha Chandler is a 62 y.o. female   The history is provided by the patient.  Epistaxis   This is a recurrent problem. The current episode started 3 to 5 hours ago. Episode frequency: intermittent (was able to stop initial onset, but recurred and has not been able to control it) The problem has not changed since onset.The bleeding has been from the right nare. She has tried applying pressure and a nasal tampon for the symptoms. Her past medical history is significant for allergies and frequent nosebleeds.   Patient reported that she was taken off her amlodipine last week.  States that her pressures have been stable since however has been under a lot of stress due to some interactions with her neighbors, which raises her blood pressure.   Past Medical History Past Medical History:  Diagnosis Date  . Asthma   . Diabetes mellitus   . Hyperlipidemia   . Hypertension   . Thyroid disease    Small goiter, stable  . Vitamin D deficiency    Patient Active Problem List   Diagnosis Date Noted  . Pain in left hip 05/23/2017  . Primary osteoarthritis of left hip 05/23/2017  . 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 Epistaxis 01/30/2017  . Perennial allergic rhinitis 01/30/2017  . Uncontrolled type 2 diabetes mellitus with hyperglycemia, with long-term current use of insulin (HCC) 09/28/2016  . 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 Pure hypercholesterolemia 02/18/2016  . Plantar fasciitis 07/05/2011  . Asthma 07/05/2011  . Multinodular goiter 07/05/2011  . Hypertension 07/05/2011   Home Medication(s) Prior to Admission medications   Medication Sig Start Date End Date Taking? Authorizing Provider  albuterol (PROVENTIL HFA;VENTOLIN HFA) 108 (90 Base) MCG/ACT inhaler Inhale into the lungs. 11/26/16   [provider]    amLODipine (NORVASC) 10 MG tablet Take 10 mg by mouth daily.    [provider]  canagliflozin (INVOKANA) 300 MG TABS tablet TAKE 1 TABLET (300 MG TOTAL) BY MOUTH DAILY BEFORE BREAKFAST. 07/25/17   Reather Littler, MD  Continuous Blood Gluc Sensor (FREESTYLE LIBRE SENSOR SYSTEM) MISC CHANGE EVERY 10 DAYS AS DIRECTED 08/14/17   Reather Littler, MD  desvenlafaxine (PRISTIQ) 50 MG 24 hr tablet  08/13/17   [provider]  glucose blood (ONETOUCH VERIO) test strip Use as instructed to check blood sugars 2-3 times daily. 02/14/18   Reather Littler, MD  ibuprofen (ADVIL,MOTRIN) 600 MG tablet Take 1 tablet (600 mg total) by mouth every 6 (six) hours as needed. Patient taking differently: Take 600 mg by mouth every 6 (six) hours as needed for moderate pain.  11/27/15   Horton, Mayer Masker, MD  Insulin Degludec (TRESIBA FLEXTOUCH) 200 UNIT/ML SOPN Inject 68 Units into the skin daily. 05/17/18   Reather Littler, MD  Insulin Pen Needle 31G X 5 MM MISC Use to inject insulin once daily 01/25/17   Reather Littler, MD  [facility] 300 MG TABS tablet TAKE 1 TABLET BY MOUTH DAILY BEFORE BREAKFAST 07/01/18   Reather Littler, MD  levothyroxine (SYNTHROID, LEVOTHROID) 25 MCG tablet TAKE 1 TABLET BY MOUTH ONCE A DAY BEFORE BREAKFAST 05/06/18   Reather Littler, MD  losartan (COZAAR) 100 MG tablet Take 100 mg by mouth daily.    [provider]  metFORMIN (GLUCOPHAGE-XR) 500 MG 24 hr tablet TAKE TWO TABLETS BY MOUTH TWICE DAILY  07/23/18   Reather Littler, MD  OZEMPIC 1 MG/DOSE SOPN inject 1mg  into the skin once a week 07/25/18   Reather Littler, MD  PREMARIN vaginal cream  08/13/17   [provider]  simvastatin (ZOCOR) 20 MG tablet Take 20 mg by mouth daily.    [provider]                                                                                                                                    Past Surgical History Past Surgical History:  Procedure Laterality Date  . TONSILLECTOMY     Family History Family  History  Problem Relation Age of Onset  . Diabetes Mother   . Diabetes Father   . Diabetes Maternal Aunt     Social History Social History   Tobacco Use  . Smoking status: Never Smoker  . Smokeless tobacco: Never Used  Substance Use Topics  . Alcohol use: Yes    Comment: wine  . Drug use: Never   Allergies Penicillins; Sulfa antibiotics; and Sulfasalazine  Review of Systems Review of Systems  HENT: Positive for nosebleeds.    All other systems are reviewed and are negative for acute change except as noted in the HPI  Physical Exam Vital Signs  I have reviewed the triage vital signs BP (!) 173/81 (BP Location: Right Arm)   Pulse 72   Temp 97.7 F (36.5 C) (Oral)   Resp 18   Ht 5\' 2"  (1.575 m)   Wt 78 kg   SpO2 100%   BMI 31.46 kg/m   Physical Exam  Constitutional: She is oriented to person, place, and time. She appears well-developed and well-nourished. No distress.  HENT:  Head: Normocephalic and atraumatic.  Right Ear: External ear normal.  Left Ear: External ear normal.  Nose: Nose normal.    Eyes: Conjunctivae and EOM are normal. No scleral icterus.  Neck: Normal range of motion and phonation normal.  Cardiovascular: Normal rate and regular rhythm.  Pulmonary/Chest: Effort normal. No stridor. No respiratory distress.  Abdominal: She exhibits no distension.  Musculoskeletal: Normal range of motion. She exhibits no edema.  Neurological: She is alert and oriented to person, place, and time.  Skin: She is not diaphoretic.  Psychiatric: She has a normal mood and affect. Her behavior is normal.  Vitals reviewed.   ED Results and Treatments Labs (all labs ordered are listed, but only abnormal results are displayed) Labs Reviewed - No data to display  EKG  EKG Interpretation  Date/Time:    Ventricular Rate:    PR Interval:    QRS  Duration:   QT Interval:    QTC Calculation:   R Axis:     Text Interpretation:        Radiology No results found. Pertinent labs & imaging results that were available during my care of the patient were reviewed by me and considered in my medical decision making (see chart for details).  Medications Ordered in ED Medications  silver nitrate applicators 75-25 % applicator (has no administration in time range)                                                                                                                                    Procedures Procedures  (including critical care time)  Medical Decision Making / ED Course I have reviewed the nursing notes for this encounter and the patient's prior records (if available in EHR or on provided paperwork).    Right nare epistaxis. Controlled.   HTN. No chest pain, SOB, headache, or focal deficits. Asymptomatic. No work necessary.  Monitored for 45min w/o rebleed . No recurrence of epistaxis. BP improved w/o intervention .  The patient appears reasonably screened and/or stabilized for discharge and I doubt any other medical condition or other [facility] requiring further screening, evaluation, or treatment in the ED at this time prior to discharge.  The patient is safe for discharge with strict return precautions.   Final Clinical Impression(s) / ED Diagnoses Final diagnoses:  Epistaxis  Other secondary hypertension    Disposition: Discharge  Condition: Good  I have discussed the results, Dx and Tx plan with the patient who expressed understanding and agree(s) with the plan. Discharge instructions discussed at great length. The patient was given strict return precautions who verbalized understanding of the instructions. No further questions at time of discharge.    ED Discharge Orders    None       Follow Up: Lupita RaiderShaw, Kimberlee, MD [address] [place] [address] [phone]  Schedule an  appointment as soon as possible for a visit  As needed    This chart was dictated using voice recognition software.  Despite best efforts to proofread,  errors can occur which can change the documentation meaning.   Nira Connardama, Batul Diego Eduardo, MD 08/03/18 [phone]

## 2018-08-03 NOTE — ED Notes (Signed)
ED Provider at bedside. 

## 2018-08-03 NOTE — ED Triage Notes (Signed)
Pt states she has had a nosebleed off and on since 2330 Pt has a tampon in her right nare at this time

## 2018-08-09 ENCOUNTER — Encounter (HOSPITAL_BASED_OUTPATIENT_CLINIC_OR_DEPARTMENT_OTHER): Payer: Self-pay | Admitting: Emergency Medicine

## 2018-08-09 ENCOUNTER — Emergency Department (HOSPITAL_BASED_OUTPATIENT_CLINIC_OR_DEPARTMENT_OTHER)
Admission: EM | Admit: 2018-08-09 | Discharge: 2018-08-09 | Disposition: A | Discharge status: Home / Self Care | Payer: BLUE CROSS/BLUE SHIELD | Attending: Emergency Medicine | Admitting: Emergency Medicine

## 2018-08-09 ENCOUNTER — Other Ambulatory Visit: Payer: Self-pay

## 2018-08-09 DIAGNOSIS — Z794 Long term (current) use of insulin: Secondary | ICD-10-CM | POA: Diagnosis not present

## 2018-08-09 DIAGNOSIS — E785 Hyperlipidemia, unspecified: Secondary | ICD-10-CM | POA: Insufficient documentation

## 2018-08-09 DIAGNOSIS — I10 Essential (primary) hypertension: Secondary | ICD-10-CM | POA: Insufficient documentation

## 2018-08-09 DIAGNOSIS — R040 Epistaxis: Secondary | ICD-10-CM

## 2018-08-09 DIAGNOSIS — E119 Type 2 diabetes mellitus without complications: Secondary | ICD-10-CM | POA: Insufficient documentation

## 2018-08-09 DIAGNOSIS — Z79899 Other long term (current) drug therapy: Secondary | ICD-10-CM | POA: Diagnosis not present

## 2018-08-09 DIAGNOSIS — J45909 Unspecified asthma, uncomplicated: Secondary | ICD-10-CM | POA: Diagnosis not present

## 2018-08-09 MED ORDER — OXYMETAZOLINE HCL 0.05 % NA SOLN
2.0000 | Freq: Once | NASAL | Status: AC
  Administered 2018-08-09: 2 via NASAL
  Filled 2018-08-09: qty 15

## 2018-08-09 NOTE — ED Triage Notes (Addendum)
Reports nose bleed since 0800 this morning.  States that she has a history of nose bleeds and these are typically prolonged so she came to the ER.  Presents with tampon to right nostril.

## 2018-08-09 NOTE — ED Provider Notes (Signed)
[facility] EMERGENCY DEPARTMENT Provider Note   CSN: [id number] Arrival date & time: 08/09/18  0815     History   Chief Complaint Chief Complaint  Patient presents with  . Epistaxis    HPI Natasha Chandler is a 62 y.o. female.  HPI   Evening before last had nose bleed, had one Saturday AM as well   Started back on blood pressure medicine and it has been improved Reports a lot of stress right now at home Does not have ENT. Has had cauterization in the past and is requesting one today. Was able to stop previous with tampon This bleed started right before 8AM, placed some vaseline on the area and thinks she hit a scab and it started bleeding again, placed another tampon, has now stopped    Past Medical History:  Diagnosis Date  . Asthma   . Diabetes mellitus   . Hyperlipidemia   . Hypertension   . Thyroid disease    Small goiter, stable  . Vitamin D deficiency     Patient Active Problem List   Diagnosis Date Noted  . Pain in left hip 05/23/2017  . Primary osteoarthritis of left hip 05/23/2017  . 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 Epistaxis 01/30/2017  . Perennial allergic rhinitis 01/30/2017  . Uncontrolled type 2 diabetes mellitus with hyperglycemia, with long-term current use of insulin (HCC) 09/28/2016  . 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 Pure hypercholesterolemia 02/18/2016  . Plantar fasciitis 07/05/2011  . Asthma 07/05/2011  . Multinodular goiter 07/05/2011  . Hypertension 07/05/2011    Past Surgical History:  Procedure Laterality Date  . TONSILLECTOMY       OB History   None      Home Medications    Prior to Admission medications   Medication Sig Start Date End Date Taking? Authorizing Provider  albuterol (PROVENTIL HFA;VENTOLIN HFA) 108 (90 Base) MCG/ACT inhaler Inhale into the lungs. 11/26/16   [provider]  amLODipine (NORVASC) 10 MG tablet Take 10 mg by mouth daily.    [provider]    canagliflozin (INVOKANA) 300 MG TABS tablet TAKE 1 TABLET (300 MG TOTAL) BY MOUTH DAILY BEFORE BREAKFAST. 07/25/17   Reather Littler, MD  Continuous Blood Gluc Sensor (FREESTYLE LIBRE SENSOR SYSTEM) MISC CHANGE EVERY 10 DAYS AS DIRECTED 08/14/17   Reather Littler, MD  desvenlafaxine (PRISTIQ) 50 MG 24 hr tablet  08/13/17   [provider]  glucose blood (ONETOUCH VERIO) test strip Use as instructed to check blood sugars 2-3 times daily. 02/14/18   Reather Littler, MD  ibuprofen (ADVIL,MOTRIN) 600 MG tablet Take 1 tablet (600 mg total) by mouth every 6 (six) hours as needed. Patient taking differently: Take 600 mg by mouth every 6 (six) hours as needed for moderate pain.  11/27/15   Horton, Mayer Masker, MD  Insulin Degludec (TRESIBA FLEXTOUCH) 200 UNIT/ML SOPN Inject 68 Units into the skin daily. 05/17/18   Reather Littler, MD  Insulin Pen Needle 31G X 5 MM MISC Use to inject insulin once daily 01/25/17   Reather Littler, MD  [facility] 300 MG TABS tablet TAKE 1 TABLET BY MOUTH DAILY BEFORE BREAKFAST 07/01/18   Reather Littler, MD  levothyroxine (SYNTHROID, LEVOTHROID) 25 MCG tablet TAKE 1 TABLET BY MOUTH ONCE A DAY BEFORE BREAKFAST 05/06/18   Reather Littler, MD  losartan (COZAAR) 100 MG tablet Take 100 mg by mouth daily.    [provider]  metFORMIN (GLUCOPHAGE-XR) 500 MG 24 hr tablet TAKE TWO TABLETS  BY MOUTH TWICE DAILY  07/23/18   Reather Littler, MD  OZEMPIC 1 MG/DOSE SOPN inject 1mg  into the skin once a week 07/25/18   Reather Littler, MD  PREMARIN vaginal cream  08/13/17   [provider]  simvastatin (ZOCOR) 20 MG tablet Take 20 mg by mouth daily.    [provider]    Family History Family History  Problem Relation Age of Onset  . Diabetes Mother   . Diabetes Father   . Diabetes Maternal Aunt     Social History Social History   Tobacco Use  . Smoking status: Never Smoker  . Smokeless tobacco: Never Used  Substance Use Topics  . Alcohol use: Yes    Comment: wine  . Drug use: Never      Allergies   Penicillins; Sulfa antibiotics; and Sulfasalazine   Review of Systems Review of Systems  Constitutional: Negative for fever.  HENT: Positive for nosebleeds. Negative for sore throat.   Eyes: Negative for visual disturbance.  Skin: Negative for rash.  Neurological: Negative for syncope.     Physical Exam Updated Vital Signs BP (!) 160/69 (BP Location: Left Arm) Comment: haven't taken BP meds this morning.  Pulse 76   Temp 98.4 F (36.9 C) (Oral)   Resp 18   Ht 5\' 2"  (1.575 m)   Wt 78 kg   SpO2 100%   BMI 31.46 kg/m   Physical Exam  Constitutional: She appears well-developed and well-nourished. No distress.  HENT:  Head: Normocephalic and atraumatic.  Right side nares with sign of previous bleeding, unclear source  Eyes: Conjunctivae are normal.  Neck: Neck supple.  Cardiovascular: Normal rate.  Pulmonary/Chest: Effort normal. No respiratory distress.  Musculoskeletal: She exhibits no edema.  Neurological: She is alert.  Skin: Skin is warm and dry.  Psychiatric: She has a normal mood and affect.  Nursing note and vitals reviewed.    ED Treatments / Results  Labs (all labs ordered are listed, but only abnormal results are displayed) Labs Reviewed - No data to display  EKG None  Radiology No results found.  Procedures Procedures (including critical care time)  Medications Ordered in ED Medications  oxymetazoline (AFRIN) 0.05 % nasal spray 2 spray (has no administration in time range)     Initial Impression / Assessment and Plan / ED Course  I have reviewed the triage vital signs and the nursing notes.  Pertinent labs & imaging results that were available during my care of the patient were reviewed by me and considered in my medical decision making (see chart for details).     62yo female presents with concern for epistaxis.  Tampon that was placed by patient was removed without evidence of continued bleeding. Doubt posterior  bleed. Given afrin spray. Patient requesting cauterization however difficult to detect exact source of bleeding at this time and discussed that given bleeding controlled with pressure would recommend continued conservative measures at this time.  Pt watched, ambulated in ED without recurrence of bleeding. Patient discharged in stable condition with understanding of reasons to return.    Final Clinical Impressions(s) / ED Diagnoses   Final diagnoses:  Right-sided epistaxis    ED Discharge Orders    None       Alvira Monday, MD 08/09/18 [phone]

## 2018-08-09 NOTE — ED Notes (Signed)
ED Provider at bedside. 

## 2018-08-14 ENCOUNTER — Ambulatory Visit
Admission: RE | Admit: 2018-08-14 | Discharge: 2018-08-14 | Disposition: A | Discharge status: Home / Self Care | Payer: BLUE CROSS/BLUE SHIELD | Source: Ambulatory Visit | Attending: Physician Assistant | Admitting: Physician Assistant

## 2018-08-14 ENCOUNTER — Other Ambulatory Visit: Payer: Self-pay | Admitting: Physician Assistant

## 2018-08-14 DIAGNOSIS — R52 Pain, unspecified: Secondary | ICD-10-CM

## 2018-08-14 DIAGNOSIS — M7989 Other specified soft tissue disorders: Secondary | ICD-10-CM

## 2018-08-20 ENCOUNTER — Other Ambulatory Visit (INDEPENDENT_AMBULATORY_CARE_PROVIDER_SITE_OTHER): Payer: BLUE CROSS/BLUE SHIELD

## 2018-08-20 DIAGNOSIS — E1165 Type 2 diabetes mellitus with hyperglycemia: Secondary | ICD-10-CM

## 2018-08-20 DIAGNOSIS — Z794 Long term (current) use of insulin: Secondary | ICD-10-CM | POA: Diagnosis not present

## 2018-08-20 LAB — BASIC METABOLIC PANEL
BUN: 17 mg/dL (ref 6–23)
CHLORIDE: 105 meq/L (ref 96–112)
CO2: 23 meq/L (ref 19–32)
Calcium: 9.5 mg/dL (ref 8.4–10.5)
Creatinine, Ser: 0.58 mg/dL (ref 0.40–1.20)
GFR: 112.02 mL/min (ref >60.00)
Glucose, Bld: 102 mg/dL — ABNORMAL HIGH (ref 70–99)
POTASSIUM: 4 meq/L (ref 3.5–5.1)
SODIUM: 140 meq/L (ref 135–145)

## 2018-08-20 LAB — HEMOGLOBIN A1C: Hgb A1c MFr Bld: 7.8 % — ABNORMAL HIGH (ref 4.6–6.5)

## 2018-08-23 ENCOUNTER — Ambulatory Visit: Payer: BLUE CROSS/BLUE SHIELD | Admitting: Endocrinology

## 2018-08-23 ENCOUNTER — Other Ambulatory Visit: Payer: Self-pay | Admitting: Endocrinology

## 2018-08-23 ENCOUNTER — Telehealth: Payer: Self-pay | Admitting: Endocrinology

## 2018-08-23 ENCOUNTER — Encounter: Payer: Self-pay | Admitting: Endocrinology

## 2018-08-23 VITALS — BP 136/72 | HR 72 | Temp 97.8°F | Ht 62.0 in | Wt 172.0 lb

## 2018-08-23 DIAGNOSIS — I10 Essential (primary) hypertension: Secondary | ICD-10-CM | POA: Diagnosis not present

## 2018-08-23 DIAGNOSIS — Z794 Long term (current) use of insulin: Secondary | ICD-10-CM | POA: Diagnosis not present

## 2018-08-23 DIAGNOSIS — E063 Autoimmune thyroiditis: Secondary | ICD-10-CM

## 2018-08-23 DIAGNOSIS — E1165 Type 2 diabetes mellitus with hyperglycemia: Secondary | ICD-10-CM | POA: Diagnosis not present

## 2018-08-23 MED ORDER — INSULIN ASPART 100 UNIT/ML FLEXPEN: PEN_INJECTOR | SUBCUTANEOUS | 1 refills | Status: DC

## 2018-08-23 NOTE — Telephone Encounter (Signed)
Patient would like a call back to discuss her starting Novolog.  She said Dr Lucianne MussKumar would like her to start Novolog. He gave her coupons and wanted to try filling without her insurance.   Please advise

## 2018-08-23 NOTE — Progress Notes (Signed)
Patient ID: Natasha Chandler, female   DOB: 07-27-1956, 62 y.o.   MRN: [id number]           Reason for Appointment: Follow-up for Type 2 Diabetes  Referring physician: Cam Hai  History of Present Illness:          Date of diagnosis of type 2 diabetes mellitus: 2004       Background history:   She was treated with oral hypoglycemic drugs like metformin and first few years Insulin was started around 2008-9 but details are not available. She thinks she has taken Lantus for several years, at some point was also taking Humalog. Trulicity was started 1-2 years ago, taking Invokana 3 yrs  Over 2-3 years she has been having poor control and has had difficulty focusing on her diabetes because of family issues and periodically not having insurance  Recent history:   INSULIN regimen is: 35 [place] in am    Non-insulin hypoglycemic drugs the patient is taking are: Metformin ER 1g bid, Invokana 300 mg daily, Ozempic 1 mg weekly   A1c is slightly better now at 7.8 compared to 8.2 and was previously progressively increasing  Current blood sugar patterns and problems identified:  She has been switched to [place] instead of Lantus on her last visit  She is needing a little less insulin now and with 60 units her blood sugars can be as low as 90 1 in the morning  However monitoring very sporadically and mostly in the mornings lately  Her blood sugars will tend to be higher if she is not watching her diet the night before  Also she is not motivated to walk consistently watch her diet  Sometimes will have more carbohydrate and sweets as well as pizza periodically  Concerned about the cost of various medications  Also she is asking about how Ozempic works    Side effects from medications have been:none  Compliance with the medical regimen: Mostly good Hypoglycemia: none  Glucose monitoring:  done 1-2 times a day         Glucometer: One Touch      Blood Glucose averages from monitor  download  Recent FASTING AVERAGE about 150, range 91-183 Lunchtime 111 and evening 117, 265   Self-care: The diet that the patient has been following is: tries to limit fats.     Meal times are: Dinner: Usually at 7 pm      Dietician visit, most recent: 12/2015               Exercise:  not walking recently   Weight history:   Wt Readings from Last 3 Encounters:  08/23/18 172 lb (78 kg)  08/09/18 172 lb (78 kg)  08/03/18 172 lb (78 kg)    Glycemic control:   Lab Results  Component Value Date   HGBA1C 7.8 (H) 08/20/2018   HGBA1C 8.2 (H) 05/14/2018   HGBA1C 7.8 (H) 02/11/2018   Lab Results  Component Value Date   MICROALBUR 0.9 11/08/2017   LDLCALC 90 02/11/2018   CREATININE 0.58 08/20/2018   Lab on 08/20/2018  Component Date Value Ref Range Status  . Sodium 08/20/2018 140  135 - 145 mEq/L Final  . Potassium 08/20/2018 4.0  3.5 - 5.1 mEq/L Final  . Chloride 08/20/2018 105  96 - 112 mEq/L Final  . CO2 08/20/2018 23  19 - 32 mEq/L Final  . Glucose, Bld 08/20/2018 102* 70 - 99 mg/dL Final  . BUN 40/98/1191 17  6 -  23 mg/dL Final  . Creatinine, Ser 08/20/2018 0.58  0.40 - 1.20 mg/dL Final  . Calcium 16/09/9603 9.5  8.4 - 10.5 mg/dL Final  . GFR 54/08/8118 112.02  >60.00 mL/min Final  . Hgb A1c MFr Bld 08/20/2018 7.8* 4.6 - 6.5 % Final   Glycemic Control Guidelines for People with Diabetes:Non Diabetic:  <6%Goal of Therapy: <7%Additional Action Suggested:  >8%     Other active problems: See review of systems   Allergies as of 08/23/2018      Reactions   Penicillins Anaphylaxis, Rash   Has patient had a PCN reaction causing immediate rash, facial/tongue/throat swelling, SOB or lightheadedness with hypotension: Yes Has patient had a PCN reaction causing severe rash involving mucus membranes or skin necrosis: No Has patient had a PCN reaction that required hospitalization Yes Has patient had a PCN reaction occurring within the last 10 years: No If all of the above  answers are "NO", then may proceed with Cephalosporin use. Has patient had a PCN reaction causing immediate rash, facial/tongue/throat swelling, SOB or lightheadedness with hypotension: Yes Has patient had a PCN reaction causing severe rash involving mucus membranes or skin necrosis: No Has patient had a PCN reaction that required hospitalization Yes Has patient had a PCN reaction occurring within the last 10 years: No If all of the above answers are "NO", then may proceed with Cephalosporin use.   Sulfa Antibiotics Diarrhea   Sulfasalazine Diarrhea      Medication List        Accurate as of 08/23/18  9:22 AM. Always use your most recent med list.          albuterol 108 (90 Base) MCG/ACT inhaler Commonly known as:  PROVENTIL HFA;VENTOLIN HFA Inhale into the lungs.   amLODipine 10 MG tablet Commonly known as:  NORVASC Take 10 mg by mouth daily.   canagliflozin 300 MG Tabs tablet Commonly known as:  INVOKANA TAKE 1 TABLET (300 MG TOTAL) BY MOUTH DAILY BEFORE BREAKFAST.   INVOKANA 300 MG Tabs tablet Generic drug:  canagliflozin TAKE 1 TABLET BY MOUTH DAILY BEFORE BREAKFAST   desvenlafaxine 50 MG 24 hr tablet Commonly known as:  PRISTIQ   glucose blood test strip Use as instructed to check blood sugars 2-3 times daily.   insulin aspart 100 UNIT/ML FlexPen Commonly known as:  NOVOLOG 12 units ac tid   Insulin Degludec 200 UNIT/ML Sopn Inject 68 Units into the skin daily.   Insulin Pen Needle 31G X 5 MM Misc Use to inject insulin once daily   levothyroxine 25 MCG tablet Commonly known as:  SYNTHROID, LEVOTHROID TAKE 1 TABLET BY MOUTH ONCE A DAY BEFORE BREAKFAST   losartan 100 MG tablet Commonly known as:  COZAAR Take 100 mg by mouth daily.   metFORMIN 500 MG 24 hr tablet Commonly known as:  GLUCOPHAGE-XR TAKE TWO TABLETS BY MOUTH TWICE DAILY   OZEMPIC (1 MG/DOSE) 2 MG/1.5ML Sopn Generic drug:  Semaglutide (1 MG/DOSE) INJECT 1MG  INTO THE SKIN ONCE A WEEK     PREMARIN vaginal cream Generic drug:  conjugated estrogens   simvastatin 20 MG tablet Commonly known as:  ZOCOR Take 20 mg by mouth daily.       Allergies:  Allergies  Allergen Reactions  . Penicillins Anaphylaxis and Rash    Has patient had a PCN reaction causing immediate rash, facial/tongue/throat swelling, SOB or lightheadedness with hypotension: Yes Has patient had a PCN reaction causing severe rash involving mucus membranes or skin necrosis: No Has  patient had a PCN reaction that required hospitalization Yes Has patient had a PCN reaction occurring within the last 10 years: No If all of the above answers are "NO", then may proceed with Cephalosporin use. Has patient had a PCN reaction causing immediate rash, facial/tongue/throat swelling, SOB or lightheadedness with hypotension: Yes Has patient had a PCN reaction causing severe rash involving mucus membranes or skin necrosis: No Has patient had a PCN reaction that required hospitalization Yes Has patient had a PCN reaction occurring within the last 10 years: No If all of the above answers are "NO", then may proceed with Cephalosporin use.   . Sulfa Antibiotics Diarrhea  . Sulfasalazine Diarrhea    Past Medical History:  Diagnosis Date  . Asthma   . Diabetes mellitus   . Hyperlipidemia   . Hypertension   . Thyroid disease    Small goiter, stable  . Vitamin D deficiency     Past Surgical History:  Procedure Laterality Date  . TONSILLECTOMY      Family History  Problem Relation Age of Onset  . Diabetes Mother   . Diabetes Father   . Diabetes Maternal Aunt     Social History:  reports that she has never smoked. She has never used smokeless tobacco. She reports that she drinks alcohol. She reports that she does not use drugs.    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 Thyroid biopsy was negative in 6/17 for a 1.9 cm isthmus  Nodule  HYPOTHYROIDISM: On her visit in September2017 she was complaining about fatigue and some cold intolerance and her TSH was high at 5.5 She is since then empirically trying 25 g of levothyroxine and this had improved energy level  TSH is consistently normal   Lab Results  Component Value Date   TSH 4.12 05/14/2018   TSH 4.10 02/11/2018   TSH 3.91 08/10/2017   FREET4 0.83 01/18/2017   FREET4 0.80 09/26/2016     She is vitamin D deficient, advised 5000 units vitamin D 3 from PCP    LABS:  Lab on 08/20/2018  Component Date Value Ref Range Status  . Sodium 08/20/2018 140  135 - 145 mEq/L Final  . Potassium 08/20/2018 4.0  3.5 - 5.1 mEq/L Final  . Chloride 08/20/2018 105  96 - 112 mEq/L Final  . CO2 08/20/2018 23  19 - 32 mEq/L Final  . Glucose, Bld 08/20/2018 102* 70 - 99 mg/dL Final  . BUN 81/19/1478 17  6 - 23 mg/dL Final  . Creatinine, Ser 08/20/2018 0.58  0.40 - 1.20 mg/dL Final  . Calcium 29/56/2130 9.5  8.4 - 10.5 mg/dL Final  . GFR 86/57/8469 112.02  >60.00 mL/min Final  . Hgb A1c MFr Bld 08/20/2018 7.8* 4.6 - 6.5 % Final   Glycemic Control Guidelines for People with Diabetes:Non Diabetic:  <6%Goal of Therapy: <7%Additional Action Suggested:  >8%     Physical  Examination:  BP 136/72 (BP Location: Left Arm, Patient Position: Sitting, Cuff Size: Large)   Pulse 72   Temp 97.8 F (36.6 C) (Oral)   Ht 5\' 2"  (1.575 m)   Wt 172 lb (78 kg)   BMI 31.46 kg/m    ASSESSMENT:  Diabetes type 2, uncontrolled with BMI 33 See history of present illness for detailed discussion  of current diabetes management, blood sugar patterns and problems identified  She is currently on basal insulin, Ozempic along with Invokana and metformin  However her A1c continues to go up Not benefiting from [place] and Ozempic even with taking 1 mg Ozempic now Most of her high readings are probably related to poor diet and lack of exercise as well as stress She is taking relatively large amount of Lantus now However probably has more high postprandial readings which are not being controlled and not clear if this is from progression of her diabetes  HYPERTENSION: Well controlled  Mild hypothyroidism: Well-controlled   PLAN:    She can watch her fasting readings over the next few days and if starting to get low normal she can reduce her Lantus  More consistent diet and avoid high carbohydrate or high-fat meals  She will continue using the freestyle Libre sensor, co-pay card given  Advised  to avoid taking extra Lantus as this is a slow insulin  She'll continue 0.5 mg Ozempic weekly but consider increasing to 1 mg on the next visit, explained to her that she gets 2 mg in one pen  Continue walking regularly   Patient Instructions  Check blood sugars on waking up  3 /7  Also check blood sugars about 2 hours after a meal and do this after different meals by rotation  Recommended blood sugar levels on waking up is 90-130 and about 2 hours after meal is 130-160  Please bring your blood sugar monitor to each visit, thank you    Counseling time on subjects discussed above is over 50% of today's 25 minute visit   Reather Littler 08/23/2018, 9:22 AM   Note: This office note was prepared with Dragon voice recognition system technology. Any transcriptional errors that result from this process are unintentional.  Patient ID: Natasha Chandler, female   DOB: 11/13/1956, 62 y.o.   MRN: [id number]           Reason for Appointment: F/u for Type 2 Diabetes  Referring physician: Cam Hai  History of Present Illness:          Date of diagnosis of type 2 diabetes mellitus: 2004       Background history:   She was treated with oral hypoglycemic drugs like metformin and first few years Insulin was started around 2008-9 but details are not available. She thinks she has taken Lantus for several years, at some point was also taking Humalog. Trulicity was started 1-2 years ago, taking Invokana 3 yrs  Over the last 2-3 years she has been having poor control and has had difficulty focusing on her diabetes because of family issues and periodically not having insurance  Recent history:   INSULIN regimen is: Lantus 68 in am    Non-insulin hypoglycemic drugs the patient is taking are: Metformin ER 1g bid, Invokana 300 mg daily, Victoza 1.8mg    A1c is now relatively better at 7.4, previously as high as 9.8  Current blood sugar patterns and problems identified:  She has only in the last 3-4 days started using the freestyle [place] sensor  in addition to her One Touch meter  Again usually on her monitor checking readings fasting and not after meals as much  She has started using the Ozempic about a month ago and did take at least 2 injections of the 0.5 dosage, she is tolerating this well and was able to get this with her co-pay card although with some difficulty  She has not had much change in her blood sugars on her monitor as yet although does not have any consistently high post pen  If she did have a significantly high reading of over 300 yesterday afternoon after eating 2 hot dogs  However she took 10 units Lantus extra yesterday and got hypoglycemic last night but blood sugar down to 69 and she treated it with a lot of food  Her overnight blood sugars are otherwise on her continuous monitor around 140  Fasting blood sugars on her fingersticks are variable ranging from 77-165  Postprandial readings otherwise have been occasionally around 180-200   Side effects from  medications have been:none  Compliance with the medical regimen: Mostly good Hypoglycemia: Only once as above  Glucose monitoring:  done 1-2 times a day         Glucometer: One Touch and freestyle Libre       Blood Glucose readings by download as above:  Median blood sugar on her fingersticks overall 112  Self-care: The diet that the patient has been following is: tries to limit fats.     Meal times are: Dinner: 7 pm   Typical meal intake: Breakfast is cereal or eggs, sometimes higher fat meals           Dietician visit, most recent: 4 years ago               Exercise: walking more recently  Weight history: 162-187  Wt Readings from Last 3 Encounters:  08/23/18 172 lb (78 kg)  08/09/18 172 lb (78 kg)  08/03/18 172 lb (78 kg)    Glycemic control:   Lab Results  Component Value Date   HGBA1C 7.8 (H) 08/20/2018   HGBA1C 8.2 (H) 05/14/2018   HGBA1C 7.8 (H) 02/11/2018   Lab Results  Component Value Date   MICROALBUR 0.9 11/08/2017   LDLCALC 90 02/11/2018   CREATININE 0.58 08/20/2018   Lab on 08/20/2018  Component Date Value Ref Range Status  . Sodium 08/20/2018 140  135 - 145 mEq/L Final  . Potassium 08/20/2018 4.0  3.5 - 5.1 mEq/L Final  . Chloride 08/20/2018 105  96 - 112 mEq/L Final  . CO2 08/20/2018 23  19 - 32 mEq/L Final  . Glucose, Bld 08/20/2018 102* 70 - 99 mg/dL Final  . BUN 16/09/9603 17  6 - 23 mg/dL Final  . Creatinine, Ser 08/20/2018 0.58  0.40 - 1.20 mg/dL Final  . Calcium 54/08/8118 9.5  8.4 - 10.5 mg/dL Final  . GFR 14/78/2956 112.02  >60.00 mL/min Final  . Hgb A1c MFr Bld 08/20/2018 7.8* 4.6 - 6.5 % Final   Glycemic Control Guidelines for People with Diabetes:Non Diabetic:  <6%Goal of Therapy: <7%Additional Action Suggested:  >8%     Other active problems: See review of systems   Allergies as of 08/23/2018      Reactions   Penicillins Anaphylaxis, Rash   Has patient had a PCN reaction causing immediate rash, facial/tongue/throat swelling, SOB or  lightheadedness with hypotension: Yes Has patient had a PCN reaction causing severe rash involving mucus membranes or skin necrosis: No  Has patient had a PCN reaction that required hospitalization Yes Has patient had a PCN reaction occurring within the last 10 years: No If all of the above answers are "NO", then may proceed with Cephalosporin use. Has patient had a PCN reaction causing immediate rash, facial/tongue/throat swelling, SOB or lightheadedness with hypotension: Yes Has patient had a PCN reaction causing severe rash involving mucus membranes or skin necrosis: No Has patient had a PCN reaction that required hospitalization Yes Has patient had a PCN reaction occurring within the last 10 years: No If all of the above answers are "NO", then may proceed with Cephalosporin use.   Sulfa Antibiotics Diarrhea   Sulfasalazine Diarrhea      Medication List        Accurate as of 08/23/18  9:22 AM. Always use your most recent med list.          albuterol 108 (90 Base) MCG/ACT inhaler Commonly known as:  PROVENTIL HFA;VENTOLIN HFA Inhale into the lungs.   amLODipine 10 MG tablet Commonly known as:  NORVASC Take 10 mg by mouth daily.   canagliflozin 300 MG Tabs tablet Commonly known as:  INVOKANA TAKE 1 TABLET (300 MG TOTAL) BY MOUTH DAILY BEFORE BREAKFAST.   INVOKANA 300 MG Tabs tablet Generic drug:  canagliflozin TAKE 1 TABLET BY MOUTH DAILY BEFORE BREAKFAST   desvenlafaxine 50 MG 24 hr tablet Commonly known as:  PRISTIQ   glucose blood test strip Use as instructed to check blood sugars 2-3 times daily.   insulin aspart 100 UNIT/ML FlexPen Commonly known as:  NOVOLOG 12 units ac tid   Insulin Degludec 200 UNIT/ML Sopn Inject 68 Units into the skin daily.   Insulin Pen Needle 31G X 5 MM Misc Use to inject insulin once daily   levothyroxine 25 MCG tablet Commonly known as:  SYNTHROID, LEVOTHROID TAKE 1 TABLET BY MOUTH ONCE A DAY BEFORE BREAKFAST   losartan 100 MG  tablet Commonly known as:  COZAAR Take 100 mg by mouth daily.   metFORMIN 500 MG 24 hr tablet Commonly known as:  GLUCOPHAGE-XR TAKE TWO TABLETS BY MOUTH TWICE DAILY   OZEMPIC (1 MG/DOSE) 2 MG/1.5ML Sopn Generic drug:  Semaglutide (1 MG/DOSE) INJECT 1MG  INTO THE SKIN ONCE A WEEK   PREMARIN vaginal cream Generic drug:  conjugated estrogens   simvastatin 20 MG tablet Commonly known as:  ZOCOR Take 20 mg by mouth daily.       Allergies:  Allergies  Allergen Reactions  . Penicillins Anaphylaxis and Rash    Has patient had a PCN reaction causing immediate rash, facial/tongue/throat swelling, SOB or lightheadedness with hypotension: Yes Has patient had a PCN reaction causing severe rash involving mucus membranes or skin necrosis: No Has patient had a PCN reaction that required hospitalization Yes Has patient had a PCN reaction occurring within the last 10 years: No If all of the above answers are "NO", then may proceed with Cephalosporin use. Has patient had a PCN reaction causing immediate rash, facial/tongue/throat swelling, SOB or lightheadedness with hypotension: Yes Has patient had a PCN reaction causing severe rash involving mucus membranes or skin necrosis: No Has patient had a PCN reaction that required hospitalization Yes Has patient had a PCN reaction occurring within the last 10 years: No If all of the above answers are "NO", then may proceed with Cephalosporin use.   . Sulfa Antibiotics Diarrhea  . Sulfasalazine Diarrhea    Past Medical History:  Diagnosis Date  . Asthma   . Diabetes  mellitus   . Hyperlipidemia   . Hypertension   . Thyroid disease    Small goiter, stable  . Vitamin D deficiency     Past Surgical History:  Procedure Laterality Date  . TONSILLECTOMY      Family History  Problem Relation Age of Onset  . Diabetes Mother   . Diabetes Father   . Diabetes Maternal Aunt     Social History:  reports that she has never smoked. She has never  used smokeless tobacco. She reports that she drinks alcohol. She reports that she does not use drugs.    Review of Systems    Lipid history: Has low HDL and borderline LDL, on simvastatin 20 mg from PCP    Lab Results  Component Value Date   CHOL 159 02/11/2018   HDL 34.40 (L) 02/11/2018   LDLCALC 90 02/11/2018   LDLDIRECT 104.0 11/08/2017   TRIG 176.0 (H) 02/11/2018   CHOLHDL 5 02/11/2018           Hypertension: Treated with amlodipine and losartan 100 mg, adequately controlled   Most recent foot exam: 8/18 from PCP   Thyroid: She has had a Nodular goiter since at least 2013 Thyroid biopsy was negative in 6/17 for a 1.9 cm  thmus  Nodule  HYPOTHYROIDISM: On her visit in September 2017 she was complaining about fatigue and some cold intolerance and her TSH was high at 5.5 With 25 g of levothyroxine she has had less fatigue and TSH has been in the normal range although still upper normal  TSH is normal as follows    Lab Results  Component Value Date   TSH 4.12 05/14/2018   TSH 4.10 02/11/2018   TSH 3.91 08/10/2017   FREET4 0.83 01/18/2017   FREET4 0.80 09/26/2016     She is vitamin D deficient, has been advised 5000 units vitamin D 3 from PCP   Review of Systems   LABS:  Lab on 08/20/2018  Component Date Value Ref Range Status  . Sodium 08/20/2018 140  135 - 145 mEq/L Final  . Potassium 08/20/2018 4.0  3.5 - 5.1 mEq/L Final  . Chloride 08/20/2018 105  96 - 112 mEq/L Final  . CO2 08/20/2018 23  19 - 32 mEq/L Final  . Glucose, Bld 08/20/2018 102* 70 - 99 mg/dL Final  . BUN 16/10/960409/17/2019 17  6 - 23 mg/dL Final  . Creatinine, Ser 08/20/2018 0.58  0.40 - 1.20 mg/dL Final  . Calcium 54/09/811909/17/2019 9.5  8.4 - 10.5 mg/dL Final  . GFR 14/78/295609/17/2019 112.02  >60.00 mL/min Final  . Hgb A1c MFr Bld 08/20/2018 7.8* 4.6 - 6.5 % Final   Glycemic Control Guidelines for People with Diabetes:Non Diabetic:  <6%Goal of Therapy: <7%Additional Action Suggested:  >8%     Physical  Examination:  BP 136/72 (BP Location: Left Arm, Patient Position: Sitting, Cuff Size: Large)   Pulse 72   Temp 97.8 F (36.6 C) (Oral)   Ht 5\' 2"  (1.575 m)   Wt 172 lb (78 kg)   BMI 31.46 kg/m    ASSESSMENT:  Diabetes type 2, uncontrolled with BMI 31  See history of present illness for detailed discussion of current diabetes management, blood sugar patterns and problems identified  She is not well controlled on basal insulin, Ozempic along with Invokana and metformin  A1c is 7.8 and has been about the same this year  Most likely because of her postprandial hyperglycemia she is not getting control and still not  able to be consistent with her diet and exercise program Although her weight has not gone up and Ozempic may be helping she goes off her diet and gets too much carbohydrate at times Discussed that she likely has postprandial hyperglycemia at least on the day she is getting more carbohydrate since her fasting readings are not consistently high This is despite taking Ozempic and Invokana as well as metformin   HYPERTENSION: Controlled with current regimen  She is overdue for eye exam and she has difficulty affording co-pays   HYPOTHYROIDISM: Mild and TSH is stable in the upper normal range, will continue the same dose for now  PLAN:    Although she is concerned about the cost of additional medications discussed that we can at least try NovoLog with her main meal  Today discussed in detail the need for mealtime insulin to cover postprandial spikes, action of mealtime insulin, use of the insulin pen, timing and action of the rapid acting insulin as well as starting dose and dosage titration to target the two-hour reading of under 180  For now she can start with 6 units before suppertime and keep an eye on what she is eating; if she is still getting readings at least over 160 she will go up 2 units on the dose  Patient information pamphlet given on this  Co-pay card for  NovoLog and Evaristo Bury given  She will keep all her other medications unchanged  She will need to start watching her diet consistently  She will also start walking consistently for exercise  Follow-up in 2 months  Patient Instructions  Check blood sugars on waking up  3 /7  Also check blood sugars about 2 hours after a meal and do this after different meals by rotation  Recommended blood sugar levels on waking up is 90-130 and about 2 hours after meal is 130-160  Please bring your blood sugar monitor to each visit, thank you    Counseling time on subjects discussed in assessment and plan sections is over 50% of today's 25 minute visit     Reather Littler 08/23/2018, 9:22 AM   Note: This office note was prepared with Dragon voice recognition system technology. Any transcriptional errors that result from this process are unintentional.

## 2018-08-23 NOTE — Patient Instructions (Signed)
Check blood sugars on waking up 3/7   Also check blood sugars about 2 hours after a meal and do this after different meals by rotation  Recommended blood sugar levels on waking up is 90-130 and about 2 hours after meal is 130-160  Please bring your blood sugar monitor to each visit, thank you  

## 2018-08-23 NOTE — Telephone Encounter (Signed)
Pt was just calling to see what pharmacy that the Novolog was sent too. She is aware and she will call back with any issues

## 2018-08-24 ENCOUNTER — Other Ambulatory Visit: Payer: Self-pay | Admitting: Endocrinology

## 2018-08-26 ENCOUNTER — Telehealth: Payer: Self-pay | Admitting: Endocrinology

## 2018-08-26 NOTE — Telephone Encounter (Signed)
Pt called to give the name of the medication Diclofenac sodium 50 mg. Please advise? Thank you!  Call pt @ [phone].

## 2018-08-29 NOTE — Telephone Encounter (Signed)
She may be wanting to add this to her medication list

## 2018-08-29 NOTE — Telephone Encounter (Signed)
added

## 2018-08-29 NOTE — Telephone Encounter (Signed)
Unsure of this call, please advise

## 2018-09-01 ENCOUNTER — Other Ambulatory Visit: Payer: Self-pay | Admitting: Endocrinology

## 2018-09-03 ENCOUNTER — Other Ambulatory Visit: Payer: Self-pay | Admitting: Endocrinology

## 2018-09-06 ENCOUNTER — Ambulatory Visit
Admission: RE | Admit: 2018-09-06 | Discharge: 2018-09-06 | Disposition: A | Discharge status: Home / Self Care | Payer: BLUE CROSS/BLUE SHIELD | Source: Ambulatory Visit | Attending: Family Medicine | Admitting: Family Medicine

## 2018-09-06 DIAGNOSIS — Z1231 Encounter for screening mammogram for malignant neoplasm of breast: Secondary | ICD-10-CM

## 2018-10-02 ENCOUNTER — Emergency Department (HOSPITAL_COMMUNITY)
Admission: EM | Admit: 2018-10-02 | Discharge: 2018-10-02 | Disposition: A | Discharge status: Home / Self Care | Payer: BLUE CROSS/BLUE SHIELD | Attending: Emergency Medicine | Admitting: Emergency Medicine

## 2018-10-02 ENCOUNTER — Other Ambulatory Visit: Payer: Self-pay

## 2018-10-02 ENCOUNTER — Encounter (HOSPITAL_COMMUNITY): Payer: Self-pay | Admitting: Emergency Medicine

## 2018-10-02 DIAGNOSIS — R040 Epistaxis: Secondary | ICD-10-CM | POA: Diagnosis present

## 2018-10-02 DIAGNOSIS — J45909 Unspecified asthma, uncomplicated: Secondary | ICD-10-CM | POA: Diagnosis not present

## 2018-10-02 DIAGNOSIS — Z79899 Other long term (current) drug therapy: Secondary | ICD-10-CM | POA: Diagnosis not present

## 2018-10-02 DIAGNOSIS — I10 Essential (primary) hypertension: Secondary | ICD-10-CM | POA: Diagnosis not present

## 2018-10-02 DIAGNOSIS — Z794 Long term (current) use of insulin: Secondary | ICD-10-CM | POA: Diagnosis not present

## 2018-10-02 DIAGNOSIS — E119 Type 2 diabetes mellitus without complications: Secondary | ICD-10-CM | POA: Insufficient documentation

## 2018-10-02 LAB — BASIC METABOLIC PANEL
Anion gap: 11 (ref 5–15)
BUN: 15 mg/dL (ref 8–23)
CALCIUM: 9.8 mg/dL (ref 8.9–10.3)
CHLORIDE: 102 mmol/L (ref 98–111)
CO2: 23 mmol/L (ref 22–32)
Creatinine, Ser: 0.51 mg/dL (ref 0.44–1.00)
Glucose, Bld: 156 mg/dL — ABNORMAL HIGH (ref 70–99)
Potassium: 4 mmol/L (ref 3.5–5.1)
SODIUM: 136 mmol/L (ref 135–145)

## 2018-10-02 LAB — CBC WITH DIFFERENTIAL/PLATELET
ABS IMMATURE GRANULOCYTES: 0.05 10*3/uL (ref 0.00–0.07)
BASOS ABS: 0 10*3/uL (ref 0.0–0.1)
BASOS PCT: 0 %
Eosinophils Absolute: 0.1 10*3/uL (ref 0.0–0.5)
Eosinophils Relative: 1 %
HCT: 49.1 % — ABNORMAL HIGH (ref 36.0–46.0)
HEMOGLOBIN: 16 g/dL — AB (ref 12.0–15.0)
Immature Granulocytes: 0 %
LYMPHS PCT: 21 %
Lymphs Abs: 2.5 10*3/uL (ref 0.7–4.0)
MCH: 27.3 pg (ref 26.0–34.0)
MCHC: 32.6 g/dL (ref 30.0–36.0)
MCV: 83.8 fL (ref 80.0–100.0)
Monocytes Absolute: 0.9 10*3/uL (ref 0.1–1.0)
Monocytes Relative: 8 %
NEUTROS ABS: 8.3 10*3/uL — AB (ref 1.7–7.7)
NRBC: 0 % (ref 0.0–0.2)
Neutrophils Relative %: 70 %
PLATELETS: 317 10*3/uL (ref 150–400)
RBC: 5.86 MIL/uL — AB (ref 3.87–5.11)
RDW: 12.7 % (ref 11.5–15.5)
WBC: 11.9 10*3/uL — AB (ref 4.0–10.5)

## 2018-10-02 MED ORDER — LIDOCAINE HCL URETHRAL/MUCOSAL 2 % EX GEL
1.0000 "application " | Freq: Once | CUTANEOUS | Status: DC
  Filled 2018-10-02: qty 20

## 2018-10-02 NOTE — ED Triage Notes (Signed)
Pt reports being sent from her drs office for nose bleed that's been ongoing since this morning 0730. Reports that they tried cauterizing in the office without success so they sent her here and Dr. Jearld Fenton is supposed to meet her here.

## 2018-10-02 NOTE — ED Provider Notes (Signed)
Patient placed in Quick Look pathway, seen and evaluated   Chief Complaint: nosebleed  HPI:   Pt reports she went to her MD and was told to come to ED  ROS: no fever, no cough  Physical Exam:   Gen: No distress  Neuro: Awake and Alert  Skin: Warm    Focused Exam: no active bleeding    Initiation of care has begun. The patient has been counseled on the process, plan, and necessity for staying for the completion/evaluation, and the remainder of the medical screening examination   Natasha Chandler 10/02/18 1523    Gerhard Munch, MD 10/03/18 [phone]

## 2018-10-02 NOTE — ED Provider Notes (Signed)
[facility] EMERGENCY DEPARTMENT Provider Note   CSN: [id number] Arrival date & time: 10/02/18  1501     History   Chief Complaint Chief Complaint  Patient presents with  . Epistaxis    HPI Natasha Chandler is a 62 y.o. female.  The history is provided by the patient and medical records. No language interpreter was used.   Natasha Chandler is a 62 y.o. female  with a PMH as listed below who presents to the Emergency Department complaining of intermittent epistaxis since last night.  States that her left nare began bleeding last night.  She used Afrin and then put a tampon of her nose to stop the bleeding.  This was successful, however when she awoke this morning, she sneezed and bleeding began again.  She was able to get it to stop once again with direct pressure, however the next time she sneezed, the nare began bleeding once again.  She went to her primary care office who tried to cauterize the area.  This temporarily helped once again, however the next time she sneezed, bleeding began.  Her doctor reportedly reached out to Dr. Jearld Fenton who was going to see her in the ER.  Currently, she is not experiencing any epistaxis.  She denies any lightheadedness, weakness or syncopal episode.  She is not on any anticoagulation.  She denies any bleeding disorders.  She does have a history of epistaxis in the past and has had to have nare packed before.   Past Medical History:  Diagnosis Date  . Asthma   . Diabetes mellitus   . Hyperlipidemia   . Hypertension   . Thyroid disease    Small goiter, stable  . Vitamin D deficiency     Patient Active Problem List   Diagnosis Date Noted  . Pain in left hip 05/23/2017  . Primary osteoarthritis of left hip 05/23/2017  . 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 Epistaxis 01/30/2017  . Perennial allergic rhinitis 01/30/2017  . Uncontrolled type 2 diabetes mellitus with hyperglycemia, with long-term current use of insulin (HCC) 09/28/2016   . 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 Pure hypercholesterolemia 02/18/2016  . Plantar fasciitis 07/05/2011  . Asthma 07/05/2011  . Multinodular goiter 07/05/2011  . Hypertension 07/05/2011    Past Surgical History:  Procedure Laterality Date  . TONSILLECTOMY       OB History   None      Home Medications    Prior to Admission medications   Medication Sig Start Date End Date Taking? Authorizing Provider  albuterol (PROVENTIL HFA;VENTOLIN HFA) 108 (90 Base) MCG/ACT inhaler Inhale 1-2 puffs into the lungs every 6 (six) hours as needed for wheezing or shortness of breath.  11/26/16  Yes [provider]  amLODipine (NORVASC) 10 MG tablet Take 10 mg by mouth daily.   Yes [provider]  desvenlafaxine (PRISTIQ) 50 MG 24 hr tablet Take 50 mg by mouth daily.  08/13/17  Yes [provider]  diclofenac (VOLTAREN) 50 MG EC tablet Take 50 mg by mouth 2 (two) times daily.   Yes [provider]  glucose blood (ONETOUCH VERIO) test strip Use as instructed to check blood sugars 2-3 times daily. 02/14/18  Yes Reather Littler, MD  insulin aspart (NOVOLOG FLEXPEN) 100 UNIT/ML FlexPen 12 units ac tid Patient taking differently: Inject 12 Units into the skin 3 (three) times daily with meals. 12 units ac tid 08/23/18  Yes Reather Littler, MD  Insulin Pen Needle 31G X  5 MM MISC Use to inject insulin once daily 01/25/17  Yes Reather Littler, MD  INVOKANA 300 MG TABS tablet TAKE ONE TABLET BY MOUTH ONE TIME DAILY BEFORE BREAKFAST Patient taking differently: Take 300 mg by mouth daily before breakfast.  09/02/18  Yes Reather Littler, MD  levothyroxine (SYNTHROID, LEVOTHROID) 25 MCG tablet TAKE ONE TABLET BY MOUTH ONE TIME DAILY BEFORE BREAKFAST Patient taking differently: Take 25 mcg by mouth daily before breakfast. TAKE ONE TABLET BY MOUTH ONE TIME DAILY BEFORE BREAKFAST 09/03/18  Yes Reather Littler, MD  losartan (COZAAR) 100 MG tablet Take 100 mg by mouth daily.   Yes [provider]  metFORMIN (GLUCOPHAGE-XR) 500 MG 24 hr tablet TAKE TWO TABLETS BY MOUTH TWICE DAILY  Patient taking differently: Take 1,000 mg by mouth 2 (two) times daily.  07/23/18  Yes Reather Littler, MD  OZEMPIC, 1 MG/DOSE, 2 MG/1.5ML SOPN INJECT 1MG  INTO THE SKIN ONCE A WEEK Patient taking differently: Inject 1 mg into the skin once a week. Once weekly on thursdays. 08/23/18  Yes Reather Littler, MD  PREMARIN vaginal cream Place 1 Applicatorful vaginally daily.  08/13/17  Yes [provider]  simvastatin (ZOCOR) 20 MG tablet Take 20 mg by mouth daily.   Yes [provider]  TRESIBA FLEXTOUCH 200 UNIT/ML SOPN INJECT 68 UNITS INTO THE SKIN ONCE A DAY Patient taking differently: Inject 68 Units into the skin daily.  08/26/18  Yes Reather Littler, MD    Family History Family History  Problem Relation Age of Onset  . Diabetes Mother   . Diabetes Father   . Diabetes Maternal Aunt   . Breast cancer Cousin     Social History Social History   Tobacco Use  . Smoking status: Never Smoker  . Smokeless tobacco: Never Used  Substance Use Topics  . Alcohol use: Yes    Comment: wine  . Drug use: Never     Allergies   Penicillins; Olive oil; Sulfa antibiotics; and Sulfasalazine   Review of Systems Review of Systems  HENT: Positive for nosebleeds.   All other systems reviewed and are negative.    Physical Exam Updated Vital Signs BP (!) 157/91 (BP Location: Right Arm)   Pulse 88   Temp 98.6 F (37 C) (Oral)   Resp 18   SpO2 99%   Physical Exam  Constitutional: She is oriented to person, place, and time. She appears well-developed and well-nourished. No distress.  HENT:  Head: Normocephalic.  No active bleeding.  Area that was previously cauterized noted to the left nare.  Cardiovascular: Normal rate, regular rhythm and normal heart sounds.  No murmur heard. Pulmonary/Chest: Effort normal and breath sounds normal. No respiratory distress.  Abdominal: Soft. She  exhibits no distension. There is no tenderness.  Musculoskeletal: She exhibits no edema.  Neurological: She is alert and oriented to person, place, and time.  Skin: Skin is warm and dry.  Nursing note and vitals reviewed.    ED Treatments / Results  Labs (all labs ordered are listed, but only abnormal results are displayed) Labs Reviewed  CBC WITH DIFFERENTIAL/PLATELET - Abnormal; Notable for the following components:      Result Value   WBC 11.9 (*)    RBC 5.86 (*)    Hemoglobin 16.0 (*)    HCT 49.1 (*)    Neutro Abs 8.3 (*)    All other components within normal limits  BASIC METABOLIC PANEL - Abnormal; Notable for the following components:   Glucose, Bld  156 (*)    All other components within normal limits    EKG None  Radiology No results found.  Procedures .Epistaxis Management Date/Time: 10/02/2018 6:26 PM Performed by: Kendrik Mcshan, Chase Picket, PA-C Authorized by: Orlander Norwood, Chase Picket, PA-C   Consent:    Consent obtained:  Verbal   Consent given by:  Patient   Risks discussed:  Bleeding, nasal injury and pain Anesthesia (see MAR for exact dosages):    Anesthesia method:  Topical application   Topical anesthetic:  Lidocaine gel Procedure details:    Treatment site:  L anterior   Treatment method:  Nasal balloon   Treatment complexity:  Limited Post-procedure details:    Assessment:  Bleeding stopped   Patient tolerance of procedure:  Tolerated well, no immediate complications   (including critical care time)   Medications Ordered in ED Medications  lidocaine (XYLOCAINE) 2 % jelly 1 application (has no administration in time range)     Initial Impression / Assessment and Plan / ED Course  I have reviewed the triage vital signs and the nursing notes.  Pertinent labs & imaging results that were available during my care of the patient were reviewed by me and considered in my medical decision making (see chart for details).    Natasha Chandler is a 62 y.o.  female who presents to ED from PCP for recurrent epistaxis since yesterday.  Unfortunately, she has been experiencing URI symptoms and every time she sneezes, bleeding will recur.  She had cautery done in the office today, but after sneezing, bleeding returned again.  Currently no active bleeding, although I am concerned that bleeding will return again if she sneezes again at home.  Discussed case with on-call ENT, Dr. Jearld Fenton, who recommends packing.  Nasal packing placed as dictated above.  Patient tolerated well.  ENT follow-up strongly encouraged.  Reasons to return to ER discussed and all questions answered.  Patient seen by and discussed with Dr. Rush Landmark who agrees with treatment plan.    Final Clinical Impressions(s) / ED Diagnoses   Final diagnoses:  Anterior epistaxis    ED Discharge Orders    None       Raziel Koenigs, Chase Picket, PA-C 10/02/18 1828    Tegeler, Canary Brim, MD 10/03/18 [phone]

## 2018-10-02 NOTE — Discharge Instructions (Signed)
It was my pleasure taking care of you today!   Please call the ENT doctor tomorrow to schedule a follow up appointment to get your nasal packing removed.   If you continue to have trouble after trying these techniques, or anything seems out of the ordinary or concerns you, please return to the Emergency Department.

## 2018-11-04 DIAGNOSIS — M79676 Pain in unspecified toe(s): Secondary | ICD-10-CM

## 2018-11-08 ENCOUNTER — Other Ambulatory Visit: Payer: Self-pay | Admitting: Endocrinology

## 2018-11-12 ENCOUNTER — Telehealth: Payer: Self-pay | Admitting: Endocrinology

## 2018-11-12 NOTE — Telephone Encounter (Signed)
error 

## 2018-11-15 ENCOUNTER — Other Ambulatory Visit: Payer: Self-pay | Admitting: Endocrinology

## 2018-11-17 ENCOUNTER — Other Ambulatory Visit: Payer: Self-pay | Admitting: Endocrinology

## 2018-11-19 ENCOUNTER — Other Ambulatory Visit (INDEPENDENT_AMBULATORY_CARE_PROVIDER_SITE_OTHER): Payer: BLUE CROSS/BLUE SHIELD

## 2018-11-19 DIAGNOSIS — Z794 Long term (current) use of insulin: Secondary | ICD-10-CM

## 2018-11-19 DIAGNOSIS — E063 Autoimmune thyroiditis: Secondary | ICD-10-CM | POA: Diagnosis not present

## 2018-11-19 DIAGNOSIS — E1165 Type 2 diabetes mellitus with hyperglycemia: Secondary | ICD-10-CM

## 2018-11-19 LAB — BASIC METABOLIC PANEL
BUN: 13 mg/dL (ref 6–23)
CHLORIDE: 101 meq/L (ref 96–112)
CO2: 27 mEq/L (ref 19–32)
Calcium: 9.5 mg/dL (ref 8.4–10.5)
Creatinine, Ser: 0.54 mg/dL (ref 0.40–1.20)
GFR: 121.55 mL/min (ref >60.00)
GLUCOSE: 165 mg/dL — AB (ref 70–99)
POTASSIUM: 4.2 meq/L (ref 3.5–5.1)
SODIUM: 137 meq/L (ref 135–145)

## 2018-11-19 LAB — MICROALBUMIN / CREATININE URINE RATIO
Creatinine,U: 56.2 mg/dL
Microalb Creat Ratio: 1.2 mg/g (ref 0.0–30.0)

## 2018-11-19 LAB — TSH: TSH: 3.37 u[IU]/mL (ref 0.35–4.50)

## 2018-11-20 LAB — FRUCTOSAMINE: Fructosamine: 326 umol/L — ABNORMAL HIGH (ref 0–285)

## 2018-11-22 ENCOUNTER — Ambulatory Visit: Payer: BLUE CROSS/BLUE SHIELD | Admitting: Endocrinology

## 2018-11-22 ENCOUNTER — Encounter: Payer: Self-pay | Admitting: Endocrinology

## 2018-11-22 VITALS — BP 122/80 | HR 80 | Ht 62.0 in | Wt 177.6 lb

## 2018-11-22 DIAGNOSIS — E1165 Type 2 diabetes mellitus with hyperglycemia: Secondary | ICD-10-CM | POA: Diagnosis not present

## 2018-11-22 DIAGNOSIS — Z794 Long term (current) use of insulin: Secondary | ICD-10-CM | POA: Diagnosis not present

## 2018-11-22 LAB — POCT GLYCOSYLATED HEMOGLOBIN (HGB A1C): Hemoglobin A1C: 8.5 % — AB (ref 4.0–5.6)

## 2018-11-22 NOTE — Patient Instructions (Addendum)
Check blood sugars on waking up 4 days a week  Also check blood sugars about 2 hours after meals and do this after different meals by rotation  Recommended blood sugar levels on waking up are 90-130 and about 2 hours after meal is 130-160  Please bring your blood sugar monitor to each visit, thank you  Novolog  15-16 at supper, take 10 with low carb meal  Take only 1 Metformin in am

## 2018-11-22 NOTE — Progress Notes (Signed)
Patient ID: Natasha Chandler, female   DOB: 08-Jul-1956, 62 y.o.   MRN: [id number]           Reason for Appointment: Follow-up for Type 2 Diabetes  Referring physician: Cam Hai  History of Present Illness:          Date of diagnosis of type 2 diabetes mellitus: 2004       Background history:   She was treated with oral hypoglycemic drugs like metformin and first few years Insulin was started around 2008-9 but details are not available. She thinks she has taken Lantus for several years, at some point was also taking Humalog. Trulicity was started 1-2 years ago, taking Invokana 3 yrs  Over 2-3 years she has been having poor control and has had difficulty focusing on her diabetes because of family issues and periodically not having insurance  Recent history:   INSULIN regimen is: 85 Tresiba in am  12 Novolog ac bid  Non-insulin hypoglycemic drugs the patient is taking are: Metformin ER 1g bid, Invokana 300 mg daily, Ozempic 1 mg weekly   A1c is slightly higher at 8.5 Also fructosamine is relatively higher at 326  Current blood sugar patterns and problems identified:  She was started on NovoLog on her last visit in September with her lunch and dinner but she did not follow-up as directed  She is taking relatively large amount of [place] also  However even with continuing Ozempic and Invokana her weight has gone up  She again is complaining of stress  Because of financial issues she is sitting poorly and high carbohydrate and sometimes high fat meals with less vegetables  Also not walking for exercise  She did not bring her monitor for download  Even with taking about 10 to 12 units of NovoLog with her evening meal she thinks her sugars are mostly over 200 at night  Although she is complaining about the cost of her various healthcare items she thinks she is taking all her prescriptions consistently    Side effects from medications have been:none  Compliance with the  medical regimen: Mostly good Hypoglycemia: none  Glucose monitoring:  done 1-2 times a day         Glucometer: One Touch      Blood Glucose readings from recall:   PRE-MEAL Fasting Lunch Dinner Bedtime Overall  Glucose range: 125-155      Mean/median:        POST-MEAL PC Breakfast PC Lunch PC Dinner  Glucose range:   200  Mean/median:         Self-care: The diet that the patient has been following is: tries to limit fats.     Meal times are: Dinner: Usually at 7 pm      Dietician visit, most recent: 12/2015               Exercise:  not walking    Weight history:   Wt Readings from Last 3 Encounters:  11/22/18 177 lb 9.6 oz (80.6 kg)  08/23/18 172 lb (78 kg)  08/09/18 172 lb (78 kg)    Glycemic control:   Lab Results  Component Value Date   HGBA1C 8.5 (A) 11/22/2018   HGBA1C 7.8 (H) 08/20/2018   HGBA1C 8.2 (H) 05/14/2018   Lab Results  Component Value Date   MICROALBUR <0.7 11/19/2018   LDLCALC 90 02/11/2018   CREATININE 0.54 11/19/2018   Lab Results  Component Value Date   FRUCTOSAMINE 326 (H) 11/19/2018  FRUCTOSAMINE 288 (H) 01/18/2017   FRUCTOSAMINE 266 09/26/2016    Office Visit on 11/22/2018  Component Date Value Ref Range Status  . Hemoglobin A1C 11/22/2018 8.5* 4.0 - 5.6 % Final  Lab on 11/19/2018  Component Date Value Ref Range Status  . TSH 11/19/2018 3.37  0.35 - 4.50 uIU/mL Final  . Microalb, Ur 11/19/2018 <0.7  0.0 - 1.9 mg/dL Final  . Creatinine,U 16/09/9603 56.2  mg/dL Final  . Microalb Creat Ratio 11/19/2018 1.2  0.0 - 30.0 mg/g Final  . Fructosamine 11/19/2018 326* 0 - 285 umol/L Final   Comment: Published reference interval for apparently healthy subjects between age 58 and 52 is 28 - 285 umol/L and in a poorly controlled diabetic population is 228 - 563 umol/L with a mean of 396 umol/L.   Marland Kitchen Sodium 11/19/2018 137  135 - 145 mEq/L Final  . Potassium 11/19/2018 4.2  3.5 - 5.1 mEq/L Final  . Chloride 11/19/2018 101  96 - 112 mEq/L  Final  . CO2 11/19/2018 27  19 - 32 mEq/L Final  . Glucose, Bld 11/19/2018 165* 70 - 99 mg/dL Final  . BUN 54/08/8118 13  6 - 23 mg/dL Final  . Creatinine, Ser 11/19/2018 0.54  0.40 - 1.20 mg/dL Final  . Calcium 14/78/2956 9.5  8.4 - 10.5 mg/dL Final  . GFR 21/30/8657 121.55  >60.00 mL/min Final    Other active problems: See review of systems   Allergies as of 11/22/2018      Reactions   Penicillins Anaphylaxis, Rash   Has patient had a PCN reaction causing immediate rash, facial/tongue/throat swelling, SOB or lightheadedness with hypotension: Yes Has patient had a PCN reaction causing severe rash involving mucus membranes or skin necrosis: No Has patient had a PCN reaction that required hospitalization Yes Has patient had a PCN reaction occurring within the last 10 years: No If all of the above answers are "NO", then may proceed with Cephalosporin use. Has patient had a PCN reaction causing immediate rash, facial/tongue/throat swelling, SOB or lightheadedness with hypotension: Yes Has patient had a PCN reaction causing severe rash involving mucus membranes or skin necrosis: No Has patient had a PCN reaction that required hospitalization Yes Has patient had a PCN reaction occurring within the last 10 years: No If all of the above answers are "NO", then may proceed with Cephalosporin use.   Olive Oil Swelling   Throat swelling   Sulfa Antibiotics Diarrhea   Sulfasalazine Diarrhea      Medication List       Accurate as of November 22, 2018  1:10 PM. Always use your most recent med list.        albuterol 108 (90 Base) MCG/ACT inhaler Commonly known as:  PROVENTIL HFA;VENTOLIN HFA Inhale 1-2 puffs into the lungs every 6 (six) hours as needed for wheezing or shortness of breath.   amLODipine 10 MG tablet Commonly known as:  NORVASC Take 10 mg by mouth daily.   desvenlafaxine 50 MG 24 hr tablet Commonly known as:  PRISTIQ Take 50 mg by mouth daily.   diclofenac 50 MG EC  tablet Commonly known as:  VOLTAREN Take 50 mg by mouth 2 (two) times daily.   glucose blood test strip Commonly known as:  ONETOUCH VERIO Use as instructed to check blood sugars 2-3 times daily.   Insulin Pen Needle 31G X 5 MM Misc Use to inject insulin once daily   INVOKANA 300 MG Tabs tablet Generic drug:  canagliflozin TAKE ONE  TABLET BY MOUTH ONE TIME DAILY BEFORE BREAKFAST   levothyroxine 25 MCG tablet Commonly known as:  SYNTHROID, LEVOTHROID TAKE ONE TABLET BY MOUTH ONE TIME DAILY BEFORE BREAKFAST   losartan 100 MG tablet Commonly known as:  COZAAR Take 100 mg by mouth daily.   metFORMIN 500 MG 24 hr tablet Commonly known as:  GLUCOPHAGE-XR TAKE TWO TABLETS BY MOUTH TWICE DAILY   NOVOLOG FLEXPEN 100 UNIT/ML FlexPen Generic drug:  insulin aspart INJECT 12 UNITS INTO THE SKIN BEFORE MEALS THREE TIMES A DAY   OZEMPIC (1 MG/DOSE) 2 MG/1.5ML Sopn Generic drug:  Semaglutide (1 MG/DOSE) INJECT 1MG  INTO THE SKIN ONCE A WEEK   PREMARIN vaginal cream Generic drug:  conjugated estrogens Place 1 Applicatorful vaginally daily.   simvastatin 20 MG tablet Commonly known as:  ZOCOR Take 20 mg by mouth daily.   TRESIBA FLEXTOUCH 200 UNIT/ML Sopn Generic drug:  Insulin Degludec INJECT 68 UNITS INTO THE SKIN ONCE A DAY       Allergies:  Allergies  Allergen Reactions  . Penicillins Anaphylaxis and Rash    Has patient had a PCN reaction causing immediate rash, facial/tongue/throat swelling, SOB or lightheadedness with hypotension: Yes Has patient had a PCN reaction causing severe rash involving mucus membranes or skin necrosis: No Has patient had a PCN reaction that required hospitalization Yes Has patient had a PCN reaction occurring within the last 10 years: No If all of the above answers are "NO", then may proceed with Cephalosporin use. Has patient had a PCN reaction causing immediate rash, facial/tongue/throat swelling, SOB or lightheadedness with hypotension:  Yes Has patient had a PCN reaction causing severe rash involving mucus membranes or skin necrosis: No Has patient had a PCN reaction that required hospitalization Yes Has patient had a PCN reaction occurring within the last 10 years: No If all of the above answers are "NO", then may proceed with Cephalosporin use.   Gracelyn Nurse Oil Swelling    Throat swelling  . Sulfa Antibiotics Diarrhea  . Sulfasalazine Diarrhea    Past Medical History:  Diagnosis Date  . Asthma   . Diabetes mellitus   . Hyperlipidemia   . Hypertension   . Thyroid disease    Small goiter, stable  . Vitamin D deficiency     Past Surgical History:  Procedure Laterality Date  . TONSILLECTOMY      Family History  Problem Relation Age of Onset  . Diabetes Mother   . Diabetes Father   . Diabetes Maternal Aunt   . Breast cancer Cousin     Social History:  reports that she has never smoked. She has never used smokeless tobacco. She reports current alcohol use. She reports that she does not use drugs.    Review of Systems    Lipid history: Has low HDL and high LDL, on simvastatin 20 mg from PCP with the following results    Lab Results  Component Value Date   CHOL 159 02/11/2018   HDL 34.40 (L) 02/11/2018   LDLCALC 90 02/11/2018   LDLDIRECT 104.0 11/08/2017   TRIG 176.0 (H) 02/11/2018   CHOLHDL 5 02/11/2018           Hypertension: Treated with amlodipine and losartan100 mg  Most recent eye exam was 7/16  Most recent foot exam: 12/16  Thyroid: She has had a Nodular goiter since at least 2013 Thyroid biopsy was negative in 6/17 for a 1.9 cm isthmus  Nodule  HYPOTHYROIDISM: On her visit in September2017 she  was complaining about fatigue and some cold intolerance and her TSH was high at 5.5 She is since then empirically trying 25 g of levothyroxine and this had improved energy level  TSH is consistently normal   Lab Results  Component Value Date   TSH 3.37 11/19/2018   TSH 4.12 05/14/2018    TSH 4.10 02/11/2018   FREET4 0.83 01/18/2017   FREET4 0.80 09/26/2016     She is vitamin D deficient, advised 5000 units vitamin D 3 from PCP   LABS:  Office Visit on 11/22/2018  Component Date Value Ref Range Status  . Hemoglobin A1C 11/22/2018 8.5* 4.0 - 5.6 % Final  Lab on 11/19/2018  Component Date Value Ref Range Status  . TSH 11/19/2018 3.37  0.35 - 4.50 uIU/mL Final  . Microalb, Ur 11/19/2018 <0.7  0.0 - 1.9 mg/dL Final  . Creatinine,U 16/09/9603 56.2  mg/dL Final  . Microalb Creat Ratio 11/19/2018 1.2  0.0 - 30.0 mg/g Final  . Fructosamine 11/19/2018 326* 0 - 285 umol/L Final   Comment: Published reference interval for apparently healthy subjects between age 48 and 40 is 73 - 285 umol/L and in a poorly controlled diabetic population is 228 - 563 umol/L with a mean of 396 umol/L.   Marland Kitchen Sodium 11/19/2018 137  135 - 145 mEq/L Final  . Potassium 11/19/2018 4.2  3.5 - 5.1 mEq/L Final  . Chloride 11/19/2018 101  96 - 112 mEq/L Final  . CO2 11/19/2018 27  19 - 32 mEq/L Final  . Glucose, Bld 11/19/2018 165* 70 - 99 mg/dL Final  . BUN 54/08/8118 13  6 - 23 mg/dL Final  . Creatinine, Ser 11/19/2018 0.54  0.40 - 1.20 mg/dL Final  . Calcium 14/78/2956 9.5  8.4 - 10.5 mg/dL Final  . GFR 21/30/8657 121.55  >60.00 mL/min Final    Physical Examination:  BP 122/80 (BP Location: Left Arm, Patient Position: Sitting, Cuff Size: Normal)   Pulse 80   Ht 5\' 2"  (1.575 m)   Wt 177 lb 9.6 oz (80.6 kg)   SpO2 97%   BMI 32.48 kg/m    ASSESSMENT:  Diabetes type 2, uncontrolled with BMI 33 See history of present illness for detailed discussion of current diabetes management, blood sugar patterns and problems identified  She is currently on basal insulin, Ozempic along with Invokana and metformin  However her A1c continues to go up Not benefiting from Invokana and Ozempic even with taking 1 mg Ozempic now Most of her high readings are probably related to poor diet and lack of exercise  as well as stress She is taking relatively large amount of Lantus now However probably has more high postprandial readings which are not being controlled and not clear if this is from progression of her diabetes  HYPERTENSION: Well controlled  Mild hypothyroidism: Well-controlled   PLAN:    She can watch her fasting readings over the next few days and if starting to get low normal she can reduce her Lantus  More consistent diet and avoid high carbohydrate or high-fat meals  She will continue using the freestyle Libre sensor, co-pay card given  Advised  to avoid taking extra Lantus as this is a slow insulin  She'll continue 0.5 mg Ozempic weekly but consider increasing to 1 mg on the next visit, explained to her that she gets 2 mg in one pen  Continue walking regularly   Patient Instructions  Check blood sugars on waking up 4 days a week  Also check blood sugars about 2 hours after meals and do this after different meals by rotation  Recommended blood sugar levels on waking up are 90-130 and about 2 hours after meal is 130-160  Please bring your blood sugar monitor to each visit, thank you  Novolog  15-16 at supper, take 10 with low carb meal  Take only 1 Metformin in am    Counseling time on subjects discussed above is over 50% of today's 25 minute visit   Reather LittlerAjay Ordean Fouts 11/22/2018, 1:10 PM   Note: This office note was prepared with Dragon voice recognition system technology. Any transcriptional errors that result from this process are unintentional.  Patient ID: Natasha Chandler, female   DOB: 06/14/1956, 62 y.o.   MRN: [id number]           Reason for Appointment: F/u for Type 2 Diabetes  Referring physician: Cam HaiKimberly Shaw  History of Present Illness:          Date of diagnosis of type 2 diabetes mellitus: 2004       Background history:   She was treated with oral hypoglycemic drugs like metformin and first few years Insulin was started around 2008-9 but details  are not available. She thinks she has taken Lantus for several years, at some point was also taking Humalog. Trulicity was started 1-2 years ago, taking Invokana 3 yrs  Over the last 2-3 years she has been having poor control and has had difficulty focusing on her diabetes because of family issues and periodically not having insurance  Recent history:   INSULIN regimen is: Lantus 68 in am    Non-insulin hypoglycemic drugs the patient is taking are: Metformin ER 1g bid, Invokana 300 mg daily, Victoza 1.8mg    A1c is now relatively better at 7.4, previously as high as 9.8  Current blood sugar patterns and problems identified:  She has only in the last 3-4 days started using the freestyle Libre sensor in addition to her One Touch meter  Again usually on her monitor checking readings fasting and not after meals as much  She has started using the Ozempic about a month ago and did take at least 2 injections of the 0.5 dosage, she is tolerating this well and was able to get this with her co-pay card although with some difficulty  She has not had much change in her blood sugars on her monitor as yet although does not have any consistently high post pen  If she did have a significantly high reading of over 300 yesterday afternoon after eating 2 hot dogs  However she took 10 units Lantus extra yesterday and got hypoglycemic last night but blood sugar down to 69 and she treated it with a lot of food  Her overnight blood sugars are otherwise on her continuous monitor around 140  Fasting blood sugars on her fingersticks are variable ranging from 77-165  Postprandial readings otherwise have been occasionally around 180-200   Side effects from medications have been:none  Compliance with the medical regimen: Mostly good Hypoglycemia: Only once as above  Glucose monitoring:  done 1-2 times a day         Glucometer: One Touch and freestyle Libre       Blood Glucose readings by download as  above:  Median blood sugar on her fingersticks overall 112  Self-care: The diet that the patient has been following is: tries to limit fats.     Meal times are: Dinner: 7 pm  Typical meal intake: Breakfast is cereal or eggs, sometimes higher fat meals           Dietician visit, most recent: 4 years ago               Exercise: walking more recently  Weight history: 162-187  Wt Readings from Last 3 Encounters:  11/22/18 177 lb 9.6 oz (80.6 kg)  08/23/18 172 lb (78 kg)  08/09/18 172 lb (78 kg)    Glycemic control:   Lab Results  Component Value Date   HGBA1C 8.5 (A) 11/22/2018   HGBA1C 7.8 (H) 08/20/2018   HGBA1C 8.2 (H) 05/14/2018   Lab Results  Component Value Date   MICROALBUR <0.7 11/19/2018   LDLCALC 90 02/11/2018   CREATININE 0.54 11/19/2018   Office Visit on 11/22/2018  Component Date Value Ref Range Status  . Hemoglobin A1C 11/22/2018 8.5* 4.0 - 5.6 % Final  Lab on 11/19/2018  Component Date Value Ref Range Status  . TSH 11/19/2018 3.37  0.35 - 4.50 uIU/mL Final  . Microalb, Ur 11/19/2018 <0.7  0.0 - 1.9 mg/dL Final  . Creatinine,U 54/08/8118 56.2  mg/dL Final  . Microalb Creat Ratio 11/19/2018 1.2  0.0 - 30.0 mg/g Final  . Fructosamine 11/19/2018 326* 0 - 285 umol/L Final   Comment: Published reference interval for apparently healthy subjects between age 61 and 12 is 84 - 285 umol/L and in a poorly controlled diabetic population is 228 - 563 umol/L with a mean of 396 umol/L.   Marland Kitchen Sodium 11/19/2018 137  135 - 145 mEq/L Final  . Potassium 11/19/2018 4.2  3.5 - 5.1 mEq/L Final  . Chloride 11/19/2018 101  96 - 112 mEq/L Final  . CO2 11/19/2018 27  19 - 32 mEq/L Final  . Glucose, Bld 11/19/2018 165* 70 - 99 mg/dL Final  . BUN 14/78/2956 13  6 - 23 mg/dL Final  . Creatinine, Ser 11/19/2018 0.54  0.40 - 1.20 mg/dL Final  . Calcium 21/30/8657 9.5  8.4 - 10.5 mg/dL Final  . GFR 84/69/6295 121.55  >60.00 mL/min Final    Other active problems: See review of  systems   Allergies as of 11/22/2018      Reactions   Penicillins Anaphylaxis, Rash   Has patient had a PCN reaction causing immediate rash, facial/tongue/throat swelling, SOB or lightheadedness with hypotension: Yes Has patient had a PCN reaction causing severe rash involving mucus membranes or skin necrosis: No Has patient had a PCN reaction that required hospitalization Yes Has patient had a PCN reaction occurring within the last 10 years: No If all of the above answers are "NO", then may proceed with Cephalosporin use. Has patient had a PCN reaction causing immediate rash, facial/tongue/throat swelling, SOB or lightheadedness with hypotension: Yes Has patient had a PCN reaction causing severe rash involving mucus membranes or skin necrosis: No Has patient had a PCN reaction that required hospitalization Yes Has patient had a PCN reaction occurring within the last 10 years: No If all of the above answers are "NO", then may proceed with Cephalosporin use.   Olive Oil Swelling   Throat swelling   Sulfa Antibiotics Diarrhea   Sulfasalazine Diarrhea      Medication List       Accurate as of November 22, 2018  1:10 PM. Always use your most recent med list.        albuterol 108 (90 Base) MCG/ACT inhaler Commonly known as:  PROVENTIL HFA;VENTOLIN HFA Inhale 1-2 puffs into the  lungs every 6 (six) hours as needed for wheezing or shortness of breath.   amLODipine 10 MG tablet Commonly known as:  NORVASC Take 10 mg by mouth daily.   desvenlafaxine 50 MG 24 hr tablet Commonly known as:  PRISTIQ Take 50 mg by mouth daily.   diclofenac 50 MG EC tablet Commonly known as:  VOLTAREN Take 50 mg by mouth 2 (two) times daily.   glucose blood test strip Commonly known as:  ONETOUCH VERIO Use as instructed to check blood sugars 2-3 times daily.   Insulin Pen Needle 31G X 5 MM Misc Use to inject insulin once daily   INVOKANA 300 MG Tabs tablet Generic drug:  canagliflozin TAKE ONE  TABLET BY MOUTH ONE TIME DAILY BEFORE BREAKFAST   levothyroxine 25 MCG tablet Commonly known as:  SYNTHROID, LEVOTHROID TAKE ONE TABLET BY MOUTH ONE TIME DAILY BEFORE BREAKFAST   losartan 100 MG tablet Commonly known as:  COZAAR Take 100 mg by mouth daily.   metFORMIN 500 MG 24 hr tablet Commonly known as:  GLUCOPHAGE-XR TAKE TWO TABLETS BY MOUTH TWICE DAILY   NOVOLOG FLEXPEN 100 UNIT/ML FlexPen Generic drug:  insulin aspart INJECT 12 UNITS INTO THE SKIN BEFORE MEALS THREE TIMES A DAY   OZEMPIC (1 MG/DOSE) 2 MG/1.5ML Sopn Generic drug:  Semaglutide (1 MG/DOSE) INJECT 1MG  INTO THE SKIN ONCE A WEEK   PREMARIN vaginal cream Generic drug:  conjugated estrogens Place 1 Applicatorful vaginally daily.   simvastatin 20 MG tablet Commonly known as:  ZOCOR Take 20 mg by mouth daily.   TRESIBA FLEXTOUCH 200 UNIT/ML Sopn Generic drug:  Insulin Degludec INJECT 68 UNITS INTO THE SKIN ONCE A DAY       Allergies:  Allergies  Allergen Reactions  . Penicillins Anaphylaxis and Rash    Has patient had a PCN reaction causing immediate rash, facial/tongue/throat swelling, SOB or lightheadedness with hypotension: Yes Has patient had a PCN reaction causing severe rash involving mucus membranes or skin necrosis: No Has patient had a PCN reaction that required hospitalization Yes Has patient had a PCN reaction occurring within the last 10 years: No If all of the above answers are "NO", then may proceed with Cephalosporin use. Has patient had a PCN reaction causing immediate rash, facial/tongue/throat swelling, SOB or lightheadedness with hypotension: Yes Has patient had a PCN reaction causing severe rash involving mucus membranes or skin necrosis: No Has patient had a PCN reaction that required hospitalization Yes Has patient had a PCN reaction occurring within the last 10 years: No If all of the above answers are "NO", then may proceed with Cephalosporin use.   Gracelyn Nurse. Olive Oil Swelling     Throat swelling  . Sulfa Antibiotics Diarrhea  . Sulfasalazine Diarrhea    Past Medical History:  Diagnosis Date  . Asthma   . Diabetes mellitus   . Hyperlipidemia   . Hypertension   . Thyroid disease    Small goiter, stable  . Vitamin D deficiency     Past Surgical History:  Procedure Laterality Date  . TONSILLECTOMY      Family History  Problem Relation Age of Onset  . Diabetes Mother   . Diabetes Father   . Diabetes Maternal Aunt   . Breast cancer Cousin     Social History:  reports that she has never smoked. She has never used smokeless tobacco. She reports current alcohol use. She reports that she does not use drugs.    Review of Systems  Lipid history: Has low HDL and borderline LDL, on simvastatin 20 mg from PCP    Lab Results  Component Value Date   CHOL 159 02/11/2018   HDL 34.40 (L) 02/11/2018   LDLCALC 90 02/11/2018   LDLDIRECT 104.0 11/08/2017   TRIG 176.0 (H) 02/11/2018   CHOLHDL 5 02/11/2018           Hypertension: Treated with amlodipine and losartan 100 mg, adequately controlled   Most recent foot exam: 8/18 from PCP   Thyroid: She has had a Nodular goiter since at least 2013 Thyroid biopsy was negative in 6/17 for a 1.9 cm  thmus  Nodule  HYPOTHYROIDISM: On her visit in September 2017 she was complaining about fatigue and some cold intolerance and her TSH was high at 5.5 With 25 g of levothyroxine she has had less fatigue and TSH has been in the normal range consistently  TSH is normal as follows    Lab Results  Component Value Date   TSH 3.37 11/19/2018   TSH 4.12 05/14/2018   TSH 4.10 02/11/2018   FREET4 0.83 01/18/2017   FREET4 0.80 09/26/2016     She is vitamin D deficient, has been advised 5000 units vitamin D3 from PCP   Review of Systems   LABS:  Office Visit on 11/22/2018  Component Date Value Ref Range Status  . Hemoglobin A1C 11/22/2018 8.5* 4.0 - 5.6 % Final  Lab on 11/19/2018  Component Date Value Ref  Range Status  . TSH 11/19/2018 3.37  0.35 - 4.50 uIU/mL Final  . Microalb, Ur 11/19/2018 <0.7  0.0 - 1.9 mg/dL Final  . Creatinine,U 40/98/1191 56.2  mg/dL Final  . Microalb Creat Ratio 11/19/2018 1.2  0.0 - 30.0 mg/g Final  . Fructosamine 11/19/2018 326* 0 - 285 umol/L Final   Comment: Published reference interval for apparently healthy subjects between age 38 and 77 is 61 - 285 umol/L and in a poorly controlled diabetic population is 228 - 563 umol/L with a mean of 396 umol/L.   Marland Kitchen Sodium 11/19/2018 137  135 - 145 mEq/L Final  . Potassium 11/19/2018 4.2  3.5 - 5.1 mEq/L Final  . Chloride 11/19/2018 101  96 - 112 mEq/L Final  . CO2 11/19/2018 27  19 - 32 mEq/L Final  . Glucose, Bld 11/19/2018 165* 70 - 99 mg/dL Final  . BUN 47/82/9562 13  6 - 23 mg/dL Final  . Creatinine, Ser 11/19/2018 0.54  0.40 - 1.20 mg/dL Final  . Calcium 13/07/6577 9.5  8.4 - 10.5 mg/dL Final  . GFR 46/96/2952 121.55  >60.00 mL/min Final    Physical Examination:  BP 122/80 (BP Location: Left Arm, Patient Position: Sitting, Cuff Size: Normal)   Pulse 80   Ht 5\' 2"  (1.575 m)   Wt 177 lb 9.6 oz (80.6 kg)   SpO2 97%   BMI 32.48 kg/m    ASSESSMENT:  Diabetes type 2, uncontrolled with BMI 32  See history of present illness for detailed discussion of current diabetes management, blood sugar patterns and problems identified  She is on a regimen of basal bolus insulin, Ozempic along with Invokana and metformin  A1c is 8.5 and increasing  Most likely the reason for her poor control is poor diet since she is still having high postprandial readings despite starting NovoLog Also has gained weight from increased caloric intake and continued lack of exercise She did not bring her monitor and not clear how often she is having hyperglycemia   HYPOTHYROIDISM:  TSH within normal range   PLAN:  She will need to take at least 2 to 4 units more NovoLog for her meals for usual meals  If her morning sugars do not  improve consistently she will need to increase her Tresiba by 4 units also  Given her parameters for her blood sugar targets  Since she is having some nausea with metformin in the morning she can reduce it to 500 mg instead of 1000  She will need to continue her Invokana and metformin  Patient Instructions  Check blood sugars on waking up 4 days a week  Also check blood sugars about 2 hours after meals and do this after different meals by rotation  Recommended blood sugar levels on waking up are 90-130 and about 2 hours after meal is 130-160  Please bring your blood sugar monitor to each visit, thank you  Novolog  15-16 at supper, take 10 with low carb meal  Take only 1 Metformin in am         Reather Littler 11/22/2018, 1:10 PM   Note: This office note was prepared with Insurance underwriter. Any transcriptional errors that result from this process are unintentional.

## 2018-11-28 ENCOUNTER — Other Ambulatory Visit: Payer: Self-pay | Admitting: Endocrinology

## 2019-09-21 ENCOUNTER — Other Ambulatory Visit: Payer: Self-pay | Admitting: Endocrinology

## 2019-09-22 ENCOUNTER — Emergency Department (HOSPITAL_BASED_OUTPATIENT_CLINIC_OR_DEPARTMENT_OTHER)
Admission: EM | Admit: 2019-09-22 | Discharge: 2019-09-22 | Disposition: A | Discharge status: Home / Self Care | Payer: BLUE CROSS/BLUE SHIELD | Attending: Emergency Medicine | Admitting: Emergency Medicine

## 2019-09-22 ENCOUNTER — Encounter (HOSPITAL_BASED_OUTPATIENT_CLINIC_OR_DEPARTMENT_OTHER): Payer: Self-pay | Admitting: *Deleted

## 2019-09-22 ENCOUNTER — Other Ambulatory Visit: Payer: Self-pay

## 2019-09-22 DIAGNOSIS — Z794 Long term (current) use of insulin: Secondary | ICD-10-CM | POA: Insufficient documentation

## 2019-09-22 DIAGNOSIS — E119 Type 2 diabetes mellitus without complications: Secondary | ICD-10-CM | POA: Diagnosis not present

## 2019-09-22 DIAGNOSIS — R040 Epistaxis: Secondary | ICD-10-CM | POA: Diagnosis not present

## 2019-09-22 DIAGNOSIS — Z79899 Other long term (current) drug therapy: Secondary | ICD-10-CM | POA: Diagnosis not present

## 2019-09-22 DIAGNOSIS — I10 Essential (primary) hypertension: Secondary | ICD-10-CM | POA: Insufficient documentation

## 2019-09-22 DIAGNOSIS — E039 Hypothyroidism, unspecified: Secondary | ICD-10-CM | POA: Insufficient documentation

## 2019-09-22 MED ORDER — OXYMETAZOLINE HCL 0.05 % NA SOLN
1.0000 | Freq: Once | NASAL | Status: AC
  Administered 2019-09-22: 1 via NASAL
  Filled 2019-09-22: qty 30

## 2019-09-22 MED ORDER — SILVER NITRATE-POT NITRATE 75-25 % EX MISC
CUTANEOUS | Status: AC
  Administered 2019-09-22: 13:00:00
  Filled 2019-09-22: qty 1

## 2019-09-22 NOTE — ED Notes (Signed)
Patient transported to Ultrasound 

## 2019-09-22 NOTE — Discharge Instructions (Signed)
Have the packing in place until following up with ENT. Return to the ED for worsening symptoms, increasing bleeding, lightheadedness, chest pain or shortness of breath.

## 2019-09-22 NOTE — ED Provider Notes (Signed)
[facility] EMERGENCY DEPARTMENT Provider Note   CSN: [id number] Arrival date & time: 09/22/19  1101     History   Chief Complaint Chief Complaint  Patient presents with  . Epistaxis    HPI Natasha Chandler is a 63 y.o. female with a past medical history of diabetes, hypertension, hyperlipidemia presenting to the ED for evaluation of left-sided epistaxis for the past 2 hours.  States that the bleeding has been intermittent and she has tried packing it with a large tampon.  States that in the past she has had intermittent nosebleeds which require packing.  She has seen a specialist in the past but has not seen one in over a year.  She does know dryness and congestion in her nose around this time of the year.  Denies spitting up blood.  Did remove a large clot from the area.  Denies any other trauma.     HPI  Past Medical History:  Diagnosis Date  . Asthma   . Diabetes mellitus   . Hyperlipidemia   . Hypertension   . Thyroid disease    Small goiter, stable  . Vitamin D deficiency     Patient Active Problem List   Diagnosis Date Noted  . Pain in left hip 05/23/2017  . Primary osteoarthritis of left hip 05/23/2017  . 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 Epistaxis 01/30/2017  . Perennial allergic rhinitis 01/30/2017  . Uncontrolled type 2 diabetes mellitus with hyperglycemia, with long-term current use of insulin ([facility]) 09/28/2016  . 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 Pure hypercholesterolemia 02/18/2016  . Plantar fasciitis 07/05/2011  . Asthma 07/05/2011  . Multinodular goiter 07/05/2011  . Hypertension 07/05/2011    Past Surgical History:  Procedure Laterality Date  . TONSILLECTOMY       OB History   No obstetric history on file.      Home Medications    Prior to Admission medications   Medication Sig Start Date End Date Taking? Authorizing Provider  albuterol (PROVENTIL HFA;VENTOLIN HFA) 108 (90 Base) MCG/ACT inhaler Inhale 1-2  puffs into the lungs every 6 (six) hours as needed for wheezing or shortness of breath.  11/26/16  Yes [provider]  amLODipine (NORVASC) 10 MG tablet Take 10 mg by mouth daily.   Yes [provider]  desvenlafaxine (PRISTIQ) 50 MG 24 hr tablet Take 50 mg by mouth daily.  08/13/17  Yes [provider]  diclofenac (VOLTAREN) 50 MG EC tablet Take 50 mg by mouth 2 (two) times daily.   Yes [provider]  empagliflozin (JARDIANCE) 25 MG TABS tablet Take by mouth. 04/22/19  Yes [provider]  glucose blood (ONETOUCH VERIO) test strip Use as instructed to check blood sugars 2-3 times daily. 02/14/18  Yes Elayne Snare, MD  Insulin Pen Needle 31G X 5 MM MISC Use to inject insulin once daily 01/25/17  Yes Elayne Snare, MD  levothyroxine (SYNTHROID, LEVOTHROID) 25 MCG tablet TAKE ONE TABLET BY MOUTH ONE TIME DAILY BEFORE BREAKFAST Patient taking differently: Take 25 mcg by mouth daily before breakfast. TAKE ONE TABLET BY MOUTH ONE TIME DAILY BEFORE BREAKFAST 09/03/18  Yes Elayne Snare, MD  losartan (COZAAR) 100 MG tablet Take 100 mg by mouth daily.   Yes [provider]  montelukast (SINGULAIR) 10 MG tablet Take by mouth. 03/20/19  Yes [provider]  NOVOLOG FLEXPEN 100 UNIT/ML FlexPen inject 12 units into the skin before meals three times a day 11/28/18  Yes Elayne Snare,  MD  OZEMPIC, 1 MG/DOSE, 2 MG/1.5ML SOPN INJECT 1MG  INTO THE SKIN ONCE A WEEK 11/28/18  Yes 11/30/18, MD  pioglitazone (ACTOS) 30 MG tablet Take by mouth. 07/24/19  Yes [provider]  simvastatin (ZOCOR) 20 MG tablet Take 20 mg by mouth daily.   Yes [provider]  INVOKANA 300 MG TABS tablet TAKE ONE TABLET BY MOUTH ONE TIME DAILY  before breakfast 11/28/18   11/30/18, MD  metFORMIN (GLUCOPHAGE-XR) 500 MG 24 hr tablet TAKE TWO TABLETS BY MOUTH TWICE DAILY  Patient taking differently: Take 1,000 mg by mouth 2 (two) times daily.  07/23/18   07/25/18, MD   PREMARIN vaginal cream Place 1 Applicatorful vaginally daily.  08/13/17   [provider]  TRESIBA FLEXTOUCH 200 UNIT/ML SOPN INJECT 68 UNITS INTO THE SKIN ONCE A DAY 11/18/18   11/20/18, MD    Family History Family History  Problem Relation Age of Onset  . Diabetes Mother   . Diabetes Father   . Diabetes Maternal Aunt   . Breast cancer Cousin     Social History Social History   Tobacco Use  . Smoking status: Never Smoker  . Smokeless tobacco: Never Used  Substance Use Topics  . Alcohol use: Yes    Comment: wine  . Drug use: Never     Allergies   Penicillins, Olive oil, Sulfa antibiotics, and Sulfasalazine   Review of Systems Review of Systems  Constitutional: Negative for appetite change, chills and fever.  HENT: Positive for nosebleeds. Negative for ear pain, rhinorrhea, sneezing and sore throat.   Eyes: Negative for photophobia and visual disturbance.  Respiratory: Negative for cough, chest tightness, shortness of breath and wheezing.   Cardiovascular: Negative for chest pain and palpitations.  Gastrointestinal: Negative for abdominal pain, blood in stool, constipation, diarrhea, nausea and vomiting.  Genitourinary: Negative for dysuria, hematuria and urgency.  Musculoskeletal: Negative for myalgias.  Skin: Negative for rash.  Neurological: Negative for dizziness, weakness and light-headedness.     Physical Exam Updated Vital Signs BP (!) 156/78   Pulse 81   Temp 97.8 F (36.6 C) (Oral)   Resp 20   Ht 5\' 2"  (1.575 m)   Wt 82.6 kg   SpO2 99%   BMI 33.29 kg/m   Physical Exam Vitals signs and nursing note reviewed.  Constitutional:      General: She is not in acute distress.    Appearance: She is well-developed.  HENT:     Head: Normocephalic and atraumatic.     Nose:     Left Nostril: Epistaxis (controlled) present.  Eyes:     General: No scleral icterus.       Left eye: No discharge.     Conjunctiva/sclera: Conjunctivae normal.   Neck:     Musculoskeletal: Normal range of motion and neck supple.  Cardiovascular:     Rate and Rhythm: Normal rate and regular rhythm.     Heart sounds: Normal heart sounds. No murmur. No friction rub. No gallop.   Pulmonary:     Effort: Pulmonary effort is normal. No respiratory distress.     Breath sounds: Normal breath sounds.  Abdominal:     General: Bowel sounds are normal. There is no distension.     Palpations: Abdomen is soft.     Tenderness: There is no abdominal tenderness. There is no guarding.  Musculoskeletal: Normal range of motion.  Skin:    General: Skin is warm and dry.  Findings: No rash.  Neurological:     Mental Status: She is alert.     Motor: No abnormal muscle tone.     Coordination: Coordination normal.      ED Treatments / Results  Labs (all labs ordered are listed, but only abnormal results are displayed) Labs Reviewed - No data to display  EKG None  Radiology No results found.  Procedures Procedures (including critical care time)  Medications Ordered in ED Medications  oxymetazoline (AFRIN) 0.05 % nasal spray 1 spray (1 spray Each Nare Given by Other 09/22/19 1316)  silver nitrate applicators 75-25 % applicator (  Given by Other 09/22/19 1314)     Initial Impression / Assessment and Plan / ED Course  I have reviewed the triage vital signs and the nursing notes.  Pertinent labs & imaging results that were available during my care of the patient were reviewed by me and considered in my medical decision making (see chart for details).        63 year old female presents to the ED for left-sided epistaxis.  Symptoms have been going on for the past 2 hours prior to arrival.  History of similar symptoms in the past that has required packing.  Tried to cauterize here but was unsuccessful.  Nasal balloon was placed by Dr. Clarice PolePfeifer.  Bleeding controlled here.  Patient tolerated procedure well.  Will have follow-up with ENT and return for  worsening symptoms.  Patient is hemodynamically stable, in NAD, and able to ambulate in the ED. Evaluation does not show pathology that would require ongoing emergent intervention or inpatient treatment. I explained the diagnosis to the patient. Pain has been managed and has no complaints prior to discharge. Patient is comfortable with above plan and is stable for discharge at this time. All questions were answered prior to disposition. Strict return precautions for returning to the ED were discussed. Encouraged follow up with PCP.   An After Visit Summary was printed and given to the patient.   Portions of this note were generated with Scientist, clinical (histocompatibility and immunogenetics)Dragon dictation software. Dictation errors may occur despite best attempts at proofreading.   Final Clinical Impressions(s) / ED Diagnoses   Final diagnoses:  Left-sided epistaxis    ED Discharge Orders    None       Dietrich PatesKhatri, Noreene Boreman, PA-C 09/22/19 1352    Arby BarrettePfeiffer, Marcy, MD 09/24/19 [id number]

## 2019-09-22 NOTE — ED Triage Notes (Signed)
Nosebleed for several hours. She has tissue in her left nare.

## 2019-09-24 NOTE — ED Provider Notes (Signed)
Medical screening examination/treatment/procedure(s) were conducted as a shared visit with non-physician practitioner(s) and myself.  I personally evaluated the patient during the encounter.    Marland KitchenEpistaxis Management  Date/Time: 09/24/2019 8:23 AM Performed by: Charlesetta Shanks, MD Authorized by: Charlesetta Shanks, MD   Consent:    Consent obtained:  Verbal   Consent given by:  Patient   Risks discussed:  Bleeding, infection, nasal injury and pain   Alternatives discussed:  Delayed treatment and referral Anesthesia (see MAR for exact dosages):    Anesthesia method:  None Procedure details:    Treatment site:  L posterior   Treatment method:  Nasal balloon   Treatment complexity:  Extensive   Treatment episode: recurring   Post-procedure details:    Assessment:  Bleeding stopped   Patient tolerance of procedure:  Tolerated well, no immediate complications  Patient had nasal bleeding that recurred after initial treatment with silver nitrate.  There was evidence of posterior bleeding with blood tracking down the posterior oropharynx.  I placed a nasal balloon as outlined.  This was rechecked after 30 minutes of observation.  No posterior oropharyngeal blood present.  Patient had very modest amount of blood-tinged secretions anteriorly.  No recurrence of active bleeding.   Charlesetta Shanks, MD 09/24/19 [phone]

## 2020-07-13 ENCOUNTER — Other Ambulatory Visit: Payer: Self-pay | Admitting: General Practice

## 2020-07-13 NOTE — Patient Outreach (Signed)
[facility]) Care Management  07/13/2020  Natasha Chandler Apr 11, 1956 [id number]   Client is newly enrolled in the Special Needs Plan program with Type II Diabetes. Individualized Care Plan (ICP) completed with information from the Health Risk Assessment on file. Client also has a history of Hypertension, Asthma and Depression/Anxiety per the Electronic Medical Records. Will send an introductory letter with ICP to the primary provider and client, along with educational materials. No recent ED or acute care admissions in 2021. Assigned RN Care Coordinator will follow up within 3 months.

## 2020-08-02 ENCOUNTER — Other Ambulatory Visit: Payer: Self-pay

## 2020-08-02 ENCOUNTER — Ambulatory Visit: Payer: Self-pay

## 2020-08-02 NOTE — Patient Outreach (Signed)
  [facility]) Care Management Chronic Special Needs Program    08/02/2020  Name: Natasha Chandler, DOB: 11/11/1956  MRN: [id number]   Ms. Angeliz Settlemyre is enrolled in a chronic special needs plan for Diabetes. Telephone call to client for Health risk assessment review/ initial CSNP outreach. Unable to reach. HIPAA compliant voice message left with call back phone number.    PLAN; RNCM will attempt 2nd telephone call to client in 2 weeks.   [name] RN,BSN,CCM Chronic Care Management Coordinator [facility] Care Management [phone]

## 2020-08-04 ENCOUNTER — Other Ambulatory Visit: Payer: Self-pay

## 2020-08-04 NOTE — Patient Outreach (Signed)
[facility]) Care Management Chronic Special Needs Program  08/04/2020  Name: Natasha Chandler DOB: 1956-06-05  MRN: [id number]  [name] is enrolled in a chronic special needs plan for Diabetes. Chronic Care Management Coordinator telephoned client to review health risk assessment and to develop individualized care plan. HIPAA verified. Introduced the chronic care management program, importance of client participation, and taking their care plan to all provider appointments and inpatient facilities.  Client states she is unable to afford several of her medications. She states at times she has to choice between her medication or food. She reports having difficulty affording her albuterol inhaler, Budesonide, Pristiq, Jardiance, Ozempic, Symbicort, Tresiba and Premarin. Client states she also gets nauseated at times when taking her medications. RNCM discussed and offered medication assistance / management follow up with pharmacist. Client verbally agreed. Client reports her food assistance through the Department of social services ended in August 2021. She reports also using Health Packs and Moms meals in the past but denies having food assistance at this time. RNCM discussed and offered social work follow up.  Client verbally agreed. Client states she is feeling overwhelmed due to her personal financial situation and inability to afford her medications.  Client states her blood sugars range from 130 to 160 fasting. She denies checking her blood sugar this am.  Client states her Hgb A1c is 7.7.  Client reports occasionally blood sugar drops below 70.    Goals Addressed            This Visit's Progress   . "I want to be able to obtain my medications."       RN case manager will refer client to pharmacist for medication assistance.    . Client will have food insecurities addressed in 6 months.       RN case manager will refer client to Child psychotherapist.     . Client will report  improved coping by discussing any issues with RN CM during telephone assessments and calls.       RN CM sending Emmi Education on "Tips for How to Help Your Mood". If you are on medications, take as prescribed. RN case manager allowed client to discuss her concerns. RN case manager will refer client to social worker for follow up.       Marland Kitchen HEMOGLOBIN A1C < 7       Your last documented A1C is 7.7.  Check blood sugars daily before eating with a goal of 80-130. You can also check  1 1/2 hours after eating with goal of 180 or less. Plan to eat low carbohydrate and low salt meals, watch portion sizes and avoid sugar sweetened drinks.  Review [employer] calendar sent in the mail for Diabetes Action Plan. Please follow the RULE OF 15 for the treatment of hypoglycemia treatment (When your blood sugars are less than 70 mg/ dl) STEP  1:  Take 15 grams of carbohydrates when your blood sugar is low, which includes:   3-4 glucose tabs or  3-4 oz of juice or regular soda or  One tube of glucose gel STEP 2:  Recheck blood sugar in 15 minutes STEP 3:  If your blood sugar is still low at the 15 minute recheck ---then, go back to STEP 1 and treat again with another 15 grams of carbohydrates     . Maintain timely refills of diabetic medication as prescribed within the year .       RN CM will  refer your information to a pharmacist for medication assistance.     . COMPLETED: Obtain annual  Lipid Profile, LDL-C   On track    Your last Lipid profile was completed on 07/02/20    . Obtain Annual Eye (retinal)  Exam        Your last documented eye exam was 05/28/19.  At the time, the result was positive for retinopathy. Diabetes can affect your vision. Plan to have a dilated eye exam every year.  RN CM mailed information on Diabetic Retinopathy with quarterly education mailings.     . COMPLETED: Obtain Annual Foot Exam   On track    Your last annual foot exam was 07/01/20    . COMPLETED: Obtain annual  screen for micro albuminuria (urine) , nephropathy (kidney problems)   On track    Your last mircro albuminuria was 07/02/20    . COMPLETED: Obtain Hemoglobin A1C at least 2 times per year   On track    Hgb A1c completed on 07/01/20 and 03/12/20    . Visit Primary Care Provider or Endocrinologist at least 2 times per year        Primary care provider visits July 2020 per client  Endocrinology visit 07/01/20 and 03/12/20 Follow up with your providers regularly.        Plan:  Send successful outreach letter with a copy of their individualized care plan and Send individual care plan to provider  Chronic care management coordination will outreach in:  6 Months  Will refer client to:  Social Work and Pharmacy and health coach  [name] RN,BSN,CCM Chronic Care Management Coordinator [facility] Care Management [phone]

## 2020-08-04 NOTE — Patient Outreach (Signed)
  [facility]) Care Management Chronic Special Needs Program    08/04/2020  Name: Natasha Chandler, DOB: 01/23/1956  MRN: [id number]   Natasha Chandler is enrolled in a chronic special needs plan.  Second telephone call for Health risk assessment review/ initial CSNP outreach. Unable to reach. HPAA complaint voice message left with call back phone number and return call request.   PLAN;  RNCM will attempt 3rd telephone call to client in 2 weeks.   [name] RN,BSN,CCM Chronic Care Management Coordinator [facility] Care Management [phone]

## 2020-08-10 ENCOUNTER — Encounter: Payer: Self-pay | Admitting: *Deleted

## 2020-08-10 ENCOUNTER — Other Ambulatory Visit: Payer: Self-pay | Admitting: *Deleted

## 2020-08-10 NOTE — Patient Outreach (Signed)
[facility]) Care Management  08/10/2020  Natasha Chandler March 09, 1956 [id number]   CSW made an initial attempt to try and contact patient today to perform the phone assessment, as well as assess and assist with social work needs and services, without success.  A HIPAA compliant message was left for patient on voicemail.  CSW is currently awaiting a return call.  CSW will make a second outreach attempt within the next 3-4 business days, if a return call is not received from patient in the meantime.  CSW will also mail a Patient Unsuccessful Outreach Letter to patient's home, requesting that she contact CSW if she is interested in receiving social work services through CSW with [facility] [employer].  Danford Bad, BSW, MSW, LCSW  Licensed Restaurant manager, [employer] [facility]  Mailing [place] [address] [place], [place] [address] Physical Address-[address], [place], [place] [address] Toll Free Main # [phone] Fax # [phone] Cell # [phone]  [email or web address]

## 2020-08-12 ENCOUNTER — Other Ambulatory Visit: Payer: Self-pay | Admitting: *Deleted

## 2020-08-12 ENCOUNTER — Ambulatory Visit: Payer: Self-pay

## 2020-08-12 NOTE — Patient Outreach (Signed)
[facility]) Care Management  08/12/2020  Natasha Chandler 10-22-1956 [id number]   CSW received an incoming call from patient today, in response to the HIPAA compliant message left for patient on voicemail by CSW earlier in the week, on Tuesday, August 10, 2020.  CSW was able to perform the initial phone assessment with patient, as well as assess and assist with social work needs and services.  CSW introduced self, explained role and types of services provided through [employer] [facility]).  CSW further explained to patient that CSW works with patient's Chronic Special Needs Plan Coordinator, also with [facility] Care Management, Natasha Chandler.  CSW then explained the reason for the call, indicating that Mrs. Natasha Chandler thought that patient would benefit from social work services and resources to assist with counseling and supportive services for symptoms of Depression and Anxiety.  Mrs. Natasha Chandler went on to explain that patient is "under a lot of stress", experiencing food insecurities, difficulty paying for prescription medications, inability to pay medical expenses, etc.  CSW obtained two HIPAA compliant identifiers from patient, which included patient's name and date of birth.  Patient admitted that she often has to choose between buying food or buying her prescription medications, due to lack of funds.  Patient went on to explain that she was receiving Mom's Meals, as well as food assistance, through the [facility], but both programs have since been terminated.  CSW voiced understanding, agreeing to approve patient for another 28-day supply of Mom's Meals, courtesy of [facility].  CSW also placed a referral for patient, through the [employer], to the [employer] for Older Adults, as well as Meals on [employer], both offered through [employer] of [place].   CSW offered to assist patient with completion of an application for Food Stamps, emailing ([email or web address]) patient the application today.  Patient did not wish to apply for Medicaid, admitting that she just recently applied to the [facility] but was denied.  Patient was told that she makes $35.00 too much each month in Social Security Disability to be eligible.  Last, CSW will email patient [employer] - [employer] [address], [employer] and [employer] in [place], The [employer] - [employer] in [place] and a complete list of [employer].    Patient verbalized that she recently submitted applications to [employer], [employer] and [employer] to apply for Extra Help with several of her prescription medications.  Patient is just waiting to find out whether or not she has been approved.  Fortunately, patient's Primary Care Physician, Dr. Irving Chandler was able to provide patient with a month's worth of samples for several of her medications that will hopefully last her until mid October.  Patient indicated that she is currently in the "donut hole" with her [employer] Medicare; therefore, she suspects that she would be responsible for paying a little over $700.00 a month, out-of-pocket, for several of her prescription medications.  Patient stated, "I'm really trying to get my Diabetes under control, eat healthier and exercise on a regular basis, but it's very hard to do when you can't afford your medications, insulin, syringes, and healthy food items".  Patient further stated that she is currently working with a pharmacist, with regards to financial assistance for her medications, in addition to a dietician, with regards to nutrition education and making healthy food choices.  According to Natasha Chandler, patient scored a 3 on her PHQ-2 Depression Screening and a 15 on her PHQ-9 Depression Screening.  Patient reported that  she is not currently involved with a therapist or psychiatrist, but that she may be interested in receiving resources and a referral for services.  CSW offered to provide free short-term telephonic counseling services to patient, at least until CSW is able to assist patient in getting established with a therapist and psychiatrist in the community.  Patient agreed to give it some consideration, admitting that she has not since a therapist and/or psychiatrist since 2003.  Patient ensured CSW that she is currently taking all medications exactly as prescribed, especially her antidepressant medication, Pristiq 50 MG, PO, Daily.  Patient denies experiencing homicidal or suicidal ideations, nor is patient a victim of domestic violence.  Patient was agreeable to having CSW follow-up with her again next week, so CSW will make arrangements to contact patient on Friday, August 20, 2020, around 9:00am.  CSW was able to confirm that patient has the correct contact information for CSW, encouraging her to contact CSW directly if additional social work needs arise in the meantime.  Patient was agreeable to this plan and most appreciative of the call.  Natasha Chandler, BSW, MSW, LCSW  Licensed Restaurant manager, fast food [facility]  Mailing [place] [address], [place], [place] [address] Physical Address-[address], [place], [place] [address] Toll Free Main # [phone] Fax # [phone] Cell # [phone]  [email or web address]

## 2020-08-13 ENCOUNTER — Encounter: Payer: Self-pay | Admitting: *Deleted

## 2020-08-13 ENCOUNTER — Ambulatory Visit: Payer: Self-pay | Admitting: *Deleted

## 2020-08-16 ENCOUNTER — Ambulatory Visit: Payer: Self-pay | Admitting: *Deleted

## 2020-08-19 ENCOUNTER — Ambulatory Visit: Payer: HMO | Admitting: *Deleted

## 2020-08-20 ENCOUNTER — Encounter: Payer: Self-pay | Admitting: *Deleted

## 2020-08-20 ENCOUNTER — Other Ambulatory Visit: Payer: Self-pay | Admitting: *Deleted

## 2020-08-20 NOTE — Patient Outreach (Signed)
[facility]) Care Management  08/20/2020  Natasha Chandler 1956/05/28 [id number]   CSW was able to make contact with patient today to follow-up regarding social work services and resources, as well as to confirm that she received the resource information and applications that [employer] emailed to her last week, on Thursday, August 12, 2020.  Patient confirmed receipt, admitting that it is a lot of information for her to review, but she was extremely appreciative of the resources.  Patient also confirmed receipt of her two weeks worth (14-day supply) of prepared meals, through [employer], arranged for her by CSW, courtesy of [facility].  CSW reminded patient that CSW placed referrals for her through the [employer], to the [employer] for Older Adults, as well as Meals on [employer], both offered through [employer] of [place].  Last, patient has a copy of The [place] in [place] in [place], a complete list of [place], a list of emergency assistance programs and a list of community agencies and resources offering financial assistance, also emailed to her by CSW.  Patient indicated that she is still waiting to hear back from the pharmaceutical companies as to whether or not she has been approved for medication assistance and/or Extra Help.  Patient admitted that she really does not believe that she is interested in receiving counseling and supportive services at this time, but assured CSW that she would contact me directly if she changes her mind, or if additional social work needs arise in the near future.  Patient reasoned that she has entirely too much going on right now to be able to focus on therapy.  CSW will perform a case closure on patient, as all goals of treatment have been met from social work  standpoint and no additional social work needs have been identified at this time.  CSW will notify Quinn Plowman, patient's Chronic Special Needs Program Coordinator, also with [facility], of CSW's plans to close patient's case.  CSW will fax an update to patient's Primary Care Physician, Dr. Diamantina Providence to ensure that she is aware of CSW's involvement with patient's plan of care, in addition to routing a Physician Case Closure Letter to Dr. Justin Mend.    Nat Christen, BSW, MSW, LCSW  Licensed Education officer, environmental Health System  Mailing [place] [address] [place], [place] [address] Physical Address-[address], [place], [place] [address] Toll Free Main # [phone] Fax # [phone] Cell # [phone]  [email or web address]

## 2020-10-18 ENCOUNTER — Other Ambulatory Visit: Payer: Self-pay

## 2020-10-18 NOTE — Patient Outreach (Signed)
  [facility]) Care Management Chronic Special Needs Program    10/18/2020  Name: Natasha Chandler, DOB: 07/17/1956  MRN: [id number]   [name] is enrolled in a chronic special needs plan for Diabetes  [facility] Management will continue to provide services for this member through 12/03/20.  The [employer] care management team will assume care 12/04/2020.   [name] RN,BSN,CCM Chronic Care Management Coordinator [facility] Management [phone]  .

## 2020-12-07 ENCOUNTER — Other Ambulatory Visit: Payer: Self-pay

## 2021-02-01 ENCOUNTER — Ambulatory Visit: Payer: Self-pay

## 2022-11-08 ENCOUNTER — Other Ambulatory Visit (HOSPITAL_BASED_OUTPATIENT_CLINIC_OR_DEPARTMENT_OTHER): Payer: Self-pay

## 2022-11-08 MED ORDER — OZEMPIC (2 MG/DOSE) 8 MG/3ML ~~LOC~~ SOPN
2.0000 mg | PEN_INJECTOR | SUBCUTANEOUS | 1 refills | Status: DC
  Filled 2022-11-08: qty 3, 28d supply, fill #0

## 2023-03-03 ENCOUNTER — Emergency Department (HOSPITAL_COMMUNITY)
Admission: EM | Admit: 2023-03-03 | Discharge: 2023-03-03 | Disposition: A | Discharge status: Home / Self Care | Payer: Medicare HMO | Attending: Emergency Medicine | Admitting: Emergency Medicine

## 2023-03-03 ENCOUNTER — Other Ambulatory Visit: Payer: Self-pay

## 2023-03-03 ENCOUNTER — Emergency Department (HOSPITAL_COMMUNITY): Payer: Medicare HMO

## 2023-03-03 DIAGNOSIS — R0789 Other chest pain: Secondary | ICD-10-CM | POA: Insufficient documentation

## 2023-03-03 DIAGNOSIS — E119 Type 2 diabetes mellitus without complications: Secondary | ICD-10-CM | POA: Insufficient documentation

## 2023-03-03 DIAGNOSIS — R079 Chest pain, unspecified: Secondary | ICD-10-CM | POA: Diagnosis present

## 2023-03-03 DIAGNOSIS — Z79899 Other long term (current) drug therapy: Secondary | ICD-10-CM | POA: Diagnosis not present

## 2023-03-03 DIAGNOSIS — I10 Essential (primary) hypertension: Secondary | ICD-10-CM | POA: Insufficient documentation

## 2023-03-03 DIAGNOSIS — Z794 Long term (current) use of insulin: Secondary | ICD-10-CM | POA: Diagnosis not present

## 2023-03-03 LAB — COMPREHENSIVE METABOLIC PANEL
ALT: 23 U/L (ref 0–44)
AST: 23 U/L (ref 15–41)
Albumin: 3.8 g/dL (ref 3.5–5.0)
Alkaline Phosphatase: 89 U/L (ref 38–126)
Anion gap: 13 (ref 5–15)
BUN: 10 mg/dL (ref 8–23)
CO2: 23 mmol/L (ref 22–32)
Calcium: 9.5 mg/dL (ref 8.9–10.3)
Chloride: 97 mmol/L — ABNORMAL LOW (ref 98–111)
Creatinine, Ser: 0.64 mg/dL (ref 0.44–1.00)
GFR, Estimated: >60 mL/min (ref >60)
Glucose, Bld: 293 mg/dL — ABNORMAL HIGH (ref 70–99)
Potassium: 3.8 mmol/L (ref 3.5–5.1)
Sodium: 133 mmol/L — ABNORMAL LOW (ref 135–145)
Total Bilirubin: 0.9 mg/dL (ref 0.3–1.2)
Total Protein: 6.7 g/dL (ref 6.5–8.1)

## 2023-03-03 LAB — CBC WITH DIFFERENTIAL/PLATELET
Abs Immature Granulocytes: 0.03 10*3/uL (ref 0.00–0.07)
Basophils Absolute: 0.1 10*3/uL (ref 0.0–0.1)
Basophils Relative: 1 %
Eosinophils Absolute: 0.1 10*3/uL (ref 0.0–0.5)
Eosinophils Relative: 1 %
HCT: 45.3 % (ref 36.0–46.0)
Hemoglobin: 15.9 g/dL — ABNORMAL HIGH (ref 12.0–15.0)
Immature Granulocytes: 0 %
Lymphocytes Relative: 20 %
Lymphs Abs: 1.5 10*3/uL (ref 0.7–4.0)
MCH: 29 pg (ref 26.0–34.0)
MCHC: 35.1 g/dL (ref 30.0–36.0)
MCV: 82.5 fL (ref 80.0–100.0)
Monocytes Absolute: 0.5 10*3/uL (ref 0.1–1.0)
Monocytes Relative: 7 %
Neutro Abs: 5.4 10*3/uL (ref 1.7–7.7)
Neutrophils Relative %: 71 %
Platelets: 249 10*3/uL (ref 150–400)
RBC: 5.49 MIL/uL — ABNORMAL HIGH (ref 3.87–5.11)
RDW: 12.2 % (ref 11.5–15.5)
WBC: 7.5 10*3/uL (ref 4.0–10.5)
nRBC: 0 % (ref 0.0–0.2)

## 2023-03-03 LAB — TROPONIN I (HIGH SENSITIVITY)
Troponin I (High Sensitivity): 3 ng/L (ref <18)
Troponin I (High Sensitivity): 3 ng/L (ref <18)

## 2023-03-03 LAB — LIPASE, BLOOD: Lipase: 35 U/L (ref 11–51)

## 2023-03-03 MED ORDER — HYDROCHLOROTHIAZIDE 12.5 MG PO TABS: 12.5000 mg | ORAL_TABLET | Freq: Every day | ORAL | 0 refills | Status: DC

## 2023-03-03 NOTE — ED Triage Notes (Signed)
Patient presents to ed via [facility]  from home states she was attempting to prepare food and became frustrated , started having chest pain c/o slight sob  patient was given asa 325 mg and 1 NTG  per ems. States pain is now 3/10 after ntg. Patient is alert  ems states she was hyperventilating upon their arrival

## 2023-03-03 NOTE — ED Provider Notes (Signed)
[facility] Provider Note   CSN: [id number] Arrival date & time: 03/03/23  1023     History  Chief Complaint  Patient presents with   Chest Pain    Natasha Chandler is a 67 y.o. female.  Patient here after an episode of chest discomfort this morning.  She has been under a lot of stress and feels like this was anxiety related.  She has misplaced her hydrochlorothiazide.  She is not having any active chest pain or shortness of breath or weakness or numbness or chills.  No abdominal pain nausea vomiting diarrhea.  She has a history of diabetes, high cholesterol, hypertension.  The history is provided by the patient.       Home Medications Prior to Admission medications   Medication Sig Start Date End Date Taking? Authorizing Provider  hydrochlorothiazide (HYDRODIURIL) 12.5 MG tablet Take 1 tablet (12.5 mg total) by mouth daily. 03/03/23 04/02/23 Yes Breeley Bischof, DO  albuterol (PROVENTIL HFA;VENTOLIN HFA) 108 (90 Base) MCG/ACT inhaler Inhale 1-2 puffs into the lungs every 6 (six) hours as needed for wheezing or shortness of breath.  11/26/16   [provider]  amLODipine (NORVASC) 10 MG tablet Take 10 mg by mouth daily.    [provider]  budesonide-formoterol (SYMBICORT) 160-4.5 MCG/ACT inhaler Inhale 2 puffs into the lungs 2 (two) times daily.    [provider]  desvenlafaxine (PRISTIQ) 50 MG 24 hr tablet Take 50 mg by mouth daily.  08/13/17   [provider]  diclofenac (VOLTAREN) 50 MG EC tablet Take 50 mg by mouth 2 (two) times daily.    [provider]  empagliflozin (JARDIANCE) 25 MG TABS tablet Take by mouth. 04/22/19   [provider]  glucose blood (ONETOUCH VERIO) test strip Use as instructed to check blood sugars 2-3 times daily. 02/14/18   Elayne Snare, MD  Insulin Pen Needle 31G X 5 MM MISC Use to inject insulin once daily 01/25/17   Elayne Snare, MD  INVOKANA 300 MG TABS tablet  TAKE ONE TABLET BY MOUTH ONE TIME DAILY  before breakfast Patient not taking: Reported on 08/04/2020 11/28/18   Elayne Snare, MD  levothyroxine (SYNTHROID, LEVOTHROID) 25 MCG tablet TAKE ONE TABLET BY MOUTH ONE TIME DAILY BEFORE BREAKFAST Patient taking differently: Take 25 mcg by mouth daily before breakfast. TAKE ONE TABLET BY MOUTH ONE TIME DAILY BEFORE BREAKFAST 09/03/18   Elayne Snare, MD  losartan (COZAAR) 100 MG tablet Take 100 mg by mouth daily.    [provider]  metFORMIN (GLUCOPHAGE-XR) 500 MG 24 hr tablet TAKE TWO TABLETS BY MOUTH TWICE DAILY  Patient not taking: Reported on 08/04/2020 07/23/18   Elayne Snare, MD  montelukast (SINGULAIR) 10 MG tablet Take by mouth. 03/20/19   [provider]  NOVOLOG FLEXPEN 100 UNIT/ML FlexPen inject 12 units into the skin before meals three times a day 11/28/18   Elayne Snare, MD  [facility], 1 MG/DOSE, 2 MG/1.5ML SOPN INJECT 1MG  INTO THE SKIN ONCE A WEEK 11/28/18   Elayne Snare, MD  pioglitazone (ACTOS) 30 MG tablet Take by mouth. 07/24/19   [provider]  PREMARIN vaginal cream Place 1 Applicatorful vaginally daily.  Patient not taking: Reported on 08/04/2020 08/13/17   [provider]  Semaglutide, 2 MG/DOSE, (OZEMPIC, 2 MG/DOSE,) 8 MG/3ML SOPN Inject 0.75 mLs (2 mg total) into the skin every 7 days. 11/08/22     simvastatin (ZOCOR) 20 MG tablet Take 20 mg by  mouth daily.    [provider]  TRESIBA FLEXTOUCH 200 UNIT/ML SOPN INJECT 68 UNITS INTO THE SKIN ONCE A DAY 11/18/18   Elayne Snare, MD      Allergies    Bee venom, Oysters [shellfish allergy], Penicillins, Olive oil, Sulfa antibiotics, and Sulfasalazine    Review of Systems   Review of Systems  Physical Exam Updated Vital Signs BP (!) 133/59   Pulse 63   Temp 98.6 F (37 C) (Oral)   Resp 18   Ht 5' 1.5" (1.562 m)   Wt 82.6 kg   SpO2 100%   BMI 33.85 kg/m  Physical Exam Vitals and nursing note reviewed.  Constitutional:      General: She is not  in acute distress.    Appearance: She is well-developed.  HENT:     Head: Normocephalic and atraumatic.  Eyes:     Extraocular Movements: Extraocular movements intact.     Conjunctiva/sclera: Conjunctivae normal.     Pupils: Pupils are equal, round, and reactive to light.  Cardiovascular:     Rate and Rhythm: Normal rate and regular rhythm.     Pulses:          Radial pulses are 2+ on the right side and 2+ on the left side.     Heart sounds: Normal heart sounds. No murmur heard. Pulmonary:     Effort: Pulmonary effort is normal. No respiratory distress.     Breath sounds: Normal breath sounds. No decreased breath sounds, wheezing or rhonchi.  Abdominal:     Palpations: Abdomen is soft.     Tenderness: There is no abdominal tenderness.  Musculoskeletal:        General: No swelling. Normal range of motion.     Cervical back: Normal range of motion and neck supple.     Right lower leg: No edema.     Left lower leg: No edema.  Skin:    General: Skin is warm and dry.     Capillary Refill: Capillary refill takes less than 2 seconds.  Neurological:     General: No focal deficit present.     Mental Status: She is alert.  Psychiatric:        Mood and Affect: Mood normal.     ED Results / Procedures / Treatments   Labs (all labs ordered are listed, but only abnormal results are displayed) Labs Reviewed  CBC WITH DIFFERENTIAL/PLATELET - Abnormal; Notable for the following components:      Result Value   RBC 5.49 (*)    Hemoglobin 15.9 (*)    All other components within normal limits  COMPREHENSIVE METABOLIC PANEL - Abnormal; Notable for the following components:   Sodium 133 (*)    Chloride 97 (*)    Glucose, Bld 293 (*)    All other components within normal limits  LIPASE, BLOOD  TROPONIN I (HIGH SENSITIVITY)  TROPONIN I (HIGH SENSITIVITY)    EKG EKG Interpretation  Date/Time:  Saturday March 03 2023 10:36:49 EDT Ventricular Rate:  58 PR Interval:  162 QRS  Duration: 88 QT Interval:  474 QTC Calculation: 466 R Axis:   -11 Text Interpretation: Sinus rhythm Confirmed by Lennice Sites ([id number]) on 03/03/2023 10:41:18 AM  Radiology DG Chest Portable 1 View  Result Date: 03/03/2023 CLINICAL DATA:  Chest pain EXAM: PORTABLE CHEST 1 VIEW COMPARISON:  Chest radiograph dated 01/03/2017 FINDINGS: Normal lung volumes. No focal consolidations. No pleural effusion or pneumothorax. The heart size and mediastinal contours are within  normal limits. The visualized skeletal structures are unremarkable. IMPRESSION: No active disease. Electronically Signed   By: Darrin Nipper M.D.   On: 03/03/2023 12:24    Procedures Procedures    Medications Ordered in ED Medications - No data to display  ED Course/ Medical Decision Making/ A&P             HEART Score: 3                Medical Decision Making Amount and/or Complexity of Data Reviewed Labs: ordered. Radiology: ordered.  Risk Prescription drug management.   Laureen Ochs is here with chest pain.  Normal vitals.  No fever.  Overall very atypical discomfort.  She has not had any exertional chest pain.  She had short brief episode of chest discomfort when she is under some anxiety this morning.  She has a history of diabetes, high cholesterol, hypertension.  Overall she appears well.  Differential diagnosis likely stress related event but will evaluate for ACS, electrolyte abnormality, anemia, pneumonia.  Will get CBC, BMP, troponin, chest x-ray.  EKG shows sinus rhythm.  No ischemic changes.  Per my review and interpretation of labs no significant anemia or electrolyte abnormality or kidney injury.  Troponin negative x 2.  Chest x-ray shows no evidence of pneumonia or pneumothorax per my review and interpretation.  Overall she appears well.  Will have her follow-up with primary care doctor.  I have refilled her hydrochlorothiazide.  Discharged in good condition.  This chart was dictated using voice recognition  software.  Despite best efforts to proofread,  errors can occur which can change the documentation meaning.         Final Clinical Impression(s) / ED Diagnoses Final diagnoses:  Nonspecific chest pain    Rx / DC Orders ED Discharge Orders          Ordered    hydrochlorothiazide (HYDRODIURIL) 12.5 MG tablet  Daily        03/03/23 1221              Lennice Sites, DO 03/03/23 1352

## 2023-05-10 ENCOUNTER — Observation Stay (HOSPITAL_BASED_OUTPATIENT_CLINIC_OR_DEPARTMENT_OTHER)
Admission: EM | Admit: 2023-05-10 | Discharge: 2023-05-13 | Disposition: A | Discharge status: Home / Self Care | Payer: Medicare HMO | Attending: Internal Medicine | Admitting: Internal Medicine

## 2023-05-10 ENCOUNTER — Other Ambulatory Visit: Payer: Self-pay

## 2023-05-10 ENCOUNTER — Emergency Department (HOSPITAL_BASED_OUTPATIENT_CLINIC_OR_DEPARTMENT_OTHER): Payer: Medicare HMO

## 2023-05-10 ENCOUNTER — Encounter (HOSPITAL_BASED_OUTPATIENT_CLINIC_OR_DEPARTMENT_OTHER): Payer: Self-pay

## 2023-05-10 DIAGNOSIS — I10 Essential (primary) hypertension: Secondary | ICD-10-CM | POA: Insufficient documentation

## 2023-05-10 DIAGNOSIS — J45909 Unspecified asthma, uncomplicated: Secondary | ICD-10-CM | POA: Diagnosis not present

## 2023-05-10 DIAGNOSIS — I639 Cerebral infarction, unspecified: Principal | ICD-10-CM | POA: Insufficient documentation

## 2023-05-10 DIAGNOSIS — E7800 Pure hypercholesterolemia, unspecified: Secondary | ICD-10-CM | POA: Diagnosis present

## 2023-05-10 DIAGNOSIS — I6782 Cerebral ischemia: Secondary | ICD-10-CM | POA: Diagnosis not present

## 2023-05-10 DIAGNOSIS — R29810 Facial weakness: Secondary | ICD-10-CM | POA: Diagnosis present

## 2023-05-10 DIAGNOSIS — F0390 Unspecified dementia without behavioral disturbance: Secondary | ICD-10-CM | POA: Insufficient documentation

## 2023-05-10 DIAGNOSIS — E039 Hypothyroidism, unspecified: Secondary | ICD-10-CM | POA: Diagnosis not present

## 2023-05-10 DIAGNOSIS — R4781 Slurred speech: Secondary | ICD-10-CM | POA: Diagnosis not present

## 2023-05-10 DIAGNOSIS — E063 Autoimmune thyroiditis: Secondary | ICD-10-CM | POA: Diagnosis present

## 2023-05-10 DIAGNOSIS — E1165 Type 2 diabetes mellitus with hyperglycemia: Secondary | ICD-10-CM | POA: Diagnosis not present

## 2023-05-10 DIAGNOSIS — Z794 Long term (current) use of insulin: Secondary | ICD-10-CM | POA: Diagnosis not present

## 2023-05-10 DIAGNOSIS — Z79899 Other long term (current) drug therapy: Secondary | ICD-10-CM | POA: Insufficient documentation

## 2023-05-10 DIAGNOSIS — R471 Dysarthria and anarthria: Secondary | ICD-10-CM | POA: Diagnosis not present

## 2023-05-10 LAB — CBC WITH DIFFERENTIAL/PLATELET
Abs Immature Granulocytes: 0.04 10*3/uL (ref 0.00–0.07)
Basophils Absolute: 0.1 10*3/uL (ref 0.0–0.1)
Basophils Relative: 1 %
Eosinophils Absolute: 0.1 10*3/uL (ref 0.0–0.5)
Eosinophils Relative: 1 %
HCT: 41.5 % (ref 36.0–46.0)
Hemoglobin: 13.7 g/dL (ref 12.0–15.0)
Immature Granulocytes: 0 %
Lymphocytes Relative: 25 %
Lymphs Abs: 2.3 10*3/uL (ref 0.7–4.0)
MCH: 29 pg (ref 26.0–34.0)
MCHC: 33 g/dL (ref 30.0–36.0)
MCV: 87.9 fL (ref 80.0–100.0)
Monocytes Absolute: 0.9 10*3/uL (ref 0.1–1.0)
Monocytes Relative: 9 %
Neutro Abs: 5.9 10*3/uL (ref 1.7–7.7)
Neutrophils Relative %: 64 %
Platelets: 335 10*3/uL (ref 150–400)
RBC: 4.72 MIL/uL (ref 3.87–5.11)
RDW: 13.4 % (ref 11.5–15.5)
WBC: 9.4 10*3/uL (ref 4.0–10.5)
nRBC: 0 % (ref 0.0–0.2)

## 2023-05-10 LAB — URINALYSIS, ROUTINE W REFLEX MICROSCOPIC
Bilirubin Urine: NEGATIVE
Glucose, UA: NEGATIVE mg/dL
Hgb urine dipstick: NEGATIVE
Ketones, ur: NEGATIVE mg/dL
Leukocytes,Ua: NEGATIVE
Nitrite: NEGATIVE
Protein, ur: NEGATIVE mg/dL
Specific Gravity, Urine: >=1.03 (ref 1.005–1.030)
pH: 5.5 (ref 5.0–8.0)

## 2023-05-10 LAB — BASIC METABOLIC PANEL
Anion gap: 8 (ref 5–15)
BUN: 30 mg/dL — ABNORMAL HIGH (ref 8–23)
CO2: 25 mmol/L (ref 22–32)
Calcium: 9.1 mg/dL (ref 8.9–10.3)
Chloride: 103 mmol/L (ref 98–111)
Creatinine, Ser: 0.92 mg/dL (ref 0.44–1.00)
GFR, Estimated: >60 mL/min (ref >60)
Glucose, Bld: 146 mg/dL — ABNORMAL HIGH (ref 70–99)
Potassium: 3.8 mmol/L (ref 3.5–5.1)
Sodium: 136 mmol/L (ref 135–145)

## 2023-05-10 LAB — PROTIME-INR
INR: 1.1 (ref 0.8–1.2)
Prothrombin Time: 14.1 seconds (ref 11.4–15.2)

## 2023-05-10 LAB — CBG MONITORING, ED: Glucose-Capillary: 152 mg/dL — ABNORMAL HIGH (ref 70–99)

## 2023-05-10 MED ORDER — IOHEXOL 350 MG/ML SOLN
75.0000 mL | Freq: Once | INTRAVENOUS | Status: AC | PRN
  Administered 2023-05-10: 75 mL via INTRAVENOUS

## 2023-05-10 MED ORDER — ASPIRIN 81 MG PO CHEW
81.0000 mg | CHEWABLE_TABLET | Freq: Once | ORAL | Status: AC
  Administered 2023-05-10: 81 mg via ORAL
  Filled 2023-05-10: qty 1

## 2023-05-10 NOTE — ED Notes (Signed)
Patient's mouth/face continues to droop. Speech however, is not slurred.

## 2023-05-10 NOTE — ED Notes (Signed)
Patient has a slight droop in her face. Seems to resolve and then return. Patient able to smile and show all teeth. No labial fold abnormalities to note. Speech is normal per patient and sister. Sensory intact with no abnormalities.

## 2023-05-10 NOTE — ED Provider Notes (Signed)
[facility] Provider Note   CSN: [id number] Arrival date & time: 05/10/23  1846     History  CC: Slurred speech   Natasha Chandler is a 67 y.o. female with history hypertension, hyperlipidemia, diabetes, presented to ED with difficulty with speech, facial droop.  Patient reports symptom onset yesterday morning early afternoon.  She says she was on the phone with her doctor's office and abruptly began having difficulty speaking.  She noted a facial droop on her right side.  She denies numbness or weakness of her arms or legs.  She denies headache or blurred vision.  Her sister went to visit today and noted she had a facial droop and brought her into the ED.  Patient does see a neurologist and was diagnosed with potential early Alzheimer's dementia.  She had an MRI of her brain in September 2023, which showed per my review of external records, innumerable punctate foci of hemosiderin deposition within the cerebellum and cerebral findings; no report of evident infarct  HPI     Home Medications Prior to Admission medications   Medication Sig Start Date End Date Taking? Authorizing Provider  albuterol (PROVENTIL HFA;VENTOLIN HFA) 108 (90 Base) MCG/ACT inhaler Inhale 1-2 puffs into the lungs every 6 (six) hours as needed for wheezing or shortness of breath.  11/26/16  Yes [provider]  amLODipine (NORVASC) 10 MG tablet Take 10 mg by mouth daily.   Yes [provider]  budesonide-formoterol (SYMBICORT) 160-4.5 MCG/ACT inhaler Inhale 2 puffs into the lungs 2 (two) times daily.   Yes [provider]  Continuous Glucose Sensor (DEXCOM G7 SENSOR) MISC Inject 1 each into the skin See admin instructions. Inject 1 sensor to the skin every 10 days for continuous glucose monitoring. 01/05/23  Yes [provider]  diclofenac (VOLTAREN) 50 MG EC tablet Take 50 mg by mouth 2 (two) times daily.   Yes [provider]   donepezil (ARICEPT) 5 MG tablet Take 5 mg by mouth at bedtime. 03/01/23  Yes [provider]  fluticasone (FLONASE) 50 MCG/ACT nasal spray Place 2 sprays into both nostrils daily as needed for allergies. 04/12/23 04/11/24 Yes [provider]  hydrochlorothiazide (HYDRODIURIL) 12.5 MG tablet Take 1 tablet (12.5 mg total) by mouth daily. 03/03/23 05/11/23 Yes Curatolo, Adam, DO  Insulin Aspart FlexPen (NOVOLOG) 100 UNIT/ML Inject 20 Units into the skin 3 (three) times daily with meals. 01/12/23  Yes [provider]  insulin degludec (TRESIBA) 100 UNIT/ML FlexTouch Pen Inject 60 Units into the skin daily. 01/04/23  Yes [provider]  Insulin Pen Needle 31G X 5 MM MISC Use to inject insulin once daily 01/25/17  Yes Reather Littler, MD  levothyroxine (SYNTHROID) 50 MCG tablet Take 50 mcg by mouth daily before breakfast. 01/05/23  Yes [provider]  losartan (COZAAR) 100 MG tablet Take 100 mg by mouth daily.   Yes [provider]  montelukast (SINGULAIR) 10 MG tablet Take 10 mg by mouth daily. 03/20/19  Yes [provider]  pioglitazone (ACTOS) 45 MG tablet Take 45 mg by mouth daily. 01/05/23  Yes [provider]  rosuvastatin (CRESTOR) 20 MG tablet Take 20 mg by mouth daily. 03/16/23  Yes [provider]  Semaglutide, 2 MG/DOSE, (OZEMPIC, 2 MG/DOSE,) 8 MG/3ML SOPN Inject 0.75 mLs (2 mg total) into the skin every 7 days. Patient taking differently: Inject 2 mg into the skin once a week. Fridays 11/08/22  Yes   sertraline (  ZOLOFT) 50 MG tablet Take 50 mg by mouth daily.   Yes [provider]  Vitamin D, Ergocalciferol, (DRISDOL) 1.25 MG (50000 UNIT) CAPS capsule Take 50,000 Units by mouth once a week. Thursdays 03/01/23  Yes [provider]      Allergies    Bee venom, Oysters [shellfish allergy], Penicillins, Olive oil, Sulfa antibiotics, Sulfasalazine, and Tramadol    Review of Systems   Review of Systems  Physical  Exam Updated Vital Signs BP (!) 138/48   Pulse 66   Temp 97.9 F (36.6 C)   Resp 14   Ht 5\' 2"  (1.575 m)   Wt 75.3 kg   SpO2 100%   BMI 30.36 kg/m  Physical Exam Constitutional:      General: She is not in acute distress. HENT:     Head: Normocephalic and atraumatic.  Eyes:     Conjunctiva/sclera: Conjunctivae normal.     Pupils: Pupils are equal, round, and reactive to light.  Cardiovascular:     Rate and Rhythm: Normal rate and regular rhythm.  Pulmonary:     Effort: Pulmonary effort is normal. No respiratory distress.  Abdominal:     General: There is no distension.     Tenderness: There is no abdominal tenderness.  Skin:    General: Skin is warm and dry.  Neurological:     General: No focal deficit present.     Mental Status: She is alert. Mental status is at baseline.     Comments: Right-sided facial droop, speech is clear, no paresthesias reported in the right half of the face, cranial nerve exam otherwise unremarkable, extraocular muscles are spared Remainder of neurological exam is grossly intact; motor strength testing, coordination testing, sensory testing of the lower extremities are normal  Psychiatric:        Mood and Affect: Mood normal.        Behavior: Behavior normal.     ED Results / Procedures / Treatments   Labs (all labs ordered are listed, but only abnormal results are displayed) Labs Reviewed  BASIC METABOLIC PANEL - Abnormal; Notable for the following components:      Result Value   Glucose, Bld 146 (*)    BUN 30 (*)    All other components within normal limits  CBG MONITORING, ED - Abnormal; Notable for the following components:   Glucose-Capillary 152 (*)    All other components within normal limits  CBG MONITORING, ED - Abnormal; Notable for the following components:   Glucose-Capillary 115 (*)    All other components within normal limits  CBC WITH DIFFERENTIAL/PLATELET  PROTIME-INR  URINALYSIS, ROUTINE W REFLEX MICROSCOPIC     EKG EKG Interpretation  Date/Time:  Thursday May 10 2023 18:59:24 EDT Ventricular Rate:  81 PR Interval:  169 QRS Duration: 84 QT Interval:  402 QTC Calculation: 467 R Axis:   36 Text Interpretation: Sinus rhythm Confirmed by Alvester Chou [phone]) on 05/10/2023 9:42:03 PM  Radiology CT ANGIO HEAD NECK W WO CM  Result Date: 05/10/2023 CLINICAL DATA:  Initial evaluation for stroke. EXAM: CT ANGIOGRAPHY HEAD AND NECK WITH AND WITHOUT CONTRAST TECHNIQUE: Multidetector CT imaging of the head and neck was performed using the standard protocol during bolus administration of intravenous contrast. Multiplanar CT image reconstructions and MIPs were obtained to evaluate the vascular anatomy. Carotid stenosis measurements (when applicable) are obtained utilizing NASCET criteria, using the distal internal carotid diameter as the denominator. RADIATION DOSE REDUCTION: This exam was performed according to the departmental  dose-optimization program which includes automated exposure control, adjustment of the mA and/or kV according to patient size and/or use of iterative reconstruction technique. CONTRAST:  75mL OMNIPAQUE IOHEXOL 350 MG/ML SOLN COMPARISON:  Prior study from earlier the same day. FINDINGS: CTA NECK FINDINGS Aortic arch: Visualized aortic arch normal in caliber with standard branch pattern. No stenosis about the origin the great vessels. Right carotid system: Right common and internal carotid arteries are patent without dissection. Calcified atheromatous plaque about the proximal cervical right ICA with associated stenosis of up to 50% by NASCET criteria. Left carotid system: Left common and internal carotid arteries are patent without dissection. Mild atheromatous change about the left carotid bulb without stenosis. Vertebral arteries: Both vertebral arteries arise from subclavian arteries. No proximal subclavian artery stenosis. Vertebral arteries are patent without stenosis or dissection.  Skeleton: No worrisome osseous lesions. Mild for age cervical spondylosis. Other neck: Few thyroid nodules noted, largest of which measures 2.6 cm at the isthmus (series 8, image 24) This has been evaluated on previous imaging. (ref: J Am Coll Radiol. 2015 Feb;12(2): 143-50). Upper chest: No acute finding. Review of the MIP images confirms the above findings CTA HEAD FINDINGS Anterior circulation: Examination technically limited by poor timing of the contrast bolus. Both internal carotid arteries are patent through the siphons without stenosis or other abnormality. A1 segments, anterior communicating complex common anterior cerebral arteries patent without stenosis. No M1 stenosis or occlusion. No proximal MCA branch occlusion or high-grade stenosis. Distal MCA branches perfused and symmetric. Posterior circulation: Both V4 segments widely patent. Both PICA patent. Basilar widely patent without stenosis. Superior cerebellar and posterior cerebral arteries patent bilaterally. Venous sinuses: Patent allowing for timing the contrast bolus. Anatomic variants: None significant.  No aneurysm. Review of the MIP images confirms the above findings IMPRESSION: 1. Negative CTA for large vessel occlusion or other emergent finding. 2. Calcified atheromatous plaque about the proximal cervical right ICA with associated stenosis of up to 50% by NASCET criteria. Electronically Signed   By: Rise Mu M.D.   On: 05/10/2023 23:37   CT Head Wo Contrast  Result Date: 05/10/2023 CLINICAL DATA:  Facial paralysis/weakness and slurred speech EXAM: CT HEAD WITHOUT CONTRAST TECHNIQUE: Contiguous axial images were obtained from the base of the skull through the vertex without intravenous contrast. RADIATION DOSE REDUCTION: This exam was performed according to the departmental dose-optimization program which includes automated exposure control, adjustment of the mA and/or kV according to patient size and/or use of iterative  reconstruction technique. COMPARISON:  MRI head report from 08/11/2022 FINDINGS: Brain: Asymmetric hypoattenuation within the right insula (series 2/image 15). No comparison images are available and this may be chronic however is concerning for acute infarct. No intracranial hemorrhage or mass effect. No hydrocephalus. No extra-axial fluid collection. Generalized cerebral atrophy. Extensive ill-defined hypoattenuation within the cerebral white matter is nonspecific but consistent with chronic small vessel ischemic disease. Vascular: No hyperdense vessel. Intracranial arterial calcification. Skull: No fracture or focal lesion. Sinuses/Orbits: No acute finding. Paranasal sinuses and mastoid air cells are well aerated. Other: None. IMPRESSION: 1. Asymmetric hypoattenuation within the right insula. This is concerning for acute infarct. MRI is recommended for further evaluation. 2. Extensive chronic small vessel ischemic disease. Critical Value/emergent results were called by telephone at the time of interpretation on 05/10/2023 at 8:04 pm to provider Waymond Meador , who verbally acknowledged these results. Electronically Signed   By: Minerva Fester M.D.   On: 05/10/2023 20:10    Procedures Procedures  Medications Ordered in ED Medications  aspirin chewable tablet 81 mg (81 mg Oral Given 05/11/23 0905)  aspirin chewable tablet 81 mg (81 mg Oral Given 05/10/23 2140)  iohexol (OMNIPAQUE) 350 MG/ML injection 75 mL (75 mLs Intravenous Contrast Given 05/10/23 2214)    ED Course/ Medical Decision Making/ A&P Clinical Course as of 05/11/23 1004  Thu May 10, 2023  2120 Awaiting callback from neurologist [MT]  2135 Dr Amada Jupiter neurology by phone recommending aspirin, holding Plavix, CTA and admission to the hospital for stroke evaluation and MRI.  Patient was updated and in agreement with the plan.  She will be admitted [MT]  2142 Admitted to hospitalist [MT]  Fri May 11, 2023  4098 No bed availability. She has  been waiting for inpatient bed >12 hours. Will plan for ED to ED transfer to facilitate her inpatient stroke work up. Dr. Hyacinth Meeker accepts the patient for transfer. [VK]    Clinical Course User Index [MT] Dontee Jaso, Kermit Balo, MD [VK] Rexford Maus, DO                             Medical Decision Making Amount and/or Complexity of Data Reviewed Labs: ordered. Radiology: ordered. ECG/medicine tests: ordered.  Risk OTC drugs. Prescription drug management. Decision regarding hospitalization.   This patient presents to the ED with concern for facial droop, slurred speech. This involves an extensive number of treatment options, and is a complaint that carries with it a high risk of complications and morbidity.  The differential diagnosis includes CVA vs electrolyte derangement vs bell's palsy vs other  Co-morbidities that complicate the patient evaluation: CVA risk factors, diabetes, HTN, HLD  Additional history obtained from patient's sister  External records from outside source obtained and reviewed including MRI brain outpatient as noted above  I ordered and personally interpreted labs.  The pertinent results include:  no emergent findings  I ordered imaging studies including CT head, CTA head and neck I independently visualized and interpreted imaging which showed potential infarct right insula region, unspecific chronicity, microvascular disease, no LVO I agree with the radiologist interpretation  The patient was maintained on a cardiac monitor.  I personally viewed and interpreted the cardiac monitored which showed an underlying rhythm of: NSR  Per my interpretation the patient's ECG shows no acute ischemic findings  I ordered medication including aspirin for stroke prevention  I have reviewed the patients home medicines and have made adjustments as needed  Test Considered: doubt meningitis, no indication for LP at this time  I requested consultation with the neurology,   and discussed lab and imaging findings as well as pertinent plan - they recommend: see consult note  After the interventions noted above, I reevaluated the patient and found that they have: stayed the same  Speech is improved back to baseline, but facial droop persists  Dispostion:  After consideration of the diagnostic results and the patients response to treatment, I feel that the patent would benefit from inpatient evaluation, stroke workup, neurology consult.        Final Clinical Impression(s) / ED Diagnoses Final diagnoses:  Cerebrovascular accident (CVA), unspecified mechanism (HCC)  Facial droop    Rx / DC Orders ED Discharge Orders     None         Kaidynce Pfister, Kermit Balo, MD 05/11/23 1004

## 2023-05-10 NOTE — ED Triage Notes (Signed)
Pt states that yesterday she woke up and didn't feel right. States that she tried calling the doctor and then today she didn't feel right. Sister went to house to fix med box and noted pt had drooping to her face.

## 2023-05-11 ENCOUNTER — Observation Stay (HOSPITAL_COMMUNITY): Payer: Medicare HMO

## 2023-05-11 DIAGNOSIS — R29810 Facial weakness: Secondary | ICD-10-CM | POA: Diagnosis present

## 2023-05-11 DIAGNOSIS — E1165 Type 2 diabetes mellitus with hyperglycemia: Secondary | ICD-10-CM

## 2023-05-11 DIAGNOSIS — I639 Cerebral infarction, unspecified: Secondary | ICD-10-CM | POA: Diagnosis not present

## 2023-05-11 DIAGNOSIS — Z794 Long term (current) use of insulin: Secondary | ICD-10-CM

## 2023-05-11 DIAGNOSIS — F0390 Unspecified dementia without behavioral disturbance: Secondary | ICD-10-CM

## 2023-05-11 DIAGNOSIS — E063 Autoimmune thyroiditis: Secondary | ICD-10-CM

## 2023-05-11 DIAGNOSIS — I159 Secondary hypertension, unspecified: Secondary | ICD-10-CM | POA: Diagnosis not present

## 2023-05-11 DIAGNOSIS — E039 Hypothyroidism, unspecified: Secondary | ICD-10-CM | POA: Diagnosis not present

## 2023-05-11 DIAGNOSIS — J45909 Unspecified asthma, uncomplicated: Secondary | ICD-10-CM

## 2023-05-11 DIAGNOSIS — I6782 Cerebral ischemia: Secondary | ICD-10-CM | POA: Diagnosis not present

## 2023-05-11 DIAGNOSIS — Z79899 Other long term (current) drug therapy: Secondary | ICD-10-CM | POA: Diagnosis not present

## 2023-05-11 DIAGNOSIS — E7800 Pure hypercholesterolemia, unspecified: Secondary | ICD-10-CM

## 2023-05-11 DIAGNOSIS — I10 Essential (primary) hypertension: Secondary | ICD-10-CM | POA: Diagnosis not present

## 2023-05-11 LAB — CBG MONITORING, ED
Glucose-Capillary: 115 mg/dL — ABNORMAL HIGH (ref 70–99)
Glucose-Capillary: 175 mg/dL — ABNORMAL HIGH (ref 70–99)

## 2023-05-11 LAB — LIPID PANEL
Cholesterol: 135 mg/dL (ref 0–200)
HDL: 26 mg/dL — ABNORMAL LOW (ref >40)
LDL Cholesterol: 89 mg/dL (ref 0–99)
Total CHOL/HDL Ratio: 5.2 RATIO
Triglycerides: 99 mg/dL (ref <150)
VLDL: 20 mg/dL (ref 0–40)

## 2023-05-11 LAB — GLUCOSE, CAPILLARY
Glucose-Capillary: 80 mg/dL (ref 70–99)
Glucose-Capillary: 138 mg/dL — ABNORMAL HIGH (ref 70–99)

## 2023-05-11 LAB — TSH: TSH: 2.645 u[IU]/mL (ref 0.350–4.500)

## 2023-05-11 LAB — HEMOGLOBIN A1C
Hgb A1c MFr Bld: 7.4 % — ABNORMAL HIGH (ref 4.8–5.6)
Mean Plasma Glucose: 165.68 mg/dL

## 2023-05-11 MED ORDER — LOSARTAN POTASSIUM 50 MG PO TABS
100.0000 mg | ORAL_TABLET | Freq: Every day | ORAL | Status: DC
  Administered 2023-05-11: 100 mg via ORAL
  Filled 2023-05-11: qty 4

## 2023-05-11 MED ORDER — AMLODIPINE BESYLATE 10 MG PO TABS
10.0000 mg | ORAL_TABLET | Freq: Every day | ORAL | Status: DC
  Administered 2023-05-11: 10 mg via ORAL
  Filled 2023-05-11: qty 2

## 2023-05-11 MED ORDER — PIOGLITAZONE HCL 45 MG PO TABS: 45.0000 mg | ORAL_TABLET | Freq: Every day | ORAL | Status: DC

## 2023-05-11 MED ORDER — DONEPEZIL HCL 5 MG PO TABS
5.0000 mg | ORAL_TABLET | Freq: Every day | ORAL | Status: DC
  Administered 2023-05-11 – 2023-05-12 (×2): 5 mg via ORAL
  Filled 2023-05-11 (×3): qty 1

## 2023-05-11 MED ORDER — INSULIN DEGLUDEC 100 UNIT/ML ~~LOC~~ SOPN: 60.0000 [IU] | PEN_INJECTOR | Freq: Every day | SUBCUTANEOUS | Status: DC

## 2023-05-11 MED ORDER — ALBUTEROL SULFATE HFA 108 (90 BASE) MCG/ACT IN AERS: 1.0000 | INHALATION_SPRAY | Freq: Four times a day (QID) | RESPIRATORY_TRACT | Status: DC | PRN

## 2023-05-11 MED ORDER — ACETAMINOPHEN 325 MG PO TABS: 650.0000 mg | ORAL_TABLET | ORAL | Status: DC | PRN

## 2023-05-11 MED ORDER — INSULIN GLARGINE-YFGN 100 UNIT/ML ~~LOC~~ SOLN
60.0000 [IU] | Freq: Every day | SUBCUTANEOUS | Status: DC
  Administered 2023-05-11: 60 [IU] via SUBCUTANEOUS
  Filled 2023-05-11: qty 600

## 2023-05-11 MED ORDER — MOMETASONE FURO-FORMOTEROL FUM 200-5 MCG/ACT IN AERO
2.0000 | INHALATION_SPRAY | Freq: Two times a day (BID) | RESPIRATORY_TRACT | Status: DC
  Administered 2023-05-12 – 2023-05-13 (×2): 2 via RESPIRATORY_TRACT
  Filled 2023-05-11 (×2): qty 8.8

## 2023-05-11 MED ORDER — MONTELUKAST SODIUM 10 MG PO TABS
10.0000 mg | ORAL_TABLET | Freq: Every day | ORAL | Status: DC
  Administered 2023-05-12 – 2023-05-13 (×2): 10 mg via ORAL
  Filled 2023-05-11 (×2): qty 1

## 2023-05-11 MED ORDER — INSULIN GLARGINE-YFGN 100 UNIT/ML ~~LOC~~ SOLN
40.0000 [IU] | Freq: Every day | SUBCUTANEOUS | Status: DC
  Filled 2023-05-11: qty 0.4

## 2023-05-11 MED ORDER — ASPIRIN 81 MG PO CHEW
81.0000 mg | CHEWABLE_TABLET | Freq: Every day | ORAL | Status: DC
  Administered 2023-05-11 – 2023-05-13 (×3): 81 mg via ORAL
  Filled 2023-05-11 (×3): qty 1

## 2023-05-11 MED ORDER — SENNOSIDES-DOCUSATE SODIUM 8.6-50 MG PO TABS: 1.0000 | ORAL_TABLET | Freq: Every evening | ORAL | Status: DC | PRN

## 2023-05-11 MED ORDER — SODIUM CHLORIDE 0.9 % IV SOLN: INTRAVENOUS | Status: AC

## 2023-05-11 MED ORDER — INSULIN ASPART 100 UNIT/ML IJ SOLN: 0.0000 [IU] | Freq: Every day | INTRAMUSCULAR | Status: DC

## 2023-05-11 MED ORDER — STROKE: EARLY STAGES OF RECOVERY BOOK
Freq: Once | Status: AC
  Filled 2023-05-11: qty 1

## 2023-05-11 MED ORDER — INSULIN ASPART 100 UNIT/ML IJ SOLN
0.0000 [IU] | Freq: Three times a day (TID) | INTRAMUSCULAR | Status: DC
  Administered 2023-05-12: 3 [IU] via SUBCUTANEOUS

## 2023-05-11 MED ORDER — ENOXAPARIN SODIUM 40 MG/0.4ML IJ SOSY
40.0000 mg | PREFILLED_SYRINGE | INTRAMUSCULAR | Status: DC
  Administered 2023-05-12: 40 mg via SUBCUTANEOUS
  Filled 2023-05-11 (×3): qty 0.4

## 2023-05-11 MED ORDER — SERTRALINE HCL 50 MG PO TABS
50.0000 mg | ORAL_TABLET | Freq: Every day | ORAL | Status: DC
  Administered 2023-05-11 – 2023-05-13 (×3): 50 mg via ORAL
  Filled 2023-05-11 (×2): qty 1
  Filled 2023-05-11: qty 2

## 2023-05-11 MED ORDER — LEVOTHYROXINE SODIUM 50 MCG PO TABS
50.0000 ug | ORAL_TABLET | Freq: Every day | ORAL | Status: DC
  Administered 2023-05-12 – 2023-05-13 (×2): 50 ug via ORAL
  Filled 2023-05-11 (×2): qty 1

## 2023-05-11 MED ORDER — FLUTICASONE PROPIONATE 50 MCG/ACT NA SUSP: 2.0000 | Freq: Every day | NASAL | Status: DC | PRN

## 2023-05-11 MED ORDER — ACETAMINOPHEN 160 MG/5ML PO SOLN: 650.0000 mg | ORAL | Status: DC | PRN

## 2023-05-11 MED ORDER — ROSUVASTATIN CALCIUM 20 MG PO TABS
20.0000 mg | ORAL_TABLET | Freq: Every day | ORAL | Status: DC
  Administered 2023-05-12 – 2023-05-13 (×2): 20 mg via ORAL
  Filled 2023-05-11 (×2): qty 1

## 2023-05-11 MED ORDER — HYDROCHLOROTHIAZIDE 12.5 MG PO TABS
12.5000 mg | ORAL_TABLET | Freq: Every day | ORAL | Status: DC
  Administered 2023-05-11: 12.5 mg via ORAL
  Filled 2023-05-11: qty 1

## 2023-05-11 MED ORDER — ACETAMINOPHEN 650 MG RE SUPP: 650.0000 mg | RECTAL | Status: DC | PRN

## 2023-05-11 MED ORDER — SODIUM CHLORIDE 0.9 % IV SOLN: INTRAVENOUS | Status: DC

## 2023-05-11 MED ORDER — DICLOFENAC SODIUM 50 MG PO TBEC: 50.0000 mg | DELAYED_RELEASE_TABLET | Freq: Two times a day (BID) | ORAL | Status: DC

## 2023-05-11 NOTE — ED Provider Notes (Signed)
  Physical Exam  BP (!) 145/55   Pulse 68   Temp 98.2 F (36.8 C) (Oral)   Resp (!) 21   Ht 5\' 2"  (1.575 m)   Wt 75.3 kg   SpO2 100%   BMI 30.36 kg/m   Physical Exam Vitals and nursing note reviewed.  Constitutional:      General: She is not in acute distress.    Appearance: Normal appearance.  HENT:     Head: Normocephalic and atraumatic.     Nose: Nose normal.     Mouth/Throat:     Mouth: Mucous membranes are moist.  Eyes:     Extraocular Movements: Extraocular movements intact.     Conjunctiva/sclera: Conjunctivae normal.  Cardiovascular:     Rate and Rhythm: Normal rate.  Pulmonary:     Effort: Pulmonary effort is normal.  Abdominal:     General: Abdomen is flat.  Musculoskeletal:        General: Normal range of motion.     Cervical back: Normal range of motion.  Skin:    General: Skin is warm and dry.  Neurological:     Mental Status: She is alert and oriented to person, place, and time.     Comments: Right-sided facial droop No drift in all 4 extremities Sensation intact in all 4 extremities Normal finger-to-nose bilaterally Normal speech     Procedures  Procedures  ED Course / MDM   Clinical Course as of 05/11/23 0945  Thu May 10, 2023  2120 Awaiting callback from neurologist [MT]  2135 Dr Amada Jupiter neurology by phone recommending aspirin, holding Plavix, CTA and admission to the hospital for stroke evaluation and MRI.  Patient was updated and in agreement with the plan.  She will be admitted [MT]  2142 Admitted to hospitalist [MT]  Fri May 11, 2023  1610 No bed availability. She has been waiting for inpatient bed >12 hours. Will plan for ED to ED transfer to facilitate her inpatient stroke work up. Dr. Hyacinth Meeker accepts the patient for transfer. [VK]    Clinical Course User Index [MT] Trifan, Kermit Balo, MD [VK] Rexford Maus, DO   Medical Decision Making Patient initially presented to the emergency department yesterday with right-sided  facial droop and speech disturbance.  She had workup performed concerning for possible stroke and is planned for admission for MRI and further workup.  The patient is pending a bed at this time.  She is well-appearing this morning in no acute distress.  She continues to have the right-sided facial droop though does have some improvement of her speech.  No new neurologic deficits.  Will plan to reach out to bedboard to determine when she may be getting a bed and if there is a prolonged delay likely will have a ED to ED transfer to be able to obtain her MRI and further stroke workup.  Amount and/or Complexity of Data Reviewed Labs: ordered. Radiology: ordered. ECG/medicine tests: ordered.  Risk OTC drugs. Prescription drug management. Decision regarding hospitalization.         Rexford Maus, DO 05/11/23 [phone]

## 2023-05-11 NOTE — ED Notes (Signed)
[facility] at bedside 

## 2023-05-11 NOTE — ED Notes (Signed)
Introduced myself to pt gave her fresh water, pt [id number] able to ambulate by herself to [facility]
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
## 2023-05-11 NOTE — ED Notes (Signed)
Pt's CBG 175. Rn notified.

## 2023-05-12 ENCOUNTER — Observation Stay (HOSPITAL_BASED_OUTPATIENT_CLINIC_OR_DEPARTMENT_OTHER): Payer: Medicare HMO

## 2023-05-12 ENCOUNTER — Observation Stay (HOSPITAL_COMMUNITY): Payer: Medicare HMO

## 2023-05-12 DIAGNOSIS — I6389 Other cerebral infarction: Secondary | ICD-10-CM | POA: Diagnosis not present

## 2023-05-12 DIAGNOSIS — I63532 Cerebral infarction due to unspecified occlusion or stenosis of left posterior cerebral artery: Secondary | ICD-10-CM | POA: Diagnosis not present

## 2023-05-12 DIAGNOSIS — I680 Cerebral amyloid angiopathy: Secondary | ICD-10-CM

## 2023-05-12 DIAGNOSIS — I159 Secondary hypertension, unspecified: Secondary | ICD-10-CM | POA: Diagnosis not present

## 2023-05-12 DIAGNOSIS — J45909 Unspecified asthma, uncomplicated: Secondary | ICD-10-CM | POA: Diagnosis not present

## 2023-05-12 DIAGNOSIS — I639 Cerebral infarction, unspecified: Secondary | ICD-10-CM | POA: Diagnosis not present

## 2023-05-12 DIAGNOSIS — R29810 Facial weakness: Secondary | ICD-10-CM | POA: Diagnosis not present

## 2023-05-12 LAB — BASIC METABOLIC PANEL
Anion gap: 11 (ref 5–15)
BUN: 16 mg/dL (ref 8–23)
CO2: 26 mmol/L (ref 22–32)
Calcium: 9 mg/dL (ref 8.9–10.3)
Chloride: 101 mmol/L (ref 98–111)
Creatinine, Ser: 0.65 mg/dL (ref 0.44–1.00)
GFR, Estimated: >60 mL/min (ref >60)
Glucose, Bld: 78 mg/dL (ref 70–99)
Potassium: 3.5 mmol/L (ref 3.5–5.1)
Sodium: 138 mmol/L (ref 135–145)

## 2023-05-12 LAB — GLUCOSE, CAPILLARY
Glucose-Capillary: 62 mg/dL — ABNORMAL LOW (ref 70–99)
Glucose-Capillary: 82 mg/dL (ref 70–99)
Glucose-Capillary: 112 mg/dL — ABNORMAL HIGH (ref 70–99)
Glucose-Capillary: 183 mg/dL — ABNORMAL HIGH (ref 70–99)
Glucose-Capillary: 120 mg/dL — ABNORMAL HIGH (ref 70–99)

## 2023-05-12 LAB — ECHOCARDIOGRAM COMPLETE
AR max vel: 2.62 cm2
AV Area VTI: 2.22 cm2
AV Area mean vel: 2.28 cm2
AV Mean grad: 3 mmHg
AV Peak grad: 4.3 mmHg
Ao pk vel: 1.04 m/s
Area-P 1/2: 2.9 cm2
Height: 62 in
S' Lateral: 2.5 cm
Weight: 2656 oz

## 2023-05-12 LAB — CBC
HCT: 40.7 % (ref 36.0–46.0)
Hemoglobin: 13.6 g/dL (ref 12.0–15.0)
MCH: 29.2 pg (ref 26.0–34.0)
MCHC: 33.4 g/dL (ref 30.0–36.0)
MCV: 87.5 fL (ref 80.0–100.0)
Platelets: 301 10*3/uL (ref 150–400)
RBC: 4.65 MIL/uL (ref 3.87–5.11)
RDW: 13.4 % (ref 11.5–15.5)
WBC: 8.5 10*3/uL (ref 4.0–10.5)
nRBC: 0 % (ref 0.0–0.2)

## 2023-05-12 LAB — HIV ANTIBODY (ROUTINE TESTING W REFLEX): HIV Screen 4th Generation wRfx: NONREACTIVE

## 2023-05-12 MED ORDER — POTASSIUM CHLORIDE CRYS ER 10 MEQ PO TBCR
40.0000 meq | EXTENDED_RELEASE_TABLET | Freq: Once | ORAL | Status: AC
  Administered 2023-05-12: 40 meq via ORAL
  Filled 2023-05-12: qty 4

## 2023-05-12 MED ORDER — INSULIN GLARGINE-YFGN 100 UNIT/ML ~~LOC~~ SOLN
10.0000 [IU] | Freq: Every day | SUBCUTANEOUS | Status: DC
  Administered 2023-05-12 – 2023-05-13 (×2): 10 [IU] via SUBCUTANEOUS
  Filled 2023-05-12 (×2): qty 0.1

## 2023-05-12 MED ORDER — POTASSIUM CHLORIDE CRYS ER 20 MEQ PO TBCR: 40.0000 meq | EXTENDED_RELEASE_TABLET | Freq: Once | ORAL | Status: DC

## 2023-05-12 MED ORDER — PERFLUTREN LIPID MICROSPHERE
1.0000 mL | INTRAVENOUS | Status: AC | PRN
  Administered 2023-05-12: 2 mL via INTRAVENOUS

## 2023-05-12 MED ORDER — SODIUM CHLORIDE 0.9 % IV SOLN: INTRAVENOUS | Status: DC

## 2023-05-12 NOTE — Evaluation (Signed)
Occupational Therapy Evaluation Patient Details Name: Natasha Chandler MRN: [id number] DOB: 1955-12-21 Today's Date: 05/12/2023   History of Present Illness Pt is a 67 y.o. female presenting 05/10/2023 with difficulty with speech and R facial droop. CT scan of the head noted asymmetric hypoattenuation within the right insula concerning for acute infarct. 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 PMH significant for DM2, HTN, HLD, Vit D deficiency, potential early Alzheimers dementia.   Clinical Impression   PTA, pt reports she lived alone and sister assisted with medication management and finances. Upon eval, pt with poor delayed memory recall, problem solving, and executive function. Pt failing short blessed test. Overall performing ADL with supervision. Pt will benefit from continued OT services to optimize safety and independence in ADL.      Recommendations for follow up therapy are one component of a multi-disciplinary discharge planning process, led by the attending physician.  Recommendations may be updated based on patient status, additional functional criteria and insurance authorization.   Assistance Recommended at Discharge Intermittent Supervision/Assistance  Patient can return home with the following A little help with walking and/or transfers;A little help with bathing/dressing/bathroom;Help with stairs or ramp for entrance;Assist for transportation;Direct supervision/assist for financial management;Direct supervision/assist for medications management;Assistance with cooking/housework    Functional Status Assessment  Patient has had a recent decline in their functional status and demonstrates the ability to make significant improvements in function in a reasonable and predictable amount of time.  Equipment Recommendations  BSC/3in1    Recommendations for Other Services       Precautions / Restrictions Precautions Precautions: Fall (low) Precaution  Comments: hx of vertigo Restrictions Weight Bearing Restrictions: No      Mobility Bed Mobility Overal bed mobility: Modified Independent                  Transfers Overall transfer level: Needs assistance Equipment used: Rolling walker (2 wheels) Transfers: Sit to/from Stand Sit to Stand: Supervision                  Balance Overall balance assessment: Mild deficits observed, not formally tested                                         ADL either performed or assessed with clinical judgement   ADL Overall ADL's : Needs assistance/impaired Eating/Feeding: Independent   Grooming: Supervision/safety   Upper Body Bathing: Set up   Lower Body Bathing: Supervison/ safety   Upper Body Dressing : Set up   Lower Body Dressing: Supervision/safety   Toilet Transfer: Supervision/safety;Ambulation   Toileting- Clothing Manipulation and Hygiene: Supervision/safety       Functional mobility during ADLs: Supervision/safety General ADL Comments: Supervision. questionable safety awareness, but no overt LOB     Vision Baseline Vision/History: 0 No visual deficits;1 Wears glasses Ability to See in Adequate Light: 0 Adequate Patient Visual Report: No change from baseline Vision Assessment?: No apparent visual deficits     Perception     Praxis      Pertinent Vitals/Pain Pain Assessment Pain Assessment: No/denies pain     Hand Dominance Right   Extremity/Trunk Assessment Upper Extremity Assessment Upper Extremity Assessment: Generalized weakness   Lower Extremity Assessment Lower Extremity Assessment: Defer to PT evaluation   Cervical / Trunk Assessment Cervical / Trunk Assessment: Normal   Communication Communication Communication: Expressive difficulties ("every once  in a while" and slurred speech)   Cognition Arousal/Alertness: Awake/alert Behavior During Therapy: WFL for tasks assessed/performed Overall Cognitive Status:  History of cognitive impairments - at baseline                                 General Comments: Per chart, questionable cognitive deficits at baseline but pt reports she passed a recent test for dementia but has x2 remaining. Recalls information from past, but tangential speech, needing redirecting often. Poor problem solving and memory during session. Likely to benefit from OP OT to teach compensatory technqies.     General Comments       Exercises     Shoulder Instructions      Home Living Family/patient expects to be discharged to:: Private residence Living Arrangements: Alone Available Help at Discharge: Family;Available 24 hours/day Type of Home: Apartment (condo) Home Access: Stairs to enter [employer] of Steps: 15 Entrance Stairs-Rails: Right Home Layout: One level     Bathroom Shower/Tub: Chief Strategy Officer: Standard Bathroom Accessibility: Yes (tight)   Home Equipment: Rollator (4 wheels);Rolling Walker (2 wheels);Grab bars - tub/shower (not heavy not heavy duty grab bars tho)      Lives With: Alone    Prior Functioning/Environment Prior Level of Function : Needs assist;Driving             Mobility Comments: Mod I with no AD, x1-2 falls in past 6 months ADLs Comments: Sister assists with set-up of pill box, pt endorses x1 mistake in the past when sister did not supervise; otherwise pt independent with ADLs and does her own cooking; sister occasionally cleans; drives but reports her family does not like her driving. Sister supervising finances recently and making sure all bills are payed etc.        OT Problem List: Decreased strength;Decreased activity tolerance;Impaired balance (sitting and/or standing);Decreased cognition;Decreased safety awareness;Decreased knowledge of use of DME or AE      OT Treatment/Interventions: Self-care/ADL training;Therapeutic exercise;DME and/or AE instruction;Patient/family  education;Balance training;Therapeutic activities;Cognitive remediation/compensation    OT Goals(Current goals can be found in the care plan section) Acute Rehab OT Goals Patient Stated Goal: go home OT Goal Formulation: With patient Time For Goal Achievement: 05/26/23 Potential to Achieve Goals: Good  OT Frequency: Min 2X/week    Co-evaluation              AM-PAC OT "6 Clicks" Daily Activity     Outcome Measure Help from another person eating meals?: None Help from another person taking care of personal grooming?: A Little Help from another person toileting, which includes using toliet, bedpan, or urinal?: A Little Help from another person bathing (including washing, rinsing, drying)?: A Little Help from another person to put on and taking off regular upper body clothing?: A Little Help from another person to put on and taking off regular lower body clothing?: A Little 6 Click Score: 19   End of Session Equipment Utilized During Treatment: Gait belt Nurse Communication: Mobility status  Activity Tolerance: Patient tolerated treatment well Patient left: in bed;with call bell/phone within reach;with bed alarm set  OT Visit Diagnosis: Unsteadiness on feet (R26.81);Muscle weakness (generalized) (M62.81);Other symptoms and signs involving cognitive function                Time: 1703-1720 OT Time Calculation (min): 17 min Charges:  OT General Charges $OT Visit: 1 Visit OT Evaluation $OT Eval Low  Complexity: 1 Low  Tyler Deis, OTR/L [facility] Acute Rehabilitation Office: [phone]   Natasha Chandler 05/12/2023, 6:13 PM

## 2023-05-12 NOTE — Progress Notes (Signed)
STROKE TEAM PROGRESS NOTE   SUBJECTIVE (INTERVAL HISTORY) Her RN is at the bedside.  Overall her condition is rapidly improving.  Patient still has mild right facial droop.  MRI showed pattern of CAA, patient does have significant family history of Alexander disease and recently diagnosed with MCI.   OBJECTIVE Temp:  [97.7 F (36.5 C)-98.3 F (36.8 C)] 98 F (36.7 C) (06/08 1206) Pulse Rate:  [63-75] 63 (06/08 1206) Cardiac Rhythm: Normal sinus rhythm (06/08 0721) Resp:  [11-18] 17 (06/08 1206) BP: (111-147)/(44-66) 127/44 (06/08 1206) SpO2:  [99 %-100 %] 99 % (06/08 1206)  Recent Labs  Lab 05/11/23 1642 05/11/23 2115 05/12/23 0625 05/12/23 0649 05/12/23 1206  GLUCAP 80 138* 62* 82 112*   Recent Labs  Lab 05/10/23 2000 05/12/23 0320  NA 136 138  K 3.8 3.5  CL 103 101  CO2 25 26  GLUCOSE 146* 78  BUN 30* 16  CREATININE 0.92 0.65  CALCIUM 9.1 9.0   No results for input(s): "AST", "ALT", "ALKPHOS", "BILITOT", "PROT", "ALBUMIN" in the last 168 hours. Recent Labs  Lab 05/10/23 2000 05/12/23 0320  WBC 9.4 8.5  NEUTROABS 5.9  --   HGB 13.7 13.6  HCT 41.5 40.7  MCV 87.9 87.5  PLT 335 301   No results for input(s): "CKTOTAL", "CKMB", "CKMBINDEX", "TROPONINI" in the last 168 hours. Recent Labs    05/10/23 2000  LABPROT 14.1  INR 1.1   Recent Labs    05/10/23 2000  COLORURINE YELLOW  LABSPEC >=1.030  PHURINE 5.5  GLUCOSEU NEGATIVE  HGBUR NEGATIVE  BILIRUBINUR NEGATIVE  KETONESUR NEGATIVE  PROTEINUR NEGATIVE  NITRITE NEGATIVE  LEUKOCYTESUR NEGATIVE       Component Value Date/Time   CHOL 135 05/11/2023 1653   TRIG 99 05/11/2023 1653   HDL 26 (L) 05/11/2023 1653   CHOLHDL 5.2 05/11/2023 1653   VLDL 20 05/11/2023 1653   LDLCALC 89 05/11/2023 1653   Lab Results  Component Value Date   HGBA1C 7.4 (H) 05/10/2023   No results found for: "LABOPIA", "COCAINSCRNUR", "LABBENZ", "AMPHETMU", "THCU", "LABBARB"  No results for input(s): "ETH" in the last 168  hours.  I have personally reviewed the radiological images below and agree with the radiology interpretations.  ECHOCARDIOGRAM COMPLETE  Result Date: 05/12/2023    ECHOCARDIOGRAM REPORT   Patient Name:   KEERTI ROCKER Date of Exam: 05/12/2023 Medical Rec #:  [id number]    Height:       62.0 in Accession #:    [id number]   Weight:       166.0 lb Date of Birth:  [birth year]   BSA:          1.766 m Patient Age:    67 years     BP:           126/47 mmHg Patient Gender: F            HR:           71 bpm. Exam Location:  Inpatient Procedure: 2D Echo, Cardiac Doppler and Color Doppler Indications:    Stroke  History:        Patient has no prior history of Echocardiogram examinations.                 Risk Factors:Diabetes and Hypertension.  Sonographer:    Lucy Antigua Referring Phys: [phone] RONDELL A SMITH IMPRESSIONS  1. Left ventricular ejection fraction, by estimation, is 60 to 65%. The left ventricle has normal function. The left  ventricle has no regional wall motion abnormalities. There is mild left ventricular hypertrophy. Left ventricular diastolic parameters are indeterminate.  2. Right ventricular systolic function was not well visualized. The right ventricular size is not well visualized. Tricuspid regurgitation signal is inadequate for assessing PA pressure.  3. The mitral valve is normal in structure. No evidence of mitral valve regurgitation. No evidence of mitral stenosis.  4. The aortic valve has an indeterminant number of cusps. Aortic valve regurgitation is trivial. No aortic stenosis is present. FINDINGS  Left Ventricle: Left ventricular ejection fraction, by estimation, is 60 to 65%. The left ventricle has normal function. The left ventricle has no regional wall motion abnormalities. The left ventricular internal cavity size was normal in size. There is  mild left ventricular hypertrophy. Left ventricular diastolic parameters are indeterminate. Right Ventricle: The right ventricular size is not well  visualized. Right vetricular wall thickness was not well visualized. Right ventricular systolic function was not well visualized. Tricuspid regurgitation signal is inadequate for assessing PA pressure. Left Atrium: Left atrial size was not well visualized. Right Atrium: Right atrial size was not well visualized. Pericardium: The pericardium was not well visualized. Mitral Valve: The mitral valve is normal in structure. No evidence of mitral valve regurgitation. No evidence of mitral valve stenosis. Tricuspid Valve: The tricuspid valve is not well visualized. Tricuspid valve regurgitation is trivial. No evidence of tricuspid stenosis. Aortic Valve: The aortic valve has an indeterminant number of cusps. Aortic valve regurgitation is trivial. No aortic stenosis is present. Aortic valve mean gradient measures 3.0 mmHg. Aortic valve peak gradient measures 4.3 mmHg. Aortic valve area, by VTI measures 2.22 cm. Pulmonic Valve: The pulmonic valve was not well visualized. Pulmonic valve regurgitation is not visualized. No evidence of pulmonic stenosis. Aorta: The aortic root and ascending aorta are structurally normal, with no evidence of dilitation. Venous: The inferior vena cava was not well visualized. IAS/Shunts: The interatrial septum was not well visualized.  LEFT VENTRICLE PLAX 2D LVIDd:         3.70 cm   Diastology LVIDs:         2.50 cm   LV e' medial:    6.42 cm/s LV PW:         1.10 cm   LV E/e' medial:  15.7 LV IVS:        1.10 cm   LV e' lateral:   7.94 cm/s LVOT diam:     1.80 cm   LV E/e' lateral: 12.7 LV SV:         58 LV SV Index:   33 LVOT Area:     2.54 cm  LEFT ATRIUM           Index LA Vol (A4C): 35.7 ml 20.21 ml/m  AORTIC VALVE AV Area (Vmax):    2.62 cm AV Area (Vmean):   2.28 cm AV Area (VTI):     2.22 cm AV Vmax:           104.00 cm/s AV Vmean:          82.200 cm/s AV VTI:            0.260 m AV Peak Grad:      4.3 mmHg AV Mean Grad:      3.0 mmHg LVOT Vmax:         107.00 cm/s LVOT Vmean:         73.700 cm/s LVOT VTI:          0.227 m  LVOT/AV VTI ratio: 0.87  AORTA Ao Root diam: 2.50 cm Ao Asc diam:  2.90 cm MITRAL VALVE MV Area (PHT): 2.90 cm     SHUNTS MV Decel Time: 262 msec     Systemic VTI:  0.23 m MV E velocity: 101.00 cm/s  Systemic Diam: 1.80 cm MV A velocity: 95.90 cm/s MV E/A ratio:  1.05 Dina Rich MD Electronically signed by Dina Rich MD Signature Date/Time: 05/12/2023/12:31:07 PM    Final    VAS US CAROTID  Result Date: 05/12/2023 Carotid Arterial Duplex Study Patient Name:  SARALYNN TORSIELLO  Date of Exam:   05/12/2023 Medical Rec #: [id number]     Accession #:    [id number] Date of Birth: February 14, 1956    Patient Gender: F Patient Age:   21 years Exam Location:  [facility] Procedure:      VAS US CAROTID Referring Phys: Ramiro Harvest --------------------------------------------------------------------------------  Indications:       CVA, Speech disturbance and right facial droop. Risk Factors:      Hypertension, hyperlipidemia, Diabetes. Comparison Study:  No previous study. Performing Technologist: McKayla Maag RVT, [place]  Examination Guidelines: A complete evaluation includes B-mode imaging, spectral Doppler, color Doppler, and power Doppler as needed of all accessible portions of each vessel. Bilateral testing is considered an integral part of a complete examination. Limited examinations for reoccurring indications may be performed as noted.  Right Carotid Findings: +----------+--------+--------+--------+---------------------+------------------+           PSV cm/sEDV cm/sStenosisPlaque Description   Comments           +----------+--------+--------+--------+---------------------+------------------+ CCA Prox  82      6                                    intimal thickening +----------+--------+--------+--------+---------------------+------------------+ CCA Distal81      10                                   intimal thickening  +----------+--------+--------+--------+---------------------+------------------+ ICA Prox  148     16      40-59%  irregular,                                                                heterogenous and                                                          calcific                                +----------+--------+--------+--------+---------------------+------------------+ ICA Mid   117     21                                                      +----------+--------+--------+--------+---------------------+------------------+  ICA Distal85      15                                                      +----------+--------+--------+--------+---------------------+------------------+ ECA       100     0                                                       +----------+--------+--------+--------+---------------------+------------------+ +----------+--------+-------+----------------+-------------------+           PSV cm/sEDV cmsDescribe        Arm Pressure (mmHG) +----------+--------+-------+----------------+-------------------+ [id number]            Multiphasic, WNL                    +----------+--------+-------+----------------+-------------------+ +---------+--------+--+--------+-+---------+ VertebralPSV cm/s41EDV cm/s9Antegrade +---------+--------+--+--------+-+---------+  Left Carotid Findings: +----------+-------+-------+--------+---------------------------------+--------+           PSV    EDV    StenosisPlaque Description               Comments           cm/s   cm/s                                                     +----------+-------+-------+--------+---------------------------------+--------+ CCA Prox  114    13                                                       +----------+-------+-------+--------+---------------------------------+--------+ CCA Distal69     10                                                        +----------+-------+-------+--------+---------------------------------+--------+ ICA Prox  83     10     1-39%   irregular, heterogenous and                                               calcific                                  +----------+-------+-------+--------+---------------------------------+--------+ ICA Distal64     13                                                       +----------+-------+-------+--------+---------------------------------+--------+ ECA       94                                                              +----------+-------+-------+--------+---------------------------------+--------+ +----------+--------+--------+----------------+-------------------+  PSV cm/sEDV cm/sDescribe        Arm Pressure (mmHG) +----------+--------+--------+----------------+-------------------+ [id number]             Multiphasic, WNL                    +----------+--------+--------+----------------+-------------------+ +---------+--------+--+--------+--+---------+ VertebralPSV cm/s42EDV cm/s10Antegrade +---------+--------+--+--------+--+---------+   Summary: Right Carotid: Velocities in the right ICA are consistent with a 40-59%                stenosis. Left Carotid: Velocities in the left ICA are consistent with a 1-39% stenosis. Vertebrals:  Bilateral vertebral arteries demonstrate antegrade flow. Subclavians: Normal flow hemodynamics were seen in bilateral subclavian              arteries. *See table(s) above for measurements and observations.     Preliminary    MR BRAIN WO CONTRAST  Result Date: 05/11/2023 CLINICAL DATA:  Neuro deficit, acute, stroke suspected EXAM: MRI HEAD WITHOUT CONTRAST TECHNIQUE: Multiplanar, multiecho pulse sequences of the brain and surrounding structures were obtained without intravenous contrast. COMPARISON:  None Available. FINDINGS: Brain: Small acute infarct in the posterior left frontal lobe. No significant mass  effect. Severe patchy T2/FLAIR hyperintensity in the white matter, compatible with chronic microvascular ischemic disease. Numerous small foci of suspect artifact, predominantly within the posterior temporal lobes, parietal, and occipital lobes as well as the cerebellum. Also focus within the right thalamus. No evidence of acute hemorrhage. Vascular: Major arterial flow voids are maintained. Skull and upper cervical spine: Normal marrow signal. Sinuses/Orbits: Negative. IMPRESSION: 1. Small acute infarct in the posterior left frontal lobe. 2. Severe chronic microvascular ischemic disease. 3. Numerous chronic microhemorrhages, predominantly posterior. These could be related to chronic hypertension or amyloid angiopathy. Electronically Signed   By: Feliberto Harts M.D.   On: 05/11/2023 16:57   CT ANGIO HEAD NECK W WO CM  Result Date: 05/10/2023 CLINICAL DATA:  Initial evaluation for stroke. EXAM: CT ANGIOGRAPHY HEAD AND NECK WITH AND WITHOUT CONTRAST TECHNIQUE: Multidetector CT imaging of the head and neck was performed using the standard protocol during bolus administration of intravenous contrast. Multiplanar CT image reconstructions and MIPs were obtained to evaluate the vascular anatomy. Carotid stenosis measurements (when applicable) are obtained utilizing NASCET criteria, using the distal internal carotid diameter as the denominator. RADIATION DOSE REDUCTION: This exam was performed according to the departmental dose-optimization program which includes automated exposure control, adjustment of the mA and/or kV according to patient size and/or use of iterative reconstruction technique. CONTRAST:  75mL OMNIPAQUE IOHEXOL 350 MG/ML SOLN COMPARISON:  Prior study from earlier the same day. FINDINGS: CTA NECK FINDINGS Aortic arch: Visualized aortic arch normal in caliber with standard branch pattern. No stenosis about the origin the great vessels. Right carotid system: Right common and internal carotid arteries  are patent without dissection. Calcified atheromatous plaque about the proximal cervical right ICA with associated stenosis of up to 50% by NASCET criteria. Left carotid system: Left common and internal carotid arteries are patent without dissection. Mild atheromatous change about the left carotid bulb without stenosis. Vertebral arteries: Both vertebral arteries arise from subclavian arteries. No proximal subclavian artery stenosis. Vertebral arteries are patent without stenosis or dissection. Skeleton: No worrisome osseous lesions. Mild for age cervical spondylosis. Other neck: Few thyroid nodules noted, largest of which measures 2.6 cm at the isthmus (series 8, image 24) This has been evaluated on previous imaging. (ref: J Am Coll Radiol. 2015 Feb;12(2): 143-50). Upper chest: No acute finding. Review of the MIP  images confirms the above findings CTA HEAD FINDINGS Anterior circulation: Examination technically limited by poor timing of the contrast bolus. Both internal carotid arteries are patent through the siphons without stenosis or other abnormality. A1 segments, anterior communicating complex common anterior cerebral arteries patent without stenosis. No M1 stenosis or occlusion. No proximal MCA branch occlusion or high-grade stenosis. Distal MCA branches perfused and symmetric. Posterior circulation: Both V4 segments widely patent. Both PICA patent. Basilar widely patent without stenosis. Superior cerebellar and posterior cerebral arteries patent bilaterally. Venous sinuses: Patent allowing for timing the contrast bolus. Anatomic variants: None significant.  No aneurysm. Review of the MIP images confirms the above findings IMPRESSION: 1. Negative CTA for large vessel occlusion or other emergent finding. 2. Calcified atheromatous plaque about the proximal cervical right ICA with associated stenosis of up to 50% by NASCET criteria. Electronically Signed   By: Rise Mu M.D.   On: 05/10/2023 23:37    CT Head Wo Contrast  Result Date: 05/10/2023 CLINICAL DATA:  Facial paralysis/weakness and slurred speech EXAM: CT HEAD WITHOUT CONTRAST TECHNIQUE: Contiguous axial images were obtained from the base of the skull through the vertex without intravenous contrast. RADIATION DOSE REDUCTION: This exam was performed according to the departmental dose-optimization program which includes automated exposure control, adjustment of the mA and/or kV according to patient size and/or use of iterative reconstruction technique. COMPARISON:  MRI head report from 08/11/2022 FINDINGS: Brain: Asymmetric hypoattenuation within the right insula (series 2/image 15). No comparison images are available and this may be chronic however is concerning for acute infarct. No intracranial hemorrhage or mass effect. No hydrocephalus. No extra-axial fluid collection. Generalized cerebral atrophy. Extensive ill-defined hypoattenuation within the cerebral white matter is nonspecific but consistent with chronic small vessel ischemic disease. Vascular: No hyperdense vessel. Intracranial arterial calcification. Skull: No fracture or focal lesion. Sinuses/Orbits: No acute finding. Paranasal sinuses and mastoid air cells are well aerated. Other: None. IMPRESSION: 1. Asymmetric hypoattenuation within the right insula. This is concerning for acute infarct. MRI is recommended for further evaluation. 2. Extensive chronic small vessel ischemic disease. Critical Value/emergent results were called by telephone at the time of interpretation on 05/10/2023 at 8:04 pm to provider MATTHEW TRIFAN , who verbally acknowledged these results. Electronically Signed   By: Minerva Fester M.D.   On: 05/10/2023 20:10     PHYSICAL EXAM  Temp:  [97.7 F (36.5 C)-98.3 F (36.8 C)] 98 F (36.7 C) (06/08 1206) Pulse Rate:  [63-75] 63 (06/08 1206) Resp:  [11-18] 17 (06/08 1206) BP: (111-147)/(44-66) 127/44 (06/08 1206) SpO2:  [99 %-100 %] 99 % (06/08 1206)  General  - Well nourished, well developed, in no apparent distress.  Ophthalmologic - fundi not visualized due to noncooperation.  Cardiovascular - Regular rhythm and rate.  Mental Status -  Level of arousal and orientation to time, place, and person were intact. Language including expression, naming, repetition, comprehension was assessed and found intact. Slight dysarthria Attention span and concentration were normal. Recent and remote memory were intact on registration but 1/3 delayed recall.  Cranial Nerves II - XII - II - Visual field intact OU. III, IV, VI - Extraocular movements intact. V - Facial sensation intact bilaterally. VII - mild right facial droop VIII - Hearing & vestibular intact bilaterally. X - Palate elevates symmetrically. XI - Chin turning & shoulder shrug intact bilaterally. XII - Tongue protrusion intact.  Motor Strength - The patient's strength was normal in all extremities and pronator drift was absent.  Bulk  was normal and fasciculations were absent.   Motor Tone - Muscle tone was assessed at the neck and appendages and was normal.  Reflexes - The patient's reflexes were symmetrical in all extremities and she had no pathological reflexes.  Sensory - Light touch, temperature/pinprick were assessed and were symmetrical.    Coordination - The patient had normal movements in the hands and feet with no ataxia or dysmetria.  Tremor was absent.  Gait and Station - deferred.   ASSESSMENT/PLAN [name] is a 67 y.o. female with history of hypertension, hyperlipidemia, diabetes, MCI admitted for slurred speech and right facial droop. No tPA given due to outside window.    Stroke:  left frontal juxtacortical infarct, likely secondary to small vessel disease source CT concerning for right insular hypoattenuation CTA head and neck right ICA 50% stenosis MRI left frontal juxtacortical small infarct.  Numerous microhemorrhages with peripheral pattern, concerning for  CAA MRA head unremarkable Carotid Doppler right ICA 40 to 59% stenosis 2D Echo EF 60 to 65% LDL 89 HgbA1c 7.4 Lovenox for VTE prophylaxis No antithrombotic prior to admission, now on aspirin 81 mg daily.  No DAPT given CAA. Patient counseled to be compliant with her antithrombotic medications Ongoing aggressive stroke risk factor management Therapy recommendations: Outpatient PT and OT Disposition: Pending  CAA MCI MRI scattered foci of susceptibility predominantly at the gray-white junction, suggestive of cerebral amyloid angiopathy. Patient recently diagnosed with MCI Patient has significant family history of abdominal disease Avoid DAPT Continue home Aricept She has appointment with neurology at [facility] Dr. Alton Revere on 9/12  Diabetes HgbA1c 7.4 goal < 7.0 Uncontrolled Currently on insulin CBG monitoring SSI DM education and close PCP follow up  Hypertension Stable Avoid high BP due to CAA Long term BP goal normotensive  Hyperlipidemia Home meds: Crestor 20 LDL 89, goal < 70 Now on Crestor 20 No increase of Crestor dose given CAA Continue statin at discharge  Other Stroke Risk Factors Advanced age Obesity, Body mass index is 30.36 kg/m.   Other Active Problems   Hospital day # 0  Neurology will sign off. Please call with questions. Pt will follow up Dr. Alton Revere at [facility] as scheduled on 08/16/2023. Thanks for the consult.   Marvel Plan, MD PhD Stroke Neurology 05/12/2023 12:55 PM    To contact Stroke Continuity provider, please refer to [email or web address]. After hours, contact General Neurology

## 2023-05-12 NOTE — Progress Notes (Signed)
Bilateral carotid ultrasound study completed.   Please see CV Procedures for preliminary results.  Zuriah Bordas, RVT  10:02 AM 05/12/23

## 2023-05-12 NOTE — Progress Notes (Signed)
CBG 62 this AM, juice given, CBG recheck 82

## 2023-05-12 NOTE — Progress Notes (Signed)
  Echocardiogram 2D Echocardiogram has been performed.  Jerman Tinnon Wynn Banker 05/12/2023, 10:05 AM

## 2023-05-12 NOTE — Evaluation (Signed)
Physical Therapy Evaluation Patient Details Name: Natasha Chandler MRN: [id number] DOB: 04-07-1956 Today's Date: 05/12/2023  History of Present Illness  Pt is a 67 y.o. female presenting 05/10/2023 with difficulty with speech and R facial droop. CT scan of the head noted asymmetric hypoattenuation within the right insula concerning for acute infarct. 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 PMH significant for DM2, HTN, HLD, Vit D deficiency, potential early Alzheimers dementia.   Clinical Impression  Pt presents with condition above and deficits mentioned below, see PT Problem List. PTA, she was independent without AD, living alone in a 2nd level condo. Per chart, she has some cognitive deficits at baseline and it is unclear if she is currently at her baseline as no family was present during the evaluation. She was able to ambulate without UE support and navigate stairs with x1 UE support without any physical assist this date. She does display some mild balance deficits when her vestibular system is challenged while ambulating, but she was able to recover her balance on her own. See the brief Vestibular Assessment noted below. Her bil lower extremity strength, sensation, and coordination appeared grossly intact, WFL, and symmetrical though. As pt appears close to her functional baseline but primarily demonstrates deficits in mobility safety due to chronic "vertigo", recommending Vestibular OPPT follow-up. Will continue to follow acutely.    Vestibular Assessment - 05/12/23 0001       Vestibular Assessment   General Observation Pt does not appear to have any particular canal involved contributing to her vertigo as she denied any reproduction of symptoms with all tests, but there is questionable L nystagmus noted at times. Symptoms were only reproduced 1x when looking up while ambulating. Provided exercises instead on habituating her vestibular system as this reportedly tends to  assist in reducing her symptoms in the past.      Symptom Behavior   Subjective history of current problem Pt reports a hx of vertigo since she was young and that is "seasonal". She reports her treatment has consisted of exercises she got off "[facility]" and they seem to help. She also reports her family members have similar issues.    Type of Dizziness  Vertigo    Frequency of Dizziness seasonal    Symptom Nature Motion provoked    Aggravating Factors Turning body quickly;Turning head quickly    Relieving Factors Head stationary    History of similar episodes yes, many years of "seasonal vertigo"      Oculomotor Exam   Oculomotor Alignment Normal    Ocular ROM WFL    Spontaneous Absent    Gaze-induced  Left beating nystagmus with L gaze   questionable   Smooth Pursuits Intact      Oculomotor Exam-Fixation Suppressed    Left Head Impulse x1 correction    Right Head Impulse x1 correction      Positional Testing   Dix-Hallpike Dix-Hallpike Right;Dix-Hallpike Left    Horizontal Canal Testing Horizontal Canal Right;Horizontal Canal Left      Dix-Hallpike Right   Dix-Hallpike Right Duration negative    Dix-Hallpike Right Symptoms No nystagmus      Dix-Hallpike Left   Dix-Hallpike Left Duration no symptoms    Dix-Hallpike Left Symptoms Left nystagmus   questionable     Horizontal Canal Right   Horizontal Canal Right Duration negative    Horizontal Canal Right Symptoms Normal      Horizontal Canal Left   Horizontal Canal Left Duration negative  Horizontal Canal Left Symptoms Normal                 Recommendations for follow up therapy are one component of a multi-disciplinary discharge planning process, led by the attending physician.  Recommendations may be updated based on patient status, additional functional criteria and insurance authorization.  Follow Up Recommendations       Assistance Recommended at Discharge Intermittent Supervision/Assistance  Patient can  return home with the following  Assistance with cooking/housework;Assist for transportation;Direct supervision/assist for medications management;Direct supervision/assist for financial management    Equipment Recommendations None recommended by PT  Recommendations for Other Services       Functional Status Assessment Patient has had a recent decline in their functional status and demonstrates the ability to make significant improvements in function in a reasonable and predictable amount of time.     Precautions / Restrictions Precautions Precautions: Fall (low) Precaution Comments: hx of vertigo Restrictions Weight Bearing Restrictions: No      Mobility  Bed Mobility               General bed mobility comments: Pt sitting in recliner upon arrival    Transfers Overall transfer level: Needs assistance Equipment used: Rolling walker (2 wheels) Transfers: Sit to/from Stand Sit to Stand: Supervision           General transfer comment: Supervision for safety coming to stand from recliner to RW but pt did not require RW, quickly getting rid of it    Ambulation/Gait Ambulation/Gait assistance: Min guard Gait Distance (Feet): 260 Feet Assistive device: None Gait Pattern/deviations: Step-through pattern, Decreased stride length, Drifts right/left Gait velocity: reduced Gait velocity interpretation: 1.31 - 2.62 ft/sec, indicative of limited community ambulator   General Gait Details: Pt ambulates steadily without LOB when not provided vestibular challenges. When cued to look L <> R she did drift mildly laterally but recovered on her own. When cued to look up <> down she had x1 LOB when looking up but recovered on her own. Min guard for safety  Stairs Stairs: Yes Stairs assistance: Min guard Stair Management: One rail Right, One rail Left, Alternating pattern, Step to pattern, Forwards Number of Stairs: 6 General stair comments: Ascends with R rail and reciprocal  pattern and descends with L rail and step-to pattern, no LOB, min guard for safety  Wheelchair Mobility    Modified Rankin (Stroke Patients Only) Modified Rankin (Stroke Patients Only) Pre-Morbid Rankin Score: No significant disability Modified Rankin: Slight disability     Balance Overall balance assessment: Mild deficits observed, not formally tested                                           Pertinent Vitals/Pain Pain Assessment Pain Assessment: No/denies pain    Home Living Family/patient expects to be discharged to:: Private residence Living Arrangements: Alone Available Help at Discharge: Family;Available 24 hours/day Type of Home: Apartment (condo) Home Access: Stairs to enter Entrance Stairs-Rails: Right (ascending) Entrance Stairs-Number of Steps: 15   Home Layout: One level Home Equipment: Rollator (4 wheels);Rolling Walker (2 wheels);Grab bars - tub/shower (not heavy duty grab bars though)      Prior Function Prior Level of Function : Needs assist;Driving             Mobility Comments: Mod I with no AD, x1-2 falls in past 6 months ADLs Comments: Sister assists  with set-up of pill box, pt endorses x1 mistake in the past when sister did not supervise; otherwise pt independent with ADLs and does her own cooking; sister occasionally cleans; drives but reports her family does not like her driving     Hand Dominance   Dominant Hand: Right    Extremity/Trunk Assessment   Upper Extremity Assessment Upper Extremity Assessment: Defer to OT evaluation    Lower Extremity Assessment Lower Extremity Assessment: Overall WFL for tasks assessed (strength, sensation, and coordination appeared intact, WFL, and symmetrical bil)    Cervical / Trunk Assessment Cervical / Trunk Assessment: Normal  Communication   Communication: Expressive difficulties (mild if still present per pt report)  Cognition Arousal/Alertness: Awake/alert Behavior During  Therapy: WFL for tasks assessed/performed Overall Cognitive Status: History of cognitive impairments - at baseline                                 General Comments: Per chart, questionable cognitive deficits at baseline but pt reports she passed a recent test for dementia but has x2 remaining. Recalls information from past, but tangential speech, needing redirecting often.        General Comments General comments (skin integrity, edema, etc.): Provided [employer] HEP handout with Access Code: [email or web address] on vestibular habituation exercises    Exercises     Assessment/Plan    PT Assessment Patient needs continued PT services  PT Problem List Decreased balance;Decreased mobility;Decreased cognition       PT Treatment Interventions DME instruction;Gait training;Functional mobility training;Stair training;Therapeutic activities;Therapeutic exercise;Balance training;Neuromuscular re-education;Patient/family education;Cognitive remediation    PT Goals (Current goals can be found in the Care Plan section)  Acute Rehab PT Goals Patient Stated Goal: to be independent PT Goal Formulation: With patient Time For Goal Achievement: 05/26/23 Potential to Achieve Goals: Good    Frequency Min 3X/week     Co-evaluation               AM-PAC PT "6 Clicks" Mobility  Outcome Measure Help needed turning from your back to your side while in a flat bed without using bedrails?: None Help needed moving from lying on your back to sitting on the side of a flat bed without using bedrails?: None Help needed moving to and from a bed to a chair (including a wheelchair)?: A Little Help needed standing up from a chair using your arms (e.g., wheelchair or bedside chair)?: A Little Help needed to walk in hospital room?: A Little Help needed climbing 3-5 steps with a railing? : A Little 6 Click Score: 20    End of Session Equipment Utilized During Treatment: Gait belt Activity Tolerance:  Patient tolerated treatment well Patient left: in chair;with call bell/phone within reach Nurse Communication: Mobility status (no chair alarm on upon arrival) PT Visit Diagnosis: Unsteadiness on feet (R26.81);Dizziness and giddiness (R42);Other symptoms and signs involving the nervous system (R29.898)    Time: 1610-9604 PT Time Calculation (min) (ACUTE ONLY): 47 min   Charges:   PT Evaluation $PT Eval Moderate Complexity: 1 Mod PT Treatments $Therapeutic Activity: 23-37 mins        Raymond Gurney, PT, DPT Acute Rehabilitation Services  Office: [phone]   Jewel Baize 05/12/2023, 1:31 PM

## 2023-05-12 NOTE — Plan of Care (Signed)
  Problem: Education: Goal: Knowledge of disease or condition will improve Outcome: Progressing   Problem: Ischemic Stroke/TIA Tissue Perfusion: Goal: Complications of ischemic stroke/TIA will be minimized Outcome: Progressing   Problem: Coping: Goal: Will identify appropriate support needs Outcome: Progressing   Problem: Health Behavior/Discharge Planning: Goal: Ability to manage health-related needs will improve Outcome: Progressing Goal: Goals will be collaboratively established with patient/family Outcome: Progressing   Problem: Self-Care: Goal: Ability to participate in self-care as condition permits will improve Outcome: Progressing Goal: Ability to communicate needs accurately will improve Outcome: Progressing

## 2023-05-12 NOTE — Progress Notes (Signed)
PROGRESS NOTE    Natasha Chandler  ZOX:[id number] DOB: 23-May-1956 DOA: 05/10/2023 PCP: Judd Lien, PA-C    Chief Complaint  Patient presents with   Cerebrovascular Accident    ?    Brief Narrative:  Patient 67 year old female history of hypertension, hyperlipidemia, diabetes mellitus type 2, amyloid, early dementia presented with right-sided facial droop and some speech disturbance.  Patient noted to be speaking with PCP on the phone who noted patient's speech was off and recommended patient present to the ED.  Patient also noted to be drooling out the right side of her mouth with a right facial droop.  Patient seen in the ED.  Head CT done with asymmetric hypoattenuation within the right insula concerning for acute infarct.  CT angiogram head and neck done with no LVO.  MRI brain done consistent with small acute infarct of the posterior left frontal lobe, chronic microhemorrhage thought to be secondary to chronic hypertension or amyloid angiopathy.  Patient admitted for stroke workup.  Neurology consulted.   Assessment & Plan:   Principal Problem:   Facial droop Active Problems:   CVA (cerebral vascular accident) (HCC)   Uncontrolled type 2 diabetes mellitus with hyperglycemia, with long-term current use of insulin (HCC)   Hypertension   Pure hypercholesterolemia   Acquired autoimmune hypothyroidism   Asthma   Dementia without behavioral disturbance (HCC)   #1 small acute infarct posterior left frontal lobe -Patient had presented with some dysarthric speech, right facial weakness and drooling. -CT head done with asymmetric hypoattenuation within the right insula concerning for acute infarct. -CT angiogram head and neck done with no LVO. -MRI brain done with small acute infarct of the posterior left frontal lobe, chronic microhemorrhage thought to be secondary to chronic hypertension or amyloid angiopathy. -2D echo done with normal EF,NWMA, intra-atrial septum not  well-visualized. -MRA head done with no large vessel occlusion, high-grade stenosis, or aneurysm of the intracranial circulation.  Redemonstrated scattered foci of susceptibility predominantly at the gray-white junction suggestive of cerebral amyloid angiopathy. -Carotid ultrasound done with right ICA 40 to 59% stenosis, left ICA 1 to 39% stenosis. -Patient started on aspirin for secondary stroke prophylaxis. -Fasting lipid panel with a total cholesterol 135, LDL of 89. -Continue Crestor 20 mg daily. -Hemoglobin A1c 7.4. -PT/OT/SLP. -Neurology following and appreciate input and recommendations.  2.  Diabetes mellitus type 2 with long-term insulin use -Hemoglobin A1c 7.4. -Patient noted to be on a home regimen of yellow-green 60 units daily, NovoLog 20 units 3 times daily with meals and Ozempic. -Patient noted to have received Semglee 60 units x 1 dose on admission. -CBG noted at 62 this morning patient given orange juice and CBG up to 82. -Decrease Semglee to 10 units daily.  SSI.  3.  Hypothyroidism -Synthroid.  4.  Hypertension -Permissive hypertension. -Continue to hold antihypertensive medications. -IV fluids.  5.  Hyperlipidemia -Fasting lipid panel with a total cholesterol 135, LDL of 89. -Continue Crestor.  6.  Asthma -MDI.  7.  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 -Continue Aricept. -Delirium precautions.   DVT prophylaxis: Lovenox Code Status: Full Family Communication: Updated patient.  No family at bedside. Disposition: Likely home once stroke workup is completed, clinical improvement and cleared by neurology.  Status is: Observation The patient remains OBS appropriate  and will d/c before 2 midnights.   Consultants:  Neurology:  Dr.Khaliqdina 05/11/2023  Procedures:  2D echo 05/07/2023 CT head 05/10/2023 CT angiogram head and neck 05/10/2023 Carotid Dopplers 05/12/2023 MRI brain 05/11/2023 MRA head 05/12/2023  Antimicrobials:  Anti-infectives (From admission, onward)    None         Subjective: Sitting up in chair.  Just finished working with therapy.  Denies any chest pain or shortness of breath.  Still with some dysarthric speech unchanged from admission per RN and per patient.  Patient states some improvement with right facial droop and drooling.  Patient endorses seasonal vertigo since childhood.  Objective: Vitals:   05/12/23 0041 05/12/23 0412 05/12/23 0813 05/12/23 1206  BP: 123/62 (!) 126/47 (!) 111/54 (!) 127/44  Pulse: 74 65 72 63  Resp: 16 16 17 17   Temp: 98.2 F (36.8 C) 97.8 F (36.6 C) 97.9 F (36.6 C) 98 F (36.7 C)  TempSrc: Axillary Oral Oral Oral  SpO2: 99% 100% 100% 99%  Weight:      Height:       No intake or output data in the 24 hours ending 05/12/23 1222 Filed Weights   05/10/23 1857  Weight: 75.3 kg    Examination:  General exam: Appears calm and comfortable  Respiratory system: Clear to auscultation.  No wheezes, no crackles, no rhonchi.  Fair air movement.  Speaking in full sentences.  Cardiovascular system: S1 & S2 heard, RRR. No JVD, murmurs, rubs, gallops or clicks. No pedal edema. Gastrointestinal system: Abdomen is nondistended, soft and nontender. No organomegaly or masses felt. Normal bowel sounds heard. Central nervous system: Alert and oriented.  Right facial weakness otherwise rest of cranial nerves II through XII grossly intact.  Dysarthric speech.  Extremities: Symmetric 5 x 5 power. Skin: No rashes, lesions or ulcers Psychiatry: Judgement and insight appear normal. Mood & affect appropriate.     Data Reviewed: I have personally reviewed following labs and imaging studies  CBC: Recent Labs  Lab 05/10/23 2000 05/12/23 0320  WBC 9.4 8.5  NEUTROABS 5.9  --    HGB 13.7 13.6  HCT 41.5 40.7  MCV 87.9 87.5  PLT 335 301    Basic Metabolic Panel: Recent Labs  Lab 05/10/23 2000 05/12/23 0320  NA 136 138  K 3.8 3.5  CL 103 101  CO2 25 26  GLUCOSE 146* 78  BUN 30* 16  CREATININE 0.92 0.65  CALCIUM 9.1 9.0    GFR: Estimated Creatinine Clearance: 65.7 mL/min (by C-G formula based on SCr of 0.65 mg/dL).  Liver Function Tests: No results for input(s): "AST", "ALT", "ALKPHOS", "BILITOT", "PROT", "ALBUMIN" in the last 168 hours.  CBG: Recent Labs  Lab 05/11/23 1642 05/11/23 2115 05/12/23 0625 05/12/23 0649 05/12/23 1206  GLUCAP 80 138* 62* 82 112*     No results found for this or any previous visit (from the past 240 hour(s)).       Radiology Studies: VAS US CAROTID  Result Date: 05/12/2023 Carotid Arterial Duplex Study Patient Name:  THENA FLEISHMAN  Date of Exam:   05/12/2023 Medical Rec #: [id number]     Accession #:    [id number] Date of Birth: Feb 15, 1956    Patient Gender: F Patient Age:   89 years Exam Location:  [facility] [place] Procedure:      VAS US CAROTID Referring Phys: Ramiro Harvest --------------------------------------------------------------------------------  Indications:       CVA, Speech disturbance and right facial droop. Risk Factors:  Hypertension, hyperlipidemia, Diabetes. Comparison Study:  No previous study. Performing Technologist: McKayla Maag RVT, [place]  Examination Guidelines: A complete evaluation includes B-mode imaging, spectral Doppler, color Doppler, and power Doppler as needed of all accessible portions of each vessel. Bilateral testing is considered an integral part of a complete examination. Limited examinations for reoccurring indications may be performed as noted.  Right Carotid Findings: +----------+--------+--------+--------+---------------------+------------------+           PSV cm/sEDV cm/sStenosisPlaque Description   Comments            +----------+--------+--------+--------+---------------------+------------------+ CCA Prox  82      6                                    intimal thickening +----------+--------+--------+--------+---------------------+------------------+ CCA Distal81      10                                   intimal thickening +----------+--------+--------+--------+---------------------+------------------+ ICA Prox  148     16      40-59%  irregular,                                                                heterogenous and                                                          calcific                                +----------+--------+--------+--------+---------------------+------------------+ ICA Mid   117     21                                                      +----------+--------+--------+--------+---------------------+------------------+ ICA Distal85      15                                                      +----------+--------+--------+--------+---------------------+------------------+ ECA       100     0                                                       +----------+--------+--------+--------+---------------------+------------------+ +----------+--------+-------+----------------+-------------------+           PSV cm/sEDV cmsDescribe        Arm Pressure (mmHG) +----------+--------+-------+----------------+-------------------+ [id number]            Multiphasic, WNL                    +----------+--------+-------+----------------+-------------------+ +---------+--------+--+--------+-+---------+  VertebralPSV cm/s41EDV cm/s9Antegrade +---------+--------+--+--------+-+---------+  Left Carotid Findings: +----------+-------+-------+--------+---------------------------------+--------+           PSV    EDV    StenosisPlaque Description               Comments           cm/s   cm/s                                                      +----------+-------+-------+--------+---------------------------------+--------+ CCA Prox  114    13                                                       +----------+-------+-------+--------+---------------------------------+--------+ CCA Distal69     10                                                       +----------+-------+-------+--------+---------------------------------+--------+ ICA Prox  83     10     1-39%   irregular, heterogenous and                                               calcific                                  +----------+-------+-------+--------+---------------------------------+--------+ ICA Distal64     13                                                       +----------+-------+-------+--------+---------------------------------+--------+ ECA       94                                                              +----------+-------+-------+--------+---------------------------------+--------+ +----------+--------+--------+----------------+-------------------+           PSV cm/sEDV cm/sDescribe        Arm Pressure (mmHG) +----------+--------+--------+----------------+-------------------+ [id number]             Multiphasic, WNL                    +----------+--------+--------+----------------+-------------------+ +---------+--------+--+--------+--+---------+ VertebralPSV cm/s42EDV cm/s10Antegrade +---------+--------+--+--------+--+---------+   Summary: Right Carotid: Velocities in the right ICA are consistent with a 40-59%                stenosis. Left Carotid: Velocities in the left ICA are consistent with a 1-39% stenosis. Vertebrals:  Bilateral vertebral arteries demonstrate antegrade flow. Subclavians: Normal flow hemodynamics were seen in bilateral subclavian  arteries. *See table(s) above for measurements and observations.     Preliminary    MR BRAIN WO CONTRAST  Result Date: 05/11/2023 CLINICAL DATA:  Neuro  deficit, acute, stroke suspected EXAM: MRI HEAD WITHOUT CONTRAST TECHNIQUE: Multiplanar, multiecho pulse sequences of the brain and surrounding structures were obtained without intravenous contrast. COMPARISON:  None Available. FINDINGS: Brain: Small acute infarct in the posterior left frontal lobe. No significant mass effect. Severe patchy T2/FLAIR hyperintensity in the white matter, compatible with chronic microvascular ischemic disease. Numerous small foci of suspect artifact, predominantly within the posterior temporal lobes, parietal, and occipital lobes as well as the cerebellum. Also focus within the right thalamus. No evidence of acute hemorrhage. Vascular: Major arterial flow voids are maintained. Skull and upper cervical spine: Normal marrow signal. Sinuses/Orbits: Negative. IMPRESSION: 1. Small acute infarct in the posterior left frontal lobe. 2. Severe chronic microvascular ischemic disease. 3. Numerous chronic microhemorrhages, predominantly posterior. These could be related to chronic hypertension or amyloid angiopathy. Electronically Signed   By: Feliberto Harts M.D.   On: 05/11/2023 16:57   CT ANGIO HEAD NECK W WO CM  Result Date: 05/10/2023 CLINICAL DATA:  Initial evaluation for stroke. EXAM: CT ANGIOGRAPHY HEAD AND NECK WITH AND WITHOUT CONTRAST TECHNIQUE: Multidetector CT imaging of the head and neck was performed using the standard protocol during bolus administration of intravenous contrast. Multiplanar CT image reconstructions and MIPs were obtained to evaluate the vascular anatomy. Carotid stenosis measurements (when applicable) are obtained utilizing NASCET criteria, using the distal internal carotid diameter as the denominator. RADIATION DOSE REDUCTION: This exam was performed according to the departmental dose-optimization program which includes automated exposure control, adjustment of the mA and/or kV according to patient size and/or use of iterative reconstruction technique.  CONTRAST:  75mL OMNIPAQUE IOHEXOL 350 MG/ML SOLN COMPARISON:  Prior study from earlier the same day. FINDINGS: CTA NECK FINDINGS Aortic arch: Visualized aortic arch normal in caliber with standard branch pattern. No stenosis about the origin the great vessels. Right carotid system: Right common and internal carotid arteries are patent without dissection. Calcified atheromatous plaque about the proximal cervical right ICA with associated stenosis of up to 50% by NASCET criteria. Left carotid system: Left common and internal carotid arteries are patent without dissection. Mild atheromatous change about the left carotid bulb without stenosis. Vertebral arteries: Both vertebral arteries arise from subclavian arteries. No proximal subclavian artery stenosis. Vertebral arteries are patent without stenosis or dissection. Skeleton: No worrisome osseous lesions. Mild for age cervical spondylosis. Other neck: Few thyroid nodules noted, largest of which measures 2.6 cm at the isthmus (series 8, image 24) This has been evaluated on previous imaging. (ref: J Am Coll Radiol. 2015 Feb;12(2): 143-50). Upper chest: No acute finding. Review of the MIP images confirms the above findings CTA HEAD FINDINGS Anterior circulation: Examination technically limited by poor timing of the contrast bolus. Both internal carotid arteries are patent through the siphons without stenosis or other abnormality. A1 segments, anterior communicating complex common anterior cerebral arteries patent without stenosis. No M1 stenosis or occlusion. No proximal MCA branch occlusion or high-grade stenosis. Distal MCA branches perfused and symmetric. Posterior circulation: Both V4 segments widely patent. Both PICA patent. Basilar widely patent without stenosis. Superior cerebellar and posterior cerebral arteries patent bilaterally. Venous sinuses: Patent allowing for timing the contrast bolus. Anatomic variants: None significant.  No aneurysm. Review of the MIP  images confirms the above findings IMPRESSION: 1. Negative CTA for large vessel occlusion or other emergent finding. 2. Calcified  atheromatous plaque about the proximal cervical right ICA with associated stenosis of up to 50% by NASCET criteria. Electronically Signed   By: Rise Mu M.D.   On: 05/10/2023 23:37   CT Head Wo Contrast  Result Date: 05/10/2023 CLINICAL DATA:  Facial paralysis/weakness and slurred speech EXAM: CT HEAD WITHOUT CONTRAST TECHNIQUE: Contiguous axial images were obtained from the base of the skull through the vertex without intravenous contrast. RADIATION DOSE REDUCTION: This exam was performed according to the departmental dose-optimization program which includes automated exposure control, adjustment of the mA and/or kV according to patient size and/or use of iterative reconstruction technique. COMPARISON:  MRI head report from 08/11/2022 FINDINGS: Brain: Asymmetric hypoattenuation within the right insula (series 2/image 15). No comparison images are available and this may be chronic however is concerning for acute infarct. No intracranial hemorrhage or mass effect. No hydrocephalus. No extra-axial fluid collection. Generalized cerebral atrophy. Extensive ill-defined hypoattenuation within the cerebral white matter is nonspecific but consistent with chronic small vessel ischemic disease. Vascular: No hyperdense vessel. Intracranial arterial calcification. Skull: No fracture or focal lesion. Sinuses/Orbits: No acute finding. Paranasal sinuses and mastoid air cells are well aerated. Other: None. IMPRESSION: 1. Asymmetric hypoattenuation within the right insula. This is concerning for acute infarct. MRI is recommended for further evaluation. 2. Extensive chronic small vessel ischemic disease. Critical Value/emergent results were called by telephone at the time of interpretation on 05/10/2023 at 8:04 pm to provider MATTHEW TRIFAN , who verbally acknowledged these results.  Electronically Signed   By: Minerva Fester M.D.   On: 05/10/2023 20:10        Scheduled Meds:  aspirin  81 mg Oral Daily   donepezil  5 mg Oral QHS   enoxaparin (LOVENOX) injection  40 mg Subcutaneous Q24H   insulin aspart  0-15 Units Subcutaneous TID WC   insulin aspart  0-5 Units Subcutaneous QHS   insulin glargine-yfgn  10 Units Subcutaneous Daily   levothyroxine  50 mcg Oral QAC breakfast   mometasone-formoterol  2 puff Inhalation BID   montelukast  10 mg Oral Daily   rosuvastatin  20 mg Oral Daily   sertraline  50 mg Oral Daily   Continuous Infusions:  sodium chloride 75 mL/hr at 05/12/23 1031     LOS: 0 days    Time spent: 40 minutes    Ramiro Harvest, MD [facility]   To contact the attending provider between 7A-7P or the covering provider during after hours 7P-7A, please log into the web site [email or web address] and access using universal [place] password for that web site. If you do not have the password, please call the hospital operator.  05/12/2023, 12:22 PM

## 2023-05-12 NOTE — Progress Notes (Signed)
Pt's sister would like phone call re: test results & d/c - [phone]  - she is working on Monday & will need plan to pick up pt

## 2023-05-12 NOTE — Plan of Care (Signed)
  Problem: Education: Goal: Knowledge of General Education information will improve Description: Including pain rating scale, medication(s)/side effects and non-pharmacologic comfort measures Outcome: Progressing   Problem: Health Behavior/Discharge Planning: Goal: Ability to manage health-related needs will improve Outcome: Progressing   Problem: Clinical Measurements: Goal: Ability to maintain clinical measurements within normal limits will improve Outcome: Progressing Goal: Will remain free from infection Outcome: Progressing Goal: Diagnostic test results will improve Outcome: Progressing Goal: Respiratory complications will improve Outcome: Progressing Goal: Cardiovascular complication will be avoided Outcome: Progressing   Problem: Activity: Goal: Risk for activity intolerance will decrease Outcome: Progressing   Problem: Nutrition: Goal: Adequate nutrition will be maintained Outcome: Progressing   Problem: Coping: Goal: Level of anxiety will decrease Outcome: Progressing   Problem: Elimination: Goal: Will not experience complications related to bowel motility Outcome: Progressing Goal: Will not experience complications related to urinary retention Outcome: Progressing   Problem: Pain Managment: Goal: General experience of comfort will improve Outcome: Progressing   Problem: Safety: Goal: Ability to remain free from injury will improve Outcome: Progressing   Problem: Skin Integrity: Goal: Risk for impaired skin integrity will decrease Outcome: Progressing   Problem: Education: Goal: Ability to describe self-care measures that may prevent or decrease complications (Diabetes Survival Skills Education) will improve Outcome: Progressing Goal: Individualized Educational Video(s) Outcome: Progressing   Problem: Coping: Goal: Ability to adjust to condition or change in health will improve Outcome: Progressing   Problem: Fluid Volume: Goal: Ability to  maintain a balanced intake and output will improve Outcome: Progressing   Problem: Health Behavior/Discharge Planning: Goal: Ability to identify and utilize available resources and services will improve Outcome: Progressing Goal: Ability to manage health-related needs will improve Outcome: Progressing   Problem: Metabolic: Goal: Ability to maintain appropriate glucose levels will improve Outcome: Progressing   Problem: Nutritional: Goal: Maintenance of adequate nutrition will improve Outcome: Progressing Goal: Progress toward achieving an optimal weight will improve Outcome: Progressing   Problem: Skin Integrity: Goal: Risk for impaired skin integrity will decrease Outcome: Progressing   Problem: Tissue Perfusion: Goal: Adequacy of tissue perfusion will improve Outcome: Progressing   Problem: Education: Goal: Knowledge of disease or condition will improve Outcome: Progressing Goal: Knowledge of secondary prevention will improve (MUST DOCUMENT ALL) Outcome: Progressing Goal: Knowledge of patient specific risk factors will improve (Mark N/A or DELETE if not current risk factor) Outcome: Progressing   Problem: Ischemic Stroke/TIA Tissue Perfusion: Goal: Complications of ischemic stroke/TIA will be minimized Outcome: Progressing   Problem: Coping: Goal: Will verbalize positive feelings about self Outcome: Progressing Goal: Will identify appropriate support needs Outcome: Progressing   Problem: Health Behavior/Discharge Planning: Goal: Ability to manage health-related needs will improve Outcome: Progressing Goal: Goals will be collaboratively established with patient/family Outcome: Progressing   Problem: Self-Care: Goal: Ability to participate in self-care as condition permits will improve Outcome: Progressing Goal: Verbalization of feelings and concerns over difficulty with self-care will improve Outcome: Progressing Goal: Ability to communicate needs accurately  will improve Outcome: Progressing   Problem: Nutrition: Goal: Risk of aspiration will decrease Outcome: Progressing Goal: Dietary intake will improve Outcome: Progressing   
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
## 2023-05-13 DIAGNOSIS — I639 Cerebral infarction, unspecified: Secondary | ICD-10-CM | POA: Diagnosis not present

## 2023-05-13 DIAGNOSIS — R29810 Facial weakness: Secondary | ICD-10-CM | POA: Diagnosis not present

## 2023-05-13 DIAGNOSIS — I159 Secondary hypertension, unspecified: Secondary | ICD-10-CM | POA: Diagnosis not present

## 2023-05-13 DIAGNOSIS — E1165 Type 2 diabetes mellitus with hyperglycemia: Secondary | ICD-10-CM | POA: Diagnosis not present

## 2023-05-13 LAB — BASIC METABOLIC PANEL
Anion gap: 6 (ref 5–15)
BUN: 15 mg/dL (ref 8–23)
CO2: 24 mmol/L (ref 22–32)
Calcium: 8.7 mg/dL — ABNORMAL LOW (ref 8.9–10.3)
Chloride: 107 mmol/L (ref 98–111)
Creatinine, Ser: 0.63 mg/dL (ref 0.44–1.00)
GFR, Estimated: >60 mL/min (ref >60)
Glucose, Bld: 86 mg/dL (ref 70–99)
Potassium: 4.3 mmol/L (ref 3.5–5.1)
Sodium: 137 mmol/L (ref 135–145)

## 2023-05-13 LAB — GLUCOSE, CAPILLARY
Glucose-Capillary: 79 mg/dL (ref 70–99)
Glucose-Capillary: 116 mg/dL — ABNORMAL HIGH (ref 70–99)

## 2023-05-13 MED ORDER — INSULIN ASPART FLEXPEN 100 UNIT/ML ~~LOC~~ SOPN: 3.0000 [IU] | PEN_INJECTOR | Freq: Three times a day (TID) | SUBCUTANEOUS | Status: DC

## 2023-05-13 MED ORDER — LOSARTAN POTASSIUM 100 MG PO TABS: 100.0000 mg | ORAL_TABLET | Freq: Every day | ORAL | Status: DC

## 2023-05-13 MED ORDER — INSULIN DEGLUDEC 100 UNIT/ML ~~LOC~~ SOPN: 10.0000 [IU] | PEN_INJECTOR | Freq: Every day | SUBCUTANEOUS | Status: DC

## 2023-05-13 MED ORDER — ASPIRIN 81 MG PO CHEW: 81.0000 mg | CHEWABLE_TABLET | Freq: Every day | ORAL | Status: DC

## 2023-05-13 NOTE — TOC Transition Note (Signed)
Transition of Care [facility]) - CM/SW Discharge Note   Patient Details  Name: Natasha Chandler MRN: [id number] Date of Birth: 1956-11-29  [facility] CM/SW Contact:  Ronny Bacon, RN Phone Number: 05/13/2023, 3:08 PM   Clinical Narrative:  Spoke with patient by phone. Patient lives alone but her sister plans to stay with her during her recovery. Patient given choices for outpatient rehab services and she decided on [facility] since it is close to her house. [facility] Information add to AVS. Patient has RW and shower rail at home. Confirmed with patient that her PCP is Dr. Hyman Hopes and she uses [facility] on [address]. Sister will transport patient home when discharged.    Final next level of care: Home/Self Care Barriers to Discharge: No Barriers Identified   Patient Goals and CMS Choice      Discharge Placement                         Discharge Plan and Services Additional resources added to the After Visit Summary for                                       Social Determinants of Health (SDOH) Interventions SDOH Screenings   Food Insecurity: Food Insecurity Present (05/11/2023)  Housing: Low Risk  (05/11/2023)  Transportation Needs: No Transportation Needs (05/11/2023)  Utilities: Not At Risk (05/11/2023)  Alcohol Screen: Low Risk  (08/13/2020)  Depression (PHQ2-9): Medium Risk (08/13/2020)  Financial Resource Strain: Medium Risk (08/13/2020)  Physical Activity: Insufficiently Active (08/13/2020)  Social Connections: Unknown (08/13/2020)  Stress: Stress Concern Present (08/13/2020)  Tobacco Use: Low Risk  (05/10/2023)     Readmission Risk Interventions     No data to display

## 2023-05-13 NOTE — Plan of Care (Signed)
Problem: Education: Goal: Knowledge of General Education information will improve Description: Including pain rating scale, medication(s)/side effects and non-pharmacologic comfort measures Outcome: Adequate for Discharge   Problem: Health Behavior/Discharge Planning: Goal: Ability to manage health-related needs will improve Outcome: Adequate for Discharge   Problem: Clinical Measurements: Goal: Ability to maintain clinical measurements within normal limits will improve Outcome: Adequate for Discharge Goal: Will remain free from infection Outcome: Adequate for Discharge Goal: Diagnostic test results will improve Outcome: Adequate for Discharge Goal: Respiratory complications will improve Outcome: Adequate for Discharge Goal: Cardiovascular complication will be avoided Outcome: Adequate for Discharge   Problem: Activity: Goal: Risk for activity intolerance will decrease Outcome: Adequate for Discharge   Problem: Nutrition: Goal: Adequate nutrition will be maintained Outcome: Adequate for Discharge   Problem: Coping: Goal: Level of anxiety will decrease Outcome: Adequate for Discharge   Problem: Elimination: Goal: Will not experience complications related to bowel motility Outcome: Adequate for Discharge Goal: Will not experience complications related to urinary retention Outcome: Adequate for Discharge   Problem: Pain Managment: Goal: General experience of comfort will improve Outcome: Adequate for Discharge   Problem: Safety: Goal: Ability to remain free from injury will improve Outcome: Adequate for Discharge   Problem: Skin Integrity: Goal: Risk for impaired skin integrity will decrease Outcome: Adequate for Discharge   Problem: Education: Goal: Ability to describe self-care measures that may prevent or decrease complications (Diabetes Survival Skills Education) will improve Outcome: Adequate for Discharge Goal: Individualized Educational Video(s) Outcome:  Adequate for Discharge   Problem: Coping: Goal: Ability to adjust to condition or change in health will improve Outcome: Adequate for Discharge   Problem: Fluid Volume: Goal: Ability to maintain a balanced intake and output will improve Outcome: Adequate for Discharge   Problem: Health Behavior/Discharge Planning: Goal: Ability to identify and utilize available resources and services will improve Outcome: Adequate for Discharge Goal: Ability to manage health-related needs will improve Outcome: Adequate for Discharge   Problem: Metabolic: Goal: Ability to maintain appropriate glucose levels will improve Outcome: Adequate for Discharge   Problem: Nutritional: Goal: Maintenance of adequate nutrition will improve Outcome: Adequate for Discharge Goal: Progress toward achieving an optimal weight will improve Outcome: Adequate for Discharge   Problem: Skin Integrity: Goal: Risk for impaired skin integrity will decrease Outcome: Adequate for Discharge   Problem: Tissue Perfusion: Goal: Adequacy of tissue perfusion will improve Outcome: Adequate for Discharge   Problem: Education: Goal: Knowledge of disease or condition will improve Outcome: Adequate for Discharge Goal: Knowledge of secondary prevention will improve (MUST DOCUMENT ALL) Outcome: Adequate for Discharge Goal: Knowledge of patient specific risk factors will improve (Mark N/A or DELETE if not current risk factor) Outcome: Adequate for Discharge   Problem: Ischemic Stroke/TIA Tissue Perfusion: Goal: Complications of ischemic stroke/TIA will be minimized Outcome: Adequate for Discharge   Problem: Coping: Goal: Will verbalize positive feelings about self Outcome: Adequate for Discharge Goal: Will identify appropriate support needs Outcome: Adequate for Discharge   Problem: Health Behavior/Discharge Planning: Goal: Ability to manage health-related needs will improve Outcome: Adequate for Discharge Goal: Goals  will be collaboratively established with patient/family Outcome: Adequate for Discharge   Problem: Self-Care: Goal: Ability to participate in self-care as condition permits will improve Outcome: Adequate for Discharge Goal: Verbalization of feelings and concerns over difficulty with self-care will improve Outcome: Adequate for Discharge Goal: Ability to communicate needs accurately will improve Outcome: Adequate for Discharge   Problem: Nutrition: Goal: Risk of aspiration will decrease Outcome: Adequate for Discharge   Goal: Dietary intake will improve Outcome: Adequate for Discharge   

## 2023-05-13 NOTE — Discharge Summary (Signed)
Physician Discharge Summary  ELECIA REHAGEN WUJ:[id number] DOB: Feb 19, 1956 DOA: 05/10/2023  PCP: Judd Lien, PA-C  Admit date: 05/10/2023 Discharge date: 05/13/2023  Time spent: 60 minutes  Recommendations for Outpatient Follow-up:  Follow-up with Dr. Alton Revere, neurology as scheduled 08/16/2023. Follow-up with Irving Copas H, PA-C in 2 weeks.  On follow-up patient will need a basic metabolic profile done to follow-up on electrolytes and renal function.  Patient's diabetes and blood pressure need to be followed up upon.   Discharge Diagnoses:  Principal Problem:   Facial droop Active Problems:   CVA (cerebral vascular accident) (HCC)   Uncontrolled type 2 diabetes mellitus with hyperglycemia, with long-term current use of insulin (HCC)   Hypertension   Pure hypercholesterolemia   Acquired autoimmune hypothyroidism   Asthma   Dementia without behavioral disturbance (HCC)   Discharge Condition: Stable and improved.  Diet recommendation: Heart healthy  Filed Weights   05/10/23 1857  Weight: 75.3 kg    History of present illness:  HPI per Dr. Elenora Gamma is a 67 y.o. female with medical history significant of hypertension, hyperlipidemia, diabetes mellitus type 2, and amyloid, and  early dementia presents with complaints of right-sided facial droop and speech disturbance.  Patient's last known well was 2 days ago when she went to sleep.  She woke up yesterday morning and had been on the phone with no one at her primary care's office in regards to setting up a repeat lung test when she noticed that her speech was off.  Patient noted that it felt ike her lip was swollen on the right side.  Her sister had come over  to fix her medications when she noticed that the right side of her face was drooping injected through the emergency department for further evaluation.  Patient denies having any recent fever, chills, chest pain, palpitations, nausea, vomiting, diarrhea, or dysuria  symptoms.   In the emergency department patient was noted to be afebrile with mild tachypnea, blood pressure elevated up to 157/60, and all other vital signs relatively maintained.  CT scan of the head noted asymmetric hypoattenuation within the right insula concerning for acute infarct.  Patient was not a candidate for thrombolytics due to being outside of the window.  CT angiogram of the head did not note any large vessel occlusion.  Labs relatively unremarkable except for BUN 30, creatinine 0.92, glucose 146.  Urinalysis showed no signs of infection but noted to have elevated specific gravity.Case had been discussed with neurology who recommended starting patient on aspirin but not Plavix.   Hospital Course:  #1 small acute infarct posterior left frontal lobe -Patient had presented with some dysarthric speech, right facial weakness and drooling. -CT head done with asymmetric hypoattenuation within the right insula concerning for acute infarct. -CT angiogram head and neck done with no LVO. -MRI brain done with small acute infarct of the posterior left frontal lobe, chronic microhemorrhage thought to be secondary to chronic hypertension or amyloid angiopathy. -2D echo done with normal EF,NWMA, intra-atrial septum not well-visualized. -MRA head done with no large vessel occlusion, high-grade stenosis, or aneurysm of the intracranial circulation.  Redemonstrated scattered foci of susceptibility predominantly at the gray-white junction suggestive of cerebral amyloid angiopathy. -Carotid ultrasound done with right ICA 40 to 59% stenosis, left ICA 1 to 39% stenosis. -Patient started on aspirin for secondary stroke prophylaxis. -Fasting lipid panel with a total cholesterol 135, LDL of 89. -Patient maintained on home regimen Crestor 20 mg daily.  -  Hemoglobin A1c 7.4. -Patient seen by PT/OT/SLP who recommended outpatient PT, outpatient OT, outpatient SLP. -Patient seen in consultation by neurology who  recommended aspirin and statin for secondary stroke prophylaxis with outpatient follow-up with primary neurologist as scheduled Dr. Alton Revere at [facility] on 08/16/2023. -Patient improved clinically and will be discharged in stable improved condition.   2.  Diabetes mellitus type 2 with long-term insulin use -Hemoglobin A1c 7.4. -Patient noted to be on a home regimen of tresiba 60 units daily, NovoLog 20 units 3 times daily with meals and Ozempic. -Patient noted to have received Semglee 60 units x 1 dose on admission. -CBG noted at 62 in the morning after admission, patient subsequently placed on 10 units of Semglee daily as well as SSI.   -Patient will be discharged on Tresiba 10 units daily, 3 units meal coverage NovoLog.   -Outpatient follow-up.    3.  Hypothyroidism -Patient maintained on home regimen Synthroid.   4.  Hypertension -Permissive hypertension. -Patient's antihypertensive medications were held.  -Patient hydrated with IV fluids.  -Outpatient follow-up.    5.  Hyperlipidemia -Fasting lipid panel with a total cholesterol 135, LDL of 89. -Patient maintained on home regimen Crestor.    6.  Asthma -Patient maintained on MDI.   7.  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 -Patient maintained on home regimen Aricept.     Procedures: 2D echo 05/07/2023 CT head 05/10/2023 CT angiogram head and neck 05/10/2023 Carotid Dopplers 05/12/2023 MRI brain 05/11/2023 MRA head 05/12/2023  Consultations: Neurology: Dr.Khaliqdina 05/11/2023    Discharge Exam: Vitals:   05/13/23 0837 05/13/23 1117  BP: (!) 140/51 (!) 141/62  Pulse: 72 76  Resp: 18 18  Temp: 98.2 F (36.8 C) 98 F (36.7 C)  SpO2: 100% 100%    General: NAD Cardiovascular: RRR no  m/r/g Respiratory: Lungs sound bilaterally.  No wheezes, crackles, rhonchi.  Fair air movement.  Speaking full sentences.  Discharge Instructions   Discharge Instructions     Ambulatory referral to Neurology   Complete by: As directed    An appointment is requested in approximately: 4 weeks   Ambulatory referral to Occupational Therapy   Complete by: As directed    Ambulatory referral to Physical Therapy   Complete by: As directed    Vestibular   Ambulatory referral to Speech Therapy   Complete by: As directed    Diet - low sodium heart healthy   Complete by: As directed    Increase activity slowly   Complete by: As directed       Allergies as of 05/13/2023       Reactions   Bee Venom Anaphylaxis   Oysters [shellfish Allergy] Anaphylaxis, Swelling, Other (See Comments)   Throat swelling   Penicillins Anaphylaxis, Rash   Has patient had a PCN reaction causing immediate rash, facial/tongue/throat swelling, SOB or lightheadedness with hypotension: Yes Has patient had a PCN reaction causing severe rash involving mucus membranes or skin necrosis: No Has patient had a PCN reaction that required hospitalization Yes Has patient had a PCN reaction occurring within the last 10 years: No If all of the above answers are "NO", then may proceed with Cephalosporin use. Has patient had a PCN reaction causing immediate rash, facial/tongue/throat swelling, SOB or lightheadedness with hypotension: Yes, Has  patient had a PCN reaction causing severe rash involving mucus membranes or skin necrosis: No, Has patient had a PCN reaction that required hospitalization Yes, Has patient had a PCN reaction occurring within the last 10 years: No, If all of the above answers are "NO", then may proceed with Cephalosporin use.   Olive Oil Swelling, Other (See Comments)   Throat swelling; includes olives   Sulfa Antibiotics Diarrhea   Sulfasalazine Diarrhea   Tramadol Other (See Comments)   Low BP         Medication List     STOP taking these medications    hydrochlorothiazide 12.5 MG tablet Commonly known as: HYDRODIURIL       TAKE these medications    albuterol 108 (90 Base) MCG/ACT inhaler Commonly known as: VENTOLIN HFA Inhale 1-2 puffs into the lungs every 6 (six) hours as needed for wheezing or shortness of breath.   amLODipine 10 MG tablet Commonly known as: NORVASC Take 10 mg by mouth daily.   aspirin 81 MG chewable tablet Chew 1 tablet (81 mg total) by mouth daily. Start taking on: May 14, 2023   budesonide-formoterol 160-4.5 MCG/ACT inhaler Commonly known as: SYMBICORT Inhale 2 puffs into the lungs 2 (two) times daily.   Dexcom G7 Sensor Misc Inject 1 each into the skin See admin instructions. Inject 1 sensor to the skin every 10 days for continuous glucose monitoring.   diclofenac 50 MG EC tablet Commonly known as: VOLTAREN Take 50 mg by mouth 2 (two) times daily.   donepezil 5 MG tablet Commonly known as: ARICEPT Take 5 mg by mouth at bedtime.   fluticasone 50 MCG/ACT nasal spray Commonly known as: FLONASE Place 2 sprays into both nostrils daily as needed for allergies.   Insulin Aspart FlexPen 100 UNIT/ML Commonly known as: NOVOLOG Inject 3 Units into the skin 3 (three) times daily with meals. What changed: how much to take   insulin degludec 100 UNIT/ML FlexTouch Pen Commonly known as: TRESIBA Inject 10 Units into the skin daily. What changed: how much to take   Insulin Pen Needle 31G X 5 MM Misc Use to inject insulin once daily   levothyroxine 50 MCG tablet Commonly known as: SYNTHROID Take 50 mcg by mouth daily before breakfast.   losartan 100 MG tablet Commonly known as: COZAAR Take 1 tablet (100 mg total) by mouth daily. Start taking on: May 16, 2023 What changed: These instructions start on May 16, 2023. If you are unsure what to do until then, ask your doctor or other care provider.   montelukast 10 MG tablet Commonly known  as: SINGULAIR Take 10 mg by mouth daily.   Ozempic (2 MG/DOSE) 8 MG/3ML Sopn Generic drug: Semaglutide (2 MG/DOSE) Inject 0.75 mLs (2 mg total) into the skin every 7 days. What changed: additional instructions   pioglitazone 45 MG tablet Commonly known as: ACTOS Take 45 mg by mouth daily.   rosuvastatin 20 MG tablet Commonly known as: CRESTOR Take 20 mg by mouth daily.   sertraline 50 MG tablet Commonly known as: ZOLOFT Take 50 mg by mouth daily.   Vitamin D (Ergocalciferol) 1.25 MG (50000 UNIT) Caps capsule Commonly known as: DRISDOL Take 50,000 Units by mouth once a week. Thursdays       Allergies  Allergen Reactions   Bee Venom Anaphylaxis   Oysters [Shellfish Allergy] Anaphylaxis, Swelling and Other (See Comments)    Throat swelling   Penicillins Anaphylaxis and Rash    Has patient had a  PCN reaction causing immediate rash, facial/tongue/throat swelling, SOB or lightheadedness with hypotension: Yes  Has patient had a PCN reaction causing severe rash involving mucus membranes or skin necrosis: No  Has patient had a PCN reaction that required hospitalization Yes  Has patient had a PCN reaction occurring within the last 10 years: No  If all of the above answers are "NO", then may proceed with Cephalosporin use.  Has patient had a PCN reaction causing immediate rash, facial/tongue/throat swelling, SOB or lightheadedness with hypotension: Yes, Has patient had a PCN reaction causing severe rash involving mucus membranes or skin necrosis: No, Has patient had a PCN reaction that required hospitalization Yes, Has patient had a PCN reaction occurring within the last 10 years: No, If all of the above answers are "NO", then may proceed with Cephalosporin use.   Olive Oil Swelling and Other (See Comments)    Throat swelling; includes olives   Sulfa Antibiotics Diarrhea   Sulfasalazine Diarrhea   Tramadol Other (See Comments)    Low BP    Follow-up Information     Iven Finn., MD. Go on 08/16/2023.   Specialty: Neurology Contact information: [address] [place] [address] [phone]         Irving Copas H, [place]. Schedule an appointment as soon as possible for a visit in 2 week(s).   Specialty: Physician Assistant Contact information: [phone] [place] [address] [phone]         [facility] Follow up.   Specialty: Rehabilitation Why: Physical Therapy, Occupational Therapy, Speech Therapy. Call to make a follow up appointment when you are discharged. Contact information: [address] [facility] [phone] mc [facility] [address] [phone]                 The results of significant diagnostics from this hospitalization (including imaging, microbiology, ancillary and laboratory) are listed below for reference.    Significant Diagnostic Studies: VAS US CAROTID  Result Date: 05/13/2023 Carotid Arterial Duplex Study Patient Name:  CARLETTE PALMATIER  Date of Exam:   05/12/2023 Medical Rec #: [id number]     Accession #:    [id number] Date of Birth: Jun 07, 1956    Patient Gender: F Patient Age:   38 years Exam Location:  [facility] Procedure:      VAS US CAROTID Referring Phys: Ramiro Harvest --------------------------------------------------------------------------------  Indications:       CVA, Speech disturbance and right facial droop. Risk Factors:      Hypertension, hyperlipidemia, Diabetes. Comparison Study:  No previous study. Performing Technologist: McKayla Maag RVT, [place]  Examination Guidelines: A complete evaluation includes B-mode imaging, spectral Doppler, color Doppler, and power Doppler as needed of all accessible portions of each vessel. Bilateral testing is considered an integral part of a complete examination. Limited examinations for reoccurring indications may be performed as noted.  Right Carotid Findings:  +----------+--------+--------+--------+---------------------+------------------+           PSV cm/sEDV cm/sStenosisPlaque Description   Comments           +----------+--------+--------+--------+---------------------+------------------+ CCA Prox  82      6                                    intimal thickening +----------+--------+--------+--------+---------------------+------------------+ CCA Distal81      10  intimal thickening +----------+--------+--------+--------+---------------------+------------------+ ICA Prox  148     16      40-59%  irregular,                                                                heterogenous and                                                          calcific                                +----------+--------+--------+--------+---------------------+------------------+ ICA Mid   117     21                                                      +----------+--------+--------+--------+---------------------+------------------+ ICA Distal85      15                                                      +----------+--------+--------+--------+---------------------+------------------+ ECA       100     0                                                       +----------+--------+--------+--------+---------------------+------------------+ +----------+--------+-------+----------------+-------------------+           PSV cm/sEDV cmsDescribe        Arm Pressure (mmHG) +----------+--------+-------+----------------+-------------------+ [id number]            Multiphasic, WNL                    +----------+--------+-------+----------------+-------------------+ +---------+--------+--+--------+-+---------+ VertebralPSV cm/s41EDV cm/s9Antegrade +---------+--------+--+--------+-+---------+  Left Carotid Findings:  +----------+-------+-------+--------+---------------------------------+--------+           PSV    EDV    StenosisPlaque Description               Comments           cm/s   cm/s                                                     +----------+-------+-------+--------+---------------------------------+--------+ CCA Prox  114    13                                                       +----------+-------+-------+--------+---------------------------------+--------+  CCA Distal69     10                                                       +----------+-------+-------+--------+---------------------------------+--------+ ICA Prox  83     10     1-39%   irregular, heterogenous and                                               calcific                                  +----------+-------+-------+--------+---------------------------------+--------+ ICA Distal64     13                                                       +----------+-------+-------+--------+---------------------------------+--------+ ECA       94                                                              +----------+-------+-------+--------+---------------------------------+--------+ +----------+--------+--------+----------------+-------------------+           PSV cm/sEDV cm/sDescribe        Arm Pressure (mmHG) +----------+--------+--------+----------------+-------------------+ [id number]             Multiphasic, WNL                    +----------+--------+--------+----------------+-------------------+ +---------+--------+--+--------+--+---------+ VertebralPSV cm/s42EDV cm/s10Antegrade +---------+--------+--+--------+--+---------+   Summary: Right Carotid: Velocities in the right ICA are consistent with a 40-59%                stenosis. Left Carotid: Velocities in the left ICA are consistent with a 1-39% stenosis. Vertebrals:  Bilateral vertebral arteries demonstrate antegrade flow.  Subclavians: Normal flow hemodynamics were seen in bilateral subclavian              arteries. *See table(s) above for measurements and observations.  Electronically signed by Delia Heady MD on 05/13/2023 at 9:30:51 AM.    Final    MR ANGIO HEAD WO CONTRAST  Result Date: 05/12/2023 CLINICAL DATA:  Stroke, follow-up. EXAM: MRA HEAD WITHOUT CONTRAST TECHNIQUE: Angiographic images of the Circle of Willis were acquired using MRA technique without intravenous contrast. COMPARISON:  MRI brain 05/11/2023. FINDINGS: Anterior circulation: Intracranial ICAs are patent without stenosis or aneurysm. The proximal ACAs and MCAs are patent without stenosis or aneurysm. Distal branches are symmetric. Posterior circulation: Visualized portions of the distal vertebral arteries and basilar artery are patent without stenosis or aneurysm. The SCAs, AICAs and PICAs are patent proximally. The PCAs are patent proximally without stenosis or aneurysm. Distal branches are symmetric. Anatomic variants: None. Other: Redemonstrated scattered foci of susceptibility predominantly at the gray-white junction, suggestive of cerebral amyloid angiopathy. IMPRESSION: 1. No large vessel occlusion, high-grade stenosis, or aneurysm of the intracranial circulation. 2.  Redemonstrated scattered foci of susceptibility predominantly at the gray-white junction, suggestive of cerebral amyloid angiopathy. Electronically Signed   By: Orvan Falconer M.D.   On: 05/12/2023 14:18   ECHOCARDIOGRAM COMPLETE  Result Date: 05/12/2023    ECHOCARDIOGRAM REPORT   Patient Name:   GRACE SELLES Date of Exam: 05/12/2023 Medical Rec #:  [id number]    Height:       62.0 in Accession #:    [id number]   Weight:       166.0 lb Date of Birth:  27-Jan-1956   BSA:          1.766 m Patient Age:    39 years     BP:           126/47 mmHg Patient Gender: F            HR:           71 bpm. Exam Location:  Inpatient Procedure: 2D Echo, Cardiac Doppler and Color Doppler Indications:     Stroke  History:        Patient has no prior history of Echocardiogram examinations.                 Risk Factors:Diabetes and Hypertension.  Sonographer:    Lucy Antigua Referring Phys: [phone] RONDELL A SMITH IMPRESSIONS  1. Left ventricular ejection fraction, by estimation, is 60 to 65%. The left ventricle has normal function. The left ventricle has no regional wall motion abnormalities. There is mild left ventricular hypertrophy. Left ventricular diastolic parameters are indeterminate.  2. Right ventricular systolic function was not well visualized. The right ventricular size is not well visualized. Tricuspid regurgitation signal is inadequate for assessing PA pressure.  3. The mitral valve is normal in structure. No evidence of mitral valve regurgitation. No evidence of mitral stenosis.  4. The aortic valve has an indeterminant number of cusps. Aortic valve regurgitation is trivial. No aortic stenosis is present. FINDINGS  Left Ventricle: Left ventricular ejection fraction, by estimation, is 60 to 65%. The left ventricle has normal function. The left ventricle has no regional wall motion abnormalities. The left ventricular internal cavity size was normal in size. There is  mild left ventricular hypertrophy. Left ventricular diastolic parameters are indeterminate. Right Ventricle: The right ventricular size is not well visualized. Right vetricular wall thickness was not well visualized. Right ventricular systolic function was not well visualized. Tricuspid regurgitation signal is inadequate for assessing PA pressure. Left Atrium: Left atrial size was not well visualized. Right Atrium: Right atrial size was not well visualized. Pericardium: The pericardium was not well visualized. Mitral Valve: The mitral valve is normal in structure. No evidence of mitral valve regurgitation. No evidence of mitral valve stenosis. Tricuspid Valve: The tricuspid valve is not well visualized. Tricuspid valve regurgitation is  trivial. No evidence of tricuspid stenosis. Aortic Valve: The aortic valve has an indeterminant number of cusps. Aortic valve regurgitation is trivial. No aortic stenosis is present. Aortic valve mean gradient measures 3.0 mmHg. Aortic valve peak gradient measures 4.3 mmHg. Aortic valve area, by VTI measures 2.22 cm. Pulmonic Valve: The pulmonic valve was not well visualized. Pulmonic valve regurgitation is not visualized. No evidence of pulmonic stenosis. Aorta: The aortic root and ascending aorta are structurally normal, with no evidence of dilitation. Venous: The inferior vena cava was not well visualized. IAS/Shunts: The interatrial septum was not well visualized.  LEFT VENTRICLE PLAX 2D LVIDd:         3.70 cm  Diastology LVIDs:         2.50 cm   LV e' medial:    6.42 cm/s LV PW:         1.10 cm   LV E/e' medial:  15.7 LV IVS:        1.10 cm   LV e' lateral:   7.94 cm/s LVOT diam:     1.80 cm   LV E/e' lateral: 12.7 LV SV:         58 LV SV Index:   33 LVOT Area:     2.54 cm  LEFT ATRIUM           Index LA Vol (A4C): 35.7 ml 20.21 ml/m  AORTIC VALVE AV Area (Vmax):    2.62 cm AV Area (Vmean):   2.28 cm AV Area (VTI):     2.22 cm AV Vmax:           104.00 cm/s AV Vmean:          82.200 cm/s AV VTI:            0.260 m AV Peak Grad:      4.3 mmHg AV Mean Grad:      3.0 mmHg LVOT Vmax:         107.00 cm/s LVOT Vmean:        73.700 cm/s LVOT VTI:          0.227 m LVOT/AV VTI ratio: 0.87  AORTA Ao Root diam: 2.50 cm Ao Asc diam:  2.90 cm MITRAL VALVE MV Area (PHT): 2.90 cm     SHUNTS MV Decel Time: 262 msec     Systemic VTI:  0.23 m MV E velocity: 101.00 cm/s  Systemic Diam: 1.80 cm MV A velocity: 95.90 cm/s MV E/A ratio:  1.05 Dina Rich MD Electronically signed by Dina Rich MD Signature Date/Time: 05/12/2023/12:31:07 PM    Final    MR BRAIN WO CONTRAST  Result Date: 05/11/2023 CLINICAL DATA:  Neuro deficit, acute, stroke suspected EXAM: MRI HEAD WITHOUT CONTRAST TECHNIQUE: Multiplanar, multiecho  pulse sequences of the brain and surrounding structures were obtained without intravenous contrast. COMPARISON:  None Available. FINDINGS: Brain: Small acute infarct in the posterior left frontal lobe. No significant mass effect. Severe patchy T2/FLAIR hyperintensity in the white matter, compatible with chronic microvascular ischemic disease. Numerous small foci of suspect artifact, predominantly within the posterior temporal lobes, parietal, and occipital lobes as well as the cerebellum. Also focus within the right thalamus. No evidence of acute hemorrhage. Vascular: Major arterial flow voids are maintained. Skull and upper cervical spine: Normal marrow signal. Sinuses/Orbits: Negative. IMPRESSION: 1. Small acute infarct in the posterior left frontal lobe. 2. Severe chronic microvascular ischemic disease. 3. Numerous chronic microhemorrhages, predominantly posterior. These could be related to chronic hypertension or amyloid angiopathy. Electronically Signed   By: Feliberto Harts M.D.   On: 05/11/2023 16:57   CT ANGIO HEAD NECK W WO CM  Result Date: 05/10/2023 CLINICAL DATA:  Initial evaluation for stroke. EXAM: CT ANGIOGRAPHY HEAD AND NECK WITH AND WITHOUT CONTRAST TECHNIQUE: Multidetector CT imaging of the head and neck was performed using the standard protocol during bolus administration of intravenous contrast. Multiplanar CT image reconstructions and MIPs were obtained to evaluate the vascular anatomy. Carotid stenosis measurements (when applicable) are obtained utilizing NASCET criteria, using the distal internal carotid diameter as the denominator. RADIATION DOSE REDUCTION: This exam was performed according to the departmental dose-optimization program which includes automated exposure control, adjustment of the  mA and/or kV according to patient size and/or use of iterative reconstruction technique. CONTRAST:  75mL OMNIPAQUE IOHEXOL 350 MG/ML SOLN COMPARISON:  Prior study from earlier the same day.  FINDINGS: CTA NECK FINDINGS Aortic arch: Visualized aortic arch normal in caliber with standard branch pattern. No stenosis about the origin the great vessels. Right carotid system: Right common and internal carotid arteries are patent without dissection. Calcified atheromatous plaque about the proximal cervical right ICA with associated stenosis of up to 50% by NASCET criteria. Left carotid system: Left common and internal carotid arteries are patent without dissection. Mild atheromatous change about the left carotid bulb without stenosis. Vertebral arteries: Both vertebral arteries arise from subclavian arteries. No proximal subclavian artery stenosis. Vertebral arteries are patent without stenosis or dissection. Skeleton: No worrisome osseous lesions. Mild for age cervical spondylosis. Other neck: Few thyroid nodules noted, largest of which measures 2.6 cm at the isthmus (series 8, image 24) This has been evaluated on previous imaging. (ref: J Am Coll Radiol. 2015 Feb;12(2): 143-50). Upper chest: No acute finding. Review of the MIP images confirms the above findings CTA HEAD FINDINGS Anterior circulation: Examination technically limited by poor timing of the contrast bolus. Both internal carotid arteries are patent through the siphons without stenosis or other abnormality. A1 segments, anterior communicating complex common anterior cerebral arteries patent without stenosis. No M1 stenosis or occlusion. No proximal MCA branch occlusion or high-grade stenosis. Distal MCA branches perfused and symmetric. Posterior circulation: Both V4 segments widely patent. Both PICA patent. Basilar widely patent without stenosis. Superior cerebellar and posterior cerebral arteries patent bilaterally. Venous sinuses: Patent allowing for timing the contrast bolus. Anatomic variants: None significant.  No aneurysm. Review of the MIP images confirms the above findings IMPRESSION: 1. Negative CTA for large vessel occlusion or other  emergent finding. 2. Calcified atheromatous plaque about the proximal cervical right ICA with associated stenosis of up to 50% by NASCET criteria. Electronically Signed   By: Rise Mu M.D.   On: 05/10/2023 23:37   CT Head Wo Contrast  Result Date: 05/10/2023 CLINICAL DATA:  Facial paralysis/weakness and slurred speech EXAM: CT HEAD WITHOUT CONTRAST TECHNIQUE: Contiguous axial images were obtained from the base of the skull through the vertex without intravenous contrast. RADIATION DOSE REDUCTION: This exam was performed according to the departmental dose-optimization program which includes automated exposure control, adjustment of the mA and/or kV according to patient size and/or use of iterative reconstruction technique. COMPARISON:  MRI head report from 08/11/2022 FINDINGS: Brain: Asymmetric hypoattenuation within the right insula (series 2/image 15). No comparison images are available and this may be chronic however is concerning for acute infarct. No intracranial hemorrhage or mass effect. No hydrocephalus. No extra-axial fluid collection. Generalized cerebral atrophy. Extensive ill-defined hypoattenuation within the cerebral white matter is nonspecific but consistent with chronic small vessel ischemic disease. Vascular: No hyperdense vessel. Intracranial arterial calcification. Skull: No fracture or focal lesion. Sinuses/Orbits: No acute finding. Paranasal sinuses and mastoid air cells are well aerated. Other: None. IMPRESSION: 1. Asymmetric hypoattenuation within the right insula. This is concerning for acute infarct. MRI is recommended for further evaluation. 2. Extensive chronic small vessel ischemic disease. Critical Value/emergent results were called by telephone at the time of interpretation on 05/10/2023 at 8:04 pm to provider MATTHEW TRIFAN , who verbally acknowledged these results. Electronically Signed   By: Minerva Fester M.D.   On: 05/10/2023 20:10    Microbiology: No results found  for this or any previous visit (from the past  240 hour(s)).   Labs: Basic Metabolic Panel: Recent Labs  Lab 05/10/23 2000 05/12/23 0320 05/13/23 0534  NA 136 138 137  K 3.8 3.5 4.3  CL 103 101 107  CO2 25 26 24   GLUCOSE 146* 78 86  BUN 30* 16 15  CREATININE 0.92 0.65 0.63  CALCIUM 9.1 9.0 8.7*   Liver Function Tests: No results for input(s): "AST", "ALT", "ALKPHOS", "BILITOT", "PROT", "ALBUMIN" in the last 168 hours. No results for input(s): "LIPASE", "AMYLASE" in the last 168 hours. No results for input(s): "AMMONIA" in the last 168 hours. CBC: Recent Labs  Lab 05/10/23 2000 05/12/23 0320  WBC 9.4 8.5  NEUTROABS 5.9  --   HGB 13.7 13.6  HCT 41.5 40.7  MCV 87.9 87.5  PLT 335 301   Cardiac Enzymes: No results for input(s): "CKTOTAL", "CKMB", "CKMBINDEX", "TROPONINI" in the last 168 hours. BNP: BNP (last 3 results) No results for input(s): "BNP" in the last 8760 hours.  ProBNP (last 3 results) No results for input(s): "PROBNP" in the last 8760 hours.  CBG: Recent Labs  Lab 05/12/23 1206 05/12/23 1624 05/12/23 2100 05/13/23 0713 05/13/23 1119  GLUCAP 112* 183* 120* 79 116*       Signed:  Ramiro Harvest MD.  [facility] 05/13/2023, 3:06 PM

## 2023-08-09 ENCOUNTER — Other Ambulatory Visit: Payer: Self-pay

## 2023-08-09 ENCOUNTER — Encounter (HOSPITAL_BASED_OUTPATIENT_CLINIC_OR_DEPARTMENT_OTHER): Payer: Self-pay | Admitting: Urology

## 2023-08-09 ENCOUNTER — Emergency Department (HOSPITAL_BASED_OUTPATIENT_CLINIC_OR_DEPARTMENT_OTHER)
Admission: EM | Admit: 2023-08-09 | Discharge: 2023-08-09 | Disposition: A | Discharge status: Home / Self Care | Payer: Medicare HMO | Attending: Emergency Medicine | Admitting: Emergency Medicine

## 2023-08-09 ENCOUNTER — Emergency Department (HOSPITAL_BASED_OUTPATIENT_CLINIC_OR_DEPARTMENT_OTHER): Payer: Medicare HMO

## 2023-08-09 DIAGNOSIS — Z7982 Long term (current) use of aspirin: Secondary | ICD-10-CM | POA: Diagnosis not present

## 2023-08-09 DIAGNOSIS — M25562 Pain in left knee: Secondary | ICD-10-CM | POA: Insufficient documentation

## 2023-08-09 DIAGNOSIS — M25561 Pain in right knee: Secondary | ICD-10-CM | POA: Insufficient documentation

## 2023-08-09 DIAGNOSIS — Z794 Long term (current) use of insulin: Secondary | ICD-10-CM | POA: Diagnosis not present

## 2023-08-09 DIAGNOSIS — E119 Type 2 diabetes mellitus without complications: Secondary | ICD-10-CM | POA: Diagnosis not present

## 2023-08-09 DIAGNOSIS — Z79899 Other long term (current) drug therapy: Secondary | ICD-10-CM | POA: Insufficient documentation

## 2023-08-09 DIAGNOSIS — I10 Essential (primary) hypertension: Secondary | ICD-10-CM | POA: Diagnosis not present
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
## 2023-08-09 NOTE — ED Notes (Signed)
Pt ambulated to restroom. Updated on delay

## 2023-08-09 NOTE — Discharge Instructions (Addendum)
You were seen in the emerged from today for evaluation of your bilateral knee pain.  Your x-ray of your right shows some early arthritic changes.  I would like for you to follow-up with an orthopedic provider as you may need MRIs of your knees for further evaluation.  Please make sure to call to schedule an appointment.  Please take Tylenol 1000mg  every 6 hours as needed for pain.  You can also try topical Biofreeze or lidocaine patches to the area as well.  If you have any worsening pain, swelling, fevers, inability to move your knees, please return to your nearest emergency room for evaluation.  Otherwise, if you have any other concerns, new or worsening symptoms, please return to the nearest emergency department for reevaluation.  Contact a doctor if: The knee pain does not stop. The knee pain changes or gets worse. You have a fever along with knee pain. Your knee is red or feels warm when you touch it. Your knee gives out or locks up. Get help right away if: Your knee swells, and the swelling gets worse. You cannot move your knee. You have very bad knee pain that does not get better with pain medicine.

## 2023-08-09 NOTE — ED Notes (Signed)
Patient transported to X-ray 
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
## 2023-11-16 ENCOUNTER — Encounter (HOSPITAL_COMMUNITY): Payer: Self-pay

## 2023-11-16 ENCOUNTER — Other Ambulatory Visit: Payer: Self-pay

## 2023-11-16 ENCOUNTER — Emergency Department (HOSPITAL_COMMUNITY): Payer: Medicare HMO

## 2023-11-16 ENCOUNTER — Observation Stay (HOSPITAL_COMMUNITY)
Admission: EM | Admit: 2023-11-16 | Discharge: 2023-11-25 | Disposition: A | Discharge status: Other Acute Inpatient Hospital | Payer: Medicare HMO | Attending: Emergency Medicine | Admitting: Emergency Medicine

## 2023-11-16 DIAGNOSIS — R4182 Altered mental status, unspecified: Secondary | ICD-10-CM | POA: Diagnosis present

## 2023-11-16 DIAGNOSIS — F22 Delusional disorders: Secondary | ICD-10-CM | POA: Diagnosis present

## 2023-11-16 DIAGNOSIS — F0390 Unspecified dementia without behavioral disturbance: Secondary | ICD-10-CM | POA: Insufficient documentation

## 2023-11-16 DIAGNOSIS — E119 Type 2 diabetes mellitus without complications: Secondary | ICD-10-CM | POA: Diagnosis not present

## 2023-11-16 DIAGNOSIS — R45850 Homicidal ideations: Secondary | ICD-10-CM | POA: Insufficient documentation

## 2023-11-16 DIAGNOSIS — Z7984 Long term (current) use of oral hypoglycemic drugs: Secondary | ICD-10-CM | POA: Insufficient documentation

## 2023-11-16 DIAGNOSIS — R41841 Cognitive communication deficit: Secondary | ICD-10-CM | POA: Diagnosis not present

## 2023-11-16 DIAGNOSIS — E669 Obesity, unspecified: Secondary | ICD-10-CM | POA: Insufficient documentation

## 2023-11-16 DIAGNOSIS — E039 Hypothyroidism, unspecified: Secondary | ICD-10-CM | POA: Insufficient documentation

## 2023-11-16 DIAGNOSIS — N3000 Acute cystitis without hematuria: Secondary | ICD-10-CM | POA: Diagnosis not present

## 2023-11-16 DIAGNOSIS — Z794 Long term (current) use of insulin: Secondary | ICD-10-CM | POA: Diagnosis not present

## 2023-11-16 DIAGNOSIS — N390 Urinary tract infection, site not specified: Secondary | ICD-10-CM | POA: Diagnosis not present

## 2023-11-16 DIAGNOSIS — J45909 Unspecified asthma, uncomplicated: Secondary | ICD-10-CM | POA: Insufficient documentation

## 2023-11-16 DIAGNOSIS — I10 Essential (primary) hypertension: Secondary | ICD-10-CM | POA: Insufficient documentation

## 2023-11-16 DIAGNOSIS — F323 Major depressive disorder, single episode, severe with psychotic features: Secondary | ICD-10-CM | POA: Diagnosis present

## 2023-11-16 DIAGNOSIS — Z7985 Long-term (current) use of injectable non-insulin antidiabetic drugs: Secondary | ICD-10-CM | POA: Insufficient documentation

## 2023-11-16 DIAGNOSIS — Z6831 Body mass index (BMI) 31.0-31.9, adult: Secondary | ICD-10-CM | POA: Insufficient documentation

## 2023-11-16 DIAGNOSIS — Z79899 Other long term (current) drug therapy: Secondary | ICD-10-CM | POA: Insufficient documentation

## 2023-11-16 DIAGNOSIS — Z8673 Personal history of transient ischemic attack (TIA), and cerebral infarction without residual deficits: Secondary | ICD-10-CM | POA: Diagnosis not present

## 2023-11-16 LAB — CBC WITH DIFFERENTIAL/PLATELET
Abs Immature Granulocytes: 0.02 10*3/uL (ref 0.00–0.07)
Basophils Absolute: 0.1 10*3/uL (ref 0.0–0.1)
Basophils Relative: 1 %
Eosinophils Absolute: 0.1 10*3/uL (ref 0.0–0.5)
Eosinophils Relative: 1 %
HCT: 42.7 % (ref 36.0–46.0)
Hemoglobin: 14.6 g/dL (ref 12.0–15.0)
Immature Granulocytes: 0 %
Lymphocytes Relative: 20 %
Lymphs Abs: 1.5 10*3/uL (ref 0.7–4.0)
MCH: 29.4 pg (ref 26.0–34.0)
MCHC: 34.2 g/dL (ref 30.0–36.0)
MCV: 85.9 fL (ref 80.0–100.0)
Monocytes Absolute: 0.5 10*3/uL (ref 0.1–1.0)
Monocytes Relative: 6 %
Neutro Abs: 5.5 10*3/uL (ref 1.7–7.7)
Neutrophils Relative %: 72 %
Platelets: 229 10*3/uL (ref 150–400)
RBC: 4.97 MIL/uL (ref 3.87–5.11)
RDW: 13 % (ref 11.5–15.5)
WBC: 7.7 10*3/uL (ref 4.0–10.5)
nRBC: 0 % (ref 0.0–0.2)

## 2023-11-16 LAB — RAPID URINE DRUG SCREEN, HOSP PERFORMED
Amphetamines: NOT DETECTED
Barbiturates: NOT DETECTED
Benzodiazepines: NOT DETECTED
Cocaine: NOT DETECTED
Opiates: NOT DETECTED
Tetrahydrocannabinol: NOT DETECTED

## 2023-11-16 LAB — COMPREHENSIVE METABOLIC PANEL
ALT: 25 U/L (ref 0–44)
AST: 27 U/L (ref 15–41)
Albumin: 4.2 g/dL (ref 3.5–5.0)
Alkaline Phosphatase: 77 U/L (ref 38–126)
Anion gap: 8 (ref 5–15)
BUN: 17 mg/dL (ref 8–23)
CO2: 24 mmol/L (ref 22–32)
Calcium: 9.5 mg/dL (ref 8.9–10.3)
Chloride: 102 mmol/L (ref 98–111)
Creatinine, Ser: 0.67 mg/dL (ref 0.44–1.00)
GFR, Estimated: >60 mL/min (ref >60)
Glucose, Bld: 339 mg/dL — ABNORMAL HIGH (ref 70–99)
Potassium: 3.7 mmol/L (ref 3.5–5.1)
Sodium: 134 mmol/L — ABNORMAL LOW (ref 135–145)
Total Bilirubin: 1 mg/dL (ref <1.2)
Total Protein: 7.2 g/dL (ref 6.5–8.1)

## 2023-11-16 LAB — URINALYSIS, ROUTINE W REFLEX MICROSCOPIC
Bilirubin Urine: NEGATIVE
Glucose, UA: >=500 mg/dL — AB
Hgb urine dipstick: NEGATIVE
Ketones, ur: 5 mg/dL — AB
Nitrite: NEGATIVE
Protein, ur: NEGATIVE mg/dL
Specific Gravity, Urine: 1.03 (ref 1.005–1.030)
pH: 5 (ref 5.0–8.0)

## 2023-11-16 LAB — GLUCOSE, CAPILLARY: Glucose-Capillary: 270 mg/dL — ABNORMAL HIGH (ref 70–99)

## 2023-11-16 LAB — ETHANOL: Alcohol, Ethyl (B): <10 mg/dL (ref <10)

## 2023-11-16 MED ORDER — NITROFURANTOIN MACROCRYSTAL 50 MG PO CAPS
50.0000 mg | ORAL_CAPSULE | Freq: Once | ORAL | Status: AC
  Administered 2023-11-16: 50 mg via ORAL
  Filled 2023-11-16: qty 1

## 2023-11-16 MED ORDER — ROSUVASTATIN CALCIUM 10 MG PO TABS
20.0000 mg | ORAL_TABLET | Freq: Every day | ORAL | Status: DC
  Administered 2023-11-17 – 2023-11-25 (×9): 20 mg via ORAL
  Filled 2023-11-16 (×9): qty 2

## 2023-11-16 MED ORDER — ALBUTEROL SULFATE (2.5 MG/3ML) 0.083% IN NEBU: 3.0000 mL | INHALATION_SOLUTION | Freq: Four times a day (QID) | RESPIRATORY_TRACT | Status: DC | PRN

## 2023-11-16 MED ORDER — ACETAMINOPHEN 325 MG PO TABS
650.0000 mg | ORAL_TABLET | Freq: Four times a day (QID) | ORAL | Status: DC | PRN
  Administered 2023-11-18 – 2023-11-23 (×2): 650 mg via ORAL
  Filled 2023-11-16 (×2): qty 2

## 2023-11-16 MED ORDER — AMLODIPINE BESYLATE 10 MG PO TABS
10.0000 mg | ORAL_TABLET | Freq: Every day | ORAL | Status: DC
Start: 2023-11-17 — End: 2023-11-25
  Administered 2023-11-17 – 2023-11-25 (×9): 10 mg via ORAL
  Filled 2023-11-16 (×9): qty 1

## 2023-11-16 MED ORDER — ONDANSETRON HCL 4 MG/2ML IJ SOLN
4.0000 mg | Freq: Four times a day (QID) | INTRAMUSCULAR | Status: DC | PRN
Start: 2023-11-16 — End: 2023-11-25

## 2023-11-16 MED ORDER — INSULIN GLARGINE-YFGN 100 UNIT/ML ~~LOC~~ SOLN
10.0000 [IU] | Freq: Every day | SUBCUTANEOUS | Status: DC
  Administered 2023-11-17 – 2023-11-20 (×5): 10 [IU] via SUBCUTANEOUS
  Filled 2023-11-16 (×10): qty 0.1

## 2023-11-16 MED ORDER — ACETAMINOPHEN 650 MG RE SUPP
650.0000 mg | Freq: Four times a day (QID) | RECTAL | Status: DC | PRN
Start: 2023-11-16 — End: 2023-11-25

## 2023-11-16 MED ORDER — LOSARTAN POTASSIUM 50 MG PO TABS
100.0000 mg | ORAL_TABLET | Freq: Every day | ORAL | Status: DC
Start: 2023-11-17 — End: 2023-11-25
  Administered 2023-11-17 – 2023-11-25 (×9): 100 mg via ORAL
  Filled 2023-11-16 (×9): qty 2

## 2023-11-16 MED ORDER — INSULIN ASPART 100 UNIT/ML IJ SOLN
0.0000 [IU] | Freq: Three times a day (TID) | INTRAMUSCULAR | Status: DC
  Administered 2023-11-17: 11 [IU] via SUBCUTANEOUS
  Administered 2023-11-17: 5 [IU] via SUBCUTANEOUS
  Administered 2023-11-18: 3 [IU] via SUBCUTANEOUS
  Administered 2023-11-18 (×2): 5 [IU] via SUBCUTANEOUS
  Administered 2023-11-19: 3 [IU] via SUBCUTANEOUS
  Administered 2023-11-19: 8 [IU] via SUBCUTANEOUS
  Administered 2023-11-19: 5 [IU] via SUBCUTANEOUS
  Administered 2023-11-20: 2 [IU] via SUBCUTANEOUS
  Administered 2023-11-20: 5 [IU] via SUBCUTANEOUS
  Administered 2023-11-20: 8 [IU] via SUBCUTANEOUS
  Administered 2023-11-21 (×2): 11 [IU] via SUBCUTANEOUS
  Administered 2023-11-21 – 2023-11-22 (×2): 3 [IU] via SUBCUTANEOUS
  Administered 2023-11-22: 2 [IU] via SUBCUTANEOUS
  Administered 2023-11-22: 11 [IU] via SUBCUTANEOUS
  Administered 2023-11-23 (×3): 3 [IU] via SUBCUTANEOUS
  Administered 2023-11-24: 8 [IU] via SUBCUTANEOUS
  Administered 2023-11-24: 5 [IU] via SUBCUTANEOUS
  Filled 2023-11-16: qty 0.15

## 2023-11-16 MED ORDER — INSULIN ASPART 100 UNIT/ML IJ SOLN
0.0000 [IU] | Freq: Every day | INTRAMUSCULAR | Status: DC
  Administered 2023-11-16 – 2023-11-18 (×2): 3 [IU] via SUBCUTANEOUS
  Administered 2023-11-19: 4 [IU] via SUBCUTANEOUS
  Administered 2023-11-20 – 2023-11-21 (×2): 2 [IU] via SUBCUTANEOUS
  Administered 2023-11-22: 3 [IU] via SUBCUTANEOUS
  Administered 2023-11-23: 2 [IU] via SUBCUTANEOUS
  Filled 2023-11-16: qty 0.05

## 2023-11-16 MED ORDER — SERTRALINE HCL 50 MG PO TABS
50.0000 mg | ORAL_TABLET | Freq: Every day | ORAL | Status: DC
  Administered 2023-11-17 – 2023-11-22 (×6): 50 mg via ORAL
  Filled 2023-11-16 (×6): qty 1

## 2023-11-16 MED ORDER — CIPROFLOXACIN IN D5W 400 MG/200ML IV SOLN
400.0000 mg | Freq: Once | INTRAVENOUS | Status: AC
  Administered 2023-11-16: 400 mg via INTRAVENOUS
  Filled 2023-11-16: qty 200

## 2023-11-16 MED ORDER — INSULIN ASPART FLEXPEN 100 UNIT/ML ~~LOC~~ SOPN: 3.0000 [IU] | PEN_INJECTOR | Freq: Three times a day (TID) | SUBCUTANEOUS | Status: DC

## 2023-11-16 MED ORDER — DONEPEZIL HCL 5 MG PO TABS
5.0000 mg | ORAL_TABLET | Freq: Every day | ORAL | Status: DC
  Administered 2023-11-16 – 2023-11-24 (×9): 5 mg via ORAL
  Filled 2023-11-16 (×9): qty 1

## 2023-11-16 MED ORDER — INSULIN ASPART 100 UNIT/ML IJ SOLN
3.0000 [IU] | Freq: Three times a day (TID) | INTRAMUSCULAR | Status: DC
Start: 2023-11-17 — End: 2023-11-20
  Administered 2023-11-17 – 2023-11-20 (×10): 3 [IU] via SUBCUTANEOUS
  Filled 2023-11-16: qty 0.03

## 2023-11-16 MED ORDER — FLUTICASONE PROPIONATE 50 MCG/ACT NA SUSP
2.0000 | Freq: Every day | NASAL | Status: DC | PRN
  Administered 2023-11-21: 2 via NASAL
  Filled 2023-11-16: qty 16

## 2023-11-16 MED ORDER — ENOXAPARIN SODIUM 40 MG/0.4ML IJ SOSY
40.0000 mg | PREFILLED_SYRINGE | INTRAMUSCULAR | Status: DC
  Administered 2023-11-16 – 2023-11-24 (×9): 40 mg via SUBCUTANEOUS
  Filled 2023-11-16 (×9): qty 0.4

## 2023-11-16 MED ORDER — ONDANSETRON HCL 4 MG PO TABS: 4.0000 mg | ORAL_TABLET | Freq: Four times a day (QID) | ORAL | Status: DC | PRN

## 2023-11-16 MED ORDER — SENNOSIDES-DOCUSATE SODIUM 8.6-50 MG PO TABS: 1.0000 | ORAL_TABLET | Freq: Every evening | ORAL | Status: DC | PRN

## 2023-11-16 MED ORDER — MOMETASONE FURO-FORMOTEROL FUM 200-5 MCG/ACT IN AERO
2.0000 | INHALATION_SPRAY | Freq: Two times a day (BID) | RESPIRATORY_TRACT | Status: DC
  Administered 2023-11-17 – 2023-11-25 (×17): 2 via RESPIRATORY_TRACT
  Filled 2023-11-16: qty 8.8

## 2023-11-16 NOTE — ED Provider Notes (Signed)
[place] EMERGENCY DEPARTMENT AT [facility] Provider Note   CSN: [id number] Arrival date & time: 11/16/23  1205     History {Add pertinent medical, surgical, social history, OB history to HPI:1} Chief Complaint  Patient presents with   Altered Mental Status    Natasha Chandler is a 67 y.o. female.  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  Patient was sent here by her neighbors because CBC came more confused.  Patient was seen by the social worker yesterday and the social worker told a family member that the patient should not live by herself.  The patient does not have any children but she has a sister and an aunt who look after.  Patient has no complaints now  The history is provided by the patient and medical records. No language interpreter was used.  Altered Mental Status Presenting symptoms: confusion   Severity:  Mild Most recent episode:  More than 2 days ago Episode history:  Continuous Timing:  Constant Progression:  Worsening Chronicity:  Recurrent Context: not alcohol use   Associated symptoms: no abdominal pain, no hallucinations, no headaches, no rash and no seizures        Home Medications Prior to Admission medications   Medication Sig Start Date End Date Taking? Authorizing Provider  albuterol (PROVENTIL HFA;VENTOLIN HFA) 108 (90 Base) MCG/ACT inhaler Inhale 1-2 puffs into the lungs every 6 (six) hours as needed for wheezing or shortness of breath.  11/26/16   [provider]  amLODipine (NORVASC) 10 MG tablet Take 10 mg by mouth daily.    [provider]  aspirin 81 MG chewable tablet Chew 1 tablet (81 mg total) by mouth daily. 05/14/23   Rodolph Bong, MD  budesonide-formoterol [facility]) 160-4.5 MCG/ACT inhaler Inhale 2 puffs into the lungs 2 (two) times daily.    [provider]  Continuous Glucose Sensor (DEXCOM G7 SENSOR) MISC Inject 1 each  into the skin See admin instructions. Inject 1 sensor to the skin every 10 days for continuous glucose monitoring. 01/05/23   [provider]  diclofenac (VOLTAREN) 50 MG EC tablet Take 50 mg by mouth 2 (two) times daily.    [provider]  donepezil (ARICEPT) 5 MG tablet Take 5 mg by mouth at bedtime. 03/01/23   [provider]  fluticasone (FLONASE) 50 MCG/ACT nasal spray Place 2 sprays into both nostrils daily as needed for allergies. 04/12/23 04/11/24  [provider]  Insulin Aspart FlexPen (NOVOLOG) 100 UNIT/ML Inject 3 Units into the skin 3 (three) times daily with meals. 05/13/23   Rodolph Bong, MD  insulin degludec (TRESIBA) 100 UNIT/ML FlexTouch Pen Inject 10 Units into the skin daily. 05/13/23   Rodolph Bong, MD  Insulin Pen Needle 31G X 5 MM MISC Use to inject insulin once daily 01/25/17   Reather Littler, MD  levothyroxine (SYNTHROID) 50 MCG tablet Take 50 mcg by mouth daily before breakfast. 01/05/23   [provider]  losartan (COZAAR) 100 MG tablet Take 1 tablet (100 mg total) by mouth daily. 05/16/23   Rodolph Bong, MD  montelukast (SINGULAIR) 10 MG tablet Take 10 mg by mouth daily. 03/20/19   [provider]  pioglitazone (ACTOS) 45 MG tablet Take 45 mg by mouth daily. 01/05/23   [provider]  rosuvastatin (CRESTOR) 20 MG tablet Take 20 mg by mouth daily. 03/16/23   [provider]  Semaglutide, 2 MG/DOSE, (  OZEMPIC, 2 MG/DOSE,) 8 MG/3ML SOPN Inject 0.75 mLs (2 mg total) into the skin every 7 days. Patient taking differently: Inject 2 mg into the skin once a week. Fridays 11/08/22     sertraline (ZOLOFT) 50 MG tablet Take 50 mg by mouth daily.    [provider]  Vitamin D, Ergocalciferol, (DRISDOL) 1.25 MG (50000 UNIT) CAPS capsule Take 50,000 Units by mouth once a week. Thursdays 03/01/23   [provider]      Allergies    Bee venom, Oysters [shellfish allergy], Penicillins, Olive oil, Sulfa  antibiotics, Sulfasalazine, and Tramadol    Review of Systems   Review of Systems  Constitutional:  Negative for appetite change and fatigue.  HENT:  Negative for congestion, ear discharge and sinus pressure.   Eyes:  Negative for discharge.  Respiratory:  Negative for cough.   Cardiovascular:  Negative for chest pain.  Gastrointestinal:  Negative for abdominal pain and diarrhea.  Genitourinary:  Negative for frequency and hematuria.  Musculoskeletal:  Negative for back pain.  Skin:  Negative for rash.  Neurological:  Negative for seizures and headaches.  Psychiatric/Behavioral:  Positive for confusion. Negative for hallucinations.     Physical Exam Updated Vital Signs BP (!) 160/57   Pulse 61   Temp 98.4 F (36.9 C)   Resp 17   Ht 5\' 2"  (1.575 m)   SpO2 98%   BMI 30.36 kg/m  Physical Exam Vitals and nursing note reviewed.  Constitutional:      Appearance: She is well-developed.  HENT:     Head: Normocephalic.     Nose: Congestion present.  Eyes:     General: No scleral icterus.    Conjunctiva/sclera: Conjunctivae normal.  Neck:     Thyroid: No thyromegaly.  Cardiovascular:     Rate and Rhythm: Normal rate and regular rhythm.     Heart sounds: No murmur heard.    No friction rub. No gallop.  Pulmonary:     Breath sounds: No stridor. No wheezing or rales.  Chest:     Chest wall: No tenderness.  Abdominal:     General: There is no distension.     Tenderness: There is no abdominal tenderness. There is no rebound.  Musculoskeletal:        General: Normal range of motion.     Cervical back: Neck supple.  Lymphadenopathy:     Cervical: No cervical adenopathy.  Skin:    Findings: No erythema or rash.  Neurological:     Mental Status: She is alert.     Motor: No abnormal muscle tone.     Coordination: Coordination normal.     Comments: Patient is oriented to person, she knows she is at hospital but does not remember the name of the hospital, she is unable to tell  us the date but she does know that Christmas is coming near.  She is not sure of the year but she did get her age correct at 89  Psychiatric:        Behavior: Behavior normal.     ED Results / Procedures / Treatments   Labs (all labs ordered are listed, but only abnormal results are displayed) Labs Reviewed  URINALYSIS, ROUTINE W REFLEX MICROSCOPIC - Abnormal; Notable for the following components:      Result Value   Glucose, UA >=500 (*)    Ketones, ur 5 (*)    Leukocytes,Ua MODERATE (*)    Bacteria, UA RARE (*)    All  other components within normal limits  URINE CULTURE  CBC WITH DIFFERENTIAL/PLATELET  COMPREHENSIVE METABOLIC PANEL  ETHANOL  RAPID URINE DRUG SCREEN, HOSP PERFORMED    EKG None  Radiology No results found.  Procedures Procedures  {Document cardiac monitor, telemetry assessment procedure when appropriate:1}  Medications Ordered in ED Medications - No data to display  ED Course/ Medical Decision Making/ A&P   {I spoke with the patient's aunt.  Her aunt is Natasha Chandler.  The aunt stated that a Child psychotherapist met with the patient yesterday and stated the patient should not stay by herself.  Mrs. and being states she cannot come to the hospital today because she is feeling ill but she will try to contact the patient's sister. Click here for ABCD2, HEART and other calculatorsREFRESH Note before signing :1}                              Medical Decision Making Amount and/or Complexity of Data Reviewed Labs: ordered. Radiology: ordered.   Patient with worsening confusion most likely related to worsening dementia.  She may have a urinary tract infection also.  Patient will get an MRI and social work consult.  {Document critical care time when appropriate:1} {Document review of labs and clinical decision tools ie heart score, Chads2Vasc2 etc:1}  {Document your independent review of radiology images, and any outside records:1} {Document your discussion with  family members, caretakers, and with consultants:1} {Document social determinants of health affecting pt's care:1} {Document your decision making why or why not admission, treatments were needed:1} Final Clinical Impression(s) / ED Diagnoses Final diagnoses:  None    Rx / DC Orders ED Discharge Orders     None

## 2023-11-16 NOTE — ED Provider Notes (Signed)
   ED Course / MDM   Clinical Course as of 11/16/23 2154  Fri Nov 16, 2023  1405 Received sign out, presenting with confusion. Lives alone. Neighbors called because she was confused. Has history of being confused.  [WS]  2010 Patient's MRI shows findings of dementia with innumerable microhemorrhages.  Discussed with the patient's sister.  She has reservations about the patient going home today.  She request we observe her.  Given her signs of urinary infection we will observe.  She received Macrobid earlier, however given her age we will give Cipro instead given decreased renal clearance.  She is allergic to penicillins.  She is also allergic to sulfa. [WS]  2154 Received signout pending MRI.  Patient with history of progressive dementia, had episode of behavioral disturbance today, neighbors felt she was more confused.  She lives alone.  Her MRI was negative for acute stroke.  Her labs did show UTI.  She received Macrobid.  Discussed with the patient's sister who would feel more comfortable if the patient is observed overnight.  Patient is agreeable to this.  Does seem somewhat confused when talking to her.  She has been admitted to the hospital [WS]    Clinical Course User Index [WS] Lonell Grandchild, MD   Medical Decision Making Amount and/or Complexity of Data Reviewed Labs: ordered. Radiology: ordered.  Risk Prescription drug management. Decision regarding hospitalization.          Lonell Grandchild, MD 11/16/23 2155
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
## 2023-11-16 NOTE — ED Triage Notes (Signed)
Neighbor call EMS for worst AMS. BP 155/73; HR 81, O2 97% RA; cbg 311. Per EMS, pt reported left side deficit due to previous stroke; pt received 650 mg tylenol by mouth for headache

## 2023-11-16 NOTE — Discharge Instructions (Signed)
Private Pay Resources  [facility] Address: [address], [place], [place] [address] Phone: [phone]  [facility] Address: [address], [place], [place] [address] Phone: [phone]  [facility] Address: [address], [place], [place] [address] Phone: [phone]  [facility] Address: [address], , [place] [address] Phone: [phone]  [facility] Address: [address], [place], [place] [address] Phone: [phone]  [facility] Phone: [phone]  [facility] Address:  [address], [place], [place] [address] Phone:  [phone]  [facility] Address:  [address] Phone:  [phone]  [email or web address]  [facility] [place] Phone: [phone]   

## 2023-11-16 NOTE — Progress Notes (Signed)
Transition of Care [facility]) - Emergency Department Mini Assessment   Patient Details  Name: Natasha Chandler MRN: [id number] Date of Birth: 1956-10-10  Transition of Care [facility]) CM/SW Contact:    Princella Ion, LCSW Phone Number: 11/16/2023, 3:17 PM   Clinical Narrative: CSW outreached to pt's sister, Gavin Pound [phone]) who reports they are interested in the pt receiving some assistance in the home with medications and to provide relief to herself and their aunt. Gavin Pound states she and her aunt sometimes purchases groceries and take them to the pt or either will take her grocery shopping. CSW offered referral to [facility] to outreach and discuss In home care options and ALF placement when interested. Gavin Pound agreed.  Gavin Pound interested in [facility] for medication management and SW. RNCM to assist.   CSW referred pt to [facility] with [facility] and [facility] social services and private duty care resources.    ED Mini Assessment: What brought you to the Emergency Department? : AMS  Barriers to Discharge: No Barriers Identified     Means of departure: Car       Patient Contact and Communications Key Contact 1: Gavin Pound (sister)- [phone]   Spoke with: Barnett Abu Date: 11/16/23,   Contact time: 6578 Contact Phone Number: Gavin Pound (sister)- [phone] Call outcome: Interested in [facility]    CMS Medicare.gov Compare Post Acute Care list provided to:: Patient Represenative (must comment) (Adult sister) Choice offered to / list presented to : Sibling  Admission diagnosis:  altered mental status Patient Active Problem List   Diagnosis Date Noted   CVA (cerebral vascular accident) (HCC) 05/11/2023   Dementia without behavioral disturbance (HCC) 05/11/2023   Facial droop 05/10/2023   Pain in left hip 05/23/2017   Primary osteoarthritis of left hip 05/23/2017   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 [place] [address] Phone: [phone] Fax: [phone]  [facility] - [place], [place] - [address] [place] [address] Phone: [phone] Fax: [phone]

## 2023-11-16 NOTE — Care Management (Addendum)
Transition of Care [facility]) - Emergency Department Mini Assessment   Patient Details  Name: Natasha Chandler MRN: [id number] Date of Birth: 11-09-1956  Transition [facility]) CM/SW Contact:    Lavenia Atlas, RN Phone Number: 11/16/2023, 3:35 PM   Clinical Narrative: Received TOC consult for [facility] services. Per chart review patient currently resides at home alone and currently in [facility] ED with AMS. This RNCM spoke with patient's sister Gavin Pound [phone] to offer choice. Family will accept any HH agency that will accept patient's insurance. Gavin Pound reports she is already working with DSS SW Mrs.Arias. [facility] ED SW has spoken with Gavin Pound earlier today and will provide additional resources.  This RNCM notified Cindie with [facility] who will follow patient for [facility], HHPT/OT, SW services. Will add to AVS. Notified EDP for HHRN(medication management), HHPT/OT, SW orders. HH orders are in.   No additional TOC needs at this time.   ED Mini Assessment: What brought you to the Emergency Department? : AMS  Barriers to Discharge: No Barriers Identified     Means of departure: Car  Interventions which prevented an admission or readmission: Home Health Consult or Services    Patient Contact and Communications Key Contact 1: Gavin Pound (sister)- [phone]   Spoke with: Barnett Abu Date: 11/16/23,   Contact time: 9528 Contact Phone Number: Gavin Pound (sister)- [phone] Call outcome: Interested in [facility]    CMS Medicare.gov Compare Post Acute Care list provided to:: Patient Represenative (must comment) (Adult sister) Choice offered to / list presented to : Sibling  Admission diagnosis:  altered mental status Patient Active Problem List   Diagnosis Date Noted   CVA (cerebral vascular accident) (HCC) 05/11/2023   Dementia without behavioral disturbance (HCC) 05/11/2023   Facial droop 05/10/2023   Pain in left hip 05/23/2017   Primary osteoarthritis of left hip 05/23/2017   Chronic vasomotor  rhinitis 01/30/2017   Epistaxis 01/30/2017   Perennial allergic rhinitis 01/30/2017   Uncontrolled type 2 diabetes mellitus with hyperglycemia, with long-term current use of insulin (HCC) 09/28/2016   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 [place] [address] Phone: [phone] Fax: [phone]  [facility] - [place], [place] - [address] [place] [address] Phone: [phone] Fax: [phone]
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
## 2023-11-16 NOTE — Hospital Course (Addendum)
The patient is a 29 old obese Caucasian female, reportedly lives alone in an apartment complex, states that her sister lives nearby and intermittently comes stays with her, medical history significant for poorly controlled type II DM/IDDM, HTN, HLD, acute ischemic stroke 05/2023 with reported left-sided deficits, hypothyroidism, asthma, early dementia, vitamin D deficiency, brought in by EMS to ED on 11/16/2023 due to concern for altered mental status. As per EDP note, patient presented to the ED with confusion and neighbors called 911 because she was confused but also has history of being confused. She was reportedly knocking on neighbors door.  Patient has had aggressive behavior.  Upon admission AAO X4.  Concerns of possible acute cystitis started on Macrobid which was later discontinued.  Due to aggressive behavior, psychiatry was consulted.  She continued to have significant paranoid ideations.  Overall patient is medically doing better but clearly due to behavioral issues, she needs to be admitted inpatient psych and current bed availability still pending..  Assessment & Plan:  Principal Problem:   UTI (urinary tract infection) Active Problems:   Delusional disorder (HCC)   MDD (major depressive disorder), single episode, severe with psychotic features (HCC)   Homicidal ideations   Dementia with behavioral abnormalities -Worsening dementia with erratic behavior and concerns of safety.   -Patient had lack of medical decision making capacity and is involuntary committed.   -Psychiatry team has been consulted.   -Metabolic workup thus far is negative.   -MRI brain shows chronic changes.   -Continuing on Risperidone 0.25 mg nightly as well as Donepezil 5 mg p.o. nightly -Had MOCA testing yesterday and did poorly -Psychiatry consulted and recommending one-to-one observation and recommending inpatient geropsych given that she has an increase in confabulation and perseveration; she continues to  have paranoid delusions and psychiatry renewed her IVC and the current plan is to transfer to inpatient psychiatric facility once bed is available  Acute cystitis ruled out. Dysuria due to undetermined cause -Initially thought to be concerning infection and received Macrobid.   -Eventually insignificant colony/growth therefore antibiotics discontinued.   T2DM, poorly controlled -Currently on long-acting regimen with Semglee 13 units subcu nightly with moderate NovoLog/scale insulin before meals and at bedtime and insulin aspart has been increased to 10 units 3 times daily with meals -Will further adjust as necessary. -CBG trend: Recent Labs  Lab 11/21/23 2047 11/22/23 0711 11/22/23 1144 11/22/23 1634 11/22/23 2024 11/23/23 0740 11/23/23 1154  GLUCAP 216* 165* 332* 135* 252* 190* 251*    Essential HTN -C/w Home Amlodipine 10 mg po Daily and losartan 100 mg p.o. daily -Continue to monitor blood pressures per protocol -Last blood pressure reading was on the softer side at 114/60   HLD -Continue Rosuvastatin 20 mg po Daily    History of CVA -Chronic.  Rosuvastatin 20 mg po Daily    Depression and anxiety -Continue Sertraline 50 mg po Daily and Risperidone 0.25 mg p.o. nightly   Asthma -C/w Bronchodilators as needed with Albuterol 3 mL IH q6hprn Wheezing and SOB -Also continue Dulera 2 puffs IH twice daily   Class I Obesity -Complicates overall prognosis and care -Estimated body mass index is 31.85 kg/m as calculated from the following:   Height as of this encounter: 5\' 2"  (1.575 m).   Weight as of this encounter: 79 kg.  -Weight Loss and Dietary Counseling given   Medically cleared to be transferred to inpatient psych.  Her psychiatry treatment can continue at the facility.

## 2023-11-17 DIAGNOSIS — N3000 Acute cystitis without hematuria: Secondary | ICD-10-CM | POA: Diagnosis not present

## 2023-11-17 LAB — HEMOGLOBIN A1C
Hgb A1c MFr Bld: 9.4 % — ABNORMAL HIGH (ref 4.8–5.6)
Mean Plasma Glucose: 223.08 mg/dL

## 2023-11-17 LAB — GLUCOSE, CAPILLARY
Glucose-Capillary: 249 mg/dL — ABNORMAL HIGH (ref 70–99)
Glucose-Capillary: 193 mg/dL — ABNORMAL HIGH (ref 70–99)
Glucose-Capillary: 334 mg/dL — ABNORMAL HIGH (ref 70–99)
Glucose-Capillary: 124 mg/dL — ABNORMAL HIGH (ref 70–99)

## 2023-11-17 LAB — URINE CULTURE: Culture: <10000 — AB

## 2023-11-17 MED ORDER — NITROFURANTOIN MONOHYD MACRO 100 MG PO CAPS: 100.0000 mg | ORAL_CAPSULE | Freq: Two times a day (BID) | ORAL | 0 refills | Status: DC

## 2023-11-17 MED ORDER — NITROFURANTOIN MONOHYD MACRO 100 MG PO CAPS
100.0000 mg | ORAL_CAPSULE | Freq: Two times a day (BID) | ORAL | Status: DC
  Administered 2023-11-17 – 2023-11-18 (×3): 100 mg via ORAL
  Filled 2023-11-17 (×3): qty 1
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
## 2023-11-17 NOTE — TOC Progression Note (Addendum)
Transition of Care [facility]) - Progression Note    Patient Details  Name: Natasha Chandler MRN: [id number] Date of Birth: 08/09/1956  Transition of Care [facility]) CM/SW Contact  Adrian Prows, RN Phone Number: 11/17/2023, 6:30 PM  Clinical Narrative:    Pt confused; attempted to contact pt's sister Shayona Lavoie ([phone]) to complete MOON; LVM; awaiting return call.  -[phone]- pt's sister returned call; MOON was explained to pt's sister Emanda Soo; she verbalized understanding; MOON completed; she was notified copy of document placed on pt's shadow chart.    Barriers to Discharge: No Barriers Identified  Expected Discharge Plan and Services         Expected Discharge Date: 11/17/23                                     Social Determinants of Health (SDOH) Interventions SDOH Screenings   Food Insecurity: No Food Insecurity (11/16/2023)  Housing: Low Risk  (11/16/2023)  Transportation Needs: No Transportation Needs (11/16/2023)  Utilities: Not At Risk (11/16/2023)  Alcohol Screen: Low Risk  (08/13/2020)  Depression (PHQ2-9): Medium Risk (08/13/2020)  Financial Resource Strain: Medium Risk (08/13/2020)  Physical Activity: Insufficiently Active (08/13/2020)  Social Connections: Unknown (08/13/2020)  Stress: Stress Concern Present (08/13/2020)  Tobacco Use: Low Risk  (11/16/2023)    Readmission Risk Interventions     No data to display
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
## 2023-11-17 NOTE — Care Management Obs Status (Signed)
MEDICARE OBSERVATION STATUS NOTIFICATION   Patient Details  Name: Natasha Chandler MRN: [id number] Date of Birth: November 13, 1956   Medicare Observation Status Notification Given:  Yes    Adrian Prows, RN 11/17/2023, 6:40 PM

## 2023-11-17 NOTE — Plan of Care (Signed)
  Problem: Coping: Goal: Ability to adjust to condition or change in health will improve Outcome: Progressing   Problem: Fluid Volume: Goal: Ability to maintain a balanced intake and output will improve Outcome: Progressing   Problem: Health Behavior/Discharge Planning: Goal: Ability to identify and utilize available resources and services will improve Outcome: Progressing Goal: Ability to manage health-related needs will improve Outcome: Progressing   
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
## 2023-11-17 NOTE — TOC Progression Note (Addendum)
Transition of Care [facility]) - Progression Note    Patient Details  Name: Natasha Chandler MRN: [id number] Date of Birth: 04/05/1956  Transition of Care [facility]) CM/SW Contact  Adrian Prows, RN Phone Number: 11/17/2023, 5:28 PM  Clinical Narrative:    Notified pt confused and unsafe for d/c home; spoke w/ Dr Waymon Amato; previous note dated 10/29/23 from [facility] discusses concern for dementia; he says if pt tries to leave AMA, will have to consider IVC; psych has been consulted  -1730- notified pt attempting to leave AMA; IVC paperwork completed by Dr Waymon Amato; Cheri Rous, [facility] Supervisor notified; Cory at [facility] notified pt's d/c cancelled.  -71- Petition and Affidavit and First Exam e-filed per Deatra Robinson, NS in ED; awaiting return documents; [employer] on site at [facility] and will serve patient; Clydie Braun confirmed w/ [facility] that documents have been received     Barriers to Discharge: No Barriers Identified  Expected Discharge Plan and Services         Expected Discharge Date: 11/17/23                                     Social Determinants of Health (SDOH) Interventions SDOH Screenings   Food Insecurity: No Food Insecurity (11/16/2023)  Housing: Low Risk  (11/16/2023)  Transportation Needs: No Transportation Needs (11/16/2023)  Utilities: Not At Risk (11/16/2023)  Alcohol Screen: Low Risk  (08/13/2020)  Depression (PHQ2-9): Medium Risk (08/13/2020)  Financial Resource Strain: Medium Risk (08/13/2020)  Physical Activity: Insufficiently Active (08/13/2020)  Social Connections: Unknown (08/13/2020)  Stress: Stress Concern Present (08/13/2020)  Tobacco Use: Low Risk  (11/16/2023)    Readmission Risk Interventions     No data to display
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
## 2023-11-17 NOTE — Progress Notes (Signed)
Mobility Specialist - Progress Note   11/17/23 1003  Mobility  Activity Ambulated with assistance in hallway  Level of Assistance Modified independent, requires aide device or extra time  Assistive Device None  Distance Ambulated (ft) 800 ft  Range of Motion/Exercises Active  Activity Response Tolerated well  Mobility Referral Yes  Mobility visit 1 Mobility  Mobility Specialist Start Time (ACUTE ONLY) [id number]  Mobility Specialist Stop Time (ACUTE ONLY) 0940  Mobility Specialist Time Calculation (min) (ACUTE ONLY) 11 min   Received in bed and agreed to mobility. Had no issues throughout session. Returned to bed with all needs met.  Marilynne Halsted Mobility Specialist

## 2023-11-17 NOTE — Plan of Care (Signed)

## 2023-11-18 DIAGNOSIS — F03918 Unspecified dementia, unspecified severity, with other behavioral disturbance: Secondary | ICD-10-CM | POA: Diagnosis not present

## 2023-11-18 LAB — GLUCOSE, CAPILLARY
Glucose-Capillary: 172 mg/dL — ABNORMAL HIGH (ref 70–99)
Glucose-Capillary: 231 mg/dL — ABNORMAL HIGH (ref 70–99)
Glucose-Capillary: 279 mg/dL — ABNORMAL HIGH (ref 70–99)
Glucose-Capillary: 272 mg/dL — ABNORMAL HIGH (ref 70–99)

## 2023-11-18 LAB — VITAMIN B12: Vitamin B-12: 537 pg/mL (ref 180–914)

## 2023-11-18 LAB — VITAMIN D 25 HYDROXY (VIT D DEFICIENCY, FRACTURES): Vit D, 25-Hydroxy: 64.53 ng/mL (ref 30–100)

## 2023-11-18 LAB — TSH: TSH: 2.385 u[IU]/mL (ref 0.350–4.500)
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
## 2023-11-18 NOTE — Plan of Care (Signed)
  Problem: Education: Goal: Ability to describe self-care measures that may prevent or decrease complications (Diabetes Survival Skills Education) will improve Outcome: Progressing   Problem: Fluid Volume: Goal: Ability to maintain a balanced intake and output will improve Outcome: Progressing   Problem: Education: Goal: Knowledge of General Education information will improve Description: Including pain rating scale, medication(s)/side effects and non-pharmacologic comfort measures Outcome: Progressing   Problem: Activity: Goal: Risk for activity intolerance will decrease Outcome: Progressing   Problem: Nutrition: Goal: Adequate nutrition will be maintained Outcome: Progressing   Problem: Coping: Goal: Level of anxiety will decrease Outcome: Progressing   Problem: Safety: Goal: Ability to remain free from injury will improve Outcome: Progressing   Problem: Skin Integrity: Goal: Risk for impaired skin integrity will decrease Outcome: Progressing

## 2023-11-19 DIAGNOSIS — F22 Delusional disorders: Secondary | ICD-10-CM | POA: Diagnosis not present

## 2023-11-19 DIAGNOSIS — N3000 Acute cystitis without hematuria: Secondary | ICD-10-CM | POA: Diagnosis not present

## 2023-11-19 LAB — GLUCOSE, CAPILLARY
Glucose-Capillary: 211 mg/dL — ABNORMAL HIGH (ref 70–99)
Glucose-Capillary: 256 mg/dL — ABNORMAL HIGH (ref 70–99)
Glucose-Capillary: 184 mg/dL — ABNORMAL HIGH (ref 70–99)
Glucose-Capillary: 308 mg/dL — ABNORMAL HIGH (ref 70–99)

## 2023-11-19 LAB — RPR: RPR Ser Ql: NONREACTIVE

## 2023-11-19 MED ORDER — METOPROLOL TARTRATE 5 MG/5ML IV SOLN: 5.0000 mg | INTRAVENOUS | Status: DC | PRN

## 2023-11-19 MED ORDER — HYDRALAZINE HCL 20 MG/ML IJ SOLN: 10.0000 mg | INTRAMUSCULAR | Status: DC | PRN

## 2023-11-19 MED ORDER — CARMEX CLASSIC LIP BALM EX OINT: TOPICAL_OINTMENT | CUTANEOUS | Status: DC | PRN

## 2023-11-19 NOTE — Progress Notes (Signed)
Mobility Specialist - Progress Note   11/19/23 0955  Mobility  Activity Ambulated with assistance in hallway  Level of Assistance Modified independent, requires aide device or extra time  Assistive Device None  Distance Ambulated (ft) 1000 ft  Range of Motion/Exercises Active  Activity Response Tolerated well  Mobility Referral Yes  Mobility visit 1 Mobility  Mobility Specialist Start Time (ACUTE ONLY) [id number]  Mobility Specialist Stop Time (ACUTE ONLY) 0948  Mobility Specialist Time Calculation (min) (ACUTE ONLY) 9 min   Received in bed and agreed to mobility. Had no issues throughout session. Returned to bed with all needs met and RN in room.  Marilynne Halsted Mobility Specialist

## 2023-11-19 NOTE — TOC Progression Note (Signed)
Transition of Care [facility]) - Progression Note    Patient Details  Name: Natasha Chandler MRN: [id number] Date of Birth: [birth year]  Transition of [facility]) CM/SW Contact  Otelia Santee, LCSW Phone Number: 11/19/2023, 2:58 PM  Clinical Narrative:    Pt recommended for inpatient geriatric psych placement. Referrals have been sent out and currently awaiting for bed availability/acceptance.      Barriers to Discharge: No Barriers Identified  Expected Discharge Plan and Services         Expected Discharge Date: 11/17/23                                     Social Determinants of Health (SDOH) Interventions SDOH Screenings   Food Insecurity: No Food Insecurity (11/16/2023)  Housing: Low Risk  (11/16/2023)  Transportation Needs: No Transportation Needs (11/16/2023)  Utilities: Not At Risk (11/16/2023)  Alcohol Screen: Low Risk  (08/13/2020)  Depression (PHQ2-9): Medium Risk (08/13/2020)  Financial Resource Strain: Medium Risk (08/13/2020)  Physical Activity: Insufficiently Active (08/13/2020)  Social Connections: Unknown (08/13/2020)  Stress: Stress Concern Present (08/13/2020)  Tobacco Use: Low Risk  (11/16/2023)    Readmission Risk Interventions     No data to display
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
## 2023-11-19 NOTE — Progress Notes (Deleted)
PROGRESS NOTE    Natasha Chandler  ZOX:[id number] DOB: Jun 03, 1956 DOA: 11/16/2023 PCP: Judd Lien, PA-C    Brief Narrative:  53 old female, reportedly lives alone in an apartment complex, states that her sister lives nearby and intermittently comes stays with her, medical history significant for poorly controlled type II DM/IDDM, HTN, HLD, acute ischemic stroke 05/2023 with reported left-sided deficits, hypothyroidism, asthma, early dementia, vitamin D deficiency, brought in by EMS to ED on 11/16/2023 due to concern for altered mental status. As per EDP note, patient presented to the ED with confusion and neighbors called 911 because she was confused but also has history of being confused. She was reportedly knocking on neighbors door.  Patient has had aggressive behavior.  Upon admission AAO X4.  Concerns of possible acute cystitis started on Macrobid which was later discontinued.  Due to aggressive behavior, psychiatry was consulted.   Assessment & Plan:  Principal Problem:   UTI (urinary tract infection)   Dementia with behavioral abnormalities Worsening dementia with erratic behavior and concerns of safety.  Patient clearly has lack of medical decision making capacity and is involuntary committed.  Psychiatry team has been consulted.  Metabolic workup thus far is negative.  MRI brain shows chronic changes.  Currently on Aricept.  Acute cystitis ruled out. Dysuria due to undetermined cause Initially thought to be concerning infection and received Macrobid.  Eventually insignificant colony/growth therefore antibiotics discontinued.   T2DM, poorly controlled Currently on long-acting regimen with sliding scale and Accu-Chek.  Will further adjust as necessary.   HTN Norvasc, losartan.  IV as needed   HLD -Continue rosuvastatin    History of CVA Chronic.  Continue statin   Depression -Continue sertraline   Asthma Broncho dilators as needed   Body mass index is 31.85 kg/m.      DVT prophylaxis: enoxaparin (LOVENOX) injection 40 mg Start: 11/16/23 2200 Code Status: Full code Family Communication:   On going safety and psych eval.  Medically stable at this time   Subjective: Ptn is answering basic questions. No other complaints.    Examination:  General exam: Appears calm and comfortable  Respiratory system: Clear to auscultation. Respiratory effort normal. Cardiovascular system: S1 & S2 heard, RRR. No JVD, murmurs, rubs, gallops or clicks. No pedal edema. Gastrointestinal system: Abdomen is nondistended, soft and nontender. No organomegaly or masses felt. Normal bowel sounds heard. Central nervous system: Alert and oriented. No focal neurological deficits. Extremities: Symmetric 4 x 5 power. Skin: No rashes, lesions or ulcers Psychiatry: Judgement and insight appear poor                Diet Orders (From admission, onward)     Start     Ordered   11/17/23 0000  Diet - low sodium heart healthy        11/17/23 1046   11/17/23 0000  Diet Carb Modified        11/17/23 1046   11/16/23 2102  Diet Carb Modified Fluid consistency: Thin; Room service appropriate? Yes  Diet effective now       Question Answer Comment  Diet-HS Snack? Nothing   Calorie Level Medium 1600-2000   Fluid consistency: Thin   Room service appropriate? Yes      11/16/23 2101            Objective: Vitals:   11/18/23 2111 11/19/23 0428 11/19/23 0916 11/19/23 1252  BP:  (!) 133/53  133/72  Pulse:  (!) 54  66  Resp:  18  17  Temp:  98.2 F (36.8 C)  98.2 F (36.8 C)  TempSrc:  Oral    SpO2: 99% 99% 96% 100%  Weight:      Height:        Intake/Output Summary (Last 24 hours) at 11/19/2023 1419 Last data filed at 11/19/2023 1254 Gross per 24 hour  Intake 476 ml  Output --  Net 476 ml   Filed Weights   11/16/23 2314  Weight: 79 kg    Scheduled Meds:  amLODipine  10 mg Oral Daily   donepezil  5 mg Oral QHS   enoxaparin (LOVENOX) injection  40 mg  Subcutaneous Q24H   insulin aspart  0-15 Units Subcutaneous TID WC   insulin aspart  0-5 Units Subcutaneous QHS   insulin aspart  3 Units Subcutaneous TID WC   insulin glargine-yfgn  10 Units Subcutaneous QHS   losartan  100 mg Oral Daily   mometasone-formoterol  2 puff Inhalation BID   rosuvastatin  20 mg Oral Daily   sertraline  50 mg Oral Daily   Continuous Infusions:  Nutritional status     Body mass index is 31.85 kg/m.  Data Reviewed:   CBC: Recent Labs  Lab 11/16/23 1235  WBC 7.7  NEUTROABS 5.5  HGB 14.6  HCT 42.7  MCV 85.9  PLT 229   Basic Metabolic Panel: Recent Labs  Lab 11/16/23 1235  NA 134*  K 3.7  CL 102  CO2 24  GLUCOSE 339*  BUN 17  CREATININE 0.67  CALCIUM 9.5   GFR: Estimated Creatinine Clearance: 66.5 mL/min (by C-G formula based on SCr of 0.67 mg/dL). Liver Function Tests: Recent Labs  Lab 11/16/23 1235  AST 27  ALT 25  ALKPHOS 77  BILITOT 1.0  PROT 7.2  ALBUMIN 4.2   No results for input(s): "LIPASE", "AMYLASE" in the last 168 hours. No results for input(s): "AMMONIA" in the last 168 hours. Coagulation Profile: No results for input(s): "INR", "PROTIME" in the last 168 hours. Cardiac Enzymes: No results for input(s): "CKTOTAL", "CKMB", "CKMBINDEX", "TROPONINI" in the last 168 hours. BNP (last 3 results) No results for input(s): "PROBNP" in the last 8760 hours. HbA1C: Recent Labs    11/17/23 0619  HGBA1C 9.4*   CBG: Recent Labs  Lab 11/18/23 1143 11/18/23 1656 11/18/23 2118 11/19/23 0828 11/19/23 1212  GLUCAP 231* 279* 272* 211* 256*   Lipid Profile: No results for input(s): "CHOL", "HDL", "LDLCALC", "TRIG", "CHOLHDL", "LDLDIRECT" in the last 72 hours. Thyroid Function Tests: Recent Labs    11/18/23 1233  TSH 2.385   Anemia Panel: Recent Labs    11/18/23 1233  VITAMINB12 537   Sepsis Labs: No results for input(s): "PROCALCITON", "LATICACIDVEN" in the last 168 hours.  Recent Results (from the past 240  hours)  Urine Culture     Status: Abnormal   Collection Time: 11/16/23 12:50 PM   Specimen: Urine, Clean Catch  Result Value Ref Range Status   Specimen Description   Final    URINE, CLEAN CATCH Performed at [facility], [address], [place], [place] [address]    Special Requests   Final    NONE Performed at [facility], [address] [place], [place] [address]    Culture (A)  Final    <10,000 COLONIES/mL INSIGNIFICANT GROWTH Performed at [facility], [address] [place], [place] [address]    Report Status 11/17/2023 FINAL  Final  Radiology Studies: No results found.         LOS: 0 days   Time spent= 35 mins    Miguel Rota, MD [facility]  If 7PM-7AM, please contact night-coverage  11/19/2023, 2:19 PM

## 2023-11-19 NOTE — Progress Notes (Signed)
PROGRESS NOTE    Natasha Chandler  UUV:[id number] DOB: 1956-07-29 DOA: 11/16/2023 PCP: Judd Lien, PA-C    Brief Narrative:  7 old female, reportedly lives alone in an apartment complex, states that her sister lives nearby and intermittently comes stays with her, medical history significant for poorly controlled type II DM/IDDM, HTN, HLD, acute ischemic stroke 05/2023 with reported left-sided deficits, hypothyroidism, asthma, early dementia, vitamin D deficiency, brought in by EMS to ED on 11/16/2023 due to concern for altered mental status. As per EDP note, patient presented to the ED with confusion and neighbors called 911 because she was confused but also has history of being confused. She was reportedly knocking on neighbors door.  Patient has had aggressive behavior.  Upon admission AAO X4.  Concerns of possible acute cystitis started on Macrobid which was later discontinued.  Due to aggressive behavior, psychiatry was consulted.   Assessment & Plan:  Principal Problem:   UTI (urinary tract infection)   Dementia with behavioral abnormalities Worsening dementia with erratic behavior and concerns of safety.  Patient clearly has lack of medical decision making capacity and is involuntary committed.  Psychiatry team has been consulted.  Metabolic workup thus far is negative.  MRI brain shows chronic changes.  Currently on Aricept.  Acute cystitis ruled out. Dysuria due to undetermined cause Initially thought to be concerning infection and received Macrobid.  Eventually insignificant colony/growth therefore antibiotics discontinued.   T2DM, poorly controlled Currently on long-acting regimen with sliding scale and Accu-Chek.  Will further adjust as necessary.   HTN Norvasc, losartan.  IV as needed   HLD -Continue rosuvastatin    History of CVA Chronic.  Continue statin   Depression -Continue sertraline   Asthma Broncho dilators as needed   Body mass index is 31.85 kg/m.      DVT prophylaxis: enoxaparin (LOVENOX) injection 40 mg Start: 11/16/23 2200 Code Status: Full code Family Communication:   On going safety and psych eval.  Medically stable at this time   Subjective: Ptn is answering basic questions. No other complaints.    Examination:  General exam: Appears calm and comfortable  Respiratory system: Clear to auscultation. Respiratory effort normal. Cardiovascular system: S1 & S2 heard, RRR. No JVD, murmurs, rubs, gallops or clicks. No pedal edema. Gastrointestinal system: Abdomen is nondistended, soft and nontender. No organomegaly or masses felt. Normal bowel sounds heard. Central nervous system: Alert and oriented. No focal neurological deficits. Extremities: Symmetric 5 x 5 power. Skin: No rashes, lesions or ulcers Psychiatry: Judgement and insight appear poor                Diet Orders (From admission, onward)     Start     Ordered   11/17/23 0000  Diet - low sodium heart healthy        11/17/23 1046   11/17/23 0000  Diet Carb Modified        11/17/23 1046   11/16/23 2102  Diet Carb Modified Fluid consistency: Thin; Room service appropriate? Yes  Diet effective now       Question Answer Comment  Diet-HS Snack? Nothing   Calorie Level Medium 1600-2000   Fluid consistency: Thin   Room service appropriate? Yes      11/16/23 2101            Objective: Vitals:   11/18/23 2111 11/19/23 0428 11/19/23 0916 11/19/23 1252  BP:  (!) 133/53  133/72  Pulse:  (!) 54  66  Resp:  18  17  Temp:  98.2 F (36.8 C)  98.2 F (36.8 C)  TempSrc:  Oral    SpO2: 99% 99% 96% 100%  Weight:      Height:        Intake/Output Summary (Last 24 hours) at 11/19/2023 1415 Last data filed at 11/19/2023 1254 Gross per 24 hour  Intake 476 ml  Output --  Net 476 ml   Filed Weights   11/16/23 2314  Weight: 79 kg    Scheduled Meds:  amLODipine  10 mg Oral Daily   donepezil  5 mg Oral QHS   enoxaparin (LOVENOX) injection  40 mg  Subcutaneous Q24H   insulin aspart  0-15 Units Subcutaneous TID WC   insulin aspart  0-5 Units Subcutaneous QHS   insulin aspart  3 Units Subcutaneous TID WC   insulin glargine-yfgn  10 Units Subcutaneous QHS   losartan  100 mg Oral Daily   mometasone-formoterol  2 puff Inhalation BID   rosuvastatin  20 mg Oral Daily   sertraline  50 mg Oral Daily   Continuous Infusions:  Nutritional status     Body mass index is 31.85 kg/m.  Data Reviewed:   CBC: Recent Labs  Lab 11/16/23 1235  WBC 7.7  NEUTROABS 5.5  HGB 14.6  HCT 42.7  MCV 85.9  PLT 229   Basic Metabolic Panel: Recent Labs  Lab 11/16/23 1235  NA 134*  K 3.7  CL 102  CO2 24  GLUCOSE 339*  BUN 17  CREATININE 0.67  CALCIUM 9.5   GFR: Estimated Creatinine Clearance: 66.5 mL/min (by C-G formula based on SCr of 0.67 mg/dL). Liver Function Tests: Recent Labs  Lab 11/16/23 1235  AST 27  ALT 25  ALKPHOS 77  BILITOT 1.0  PROT 7.2  ALBUMIN 4.2   No results for input(s): "LIPASE", "AMYLASE" in the last 168 hours. No results for input(s): "AMMONIA" in the last 168 hours. Coagulation Profile: No results for input(s): "INR", "PROTIME" in the last 168 hours. Cardiac Enzymes: No results for input(s): "CKTOTAL", "CKMB", "CKMBINDEX", "TROPONINI" in the last 168 hours. BNP (last 3 results) No results for input(s): "PROBNP" in the last 8760 hours. HbA1C: Recent Labs    11/17/23 0619  HGBA1C 9.4*   CBG: Recent Labs  Lab 11/18/23 1143 11/18/23 1656 11/18/23 2118 11/19/23 0828 11/19/23 1212  GLUCAP 231* 279* 272* 211* 256*   Lipid Profile: No results for input(s): "CHOL", "HDL", "LDLCALC", "TRIG", "CHOLHDL", "LDLDIRECT" in the last 72 hours. Thyroid Function Tests: Recent Labs    11/18/23 1233  TSH 2.385   Anemia Panel: Recent Labs    11/18/23 1233  VITAMINB12 537   Sepsis Labs: No results for input(s): "PROCALCITON", "LATICACIDVEN" in the last 168 hours.  Recent Results (from the past 240  hours)  Urine Culture     Status: Abnormal   Collection Time: 11/16/23 12:50 PM   Specimen: Urine, Clean Catch  Result Value Ref Range Status   Specimen Description   Final    URINE, CLEAN CATCH Performed at [facility], [address], [place], [place] [address]    Special Requests   Final    NONE Performed at [facility], [address] [place], [place] [address]    Culture (A)  Final    <10,000 COLONIES/mL INSIGNIFICANT GROWTH Performed at [facility], [address], [place], [place] [address]    Report Status 11/17/2023 FINAL  Final  Radiology Studies: No results found.         LOS: 0 days   Time spent= 35 mins    Miguel Rota, MD [facility]  If 7PM-7AM, please contact night-coverage  11/19/2023, 2:15 PM

## 2023-11-19 NOTE — Inpatient Diabetes Management (Signed)
Inpatient Diabetes Program Recommendations  AACE/ADA: New Consensus Statement on Inpatient Glycemic Control (2015)  Target Ranges:  Prepandial:   less than 140 mg/dL      Peak postprandial:   less than 180 mg/dL (1-2 hours)      Critically ill patients:  140 - 180 mg/dL   Lab Results  Component Value Date   GLUCAP 211 (H) 11/19/2023   HGBA1C 9.4 (H) 11/17/2023    Review of Glycemic Control  Latest Reference Range & Units 11/18/23 07:37 11/18/23 11:43 11/18/23 16:56 11/18/23 21:18 11/19/23 08:28  Glucose-Capillary 70 - 99 mg/dL 034 (H) 742 (H) 595 (H) 272 (H) 211 (H)  (H): Data is abnormally high  Diabetes history: DM2 Outpatient Diabetes medications: Tresiba 10 every day, Actos 45 mg every day, Ozempic 2 mg weekly Current orders for Inpatient glycemic control: Semglee 10 units every day, Novolog 0-15 units TID and 0-5 units QHS  Inpatient Diabetes Program Recommendations:    Please consider increasing meal coverage:  Novolog 5 units TID with meals if she consumes at least 50%.  Will continue to follow while inpatient.  Thank you, Dulce Sellar, MSN, CDCES Diabetes Coordinator Inpatient Diabetes Program [phone] (team pager from 8a-5p)

## 2023-11-20 DIAGNOSIS — F03918 Unspecified dementia, unspecified severity, with other behavioral disturbance: Secondary | ICD-10-CM | POA: Diagnosis not present

## 2023-11-20 DIAGNOSIS — N3000 Acute cystitis without hematuria: Secondary | ICD-10-CM | POA: Diagnosis not present

## 2023-11-20 LAB — GLUCOSE, CAPILLARY
Glucose-Capillary: 126 mg/dL — ABNORMAL HIGH (ref 70–99)
Glucose-Capillary: 226 mg/dL — ABNORMAL HIGH (ref 70–99)
Glucose-Capillary: 289 mg/dL — ABNORMAL HIGH (ref 70–99)
Glucose-Capillary: 221 mg/dL — ABNORMAL HIGH (ref 70–99)

## 2023-11-20 LAB — BASIC METABOLIC PANEL
Anion gap: 7 (ref 5–15)
BUN: 18 mg/dL (ref 8–23)
CO2: 26 mmol/L (ref 22–32)
Calcium: 9.4 mg/dL (ref 8.9–10.3)
Chloride: 106 mmol/L (ref 98–111)
Creatinine, Ser: 0.56 mg/dL (ref 0.44–1.00)
GFR, Estimated: >60 mL/min (ref >60)
Glucose, Bld: 129 mg/dL — ABNORMAL HIGH (ref 70–99)
Potassium: 3.7 mmol/L (ref 3.5–5.1)
Sodium: 139 mmol/L (ref 135–145)

## 2023-11-20 LAB — MAGNESIUM: Magnesium: 2.2 mg/dL (ref 1.7–2.4)

## 2023-11-20 LAB — CBC
HCT: 41.7 % (ref 36.0–46.0)
Hemoglobin: 13.9 g/dL (ref 12.0–15.0)
MCH: 29.1 pg (ref 26.0–34.0)
MCHC: 33.3 g/dL (ref 30.0–36.0)
MCV: 87.2 fL (ref 80.0–100.0)
Platelets: 226 10*3/uL (ref 150–400)
RBC: 4.78 MIL/uL (ref 3.87–5.11)
RDW: 13.1 % (ref 11.5–15.5)
WBC: 8.9 10*3/uL (ref 4.0–10.5)
nRBC: 0 % (ref 0.0–0.2)

## 2023-11-20 MED ORDER — RISPERIDONE 0.5 MG PO TBDP
0.2500 mg | ORAL_TABLET | Freq: Every day | ORAL | Status: DC
  Administered 2023-11-20 – 2023-11-21 (×2): 0.25 mg via ORAL
  Filled 2023-11-20 (×2): qty 0.5

## 2023-11-20 MED ORDER — INSULIN ASPART 100 UNIT/ML IJ SOLN
5.0000 [IU] | Freq: Three times a day (TID) | INTRAMUSCULAR | Status: DC
  Administered 2023-11-20 – 2023-11-21 (×3): 5 [IU] via SUBCUTANEOUS

## 2023-11-20 MED ORDER — POTASSIUM CHLORIDE CRYS ER 20 MEQ PO TBCR
20.0000 meq | EXTENDED_RELEASE_TABLET | Freq: Once | ORAL | Status: AC
  Administered 2023-11-20: 20 meq via ORAL
  Filled 2023-11-20: qty 1

## 2023-11-20 NOTE — Progress Notes (Signed)
PROGRESS NOTE    Natasha Chandler  ZOX:[id number] DOB: Aug 25, 1956 DOA: 11/16/2023 PCP: Judd Lien, PA-C    Brief Narrative:  50 old female, reportedly lives alone in an apartment complex, states that her sister lives nearby and intermittently comes stays with her, medical history significant for poorly controlled type II DM/IDDM, HTN, HLD, acute ischemic stroke 05/2023 with reported left-sided deficits, hypothyroidism, asthma, early dementia, vitamin D deficiency, brought in by EMS to ED on 11/16/2023 due to concern for altered mental status. As per EDP note, patient presented to the ED with confusion and neighbors called 911 because she was confused but also has history of being confused. She was reportedly knocking on neighbors door.  Patient has had aggressive behavior.  Upon admission AAO X4.  Concerns of possible acute cystitis started on Macrobid which was later discontinued.  Due to aggressive behavior, psychiatry was consulted. Overall patient is medically doing better but clearly due to behavioral issues, she needs to be admitted inpatient psych.  Assessment & Plan:  Principal Problem:   UTI (urinary tract infection)   Dementia with behavioral abnormalities Worsening dementia with erratic behavior and concerns of safety.  Patient clearly has lack of medical decision making capacity and is involuntary committed.  Psychiatry team has been consulted.  Metabolic workup thus far is negative.  MRI brain shows chronic changes.  Currently on Aricept.  Acute cystitis ruled out. Dysuria due to undetermined cause Initially thought to be concerning infection and received Macrobid.  Eventually insignificant colony/growth therefore antibiotics discontinued.   T2DM, poorly controlled Currently on long-acting regimen with sliding scale and Accu-Chek.  Will further adjust as necessary.   HTN Norvasc, losartan.  IV as needed   HLD -Continue rosuvastatin    History of CVA Chronic.  Continue  statin   Depression -Continue sertraline   Asthma Broncho dilators as needed   Body mass index is 31.85 kg/m.     DVT prophylaxis: enoxaparin (LOVENOX) injection 40 mg Start: 11/16/23 2200 Code Status: Full code Family Communication:   Awaiting placement in inpatient psych   Subjective: Seen at bedside, no new complaints. Tells me various stories but some of them does not make sense.   Examination:  General exam: Appears calm and comfortable  Respiratory system: Clear to auscultation. Respiratory effort normal. Cardiovascular system: S1 & S2 heard, RRR. No JVD, murmurs, rubs, gallops or clicks. No pedal edema. Gastrointestinal system: Abdomen is nondistended, soft and nontender. No organomegaly or masses felt. Normal bowel sounds heard. Central nervous system: Alert and oriented. No focal neurological deficits. Extremities: Symmetric 4 x 5 power. Skin: No rashes, lesions or ulcers Psychiatry: Judgement and insight appear poor                Diet Orders (From admission, onward)     Start     Ordered   11/17/23 0000  Diet - low sodium heart healthy        11/17/23 1046   11/17/23 0000  Diet Carb Modified        11/17/23 1046   11/16/23 2102  Diet Carb Modified Fluid consistency: Thin; Room service appropriate? Yes  Diet effective now       Question Answer Comment  Diet-HS Snack? Nothing   Calorie Level Medium 1600-2000   Fluid consistency: Thin   Room service appropriate? Yes      11/16/23 2101            Objective: Vitals:   11/19/23 2232 11/20/23 0520 11/20/23  0747 11/20/23 1213  BP:  135/69  (!) 139/95  Pulse:  65  65  Resp:  16  17  Temp:  98 F (36.7 C)  (!) 97.5 F (36.4 C)  TempSrc:  Oral  Oral  SpO2: 100% 100% 98% 100%  Weight:      Height:        Intake/Output Summary (Last 24 hours) at 11/20/2023 1227 Last data filed at 11/20/2023 1000 Gross per 24 hour  Intake 958 ml  Output --  Net 958 ml   Filed Weights   11/16/23  2314  Weight: 79 kg    Scheduled Meds:  amLODipine  10 mg Oral Daily   donepezil  5 mg Oral QHS   enoxaparin (LOVENOX) injection  40 mg Subcutaneous Q24H   insulin aspart  0-15 Units Subcutaneous TID WC   insulin aspart  0-5 Units Subcutaneous QHS   insulin aspart  3 Units Subcutaneous TID WC   insulin glargine-yfgn  10 Units Subcutaneous QHS   losartan  100 mg Oral Daily   mometasone-formoterol  2 puff Inhalation BID   rosuvastatin  20 mg Oral Daily   sertraline  50 mg Oral Daily   Continuous Infusions:  Nutritional status     Body mass index is 31.85 kg/m.  Data Reviewed:   CBC: Recent Labs  Lab 11/16/23 1235 11/20/23 0529  WBC 7.7 8.9  NEUTROABS 5.5  --   HGB 14.6 13.9  HCT 42.7 41.7  MCV 85.9 87.2  PLT 229 226   Basic Metabolic Panel: Recent Labs  Lab 11/16/23 1235 11/20/23 0529  NA 134* 139  K 3.7 3.7  CL 102 106  CO2 24 26  GLUCOSE 339* 129*  BUN 17 18  CREATININE 0.67 0.56  CALCIUM 9.5 9.4  MG  --  2.2   GFR: Estimated Creatinine Clearance: 66.5 mL/min (by C-G formula based on SCr of 0.56 mg/dL). Liver Function Tests: Recent Labs  Lab 11/16/23 1235  AST 27  ALT 25  ALKPHOS 77  BILITOT 1.0  PROT 7.2  ALBUMIN 4.2   No results for input(s): "LIPASE", "AMYLASE" in the last 168 hours. No results for input(s): "AMMONIA" in the last 168 hours. Coagulation Profile: No results for input(s): "INR", "PROTIME" in the last 168 hours. Cardiac Enzymes: No results for input(s): "CKTOTAL", "CKMB", "CKMBINDEX", "TROPONINI" in the last 168 hours. BNP (last 3 results) No results for input(s): "PROBNP" in the last 8760 hours. HbA1C: No results for input(s): "HGBA1C" in the last 72 hours. CBG: Recent Labs  Lab 11/19/23 1212 11/19/23 1740 11/19/23 2128 11/20/23 0743 11/20/23 1204  GLUCAP 256* 184* 308* 126* 226*   Lipid Profile: No results for input(s): "CHOL", "HDL", "LDLCALC", "TRIG", "CHOLHDL", "LDLDIRECT" in the last 72 hours. Thyroid  Function Tests: Recent Labs    11/18/23 1233  TSH 2.385   Anemia Panel: Recent Labs    11/18/23 1233  VITAMINB12 537   Sepsis Labs: No results for input(s): "PROCALCITON", "LATICACIDVEN" in the last 168 hours.  Recent Results (from the past 240 hours)  Urine Culture     Status: Abnormal   Collection Time: 11/16/23 12:50 PM   Specimen: Urine, Clean Catch  Result Value Ref Range Status   Specimen Description   Final    URINE, CLEAN CATCH Performed at [facility], [address] [place], [place] [address]    Special Requests   Final    NONE Performed at [facility], [address]  [place], [place] [address]    Culture (A)  Final    <10,000 COLONIES/mL INSIGNIFICANT GROWTH Performed at [facility], [address], [place], [place] [address]    Report Status 11/17/2023 FINAL  Final         Radiology Studies: No results found.         LOS: 0 days   Time spent= 35 mins    Miguel Rota, MD [facility]  If 7PM-7AM, please contact night-coverage  11/20/2023, 12:27 PM

## 2023-11-20 NOTE — Inpatient Diabetes Management (Signed)
Inpatient Diabetes Program Recommendations  AACE/ADA: New Consensus Statement on Inpatient Glycemic Control (2015)  Target Ranges:  Prepandial:   less than 140 mg/dL      Peak postprandial:   less than 180 mg/dL (1-2 hours)      Critically ill patients:  140 - 180 mg/dL    Latest Reference Range & Units 11/19/23 08:28 11/19/23 12:12 11/19/23 17:40 11/19/23 21:28  Glucose-Capillary 70 - 99 mg/dL 161 (H)  8 units Novolog  256 (H)  11 units Novolog  184 (H)  6 units Novolog  308 (H)  4 units Novolog  10 units [place]  (H): Data is abnormally high  Latest Reference Range & Units 11/20/23 07:43 11/20/23 12:04  Glucose-Capillary 70 - 99 mg/dL 096 (H)  5 units Novolog  226 (H)  8 units Novolog   (H): Data is abnormally high    Home DM Meds: Tresiba 10 every day Actos 45 mg every day Ozempic 2 mg weekly   Current Orders: Semglee 10 units QPM Novolog Moderate Correction Scale/ SSI (0-15 units) TID AC + HS Novolog 3 units TID with meals   MD- Please increase Novolog Meal Coverage to 5 units TID with meals    --Will follow patient during hospitalization--  Ambrose Finland RN, MSN, CDCES Diabetes Coordinator Inpatient Glycemic Control Team Team Pager: [phone] (8a-5p)

## 2023-11-20 NOTE — TOC Progression Note (Addendum)
Transition of Care [facility]) - Progression Note    Patient Details  Name: Natasha Chandler MRN: [id number] Date of Birth: 1956/07/25  [facility] CM/SW Contact  Otelia Santee, LCSW Phone Number: 11/20/2023, 1:30 PM  Clinical Narrative:    CSW continuing to seek inpatient psychiatric placement.   Have contacted the following facilities: [facility]- No bed availability [facility]- unable to accept pt's w/ Dementia dx  [facility]-No bed availability  [address]- Declined due to dementia dx and medical issues [facility]- Voicemail left [facility]- Unable to locate referral. New referral sent in.        Barriers to Discharge: No Barriers Identified  Expected Discharge Plan and Services         Expected Discharge Date: 11/17/23                                     Social Determinants of Health (SDOH) Interventions SDOH Screenings   Food Insecurity: No Food Insecurity (11/16/2023)  Housing: Low Risk  (11/16/2023)  Transportation Needs: No Transportation Needs (11/16/2023)  Utilities: Not At Risk (11/16/2023)  Alcohol Screen: Low Risk  (08/13/2020)  Depression (PHQ2-9): Medium Risk (08/13/2020)  Financial Resource Strain: Medium Risk (08/13/2020)  Physical Activity: Insufficiently Active (08/13/2020)  Social Connections: Unknown (08/13/2020)  Stress: Stress Concern Present (08/13/2020)  Tobacco Use: Low Risk  (11/16/2023)    Readmission Risk Interventions     No data to display

## 2023-11-20 NOTE — Progress Notes (Deleted)
Mobility Specialist - Progress Note   11/20/23 1345  Oxygen Therapy  SpO2 (!) 88 %  O2 Device Room Air  Patient Activity (if Appropriate) Ambulating  Mobility  Activity Ambulated independently in hallway  Level of Assistance Independent  Assistive Device None  Distance Ambulated (ft) 230 ft  Activity Response Tolerated well  Mobility Referral Yes  Mobility visit 1 Mobility  Mobility Specialist Start Time (ACUTE ONLY) 1337  Mobility Specialist Stop Time (ACUTE ONLY) 1344  Mobility Specialist Time Calculation (min) (ACUTE ONLY) 7 min   Pt received in bed and agreeable to mobility. Desat to 88% on RA while ambulating. Encouraged pursed lip breaths allowing O2 to come up to 92%. RN notified. No complaints during session. Pt to bed after session with all needs met.    During mobility: 88% SpO2 Post-mobility: 138 HR, 92% SPO2  Chief Technology Officer
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
## 2023-11-21 DIAGNOSIS — N3000 Acute cystitis without hematuria: Secondary | ICD-10-CM | POA: Diagnosis not present

## 2023-11-21 DIAGNOSIS — F03918 Unspecified dementia, unspecified severity, with other behavioral disturbance: Secondary | ICD-10-CM | POA: Diagnosis not present

## 2023-11-21 LAB — CBC
HCT: 41.1 % (ref 36.0–46.0)
Hemoglobin: 13.9 g/dL (ref 12.0–15.0)
MCH: 29.8 pg (ref 26.0–34.0)
MCHC: 33.8 g/dL (ref 30.0–36.0)
MCV: 88 fL (ref 80.0–100.0)
Platelets: 215 10*3/uL (ref 150–400)
RBC: 4.67 MIL/uL (ref 3.87–5.11)
RDW: 13.2 % (ref 11.5–15.5)
WBC: 8.4 10*3/uL (ref 4.0–10.5)
nRBC: 0 % (ref 0.0–0.2)

## 2023-11-21 LAB — BASIC METABOLIC PANEL
Anion gap: 7 (ref 5–15)
BUN: 11 mg/dL (ref 8–23)
CO2: 24 mmol/L (ref 22–32)
Calcium: 9.3 mg/dL (ref 8.9–10.3)
Chloride: 107 mmol/L (ref 98–111)
Creatinine, Ser: 0.51 mg/dL (ref 0.44–1.00)
GFR, Estimated: >60 mL/min (ref >60)
Glucose, Bld: 139 mg/dL — ABNORMAL HIGH (ref 70–99)
Potassium: 4.2 mmol/L (ref 3.5–5.1)
Sodium: 138 mmol/L (ref 135–145)

## 2023-11-21 LAB — GLUCOSE, CAPILLARY
Glucose-Capillary: 163 mg/dL — ABNORMAL HIGH (ref 70–99)
Glucose-Capillary: 310 mg/dL — ABNORMAL HIGH (ref 70–99)
Glucose-Capillary: 327 mg/dL — ABNORMAL HIGH (ref 70–99)
Glucose-Capillary: 216 mg/dL — ABNORMAL HIGH (ref 70–99)

## 2023-11-21 LAB — MAGNESIUM: Magnesium: 2.2 mg/dL (ref 1.7–2.4)

## 2023-11-21 MED ORDER — INSULIN ASPART 100 UNIT/ML IJ SOLN
6.0000 [IU] | Freq: Three times a day (TID) | INTRAMUSCULAR | Status: DC
  Administered 2023-11-21 – 2023-11-22 (×3): 6 [IU] via SUBCUTANEOUS

## 2023-11-21 MED ORDER — INSULIN GLARGINE-YFGN 100 UNIT/ML ~~LOC~~ SOLN
13.0000 [IU] | Freq: Every day | SUBCUTANEOUS | Status: DC
  Administered 2023-11-21 – 2023-11-24 (×4): 13 [IU] via SUBCUTANEOUS
  Filled 2023-11-21 (×4): qty 0.13

## 2023-11-21 MED ORDER — LORATADINE 10 MG PO TABS
10.0000 mg | ORAL_TABLET | Freq: Every day | ORAL | Status: DC | PRN
  Administered 2023-11-21 – 2023-11-23 (×2): 10 mg via ORAL
  Filled 2023-11-21 (×2): qty 1

## 2023-11-21 NOTE — Inpatient Diabetes Management (Addendum)
Inpatient Diabetes Program Recommendations  AACE/ADA: New Consensus Statement on Inpatient Glycemic Control (2015)  Target Ranges:  Prepandial:   less than 140 mg/dL      Peak postprandial:   less than 180 mg/dL (1-2 hours)      Critically ill patients:  140 - 180 mg/dL    Latest Reference Range & Units 11/20/23 07:43 11/20/23 12:04 11/20/23 17:17 11/20/23 20:27  Glucose-Capillary 70 - 99 mg/dL 478 (H)  5 units Novolog  226 (H)  8 units Novolog  289 (H)  13 units Novolog  221 (H)  2 units Novolog  10 units Semglee  (H): Data is abnormally high  Latest Reference Range & Units 11/21/23 09:33 11/21/23 11:32  Glucose-Capillary 70 - 99 mg/dL 295 (H)  8 units Novolog  310 (H)  16 units Novolog   (H): Data is abnormally high    Home DM Meds: Tresiba 10 every day Actos 45 mg every day Ozempic 2 mg weekly    Current Orders: Semglee 10 units QPM Novolog Moderate Correction Scale/ SSI (0-15 units) TID AC + HS Novolog 5 units TID with meals     MD- Please increase Novolog Meal Coverage to 8 units TID with meals    --Will follow patient during hospitalization--  Ambrose Finland RN, MSN, CDCES Diabetes Coordinator Inpatient Glycemic Control Team Team Pager: [phone] (8a-5p)

## 2023-11-21 NOTE — TOC Progression Note (Signed)
Transition of Care [facility]) - Progression Note    Patient Details  Name: Natasha Chandler MRN: [id number] Date of Birth: [birth year]  Transition of [facility]) CM/SW Contact  Otelia Santee, LCSW Phone Number: 11/21/2023, 1:18 PM  Clinical Narrative:    Additional referrals sent out for inpatient geri psych placement.     Barriers to Discharge: No Barriers Identified  Expected Discharge Plan and Services         Expected Discharge Date: 11/17/23                                     Social Determinants of Health (SDOH) Interventions SDOH Screenings   Food Insecurity: No Food Insecurity (11/16/2023)  Housing: Low Risk  (11/16/2023)  Transportation Needs: No Transportation Needs (11/16/2023)  Utilities: Not At Risk (11/16/2023)  Alcohol Screen: Low Risk  (08/13/2020)  Depression (PHQ2-9): Medium Risk (08/13/2020)  Financial Resource Strain: Medium Risk (08/13/2020)  Physical Activity: Insufficiently Active (08/13/2020)  Social Connections: Unknown (08/13/2020)  Stress: Stress Concern Present (08/13/2020)  Tobacco Use: Low Risk  (11/16/2023)    Readmission Risk Interventions     No data to display

## 2023-11-21 NOTE — Progress Notes (Addendum)
Mobility Specialist - Progress Note   11/21/23 1514  Mobility  Activity Ambulated independently in hallway  Level of Assistance Independent  Assistive Device None  Distance Ambulated (ft) 500 ft  Activity Response Tolerated well  Mobility Referral Yes  Mobility visit 1 Mobility  Mobility Specialist Start Time (ACUTE ONLY) 1503  Mobility Specialist Stop Time (ACUTE ONLY) 1514  Mobility Specialist Time Calculation (min) (ACUTE ONLY) 11 min   Pt received EOB and agreeable to mobility. No complaints during session. Pt to EOB after session with all needs met. Sitter in room.  [facility]
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
## 2023-11-21 NOTE — Progress Notes (Signed)
PROGRESS NOTE    Natasha Chandler  ZHY:[id number] DOB: 1956-05-07 DOA: 11/16/2023 PCP: Judd Lien, PA-C    Brief Narrative:  56 old female, reportedly lives alone in an apartment complex, states that her sister lives nearby and intermittently comes stays with her, medical history significant for poorly controlled type II DM/IDDM, HTN, HLD, acute ischemic stroke 05/2023 with reported left-sided deficits, hypothyroidism, asthma, early dementia, vitamin D deficiency, brought in by EMS to ED on 11/16/2023 due to concern for altered mental status. As per EDP note, patient presented to the ED with confusion and neighbors called 911 because she was confused but also has history of being confused. She was reportedly knocking on neighbors door.  Patient has had aggressive behavior.  Upon admission AAO X4.  Concerns of possible acute cystitis started on Macrobid which was later discontinued.  Due to aggressive behavior, psychiatry was consulted. Overall patient is medically doing better but clearly due to behavioral issues, she needs to be admitted inpatient psych.  Assessment & Plan:  Principal Problem:   UTI (urinary tract infection)   Dementia with behavioral abnormalities Worsening dementia with erratic behavior and concerns of safety.  Patient clearly has lack of medical decision making capacity and is involuntary committed.  Psychiatry team has been consulted.  Metabolic workup thus far is negative.  MRI brain shows chronic changes.  Currently on Aricept.  Acute cystitis ruled out. Dysuria due to undetermined cause Initially thought to be concerning infection and received Macrobid.  Eventually insignificant colony/growth therefore antibiotics discontinued.   T2DM, poorly controlled Currently on long-acting regimen with sliding scale and Accu-Chek.  Will further adjust as necessary.   HTN Norvasc, losartan.  IV as needed   HLD -Continue rosuvastatin    History of CVA Chronic.  Continue  statin   Depression -Continue sertraline   Asthma Broncho dilators as needed   Body mass index is 31.85 kg/m.   Medically cleared to be transferred to inpatient psych.  Her psychiatry treatment can continue at the facility.  DVT prophylaxis: enoxaparin (LOVENOX) injection 40 mg Start: 11/16/23 2200 Code Status: Full code Family Communication:   Awaiting placement in inpatient psych MEDICALLY CLEARED  Subjective: Patient sitting at bedside, does not have any complaints at this time.   Examination:  General exam: Appears calm and comfortable  Respiratory system: Clear to auscultation. Respiratory effort normal. Cardiovascular system: S1 & S2 heard, RRR. No JVD, murmurs, rubs, gallops or clicks. No pedal edema. Gastrointestinal system: Abdomen is nondistended, soft and nontender. No organomegaly or masses felt. Normal bowel sounds heard. Central nervous system: Alert and oriented to name. No focal neurological deficits. Extremities: Symmetric 4 x 5 power. Skin: No rashes, lesions or ulcers Psychiatry: Judgement and insight appear poor                Diet Orders (From admission, onward)     Start     Ordered   11/17/23 0000  Diet - low sodium heart healthy        11/17/23 1046   11/17/23 0000  Diet Carb Modified        11/17/23 1046   11/16/23 2102  Diet Carb Modified Fluid consistency: Thin; Room service appropriate? Yes  Diet effective now       Question Answer Comment  Diet-HS Snack? Nothing   Calorie Level Medium 1600-2000   Fluid consistency: Thin   Room service appropriate? Yes      11/16/23 2101  Objective: Vitals:   11/20/23 2026 11/20/23 2030 11/21/23 0611 11/21/23 0851  BP:  (!) 145/62 (!) 119/55   Pulse:  (!) 58 (!) 52   Resp:  20 18   Temp:  97.8 F (36.6 C) 98.2 F (36.8 C)   TempSrc:  Oral    SpO2: 98% 99% 99% 100%  Weight:      Height:        Intake/Output Summary (Last 24 hours) at 11/21/2023 1226 Last data  filed at 11/21/2023 1100 Gross per 24 hour  Intake 660 ml  Output --  Net 660 ml   Filed Weights   11/16/23 2314  Weight: 79 kg    Scheduled Meds:  amLODipine  10 mg Oral Daily   donepezil  5 mg Oral QHS   enoxaparin (LOVENOX) injection  40 mg Subcutaneous Q24H   insulin aspart  0-15 Units Subcutaneous TID WC   insulin aspart  0-5 Units Subcutaneous QHS   insulin aspart  6 Units Subcutaneous TID WC   insulin glargine-yfgn  13 Units Subcutaneous QHS   losartan  100 mg Oral Daily   mometasone-formoterol  2 puff Inhalation BID   risperiDONE  0.25 mg Oral QHS   rosuvastatin  20 mg Oral Daily   sertraline  50 mg Oral Daily   Continuous Infusions:  Nutritional status     Body mass index is 31.85 kg/m.  Data Reviewed:   CBC: Recent Labs  Lab 11/16/23 1235 11/20/23 0529 11/21/23 0540  WBC 7.7 8.9 8.4  NEUTROABS 5.5  --   --   HGB 14.6 13.9 13.9  HCT 42.7 41.7 41.1  MCV 85.9 87.2 88.0  PLT 229 226 215   Basic Metabolic Panel: Recent Labs  Lab 11/16/23 1235 11/20/23 0529 11/21/23 0540  NA 134* 139 138  K 3.7 3.7 4.2  CL 102 106 107  CO2 24 26 24   GLUCOSE 339* 129* 139*  BUN 17 18 11   CREATININE 0.67 0.56 0.51  CALCIUM 9.5 9.4 9.3  MG  --  2.2 2.2   GFR: Estimated Creatinine Clearance: 66.5 mL/min (by C-G formula based on SCr of 0.51 mg/dL). Liver Function Tests: Recent Labs  Lab 11/16/23 1235  AST 27  ALT 25  ALKPHOS 77  BILITOT 1.0  PROT 7.2  ALBUMIN 4.2   No results for input(s): "LIPASE", "AMYLASE" in the last 168 hours. No results for input(s): "AMMONIA" in the last 168 hours. Coagulation Profile: No results for input(s): "INR", "PROTIME" in the last 168 hours. Cardiac Enzymes: No results for input(s): "CKTOTAL", "CKMB", "CKMBINDEX", "TROPONINI" in the last 168 hours. BNP (last 3 results) No results for input(s): "PROBNP" in the last 8760 hours. HbA1C: No results for input(s): "HGBA1C" in the last 72 hours. CBG: Recent Labs  Lab  11/20/23 1204 11/20/23 1717 11/20/23 2027 11/21/23 0933 11/21/23 1132  GLUCAP 226* 289* 221* 163* 310*   Lipid Profile: No results for input(s): "CHOL", "HDL", "LDLCALC", "TRIG", "CHOLHDL", "LDLDIRECT" in the last 72 hours. Thyroid Function Tests: Recent Labs    11/18/23 1233  TSH 2.385   Anemia Panel: Recent Labs    11/18/23 1233  VITAMINB12 537   Sepsis Labs: No results for input(s): "PROCALCITON", "LATICACIDVEN" in the last 168 hours.  Recent Results (from the past 240 hours)  Urine Culture     Status: Abnormal   Collection Time: 11/16/23 12:50 PM   Specimen: Urine, Clean Catch  Result Value Ref Range Status   Specimen Description   Final  URINE, CLEAN CATCH Performed at [facility], [address] [place], [place] [address]    Special Requests   Final    NONE Performed at [facility], [address] [place], [place] [address]    Culture (A)  Final    <10,000 COLONIES/mL INSIGNIFICANT GROWTH Performed at [facility], [address], [place], [place] [address]    Report Status 11/17/2023 FINAL  Final         Radiology Studies: No results found.         LOS: 0 days   Time spent= 35 mins    Miguel Rota, MD [facility]  If 7PM-7AM, please contact night-coverage  11/21/2023, 12:26 PM

## 2023-11-22 DIAGNOSIS — F323 Major depressive disorder, single episode, severe with psychotic features: Secondary | ICD-10-CM | POA: Diagnosis present

## 2023-11-22 DIAGNOSIS — J45909 Unspecified asthma, uncomplicated: Secondary | ICD-10-CM | POA: Diagnosis not present

## 2023-11-22 DIAGNOSIS — N3000 Acute cystitis without hematuria: Secondary | ICD-10-CM | POA: Diagnosis not present

## 2023-11-22 DIAGNOSIS — R45850 Homicidal ideations: Secondary | ICD-10-CM

## 2023-11-22 DIAGNOSIS — F22 Delusional disorders: Secondary | ICD-10-CM | POA: Diagnosis not present

## 2023-11-22 DIAGNOSIS — I10 Essential (primary) hypertension: Secondary | ICD-10-CM | POA: Diagnosis not present

## 2023-11-22 DIAGNOSIS — N390 Urinary tract infection, site not specified: Secondary | ICD-10-CM | POA: Diagnosis not present

## 2023-11-22 DIAGNOSIS — E119 Type 2 diabetes mellitus without complications: Secondary | ICD-10-CM | POA: Diagnosis not present

## 2023-11-22 DIAGNOSIS — F0390 Unspecified dementia without behavioral disturbance: Secondary | ICD-10-CM | POA: Diagnosis not present

## 2023-11-22 LAB — CBC
HCT: 43.3 % (ref 36.0–46.0)
Hemoglobin: 13.8 g/dL (ref 12.0–15.0)
MCH: 28.5 pg (ref 26.0–34.0)
MCHC: 31.9 g/dL (ref 30.0–36.0)
MCV: 89.3 fL (ref 80.0–100.0)
Platelets: 204 10*3/uL (ref 150–400)
RBC: 4.85 MIL/uL (ref 3.87–5.11)
RDW: 13.2 % (ref 11.5–15.5)
WBC: 9.5 10*3/uL (ref 4.0–10.5)
nRBC: 0 % (ref 0.0–0.2)

## 2023-11-22 LAB — BASIC METABOLIC PANEL
Anion gap: 9 (ref 5–15)
BUN: 21 mg/dL (ref 8–23)
CO2: 23 mmol/L (ref 22–32)
Calcium: 9.5 mg/dL (ref 8.9–10.3)
Chloride: 108 mmol/L (ref 98–111)
Creatinine, Ser: 0.42 mg/dL — ABNORMAL LOW (ref 0.44–1.00)
GFR, Estimated: >60 mL/min (ref >60)
Glucose, Bld: 137 mg/dL — ABNORMAL HIGH (ref 70–99)
Potassium: 4.1 mmol/L (ref 3.5–5.1)
Sodium: 140 mmol/L (ref 135–145)

## 2023-11-22 LAB — GLUCOSE, CAPILLARY
Glucose-Capillary: 165 mg/dL — ABNORMAL HIGH (ref 70–99)
Glucose-Capillary: 332 mg/dL — ABNORMAL HIGH (ref 70–99)
Glucose-Capillary: 135 mg/dL — ABNORMAL HIGH (ref 70–99)
Glucose-Capillary: 252 mg/dL — ABNORMAL HIGH (ref 70–99)

## 2023-11-22 LAB — MAGNESIUM: Magnesium: 2.2 mg/dL (ref 1.7–2.4)

## 2023-11-22 MED ORDER — RISPERIDONE 0.5 MG PO TBDP
0.5000 mg | ORAL_TABLET | Freq: Every day | ORAL | Status: DC
Start: 2023-11-22 — End: 2023-11-25
  Administered 2023-11-22 – 2023-11-24 (×3): 0.5 mg via ORAL
  Filled 2023-11-22 (×3): qty 1

## 2023-11-22 MED ORDER — SERTRALINE HCL 100 MG PO TABS
100.0000 mg | ORAL_TABLET | Freq: Every day | ORAL | Status: DC
  Administered 2023-11-23 – 2023-11-25 (×3): 100 mg via ORAL
  Filled 2023-11-22 (×3): qty 1

## 2023-11-22 MED ORDER — INSULIN ASPART 100 UNIT/ML IJ SOLN
8.0000 [IU] | Freq: Three times a day (TID) | INTRAMUSCULAR | Status: DC
  Administered 2023-11-22 – 2023-11-23 (×3): 8 [IU] via SUBCUTANEOUS

## 2023-11-22 NOTE — Plan of Care (Signed)
  Problem: Coping: Goal: Ability to adjust to condition or change in health will improve Outcome: Progressing   Problem: Health Behavior/Discharge Planning: Goal: Ability to manage health-related needs will improve Outcome: Progressing   Problem: Nutritional: Goal: Maintenance of adequate nutrition will improve Outcome: Progressing   Problem: Skin Integrity: Goal: Risk for impaired skin integrity will decrease Outcome: Progressing   Problem: Clinical Measurements: Goal: Will remain free from infection Outcome: Progressing Goal: Diagnostic test results will improve Outcome: Progressing

## 2023-11-22 NOTE — TOC Progression Note (Signed)
Transition of Care [facility]) - Progression Note    Patient Details  Name: ANQUINETTE FOREMAN MRN: [id number] Date of Birth: Dec 07, 1955  [facility] CM/SW Contact  Otelia Santee, LCSW Phone Number: 11/22/2023, 2:01 PM  Clinical Narrative:    Continuing to seek inpatient psychiatric placement for pt.  [facility]- No bed availability  [facility]- Left VM [place]- Left VM [facility]- no answer on any telephone listed  [facility]- Informed that intake staff was leaving for the day and unsure if referral was reviewed but, "probably not." Requested for CSW to call back tomorrow.            Barriers to Discharge: No Barriers Identified  Expected Discharge Plan and Services         Expected Discharge Date: 11/17/23                                     Social Determinants of Health (SDOH) Interventions SDOH Screenings   Food Insecurity: No Food Insecurity (11/16/2023)  Housing: Low Risk  (11/16/2023)  Transportation Needs: No Transportation Needs (11/16/2023)  Utilities: Not At Risk (11/16/2023)  Alcohol Screen: Low Risk  (08/13/2020)  Depression (PHQ2-9): Medium Risk (08/13/2020)  Financial Resource Strain: Medium Risk (08/13/2020)  Physical Activity: Insufficiently Active (08/13/2020)  Social Connections: Unknown (08/13/2020)  Stress: Stress Concern Present (08/13/2020)  Tobacco Use: Low Risk  (11/16/2023)    Readmission Risk Interventions     No data to display

## 2023-11-22 NOTE — Progress Notes (Signed)
PROGRESS NOTE    Natasha Chandler  GUY:[id number] DOB: 1956-08-15 DOA: 11/16/2023 PCP: Judd Lien, PA-C   Brief Narrative:  The patient is a 1 old obese Caucasian female, reportedly lives alone in an apartment complex, states that her sister lives nearby and intermittently comes stays with her, medical history significant for poorly controlled type II DM/IDDM, HTN, HLD, acute ischemic stroke 05/2023 with reported left-sided deficits, hypothyroidism, asthma, early dementia, vitamin D deficiency, brought in by EMS to ED on 11/16/2023 due to concern for altered mental status. As per EDP note, patient presented to the ED with confusion and neighbors called 911 because she was confused but also has history of being confused. She was reportedly knocking on neighbors door.  Patient has had aggressive behavior.  Upon admission AAO X4.  Concerns of possible acute cystitis started on Macrobid which was later discontinued.  Due to aggressive behavior, psychiatry was consulted. Overall patient is medically doing better but clearly due to behavioral issues, she needs to be admitted inpatient psych.  Assessment & Plan:  Principal Problem:   UTI (urinary tract infection) Active Problems:   Delusional disorder (HCC)   Dementia with behavioral abnormalities -Worsening dementia with erratic behavior and concerns of safety.   -Patient had lack of medical decision making capacity and is involuntary committed.   -Psychiatry team has been consulted.   -Metabolic workup thus far is negative.   -MRI brain shows chronic changes.   -Continuing on Risperidone 0.25 mg nightly as well as Donepezil 5 mg p.o. nightly -Had MoCA testing yesterday and did poorly -Psychiatry consulted and recommending one-to-one observation and recommending inpatient geropsych given that she has an increase in confabulation and perseveration  Acute cystitis ruled out. Dysuria due to undetermined cause -Initially thought to be concerning  infection and received Macrobid.   -Eventually insignificant colony/growth therefore antibiotics discontinued.   T2DM, poorly controlled -Currently on long-acting regimen with Semglee 13 units subcu nightly with moderate NovoLog/scale insulin before meals and at bedtime and insulin aspart 6 units 3 times daily with meals but will go to 8 units 3 times daily with meals.   -Will further adjust as necessary. -CBG trend: Recent Labs  Lab 11/20/23 2027 11/21/23 0933 11/21/23 1132 11/21/23 1609 11/21/23 2047 11/22/23 0711 11/22/23 1144  GLUCAP 221* 163* 310* 327* 216* 165* 332*    Essential HTN -C/w Home Amlodipine 10 mg po Daily and losartan 100 mg p.o. daily -Continue to monitor blood pressures per protocol -Last blood pressure reading was on the softer side at 105/54   HLD -Continue Rosuvastatin 20 mg po Daily    History of CVA -Chronic.  Rosuvastatin 20 mg po Daily    Depression and anxiety -Continue Sertraline 50 mg po Daily and Risperidone 0.25 mg p.o. nightly   Asthma -C/w Bronchodilators as needed with Albuterol 3 mL IH q6hprn Wheezing and SOB -Also continue Dulera 2 puffs IH twice daily   Class I Obesity -Complicates overall prognosis and care -Estimated body mass index is 31.85 kg/m as calculated from the following:   Height as of this encounter: 5\' 2"  (1.575 m).   Weight as of this encounter: 79 kg.  -Weight Loss and Dietary Counseling given   Medically cleared to be transferred to inpatient psych.  Her psychiatry treatment can continue at the facility.    DVT prophylaxis: enoxaparin (LOVENOX) injection 40 mg Start: 11/16/23 2200    Code Status: Full Code Family Communication: No family currently at bedside  Disposition Plan:  Level  of care: Med-Surg Status is: Observation The patient will require care spanning > 2 midnights and should be moved to inpatient because: Needs inpatient psychiatric hospitalization and currently awaiting a geropsych bed    Consultants:  Psychiatry  Procedures:  As delineated as above  Antimicrobials:  Anti-infectives (From admission, onward)    Start     Dose/Rate Route Frequency Ordered Stop   11/17/23 1000  nitrofurantoin (macrocrystal-monohydrate) (MACROBID) capsule 100 mg  Status:  Discontinued        100 mg Oral Every 12 hours 11/17/23 0016 11/18/23 1211   11/17/23 0000  nitrofurantoin, macrocrystal-monohydrate, (MACROBID) 100 MG capsule  Status:  Discontinued        100 mg Oral 2 times daily 11/17/23 1046 11/18/23    11/16/23 2000  ciprofloxacin (CIPRO) IVPB 400 mg        400 mg 200 mL/hr over 60 Minutes Intravenous  Once 11/16/23 1958 11/17/23 0755   11/16/23 1345  nitrofurantoin (MACRODANTIN) capsule 50 mg        50 mg Oral  Once 11/16/23 1342 11/16/23 1455       Subjective: Seen and examined at bedside she is walking around the room.  Asking if he can go home for Christmas with her aunt.  No nausea or vomiting.  Appeared pleasant and calm.  No other concerns or complaints at this time.  Objective: Vitals:   11/21/23 2033 11/22/23 0532 11/22/23 0912 11/22/23 1147  BP: (!) 129/57 135/85  (!) 105/54  Pulse: 71 (!) 57  63  Resp: 15 14  17   Temp: 97.7 F (36.5 C) 98 F (36.7 C)  98.4 F (36.9 C)  TempSrc: Oral Oral  Oral  SpO2: 98% 100% 98% 99%  Weight:      Height:        Intake/Output Summary (Last 24 hours) at 11/22/2023 1316 Last data filed at 11/22/2023 1110 Gross per 24 hour  Intake 1158 ml  Output --  Net 1158 ml   Filed Weights   11/16/23 2314  Weight: 79 kg   Examination: Physical Exam:  Constitutional: WN/WD obese Caucasian female in no acute distress Respiratory: Diminished to auscultation bilaterally, no wheezing, rales, rhonchi or crackles. Normal respiratory effort and patient is not tachypenic. No accessory muscle use.  Unlabored breathing Cardiovascular: RRR, no murmurs / rubs / gallops. S1 and S2 auscultated.  Mild extremity edema.  Abdomen: Soft,  non-tender, distended secondary to body habitus. Bowel sounds positive.  GU: Deferred. Musculoskeletal: No clubbing / cyanosis of digits/nails. No joint deformity upper and lower extremities.  Skin: No rashes, lesions, ulcers on limited skin evaluation. No induration; Warm and dry.  Neurologic: CN 2-12 grossly intact with no focal deficits. Romberg sign and cerebellar reflexes not assessed.  Psychiatric: She is awake and alert  Data Reviewed: I have personally reviewed following labs and imaging studies  CBC: Recent Labs  Lab 11/16/23 1235 11/20/23 0529 11/21/23 0540 11/22/23 0619  WBC 7.7 8.9 8.4 9.5  NEUTROABS 5.5  --   --   --   HGB 14.6 13.9 13.9 13.8  HCT 42.7 41.7 41.1 43.3  MCV 85.9 87.2 88.0 89.3  PLT 229 226 215 204   Basic Metabolic Panel: Recent Labs  Lab 11/16/23 1235 11/20/23 0529 11/21/23 0540 11/22/23 0619  NA 134* 139 138 140  K 3.7 3.7 4.2 4.1  CL 102 106 107 108  CO2 24 26 24 23   GLUCOSE 339* 129* 139* 137*  BUN 17 18 11  21  CREATININE 0.67 0.56 0.51 0.42*  CALCIUM 9.5 9.4 9.3 9.5  MG  --  2.2 2.2 2.2   GFR: Estimated Creatinine Clearance: 66.5 mL/min (A) (by C-G formula based on SCr of 0.42 mg/dL (L)). Liver Function Tests: Recent Labs  Lab 11/16/23 1235  AST 27  ALT 25  ALKPHOS 77  BILITOT 1.0  PROT 7.2  ALBUMIN 4.2   No results for input(s): "LIPASE", "AMYLASE" in the last 168 hours. No results for input(s): "AMMONIA" in the last 168 hours. Coagulation Profile: No results for input(s): "INR", "PROTIME" in the last 168 hours. Cardiac Enzymes: No results for input(s): "CKTOTAL", "CKMB", "CKMBINDEX", "TROPONINI" in the last 168 hours. BNP (last 3 results) No results for input(s): "PROBNP" in the last 8760 hours. HbA1C: No results for input(s): "HGBA1C" in the last 72 hours. CBG: Recent Labs  Lab 11/21/23 1132 11/21/23 1609 11/21/23 2047 11/22/23 0711 11/22/23 1144  GLUCAP 310* 327* 216* 165* 332*   Lipid Profile: No results  for input(s): "CHOL", "HDL", "LDLCALC", "TRIG", "CHOLHDL", "LDLDIRECT" in the last 72 hours. Thyroid Function Tests: No results for input(s): "TSH", "T4TOTAL", "FREET4", "T3FREE", "THYROIDAB" in the last 72 hours. Anemia Panel: No results for input(s): "VITAMINB12", "FOLATE", "FERRITIN", "TIBC", "IRON", "RETICCTPCT" in the last 72 hours. Sepsis Labs: No results for input(s): "PROCALCITON", "LATICACIDVEN" in the last 168 hours.  Recent Results (from the past 240 hours)  Urine Culture     Status: Abnormal   Collection Time: 11/16/23 12:50 PM   Specimen: Urine, Clean Catch  Result Value Ref Range Status   Specimen Description   Final    URINE, CLEAN CATCH Performed at [facility], [address], [place], [place] [address]    Special Requests   Final    NONE Performed at [facility], [address], [place], [place] [address]    Culture (A)  Final    <10,000 COLONIES/mL INSIGNIFICANT GROWTH Performed at [facility], [address], [place], [place] [address]    Report Status 11/17/2023 FINAL  Final    Radiology Studies: No results found.  Scheduled Meds:  amLODipine  10 mg Oral Daily   donepezil  5 mg Oral QHS   enoxaparin (LOVENOX) injection  40 mg Subcutaneous Q24H   insulin aspart  0-15 Units Subcutaneous TID WC   insulin aspart  0-5 Units Subcutaneous QHS   insulin aspart  8 Units Subcutaneous TID WC   insulin glargine-yfgn  13 Units Subcutaneous QHS   losartan  100 mg Oral Daily   mometasone-formoterol  2 puff Inhalation BID   risperiDONE  0.25 mg Oral QHS   rosuvastatin  20 mg Oral Daily   sertraline  50 mg Oral Daily   Continuous Infusions:   LOS: 0 days   Marguerita Merles, DO [facility] Available via Epic secure chat 7am-7pm After these hours, please refer to coverage provider listed on [email or web address] 11/22/2023, 1:16 PM
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
## 2023-11-22 NOTE — Progress Notes (Signed)
Mobility Specialist - Progress Note   11/22/23 1407  Mobility  Activity Ambulated independently in hallway  Level of Assistance Independent  Assistive Device None  Distance Ambulated (ft) 500 ft  Activity Response Tolerated well  Mobility Referral Yes  Mobility visit 1 Mobility  Mobility Specialist Start Time (ACUTE ONLY) 1356  Mobility Specialist Stop Time (ACUTE ONLY) 1407  Mobility Specialist Time Calculation (min) (ACUTE ONLY) 11 min   Pt received in hallway walking with sitter and agreeable to mobility. No complaints during session. Pt to bed after session with all needs met. Sitter in room.    [facility]

## 2023-11-22 NOTE — Plan of Care (Signed)

## 2023-11-23 DIAGNOSIS — N3000 Acute cystitis without hematuria: Secondary | ICD-10-CM | POA: Diagnosis not present

## 2023-11-23 DIAGNOSIS — F0390 Unspecified dementia without behavioral disturbance: Secondary | ICD-10-CM | POA: Diagnosis not present

## 2023-11-23 LAB — COMPREHENSIVE METABOLIC PANEL
ALT: 27 U/L (ref 0–44)
AST: 24 U/L (ref 15–41)
Albumin: 3.5 g/dL (ref 3.5–5.0)
Alkaline Phosphatase: 63 U/L (ref 38–126)
Anion gap: 8 (ref 5–15)
BUN: 21 mg/dL (ref 8–23)
CO2: 22 mmol/L (ref 22–32)
Calcium: 9.2 mg/dL (ref 8.9–10.3)
Chloride: 108 mmol/L (ref 98–111)
Creatinine, Ser: 0.39 mg/dL — ABNORMAL LOW (ref 0.44–1.00)
GFR, Estimated: >60 mL/min (ref >60)
Glucose, Bld: 200 mg/dL — ABNORMAL HIGH (ref 70–99)
Potassium: 4 mmol/L (ref 3.5–5.1)
Sodium: 138 mmol/L (ref 135–145)
Total Bilirubin: 0.6 mg/dL (ref <1.2)
Total Protein: 6.2 g/dL — ABNORMAL LOW (ref 6.5–8.1)

## 2023-11-23 LAB — CBC WITH DIFFERENTIAL/PLATELET
Abs Immature Granulocytes: 0.02 10*3/uL (ref 0.00–0.07)
Basophils Absolute: 0.1 10*3/uL (ref 0.0–0.1)
Basophils Relative: 1 %
Eosinophils Absolute: 0.1 10*3/uL (ref 0.0–0.5)
Eosinophils Relative: 2 %
HCT: 41.7 % (ref 36.0–46.0)
Hemoglobin: 13.4 g/dL (ref 12.0–15.0)
Immature Granulocytes: 0 %
Lymphocytes Relative: 30 %
Lymphs Abs: 2.5 10*3/uL (ref 0.7–4.0)
MCH: 29.3 pg (ref 26.0–34.0)
MCHC: 32.1 g/dL (ref 30.0–36.0)
MCV: 91 fL (ref 80.0–100.0)
Monocytes Absolute: 0.8 10*3/uL (ref 0.1–1.0)
Monocytes Relative: 9 %
Neutro Abs: 5.1 10*3/uL (ref 1.7–7.7)
Neutrophils Relative %: 58 %
Platelets: 205 10*3/uL (ref 150–400)
RBC: 4.58 MIL/uL (ref 3.87–5.11)
RDW: 13.3 % (ref 11.5–15.5)
WBC: 8.5 10*3/uL (ref 4.0–10.5)
nRBC: 0 % (ref 0.0–0.2)

## 2023-11-23 LAB — GLUCOSE, CAPILLARY
Glucose-Capillary: 190 mg/dL — ABNORMAL HIGH (ref 70–99)
Glucose-Capillary: 251 mg/dL — ABNORMAL HIGH (ref 70–99)
Glucose-Capillary: 174 mg/dL — ABNORMAL HIGH (ref 70–99)
Glucose-Capillary: 214 mg/dL — ABNORMAL HIGH (ref 70–99)

## 2023-11-23 LAB — PHOSPHORUS: Phosphorus: 3.9 mg/dL (ref 2.5–4.6)

## 2023-11-23 LAB — MAGNESIUM: Magnesium: 2.3 mg/dL (ref 1.7–2.4)

## 2023-11-23 MED ORDER — INSULIN ASPART 100 UNIT/ML IJ SOLN
10.0000 [IU] | Freq: Three times a day (TID) | INTRAMUSCULAR | Status: DC
  Administered 2023-11-23 – 2023-11-25 (×4): 10 [IU] via SUBCUTANEOUS

## 2023-11-23 NOTE — Inpatient Diabetes Management (Signed)
Inpatient Diabetes Program Recommendations  AACE/ADA: New Consensus Statement on Inpatient Glycemic Control (2015)  Target Ranges:  Prepandial:   less than 140 mg/dL      Peak postprandial:   less than 180 mg/dL (1-2 hours)      Critically ill patients:  140 - 180 mg/dL    Latest Reference Range & Units 11/22/23 07:11 11/22/23 11:44 11/22/23 16:34 11/22/23 20:24  Glucose-Capillary 70 - 99 mg/dL 474 (H)  9 units Novolog @0850  332 (H)  17 units Novolog  135 (H)  10 units Novolog  252 (H)  3 units Novolog  13 units Semglee  (H): Data is abnormally high  Latest Reference Range & Units 11/23/23 07:40 11/23/23 11:54  Glucose-Capillary 70 - 99 mg/dL 259 (H)  11 units Novolog  251 (H)  (H): Data is abnormally high   Home DM Meds: Tresiba 10 every day Actos 45 mg every day Ozempic 2 mg weekly    Current Orders: Semglee 13 units QPM Novolog Moderate Correction Scale/ SSI (0-15 units) TID AC + HS Novolog 8 units TID with meals    MD- Note Novolog Meal Coverage increased last PM.  Post-meal CBGs still elevated  Please consider increasing the Novolog Meal Coverage to 10 units TID with meals    --Will follow patient during hospitalization--  Ambrose Finland RN, MSN, CDCES Diabetes Coordinator Inpatient Glycemic Control Team Team Pager: [phone] (8a-5p)

## 2023-11-23 NOTE — Progress Notes (Signed)
Mobility Specialist - Progress Note   11/23/23 1020  Mobility  Activity Ambulated independently in hallway  Level of Assistance Independent  Assistive Device None  Distance Ambulated (ft) 250 ft  Activity Response Tolerated well  Mobility Referral Yes  Mobility visit 1 Mobility  Mobility Specialist Start Time (ACUTE ONLY) 1016  Mobility Specialist Stop Time (ACUTE ONLY) 1020  Mobility Specialist Time Calculation (min) (ACUTE ONLY) 4 min   Pt received in bed and agreeable to mobility. No complaints during session. Pt to room w/ MDs after session with all needs met.    [facility]

## 2023-11-23 NOTE — Plan of Care (Signed)

## 2023-11-23 NOTE — TOC Progression Note (Signed)
Transition of Care [facility]) - Progression Note    Patient Details  Name: Natasha Chandler MRN: [id number] Date of Birth: 06/25/1956  Transition of Care [facility]) CM/SW Contact  Otelia Santee, LCSW Phone Number: 11/23/2023, 4:34 PM  Clinical Narrative:    IVC renewal complete. 3 copies placed on pt's chart.      Barriers to Discharge: No Barriers Identified  Expected Discharge Plan and Services         Expected Discharge Date: 11/17/23                                     Social Determinants of Health (SDOH) Interventions SDOH Screenings   Food Insecurity: No Food Insecurity (11/16/2023)  Housing: Low Risk  (11/16/2023)  Transportation Needs: No Transportation Needs (11/16/2023)  Utilities: Not At Risk (11/16/2023)  Alcohol Screen: Low Risk  (08/13/2020)  Depression (PHQ2-9): Medium Risk (08/13/2020)  Financial Resource Strain: Medium Risk (08/13/2020)  Physical Activity: Insufficiently Active (08/13/2020)  Social Connections: Unknown (08/13/2020)  Stress: Stress Concern Present (08/13/2020)  Tobacco Use: Low Risk  (11/16/2023)    Readmission Risk Interventions     No data to display

## 2023-11-23 NOTE — Progress Notes (Signed)
PROGRESS NOTE    Natasha Chandler  UJW:[id number] DOB: [birth year] DOA: 11/16/2023 PCP: Judd Lien, PA-C   Brief Narrative:  The patient is a 52 old obese Caucasian female, reportedly lives alone in an apartment complex, states that her sister lives nearby and intermittently comes stays with her, medical history significant for poorly controlled type II DM/IDDM, HTN, HLD, acute ischemic stroke 05/2023 with reported left-sided deficits, hypothyroidism, asthma, early dementia, vitamin D deficiency, brought in by EMS to ED on 11/16/2023 due to concern for altered mental status. As per EDP note, patient presented to the ED with confusion and neighbors called 911 because she was confused but also has history of being confused. She was reportedly knocking on neighbors door.  Patient has had aggressive behavior.  Upon admission AAO X4.  Concerns of possible acute cystitis started on Macrobid which was later discontinued.  Due to aggressive behavior, psychiatry was consulted.  She continued to have significant paranoid ideations.  Overall patient is medically doing better but clearly due to behavioral issues, she needs to be admitted inpatient psych and current bed availability still pending..  Assessment & Plan:  Principal Problem:   UTI (urinary tract infection) Active Problems:   Delusional disorder (HCC)   MDD (major depressive disorder), single episode, severe with psychotic features (HCC)   Homicidal ideations   Dementia with behavioral abnormalities -Worsening dementia with erratic behavior and concerns of safety.   -Patient had lack of medical decision making capacity and is involuntary committed.   -Psychiatry team has been consulted.   -Metabolic workup thus far is negative.   -MRI brain shows chronic changes.   -Continuing on Risperidone 0.25 mg nightly as well as Donepezil 5 mg p.o. nightly -Had MOCA testing yesterday and did poorly -Psychiatry consulted and recommending one-to-one  observation and recommending inpatient geropsych given that she has an increase in confabulation and perseveration; she continues to have paranoid delusions and psychiatry renewed her IVC and the current plan is to transfer to inpatient psychiatric facility once bed is available  Acute cystitis ruled out. Dysuria due to undetermined cause -Initially thought to be concerning infection and received Macrobid.   -Eventually insignificant colony/growth therefore antibiotics discontinued.   T2DM, poorly controlled -Currently on long-acting regimen with Semglee 13 units subcu nightly with moderate NovoLog/scale insulin before meals and at bedtime and insulin aspart 6 units 3 times daily with meals but will go to 8 units 3 times daily with meals.   -Will further adjust as necessary. -CBG trend: Recent Labs  Lab 11/21/23 2047 11/22/23 0711 11/22/23 1144 11/22/23 1634 11/22/23 2024 11/23/23 0740 11/23/23 1154  GLUCAP 216* 165* 332* 135* 252* 190* 251*    Essential HTN -C/w Home Amlodipine 10 mg po Daily and losartan 100 mg p.o. daily -Continue to monitor blood pressures per protocol -Last blood pressure reading was on the softer side at 114/60   HLD -Continue Rosuvastatin 20 mg po Daily    History of CVA -Chronic.  Rosuvastatin 20 mg po Daily    Depression and anxiety -Continue Sertraline 50 mg po Daily and Risperidone 0.25 mg p.o. nightly   Asthma -C/w Bronchodilators as needed with Albuterol 3 mL IH q6hprn Wheezing and SOB -Also continue Dulera 2 puffs IH twice daily   Class I Obesity -Complicates overall prognosis and care -Estimated body mass index is 31.85 kg/m as calculated from the following:   Height as of this encounter: 5\' 2"  (1.575 m).   Weight as of this encounter: 79 kg.  -  Weight Loss and Dietary Counseling given   Medically cleared to be transferred to inpatient psych.  Her psychiatry treatment can continue at the facility.   DVT prophylaxis: enoxaparin (LOVENOX)  injection 40 mg Start: 11/16/23 2200    Code Status: Full Code Family Communication: No family present at bedside  Disposition Plan:  Level of care: Med-Surg Status is: Observation The patient will require care spanning > 2 midnights and should be moved to inpatient because: Needs Inpatient Psychiatric Hospitalization   Consultants:  Psychiatry  Procedures:  As delineated as above  Antimicrobials:  Anti-infectives (From admission, onward)    Start     Dose/Rate Route Frequency Ordered Stop   11/17/23 1000  nitrofurantoin (macrocrystal-monohydrate) (MACROBID) capsule 100 mg  Status:  Discontinued        100 mg Oral Every 12 hours 11/17/23 0016 11/18/23 1211   11/17/23 0000  nitrofurantoin, macrocrystal-monohydrate, (MACROBID) 100 MG capsule  Status:  Discontinued        100 mg Oral 2 times daily 11/17/23 1046 11/18/23    11/16/23 2000  ciprofloxacin (CIPRO) IVPB 400 mg        400 mg 200 mL/hr over 60 Minutes Intravenous  Once 11/16/23 1958 11/17/23 0755   11/16/23 1345  nitrofurantoin (MACRODANTIN) capsule 50 mg        50 mg Oral  Once 11/16/23 1342 11/16/23 1455       Subjective: Seen and examined at bedside and laying in bed and had no complaints.  States that she had some fecal incontinence yesterday after eating the green beans with vinegar.  Denied any nausea or vomiting.  No other concerns requested at this time.  Feels relatively well otherwise.  Objective: Vitals:   11/23/23 0504 11/23/23 0833 11/23/23 0930 11/23/23 1157  BP: (!) 155/63  (!) 155/63 114/60  Pulse: (!) 59   62  Resp: 17   18  Temp: 97.7 F (36.5 C)   98.1 F (36.7 C)  TempSrc: Oral   Oral  SpO2: 100% 96%  100%  Weight:      Height:       Intake/Output Summary (Last 24 hours) at 11/23/2023 1535 Last data filed at 11/23/2023 1409 Gross per 24 hour  Intake 1134 ml  Output --  Net 1134 ml   Filed Weights   11/16/23 2314  Weight: 79 kg   Examination: Physical Exam:  Constitutional: WN/WD  obese Caucasian female in NAD Respiratory: Clear to auscultation bilaterally, no wheezing, rales, rhonchi or crackles. Normal respiratory effort and patient is not tachypenic. No accessory muscle use.  Cardiovascular: RRR, no murmurs / rubs / gallops. S1 and S2 auscultated. No extremity edema. 2+ pedal pulses. No carotid bruits.  Abdomen: Soft, non-tender, non-distended. No masses palpated. No appreciable hepatosplenomegaly. Bowel sounds positive.  GU: Deferred. Musculoskeletal: No clubbing / cyanosis of digits/nails. No joint deformity upper and lower extremities. Skin: No rashes, lesions, ulcers on a limited skin evaluation. No induration; Warm and dry.  Neurologic: CN 2-12 grossly intact with no focal deficits. Romberg sign and cerebellar reflexes not assessed.  Psychiatric: She is awake and alert   Data Reviewed: I have personally reviewed following labs and imaging studies  CBC: Recent Labs  Lab 11/20/23 0529 11/21/23 0540 11/22/23 0619 11/23/23 0514  WBC 8.9 8.4 9.5 8.5  NEUTROABS  --   --   --  5.1  HGB 13.9 13.9 13.8 13.4  HCT 41.7 41.1 43.3 41.7  MCV 87.2 88.0 89.3 91.0  PLT 226 215  204 205   Basic Metabolic Panel: Recent Labs  Lab 11/20/23 0529 11/21/23 0540 11/22/23 0619 11/23/23 0514  NA 139 138 140 138  K 3.7 4.2 4.1 4.0  CL 106 107 108 108  CO2 26 24 23 22   GLUCOSE 129* 139* 137* 200*  BUN 18 11 21 21   CREATININE 0.56 0.51 0.42* 0.39*  CALCIUM 9.4 9.3 9.5 9.2  MG 2.2 2.2 2.2 2.3  PHOS  --   --   --  3.9   GFR: Estimated Creatinine Clearance: 66.5 mL/min (A) (by C-G formula based on SCr of 0.39 mg/dL (L)). Liver Function Tests: Recent Labs  Lab 11/23/23 0514  AST 24  ALT 27  ALKPHOS 63  BILITOT 0.6  PROT 6.2*  ALBUMIN 3.5   No results for input(s): "LIPASE", "AMYLASE" in the last 168 hours. No results for input(s): "AMMONIA" in the last 168 hours. Coagulation Profile: No results for input(s): "INR", "PROTIME" in the last 168 hours. Cardiac  Enzymes: No results for input(s): "CKTOTAL", "CKMB", "CKMBINDEX", "TROPONINI" in the last 168 hours. BNP (last 3 results) No results for input(s): "PROBNP" in the last 8760 hours. HbA1C: No results for input(s): "HGBA1C" in the last 72 hours. CBG: Recent Labs  Lab 11/22/23 1144 11/22/23 1634 11/22/23 2024 11/23/23 0740 11/23/23 1154  GLUCAP 332* 135* 252* 190* 251*   Lipid Profile: No results for input(s): "CHOL", "HDL", "LDLCALC", "TRIG", "CHOLHDL", "LDLDIRECT" in the last 72 hours. Thyroid Function Tests: No results for input(s): "TSH", "T4TOTAL", "FREET4", "T3FREE", "THYROIDAB" in the last 72 hours. Anemia Panel: No results for input(s): "VITAMINB12", "FOLATE", "FERRITIN", "TIBC", "IRON", "RETICCTPCT" in the last 72 hours. Sepsis Labs: No results for input(s): "PROCALCITON", "LATICACIDVEN" in the last 168 hours.  Recent Results (from the past 240 hours)  Urine Culture     Status: Abnormal   Collection Time: 11/16/23 12:50 PM   Specimen: Urine, Clean Catch  Result Value Ref Range Status   Specimen Description   Final    URINE, CLEAN CATCH Performed at [facility], [address], [place], [place] [address]    Special Requests   Final    NONE Performed at [facility], [address], [place], [place] [address]    Culture (A)  Final    <10,000 COLONIES/mL INSIGNIFICANT GROWTH Performed at [facility], [address] [place], [place] [address]    Report Status 11/17/2023 FINAL  Final    Radiology Studies: No results found.  Scheduled Meds:  amLODipine  10 mg Oral Daily   donepezil  5 mg Oral QHS   enoxaparin (LOVENOX) injection  40 mg Subcutaneous Q24H   insulin aspart  0-15 Units Subcutaneous TID WC   insulin aspart  0-5 Units Subcutaneous QHS   insulin aspart  8 Units Subcutaneous TID WC   insulin glargine-yfgn  13 Units Subcutaneous QHS   losartan  100 mg Oral Daily   mometasone-formoterol  2 puff Inhalation BID    risperiDONE  0.5 mg Oral QHS   rosuvastatin  20 mg Oral Daily   sertraline  100 mg Oral Daily   Continuous Infusions:   LOS: 0 days   Marguerita Merles, DO [facility] Available via Epic secure chat 7am-7pm After these hours, please refer to coverage provider listed on [email or web address] 11/23/2023, 3:35 PM
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
## 2023-11-24 DIAGNOSIS — F323 Major depressive disorder, single episode, severe with psychotic features: Secondary | ICD-10-CM

## 2023-11-24 DIAGNOSIS — R45850 Homicidal ideations: Secondary | ICD-10-CM

## 2023-11-24 DIAGNOSIS — N3000 Acute cystitis without hematuria: Secondary | ICD-10-CM | POA: Diagnosis not present

## 2023-11-24 LAB — BASIC METABOLIC PANEL
Anion gap: 8 (ref 5–15)
BUN: 22 mg/dL (ref 8–23)
CO2: 23 mmol/L (ref 22–32)
Calcium: 9.1 mg/dL (ref 8.9–10.3)
Chloride: 108 mmol/L (ref 98–111)
Creatinine, Ser: 0.57 mg/dL (ref 0.44–1.00)
GFR, Estimated: >60 mL/min (ref >60)
Glucose, Bld: 125 mg/dL — ABNORMAL HIGH (ref 70–99)
Potassium: 4 mmol/L (ref 3.5–5.1)
Sodium: 139 mmol/L (ref 135–145)

## 2023-11-24 LAB — MAGNESIUM: Magnesium: 2.2 mg/dL (ref 1.7–2.4)

## 2023-11-24 LAB — CBC
HCT: 38.5 % (ref 36.0–46.0)
Hemoglobin: 12.6 g/dL (ref 12.0–15.0)
MCH: 29.3 pg (ref 26.0–34.0)
MCHC: 32.7 g/dL (ref 30.0–36.0)
MCV: 89.5 fL (ref 80.0–100.0)
Platelets: 210 10*3/uL (ref 150–400)
RBC: 4.3 MIL/uL (ref 3.87–5.11)
RDW: 13.2 % (ref 11.5–15.5)
WBC: 9 10*3/uL (ref 4.0–10.5)
nRBC: 0 % (ref 0.0–0.2)

## 2023-11-24 LAB — GLUCOSE, CAPILLARY
Glucose-Capillary: 108 mg/dL — ABNORMAL HIGH (ref 70–99)
Glucose-Capillary: 253 mg/dL — ABNORMAL HIGH (ref 70–99)
Glucose-Capillary: 203 mg/dL — ABNORMAL HIGH (ref 70–99)
Glucose-Capillary: 76 mg/dL (ref 70–99)

## 2023-11-24 LAB — SARS CORONAVIRUS 2 BY RT PCR: SARS Coronavirus 2 by RT PCR: NEGATIVE

## 2023-11-24 MED ORDER — LORATADINE 10 MG PO TABS: 10.0000 mg | ORAL_TABLET | Freq: Every day | ORAL | Status: DC | PRN

## 2023-11-24 MED ORDER — RISPERIDONE 0.5 MG PO TBDP: 0.5000 mg | ORAL_TABLET | Freq: Every day | ORAL | Status: DC

## 2023-11-24 MED ORDER — CARMEX CLASSIC LIP BALM EX OINT: TOPICAL_OINTMENT | CUTANEOUS | Status: DC | PRN

## 2023-11-24 MED ORDER — SERTRALINE HCL 100 MG PO TABS: 100.0000 mg | ORAL_TABLET | Freq: Every day | ORAL | Status: DC

## 2023-11-24 NOTE — Progress Notes (Signed)
PROGRESS NOTE    Natasha Chandler  XLK:[id number] DOB: 11/22/1956 DOA: 11/16/2023 PCP: Judd Lien, PA-C   Brief Narrative:  The patient is a 16 old obese Caucasian female, reportedly lives alone in an apartment complex, states that her sister lives nearby and intermittently comes stays with her, medical history significant for poorly controlled type II DM/IDDM, HTN, HLD, acute ischemic stroke 05/2023 with reported left-sided deficits, hypothyroidism, asthma, early dementia, vitamin D deficiency, brought in by EMS to ED on 11/16/2023 due to concern for altered mental status. As per EDP note, patient presented to the ED with confusion and neighbors called 911 because she was confused but also has history of being confused. She was reportedly knocking on neighbors door.  Patient has had aggressive behavior.  Upon admission AAO X4.  Concerns of possible acute cystitis started on Macrobid which was later discontinued.  Due to aggressive behavior, psychiatry was consulted.  She continued to have significant paranoid ideations.  Overall patient is medically doing better but clearly due to behavioral issues, she needs to be admitted inpatient psych and current bed availability still pending.  Assessment & Plan:  Principal Problem:   UTI (urinary tract infection) Active Problems:   Delusional disorder (HCC)   MDD (major depressive disorder), single episode, severe with psychotic features (HCC)   Homicidal ideations   Dementia with behavioral abnormalities -Worsening dementia with erratic behavior and concerns of safety.   -Patient had lack of medical decision making capacity and is involuntary committed.   -Psychiatry team has been consulted.   -Metabolic workup thus far is negative.   -MRI brain shows chronic changes.   -Continuing on Risperidone 0.25 mg nightly as well as Donepezil 5 mg p.o. nightly -Had MOCA testing yesterday and did poorly -Psychiatry consulted and recommending one-to-one  observation and recommending inpatient geropsych given that she has an increase in confabulation and perseveration; she continues to have paranoid delusions and psychiatry renewed her IVC and the current plan is to transfer to inpatient psychiatric facility once bed is available  Acute cystitis ruled out. Dysuria due to undetermined cause -Initially thought to be concerning infection and received Macrobid.   -Eventually insignificant colony/growth therefore antibiotics discontinued.   T2DM, poorly controlled -Currently on long-acting regimen with Semglee 13 units subcu nightly with moderate NovoLog/scale insulin before meals and at bedtime and insulin aspart has been increased to 10 units 3 times daily with meals -Will further adjust as necessary. -CBG trend: Recent Labs  Lab 11/22/23 2024 11/23/23 0740 11/23/23 1154 11/23/23 1700 11/23/23 2123 11/24/23 0807 11/24/23 1155  GLUCAP 252* 190* 251* 174* 214* 108* 253*    Essential HTN -C/w Home Amlodipine 10 mg po Daily and losartan 100 mg p.o. daily -Continue to monitor blood pressures per protocol -Last blood pressure reading was on the softer side at 105/75   HLD -Continue Rosuvastatin 20 mg po Daily    History of CVA -Chronic.  Rosuvastatin 20 mg po Daily    Depression and anxiety -Continue Sertraline 50 mg po Daily and Risperidone 0.25 mg p.o. nightly   Asthma -C/w Bronchodilators as needed with Albuterol 3 mL IH q6hprn Wheezing and SOB -Also continue Dulera 2 puffs IH twice daily   Class I Obesity -Complicates overall prognosis and care -Estimated body mass index is 31.85 kg/m as calculated from the following:   Height as of this encounter: 5\' 2"  (1.575 m).   Weight as of this encounter: 79 kg.  -Weight Loss and Dietary Counseling given  Medically cleared to be transferred to inpatient psych.  Her psychiatry treatment can continue at the facility.   DVT prophylaxis: enoxaparin (LOVENOX) injection 40 mg Start:  11/16/23 2200    Code Status: Full Code Family Communication: No family preset at bedside but Patient's aunt arrived as I was leaving the room  Disposition Plan:  Level of care: Med-Surg Status is: Observation The patient will require care spanning > 2 midnights and should be moved to inpatient because: Needs Inpatient Psychiatric Hospitalization   Consultants:  Psychiatry  Procedures:  As delineated as above  Antimicrobials:  Anti-infectives (From admission, onward)    Start     Dose/Rate Route Frequency Ordered Stop   11/17/23 1000  nitrofurantoin (macrocrystal-monohydrate) (MACROBID) capsule 100 mg  Status:  Discontinued        100 mg Oral Every 12 hours 11/17/23 0016 11/18/23 1211   11/17/23 0000  nitrofurantoin, macrocrystal-monohydrate, (MACROBID) 100 MG capsule  Status:  Discontinued        100 mg Oral 2 times daily 11/17/23 1046 11/18/23    11/16/23 2000  ciprofloxacin (CIPRO) IVPB 400 mg        400 mg 200 mL/hr over 60 Minutes Intravenous  Once 11/16/23 1958 11/17/23 0755   11/16/23 1345  nitrofurantoin (MACRODANTIN) capsule 50 mg        50 mg Oral  Once 11/16/23 1342 11/16/23 1455       Subjective: Seen and examined at bedside and laying in bed in no acute distress.  States that she is sleeping well but better.  No nausea or vomiting.  No other concerns or complaints at this time.  Objective: Vitals:   11/23/23 2114 11/24/23 0530 11/24/23 0819 11/24/23 1306  BP: (!) 124/58 (!) 131/54  105/75  Pulse: (!) 59 (!) 57  70  Resp:  16  17  Temp: 97.9 F (36.6 C) 98.4 F (36.9 C)  98.5 F (36.9 C)  TempSrc: Oral Oral    SpO2: 100% 100% 99% 99%  Weight:      Height:        Intake/Output Summary (Last 24 hours) at 11/24/2023 1340 Last data filed at 11/24/2023 1227 Gross per 24 hour  Intake 476 ml  Output --  Net 476 ml   Filed Weights   11/16/23 2314  Weight: 79 kg   Examination: Physical Exam:  Constitutional: WN/WD obese Caucasian female in  NAD Respiratory: Clear to auscultation bilaterally, no wheezing, rales, rhonchi or crackles. Normal respiratory effort and patient is not tachypenic. No accessory muscle use.  Cardiovascular: RRR, no murmurs / rubs / gallops. S1 and S2 auscultated. No extremity edema.  Abdomen: Soft, non-tender, non-distended. Musculoskeletal: No clubbing / cyanosis of digits/nails. No joint deformity upper and lower extremities. Skin: No rashes, lesions, ulcers on a limited skin evaluation. No induration; Warm and dry.  Neurologic: CN 2-12 grossly intact with no focal deficits. Romberg sign and cerebellar reflexes not assessed.  Psychiatric: Normal judgment and insight. Alert and oriented x 3. Normal mood and appropriate affect.   Data Reviewed: I have personally reviewed following labs and imaging studies  CBC: Recent Labs  Lab 11/20/23 0529 11/21/23 0540 11/22/23 0619 11/23/23 0514 11/24/23 0629  WBC 8.9 8.4 9.5 8.5 9.0  NEUTROABS  --   --   --  5.1  --   HGB 13.9 13.9 13.8 13.4 12.6  HCT 41.7 41.1 43.3 41.7 38.5  MCV 87.2 88.0 89.3 91.0 89.5  PLT 226 215 204 205 210  Basic Metabolic Panel: Recent Labs  Lab 11/20/23 0529 11/21/23 0540 11/22/23 0619 11/23/23 0514 11/24/23 0629  NA 139 138 140 138 139  K 3.7 4.2 4.1 4.0 4.0  CL 106 107 108 108 108  CO2 26 24 23 22 23   GLUCOSE 129* 139* 137* 200* 125*  BUN 18 11 21 21 22   CREATININE 0.56 0.51 0.42* 0.39* 0.57  CALCIUM 9.4 9.3 9.5 9.2 9.1  MG 2.2 2.2 2.2 2.3 2.2  PHOS  --   --   --  3.9  --    GFR: Estimated Creatinine Clearance: 66.5 mL/min (by C-G formula based on SCr of 0.57 mg/dL). Liver Function Tests: Recent Labs  Lab 11/23/23 0514  AST 24  ALT 27  ALKPHOS 63  BILITOT 0.6  PROT 6.2*  ALBUMIN 3.5   No results for input(s): "LIPASE", "AMYLASE" in the last 168 hours. No results for input(s): "AMMONIA" in the last 168 hours. Coagulation Profile: No results for input(s): "INR", "PROTIME" in the last 168 hours. Cardiac  Enzymes: No results for input(s): "CKTOTAL", "CKMB", "CKMBINDEX", "TROPONINI" in the last 168 hours. BNP (last 3 results) No results for input(s): "PROBNP" in the last 8760 hours. HbA1C: No results for input(s): "HGBA1C" in the last 72 hours. CBG: Recent Labs  Lab 11/23/23 1154 11/23/23 1700 11/23/23 2123 11/24/23 0807 11/24/23 1155  GLUCAP 251* 174* 214* 108* 253*   Lipid Profile: No results for input(s): "CHOL", "HDL", "LDLCALC", "TRIG", "CHOLHDL", "LDLDIRECT" in the last 72 hours. Thyroid Function Tests: No results for input(s): "TSH", "T4TOTAL", "FREET4", "T3FREE", "THYROIDAB" in the last 72 hours. Anemia Panel: No results for input(s): "VITAMINB12", "FOLATE", "FERRITIN", "TIBC", "IRON", "RETICCTPCT" in the last 72 hours. Sepsis Labs: No results for input(s): "PROCALCITON", "LATICACIDVEN" in the last 168 hours.  Recent Results (from the past 240 hours)  Urine Culture     Status: Abnormal   Collection Time: 11/16/23 12:50 PM   Specimen: Urine, Clean Catch  Result Value Ref Range Status   Specimen Description   Final    URINE, CLEAN CATCH Performed at [facility], [address], [place], [place] [address]    Special Requests   Final    NONE Performed at [facility], [address], [place], [place] [address]    Culture (A)  Final    <10,000 COLONIES/mL INSIGNIFICANT GROWTH Performed at [facility], [address], [place], [place] [address]    Report Status 11/17/2023 FINAL  Final    Radiology Studies: No results found.  Scheduled Meds:  amLODipine  10 mg Oral Daily   donepezil  5 mg Oral QHS   enoxaparin (LOVENOX) injection  40 mg Subcutaneous Q24H   insulin aspart  0-15 Units Subcutaneous TID WC   insulin aspart  0-5 Units Subcutaneous QHS   insulin aspart  10 Units Subcutaneous TID WC   insulin glargine-yfgn  13 Units Subcutaneous QHS   losartan  100 mg Oral Daily   mometasone-formoterol  2 puff Inhalation  BID   risperiDONE  0.5 mg Oral QHS   rosuvastatin  20 mg Oral Daily   sertraline  100 mg Oral Daily   Continuous Infusions:   LOS: 0 days   Marguerita Merles, DO [facility] Available via Epic secure chat 7am-7pm After these hours, please refer to coverage provider listed on [email or web address] 11/24/2023, 1:40 PM

## 2023-11-24 NOTE — Progress Notes (Signed)
Mobility Specialist - Progress Note   11/24/23 0913  Mobility  Activity Ambulated with assistance in hallway  Level of Assistance Independent after set-up  Assistive Device None  Distance Ambulated (ft) 1000 ft  Range of Motion/Exercises Active  Activity Response Tolerated well  Mobility Referral Yes  Mobility visit 1 Mobility  Mobility Specialist Start Time (ACUTE ONLY) 0843  Mobility Specialist Stop Time (ACUTE ONLY) 0853  Mobility Specialist Time Calculation (min) (ACUTE ONLY) 10 min   Received in bed and agreed to mobility, had no issues throughout session. Returned to bed with all needs met. Sitter in room.  Marilynne Halsted Mobility Specialist

## 2023-11-24 NOTE — Consult Note (Signed)
Patient with continued recommended for inpatient geropsych per attending, Gretta Cool, MD on 11/23/23.  Attending MD communicated recommendation with admissions coordinator, Malva Limes, who advised of bed availability at [facility] today.  Patient not seen by this writer due to pending transfer to inpatient unit as well as medical clearance and psychiatric appropriateness for transfer by primary and psychiatry consult teams' evaluations on 12/20.  No overnight reports of physical nor psychiatric decompensation.  Admission orders placed by this Clinical research associate.  Patient under IVC, renewed yesterday.   Lamar Sprinkles, MD PGY-3 11/24/2023  2:11 PM [facility]

## 2023-11-24 NOTE — TOC Transition Note (Addendum)
Transition of Care [facility]) - Discharge Note   Patient Details  Name: Natasha Chandler MRN: [id number] Date of Birth: 1956-08-26  [facility] CM/SW Contact:  Darleene Cleaver, LCSW Phone Number: 11/24/2023, 4:50 PM   Clinical Narrative:     Covid test came back negative.  Patient has been accepted by [facility].  Per Malva Limes [facility], they  can accept patient today.  IVC paperwork uploaded into EPIC.   CSW provided contact information for [facility] for IVC transport to [facility].  Patient will need EMTALA completed, CSW updated attending physician and bedside nurse.    Patient will be going to bed L32. Attending MD Marlou Porch. RN TO RN report to [phone].    Final next level of care: Psychiatric Hospital Barriers to Discharge: Barriers Resolved   Patient Goals and CMS Choice Patient states their goals for this hospitalization and ongoing recovery are:: Patient under IVC for [facility] CMS Medicare.gov Compare Post Acute Care list provided to:: Patient Represenative (must comment) (Adult sister) Choice offered to / list presented to : Sibling      Discharge Placement                       Discharge Plan and Services Additional resources added to the After Visit Summary for                                       Social Drivers of Health (SDOH) Interventions SDOH Screenings   Food Insecurity: No Food Insecurity (11/16/2023)  Housing: Low Risk  (11/16/2023)  Transportation Needs: No Transportation Needs (11/16/2023)  Utilities: Not At Risk (11/16/2023)  Alcohol Screen: Low Risk  (08/13/2020)  Depression (PHQ2-9): Medium Risk (08/13/2020)  Financial Resource Strain: Medium Risk (08/13/2020)  Physical Activity: Insufficiently Active (08/13/2020)  Social Connections: Unknown (08/13/2020)  Stress: Stress Concern Present (08/13/2020)  Tobacco Use: Low Risk  (11/16/2023)     Readmission Risk  Interventions     No data to display

## 2023-11-24 NOTE — Plan of Care (Signed)

## 2023-11-24 NOTE — Plan of Care (Signed)
  Problem: Coping: Goal: Ability to adjust to condition or change in health will improve Outcome: Progressing   Problem: Health Behavior/Discharge Planning: Goal: Ability to manage health-related needs will improve Outcome: Progressing   Problem: Clinical Measurements: Goal: Ability to maintain clinical measurements within normal limits will improve Outcome: Progressing Goal: Will remain free from infection Outcome: Progressing Goal: Diagnostic test results will improve Outcome: Progressing Goal: Respiratory complications will improve Outcome: Progressing Goal: Cardiovascular complication will be avoided Outcome: Progressing

## 2023-11-24 NOTE — Discharge Summary (Signed)
Physician Discharge Summary   Patient: Natasha Chandler MRN: [id number] DOB: 1955-12-09  Admit date:     11/16/2023  Discharge date: 11/24/23  Discharge Physician: Marguerita Merles, DO   PCP: Judd Lien, PA-C   Recommendations at discharge:   Further care per psychiatric team Patient will need PCP follow-up within 1 to 2 weeks repeat CBC, CMP, Mag, Phos within 1 week  Discharge Diagnoses: Principal Problem:   UTI (urinary tract infection) Active Problems:   Delusional disorder (HCC)   MDD (major depressive disorder), single episode, severe with psychotic features (HCC)   Homicidal ideations  Resolved Problems:   * No resolved hospital problems. [facility] Course: The patient is a 50 old obese Caucasian female, reportedly lives alone in an apartment complex, states that her sister lives nearby and intermittently comes stays with her, medical history significant for poorly controlled type II DM/IDDM, HTN, HLD, acute ischemic stroke 05/2023 with reported left-sided deficits, hypothyroidism, asthma, early dementia, vitamin D deficiency, brought in by EMS to ED on 11/16/2023 due to concern for altered mental status. As per EDP note, patient presented to the ED with confusion and neighbors called 911 because she was confused but also has history of being confused. She was reportedly knocking on neighbors door.  Patient has had aggressive behavior.  Upon admission AAO X4.  Concerns of possible acute cystitis started on Macrobid which was later discontinued.  Due to aggressive behavior, psychiatry was consulted.  She continued to have significant paranoid ideations.  Overall patient is medically doing better but clearly due to behavioral issues, she needs to be admitted inpatient psych and bed is now available today and she is stable for discharge.  Assessment & Plan:  Principal Problem:   UTI (urinary tract infection) Active Problems:   Delusional disorder (HCC)   MDD (major depressive  disorder), single episode, severe with psychotic features (HCC)   Homicidal ideations   Dementia with behavioral abnormalities -Worsening dementia with erratic behavior and concerns of safety.   -Patient had lack of medical decision making capacity and is involuntary committed.   -Psychiatry team has been consulted.   -Metabolic workup thus far is negative.   -MRI brain shows chronic changes.   -Continuing on Risperidone 0.25 mg nightly as well as Donepezil 5 mg p.o. nightly -Had MOCA testing yesterday and did poorly -Psychiatry consulted and recommending one-to-one observation and recommending inpatient geropsych given that she has an increase in confabulation and perseveration; she continues to have paranoid delusions and psychiatry renewed her IVC and the current plan is to transfer to inpatient psychiatric facility once bed is available  Acute cystitis ruled out. Dysuria due to undetermined cause -Initially thought to be concerning infection and received Macrobid.   -Eventually insignificant colony/growth therefore antibiotics discontinued.   T2DM, poorly controlled -Currently on long-acting regimen with Semglee 13 units subcu nightly with moderate NovoLog/scale insulin before meals and at bedtime and insulin aspart has been increased to 10 units 3 times daily with meals -Will further adjust as necessary. -CBG trend: Recent Labs  Lab 11/22/23 2024 11/23/23 0740 11/23/23 1154 11/23/23 1700 11/23/23 2123 11/24/23 0807 11/24/23 1155  GLUCAP 252* 190* 251* 174* 214* 108* 253*    Essential HTN -C/w Home Amlodipine 10 mg po Daily and losartan 100 mg p.o. daily -Continue to monitor blood pressures per protocol -Last blood pressure reading was on the softer side at 105/75   HLD -Continue Rosuvastatin 20 mg po Daily    History of CVA -Chronic.  Rosuvastatin 20 mg po Daily    Depression and anxiety -Continue Sertraline 50 mg po Daily and Risperidone 0.25 mg p.o. nightly    Asthma -C/w Bronchodilators as needed with Albuterol 3 mL IH q6hprn Wheezing and SOB -Also continue Dulera 2 puffs IH twice daily   Class I Obesity -Complicates overall prognosis and care -Estimated body mass index is 31.85 kg/m as calculated from the following:   Height as of this encounter: 5\' 2"  (1.575 m).   Weight as of this encounter: 79 kg.  -Weight Loss and Dietary Counseling given   Medically cleared to be transferred to inpatient psych.  Her psychiatry treatment can continue at the facility.  Consultants: Psychiatry Procedures performed: As delineated as above Disposition:  Inpatient psychiatric facility Diet recommendation:  Discharge Diet Orders (From admission, onward)     Start     Ordered   11/17/23 0000  Diet - low sodium heart healthy        11/17/23 1046   11/17/23 0000  Diet Carb Modified        11/17/23 1046           Carb modified diet DISCHARGE MEDICATION: Allergies as of 11/24/2023       Reactions   Bee Venom Anaphylaxis   Olive Oil Anaphylaxis, Swelling, Other (See Comments)   Throat swelling; includes olives   Oysters [shellfish Allergy] Anaphylaxis, Swelling, Other (See Comments)   Throat swelling   Penicillins Anaphylaxis, Rash   Sulfa Antibiotics Diarrhea   Sulfasalazine Diarrhea   Tramadol Other (See Comments)   Low B/P        Medication List     TAKE these medications    albuterol 108 (90 Base) MCG/ACT inhaler Commonly known as: VENTOLIN HFA Inhale 1-2 puffs into the lungs every 6 (six) hours as needed for wheezing or shortness of breath.   amLODipine 10 MG tablet Commonly known as: NORVASC Take 10 mg by mouth in the morning.   aspirin 81 MG chewable tablet Chew 1 tablet (81 mg total) by mouth daily.   diclofenac 50 MG EC tablet Commonly known as: VOLTAREN Take 50 mg by mouth 2 (two) times daily.   donepezil 5 MG tablet Commonly known as: ARICEPT Take 5 mg by mouth at bedtime.   fluticasone 50 MCG/ACT nasal  spray Commonly known as: FLONASE Place 2 sprays into both nostrils daily as needed for allergies.   hydrochlorothiazide 12.5 MG tablet Commonly known as: HYDRODIURIL Take 12.5 mg by mouth daily.   Insulin Aspart FlexPen 100 UNIT/ML Commonly known as: NOVOLOG Inject 3 Units into the skin 3 (three) times daily with meals.   insulin degludec 100 UNIT/ML FlexTouch Pen Commonly known as: TRESIBA Inject 10 Units into the skin daily.   Insulin Pen Needle 31G X 5 MM Misc Use to inject insulin once daily   levothyroxine 50 MCG tablet Commonly known as: SYNTHROID Take 50 mcg by mouth daily before breakfast.   lip balm ointment Apply topically as needed.   loratadine 10 MG tablet Commonly known as: CLARITIN Take 1 tablet (10 mg total) by mouth daily as needed for allergies.   losartan 100 MG tablet Commonly known as: COZAAR Take 1 tablet (100 mg total) by mouth daily. What changed: when to take this   Ozempic (2 MG/DOSE) 8 MG/3ML Sopn Generic drug: Semaglutide (2 MG/DOSE) Inject 0.75 mLs (2 mg total) into the skin every 7 days. What changed: when to take this   pioglitazone 45 MG tablet Commonly known  as: ACTOS Take 45 mg by mouth daily.   risperiDONE 0.5 MG disintegrating tablet Commonly known as: RISPERDAL M-TABS Take 1 tablet (0.5 mg total) by mouth at bedtime.   rosuvastatin 20 MG tablet Commonly known as: CRESTOR Take 20 mg by mouth at bedtime.   sertraline 100 MG tablet Commonly known as: ZOLOFT Take 1 tablet (100 mg total) by mouth daily. Start taking on: November 25, 2023 What changed:  medication strength how much to take   Vitamin D (Ergocalciferol) 1.25 MG (50000 UNIT) Caps capsule Commonly known as: DRISDOL Take 50,000 Units by mouth every Sunday. Thursdays        Follow-up Information     [facility] Follow up.   Specialty: Home Health Services Why: A representative with [facility] will contact you within 24-48 hours from  leaving the emergency room regarding your home health physical therapy, occupational therapy and social work services. Contact information: [address] [place] [address] [phone]         Judd Lien, PA-C. Schedule an appointment as soon as possible for a visit in 1 week(s).   Specialty: Physician Assistant Why: To be seen with repeat labs (CBC & BMP).  Family physician to follow-up urine culture results that were sent from the hospital and adjust antibiotics as needed. Contact information: [address] [place] [address] [phone]                Discharge Exam: Filed Weights   11/16/23 2314  Weight: 79 kg   Vitals:   11/24/23 0819 11/24/23 1306  BP:  105/75  Pulse:  70  Resp:  17  Temp:  98.5 F (36.9 C)  SpO2: 99% 99%   Examination: Physical Exam:   Constitutional: WN/WD obese Caucasian female in NAD Respiratory: Clear to auscultation bilaterally, no wheezing, rales, rhonchi or crackles. Normal respiratory effort and patient is not tachypenic. No accessory muscle use.  Cardiovascular: RRR, no murmurs / rubs / gallops. S1 and S2 auscultated. No extremity edema.  Abdomen: Soft, non-tender, non-distended. Musculoskeletal: No clubbing / cyanosis of digits/nails. No joint deformity upper and lower extremities. Skin: No rashes, lesions, ulcers on a limited skin evaluation. No induration; Warm and dry.  Neurologic: CN 2-12 grossly intact with no focal deficits. Romberg sign and cerebellar reflexes not assessed.  Psychiatric: Normal judgment and insight. Alert and oriented x 3. Normal mood and appropriate affect.   Condition at discharge: stable  The results of significant diagnostics from this hospitalization (including imaging, microbiology, ancillary and laboratory) are listed below for reference.   Imaging Studies: MR BRAIN WO CONTRAST Result Date: 11/16/2023 CLINICAL DATA:  Initial evaluation for mental status  change, unknown cause. EXAM: MRI HEAD WITHOUT CONTRAST TECHNIQUE: Multiplanar, multiecho pulse sequences of the brain and surrounding structures were obtained without intravenous contrast. COMPARISON:  Prior study from 05/11/2023. FINDINGS: Brain: Cerebral volume within normal limits. Extensive patchy and confluent T2/FLAIR signal abnormality involving the supratentorial cerebral white matter, consistent with advanced chronic microvascular ischemic disease. No evidence for acute or subacute ischemia. Gray-white matter differentiation maintained. No acute intracranial hemorrhage. Innumerable chronic micro hemorrhages again noted, most pronounced posteriorly. No mass lesion, midline shift or mass effect. No hydrocephalus or extra-axial fluid collection. Pituitary gland suprasellar region within normal limits. Vascular: Major intracranial vascular flow voids are maintained. Skull and upper cervical spine: Craniocervical junction within normal limits. Bone marrow signal intensity normal. No scalp soft tissue abnormality. Sinuses/Orbits: Globes orbital  soft tissues within normal limits. Paranasal sinuses are largely clear. No significant mastoid effusion. Other: None. IMPRESSION: 1. No acute intracranial abnormality. 2. Advanced chronic microvascular ischemic disease. 3. Innumerable chronic micro hemorrhages, most pronounced posteriorly. Findings could be related to chronic poorly controlled hypertension or possibly cerebral amyloid angiopathy. Electronically Signed   By: Rise Mu M.D.   On: 11/16/2023 19:03    Microbiology: Results for orders placed or performed during the hospital encounter of 11/16/23  Urine Culture     Status: Abnormal   Collection Time: 11/16/23 12:50 PM   Specimen: Urine, Clean Catch  Result Value Ref Range Status   Specimen Description   Final    URINE, CLEAN CATCH Performed at [facility], [address], [place], [place] [address]    Special Requests    Final    NONE Performed at [facility], [address], [place], [place] [address]    Culture (A)  Final    <10,000 COLONIES/mL INSIGNIFICANT GROWTH Performed at [facility], [address] [place], [place] [address]    Report Status 11/17/2023 FINAL  Final   Labs: CBC: Recent Labs  Lab 11/20/23 0529 11/21/23 0540 11/22/23 0619 11/23/23 0514 11/24/23 0629  WBC 8.9 8.4 9.5 8.5 9.0  NEUTROABS  --   --   --  5.1  --   HGB 13.9 13.9 13.8 13.4 12.6  HCT 41.7 41.1 43.3 41.7 38.5  MCV 87.2 88.0 89.3 91.0 89.5  PLT 226 215 204 205 210   Basic Metabolic Panel: Recent Labs  Lab 11/20/23 0529 11/21/23 0540 11/22/23 0619 11/23/23 0514 11/24/23 0629  NA 139 138 140 138 139  K 3.7 4.2 4.1 4.0 4.0  CL 106 107 108 108 108  CO2 26 24 23 22 23   GLUCOSE 129* 139* 137* 200* 125*  BUN 18 11 21 21 22   CREATININE 0.56 0.51 0.42* 0.39* 0.57  CALCIUM 9.4 9.3 9.5 9.2 9.1  MG 2.2 2.2 2.2 2.3 2.2  PHOS  --   --   --  3.9  --    Liver Function Tests: Recent Labs  Lab 11/23/23 0514  AST 24  ALT 27  ALKPHOS 63  BILITOT 0.6  PROT 6.2*  ALBUMIN 3.5   CBG: Recent Labs  Lab 11/23/23 1154 11/23/23 1700 11/23/23 2123 11/24/23 0807 11/24/23 1155  GLUCAP 251* 174* 214* 108* 253*   Discharge time spent: greater than 30 minutes.  Signed: Marguerita Merles, DO [facility] 11/24/2023

## 2023-11-24 NOTE — Progress Notes (Addendum)
Attempted to call report to [facility] geri-psych unit [phone]), however the staff is requesting the report be called to the shift that is accepting the patient. [facility] transportation department [phone]) contacted and voicemail left. No ETA for transfer known at this time.

## 2023-11-25 ENCOUNTER — Encounter: Payer: Self-pay | Admitting: Student

## 2023-11-25 ENCOUNTER — Other Ambulatory Visit: Payer: Self-pay

## 2023-11-25 ENCOUNTER — Inpatient Hospital Stay
Admission: RE | Admit: 2023-11-25 | Discharge: 2024-01-11 | DRG: 885 | Disposition: A | Discharge status: Nursing Home (Includes ICF) | Payer: Medicare HMO | Source: Intra-hospital | Attending: Psychiatry | Admitting: Psychiatry

## 2023-11-25 DIAGNOSIS — Z885 Allergy status to narcotic agent status: Secondary | ICD-10-CM

## 2023-11-25 DIAGNOSIS — Z5986 Financial insecurity: Secondary | ICD-10-CM

## 2023-11-25 DIAGNOSIS — F333 Major depressive disorder, recurrent, severe with psychotic symptoms: Principal | ICD-10-CM | POA: Diagnosis present

## 2023-11-25 DIAGNOSIS — Z7982 Long term (current) use of aspirin: Secondary | ICD-10-CM

## 2023-11-25 DIAGNOSIS — F03A2 Unspecified dementia, mild, with psychotic disturbance: Secondary | ICD-10-CM | POA: Diagnosis present

## 2023-11-25 DIAGNOSIS — Z794 Long term (current) use of insulin: Secondary | ICD-10-CM | POA: Diagnosis not present

## 2023-11-25 DIAGNOSIS — Z833 Family history of diabetes mellitus: Secondary | ICD-10-CM

## 2023-11-25 DIAGNOSIS — Z803 Family history of malignant neoplasm of breast: Secondary | ICD-10-CM | POA: Diagnosis not present

## 2023-11-25 DIAGNOSIS — Z7985 Long-term (current) use of injectable non-insulin antidiabetic drugs: Secondary | ICD-10-CM | POA: Diagnosis not present

## 2023-11-25 DIAGNOSIS — F03A3 Unspecified dementia, mild, with mood disturbance: Secondary | ICD-10-CM | POA: Diagnosis present

## 2023-11-25 DIAGNOSIS — R45850 Homicidal ideations: Secondary | ICD-10-CM | POA: Diagnosis not present

## 2023-11-25 DIAGNOSIS — E559 Vitamin D deficiency, unspecified: Secondary | ICD-10-CM | POA: Diagnosis present

## 2023-11-25 DIAGNOSIS — Z5982 Transportation insecurity: Secondary | ICD-10-CM

## 2023-11-25 DIAGNOSIS — Z23 Encounter for immunization: Secondary | ICD-10-CM

## 2023-11-25 DIAGNOSIS — Z79899 Other long term (current) drug therapy: Secondary | ICD-10-CM | POA: Diagnosis not present

## 2023-11-25 DIAGNOSIS — Z882 Allergy status to sulfonamides status: Secondary | ICD-10-CM

## 2023-11-25 DIAGNOSIS — Z880 Allergy status to penicillin: Secondary | ICD-10-CM

## 2023-11-25 DIAGNOSIS — E785 Hyperlipidemia, unspecified: Secondary | ICD-10-CM | POA: Diagnosis present

## 2023-11-25 DIAGNOSIS — Z818 Family history of other mental and behavioral disorders: Secondary | ICD-10-CM | POA: Diagnosis not present

## 2023-11-25 DIAGNOSIS — Z820 Family history of epilepsy and other diseases of the nervous system: Secondary | ICD-10-CM | POA: Diagnosis not present

## 2023-11-25 DIAGNOSIS — F03A4 Unspecified dementia, mild, with anxiety: Secondary | ICD-10-CM | POA: Diagnosis present

## 2023-11-25 DIAGNOSIS — R32 Unspecified urinary incontinence: Secondary | ICD-10-CM | POA: Diagnosis not present

## 2023-11-25 DIAGNOSIS — N3000 Acute cystitis without hematuria: Secondary | ICD-10-CM | POA: Diagnosis not present

## 2023-11-25 DIAGNOSIS — Z825 Family history of asthma and other chronic lower respiratory diseases: Secondary | ICD-10-CM

## 2023-11-25 DIAGNOSIS — E1165 Type 2 diabetes mellitus with hyperglycemia: Secondary | ICD-10-CM | POA: Diagnosis present

## 2023-11-25 DIAGNOSIS — Z91013 Allergy to seafood: Secondary | ICD-10-CM

## 2023-11-25 DIAGNOSIS — J45909 Unspecified asthma, uncomplicated: Secondary | ICD-10-CM | POA: Diagnosis present

## 2023-11-25 DIAGNOSIS — Z7984 Long term (current) use of oral hypoglycemic drugs: Secondary | ICD-10-CM | POA: Diagnosis not present

## 2023-11-25 DIAGNOSIS — E039 Hypothyroidism, unspecified: Secondary | ICD-10-CM | POA: Diagnosis present

## 2023-11-25 DIAGNOSIS — Z79890 Hormone replacement therapy (postmenopausal): Secondary | ICD-10-CM | POA: Diagnosis not present

## 2023-11-25 DIAGNOSIS — I10 Essential (primary) hypertension: Secondary | ICD-10-CM | POA: Diagnosis present

## 2023-11-25 DIAGNOSIS — Z8249 Family history of ischemic heart disease and other diseases of the circulatory system: Secondary | ICD-10-CM

## 2023-11-25 DIAGNOSIS — Z91030 Bee allergy status: Secondary | ICD-10-CM

## 2023-11-25 DIAGNOSIS — Z91018 Allergy to other foods: Secondary | ICD-10-CM

## 2023-11-25 DIAGNOSIS — Z8673 Personal history of transient ischemic attack (TIA), and cerebral infarction without residual deficits: Secondary | ICD-10-CM

## 2023-11-25 DIAGNOSIS — Z823 Family history of stroke: Secondary | ICD-10-CM

## 2023-11-25 DIAGNOSIS — F323 Major depressive disorder, single episode, severe with psychotic features: Secondary | ICD-10-CM | POA: Diagnosis not present

## 2023-11-25 DIAGNOSIS — N390 Urinary tract infection, site not specified: Secondary | ICD-10-CM | POA: Diagnosis not present

## 2023-11-25 LAB — GLUCOSE, CAPILLARY
Glucose-Capillary: 95 mg/dL (ref 70–99)
Glucose-Capillary: 214 mg/dL — ABNORMAL HIGH (ref 70–99)
Glucose-Capillary: 130 mg/dL — ABNORMAL HIGH (ref 70–99)
Glucose-Capillary: 194 mg/dL — ABNORMAL HIGH (ref 70–99)

## 2023-11-25 MED ORDER — ONDANSETRON HCL 4 MG PO TABS: 4.0000 mg | ORAL_TABLET | Freq: Four times a day (QID) | ORAL | Status: DC | PRN

## 2023-11-25 MED ORDER — INSULIN ASPART 100 UNIT/ML IJ SOLN
0.0000 [IU] | Freq: Three times a day (TID) | INTRAMUSCULAR | Status: DC
  Administered 2023-11-25: 5 [IU] via SUBCUTANEOUS
  Administered 2023-11-25: 2 [IU] via SUBCUTANEOUS
  Administered 2023-11-26: 8 [IU] via SUBCUTANEOUS
  Administered 2023-11-26: 5 [IU] via SUBCUTANEOUS
  Administered 2023-11-26: 2 [IU] via SUBCUTANEOUS
  Administered 2023-11-27: 3 [IU] via SUBCUTANEOUS
  Administered 2023-11-27: 8 [IU] via SUBCUTANEOUS
  Administered 2023-11-28: 3 [IU] via SUBCUTANEOUS
  Administered 2023-11-28: 2 [IU] via SUBCUTANEOUS
  Administered 2023-11-28: 8 [IU] via SUBCUTANEOUS
  Administered 2023-11-29: 5 [IU] via SUBCUTANEOUS
  Administered 2023-11-30 (×2): 2 [IU] via SUBCUTANEOUS
  Administered 2023-11-30: 3 [IU] via SUBCUTANEOUS
  Administered 2023-12-01: 2 [IU] via SUBCUTANEOUS
  Administered 2023-12-01: 3 [IU] via SUBCUTANEOUS
  Administered 2023-12-02: 2 [IU] via SUBCUTANEOUS
  Administered 2023-12-02: 3 [IU] via SUBCUTANEOUS
  Administered 2023-12-02 – 2023-12-04 (×3): 2 [IU] via SUBCUTANEOUS
  Filled 2023-11-25 (×6): qty 1

## 2023-11-25 MED ORDER — MAGNESIUM HYDROXIDE 400 MG/5ML PO SUSP: 30.0000 mL | Freq: Every day | ORAL | Status: DC | PRN

## 2023-11-25 MED ORDER — LOSARTAN POTASSIUM 25 MG PO TABS
100.0000 mg | ORAL_TABLET | Freq: Every day | ORAL | Status: DC
  Administered 2023-11-26 – 2024-01-09 (×38): 100 mg via ORAL
  Filled 2023-11-25 (×45): qty 4

## 2023-11-25 MED ORDER — ONDANSETRON HCL 4 MG/2ML IJ SOLN: 4.0000 mg | Freq: Four times a day (QID) | INTRAMUSCULAR | Status: DC | PRN

## 2023-11-25 MED ORDER — DONEPEZIL HCL 5 MG PO TABS
5.0000 mg | ORAL_TABLET | Freq: Every day | ORAL | Status: DC
  Administered 2023-11-25 – 2023-12-23 (×29): 5 mg via ORAL
  Filled 2023-11-25 (×30): qty 1

## 2023-11-25 MED ORDER — ASPIRIN 81 MG PO TBEC
81.0000 mg | DELAYED_RELEASE_TABLET | Freq: Every day | ORAL | Status: DC
  Administered 2023-11-25 – 2024-01-11 (×48): 81 mg via ORAL
  Filled 2023-11-25 (×49): qty 1

## 2023-11-25 MED ORDER — MOMETASONE FURO-FORMOTEROL FUM 200-5 MCG/ACT IN AERO
2.0000 | INHALATION_SPRAY | Freq: Two times a day (BID) | RESPIRATORY_TRACT | Status: DC
  Administered 2023-11-25 – 2024-01-11 (×92): 2 via RESPIRATORY_TRACT
  Filled 2023-11-25 (×3): qty 8.8

## 2023-11-25 MED ORDER — LORATADINE 10 MG PO TABS: 10.0000 mg | ORAL_TABLET | Freq: Every day | ORAL | Status: DC | PRN

## 2023-11-25 MED ORDER — PNEUMOCOCCAL 20-VAL CONJ VACC 0.5 ML IM SUSY
0.5000 mL | PREFILLED_SYRINGE | INTRAMUSCULAR | Status: AC
Start: 2023-11-26 — End: 2023-11-26
  Administered 2023-11-26: 0.5 mL via INTRAMUSCULAR
  Filled 2023-11-25: qty 0.5

## 2023-11-25 MED ORDER — LEVOTHYROXINE SODIUM 50 MCG PO TABS
50.0000 ug | ORAL_TABLET | Freq: Every day | ORAL | Status: DC
Start: 2023-11-26 — End: 2024-01-11
  Administered 2023-11-27 – 2024-01-11 (×46): 50 ug via ORAL
  Filled 2023-11-25 (×49): qty 1

## 2023-11-25 MED ORDER — SERTRALINE HCL 50 MG PO TABS
100.0000 mg | ORAL_TABLET | Freq: Every day | ORAL | Status: DC
  Administered 2023-11-26 – 2024-01-11 (×47): 100 mg via ORAL
  Filled 2023-11-25 (×47): qty 2

## 2023-11-25 MED ORDER — HYDROCHLOROTHIAZIDE 12.5 MG PO TABS
12.5000 mg | ORAL_TABLET | Freq: Every day | ORAL | Status: DC
  Administered 2023-11-25 – 2024-01-11 (×38): 12.5 mg via ORAL
  Filled 2023-11-25 (×50): qty 1

## 2023-11-25 MED ORDER — ACETAMINOPHEN 325 MG PO TABS: 650.0000 mg | ORAL_TABLET | Freq: Four times a day (QID) | ORAL | Status: DC | PRN

## 2023-11-25 MED ORDER — SENNOSIDES-DOCUSATE SODIUM 8.6-50 MG PO TABS: 1.0000 | ORAL_TABLET | Freq: Every evening | ORAL | Status: DC | PRN

## 2023-11-25 MED ORDER — RISPERIDONE 1 MG PO TBDP
0.5000 mg | ORAL_TABLET | Freq: Every day | ORAL | Status: DC
  Administered 2023-11-25: 0.5 mg via ORAL
  Filled 2023-11-25: qty 0.5

## 2023-11-25 MED ORDER — ALBUTEROL SULFATE (2.5 MG/3ML) 0.083% IN NEBU: 3.0000 mL | INHALATION_SOLUTION | Freq: Four times a day (QID) | RESPIRATORY_TRACT | Status: DC | PRN

## 2023-11-25 MED ORDER — INSULIN ASPART 100 UNIT/ML IJ SOLN
10.0000 [IU] | Freq: Three times a day (TID) | INTRAMUSCULAR | Status: DC
  Administered 2023-11-25 – 2023-12-04 (×25): 10 [IU] via SUBCUTANEOUS
  Filled 2023-11-25 (×10): qty 1

## 2023-11-25 MED ORDER — INSULIN GLARGINE-YFGN 100 UNIT/ML ~~LOC~~ SOLN
13.0000 [IU] | Freq: Every day | SUBCUTANEOUS | Status: DC
  Administered 2023-11-25 – 2023-12-03 (×8): 13 [IU] via SUBCUTANEOUS
  Filled 2023-11-25 (×10): qty 0.13

## 2023-11-25 MED ORDER — ALUM & MAG HYDROXIDE-SIMETH 200-200-20 MG/5ML PO SUSP: 30.0000 mL | ORAL | Status: DC | PRN

## 2023-11-25 MED ORDER — OLANZAPINE 5 MG PO TBDP
5.0000 mg | ORAL_TABLET | Freq: Three times a day (TID) | ORAL | Status: DC | PRN
  Administered 2023-12-20 – 2023-12-30 (×4): 5 mg via ORAL
  Filled 2023-11-25 (×3): qty 1

## 2023-11-25 MED ORDER — AMLODIPINE BESYLATE 5 MG PO TABS
10.0000 mg | ORAL_TABLET | Freq: Every day | ORAL | Status: DC
  Administered 2023-11-26 – 2024-01-09 (×32): 10 mg via ORAL
  Filled 2023-11-25 (×45): qty 2

## 2023-11-25 MED ORDER — FLUTICASONE PROPIONATE 50 MCG/ACT NA SUSP: 2.0000 | Freq: Every day | NASAL | Status: DC | PRN

## 2023-11-25 MED ORDER — ACETAMINOPHEN 325 MG PO TABS
650.0000 mg | ORAL_TABLET | Freq: Four times a day (QID) | ORAL | Status: DC | PRN
Start: 2023-11-25 — End: 2023-11-25

## 2023-11-25 MED ORDER — ROSUVASTATIN CALCIUM 10 MG PO TABS
20.0000 mg | ORAL_TABLET | Freq: Every day | ORAL | Status: DC
  Administered 2023-11-26 – 2024-01-11 (×47): 20 mg via ORAL
  Filled 2023-11-25 (×47): qty 2

## 2023-11-25 MED ORDER — ACETAMINOPHEN 650 MG RE SUPP
650.0000 mg | Freq: Four times a day (QID) | RECTAL | Status: DC | PRN
  Filled 2023-11-25: qty 1

## 2023-11-25 MED ORDER — INSULIN ASPART 100 UNIT/ML IJ SOLN
0.0000 [IU] | Freq: Every day | INTRAMUSCULAR | Status: DC
  Administered 2023-11-25: 3 [IU] via SUBCUTANEOUS
  Administered 2023-11-26 – 2023-12-03 (×3): 2 [IU] via SUBCUTANEOUS
  Filled 2023-11-25 (×4): qty 1

## 2023-11-25 NOTE — Tx Team (Signed)
Initial Treatment Plan 11/25/2023 10:37 AM Hyman Bible BJY:[id number]    PATIENT STRESSORS: Health problems   Marital or family conflict     PATIENT STRENGTHS: Ability for insight  Capable of independent living  Motivation for treatment/growth  Supportive family/friends    PATIENT IDENTIFIED PROBLEMS:   "I don't know why I am here. I did not do anything to anyone."    "My sister planned this well. She is evil."               DISCHARGE CRITERIA:  Ability to meet basic life and health needs Adequate post-discharge living arrangements Improved stabilization in mood, thinking, and/or behavior  PRELIMINARY DISCHARGE PLAN: Return to previous living arrangement  PATIENT/FAMILY INVOLVEMENT: This treatment plan has been presented to and reviewed with the patient, SAIDIE LAYER. The patient has been given the opportunity to ask questions and make suggestions.   Luane School, RN 11/25/2023, 10:37 AM

## 2023-11-25 NOTE — Progress Notes (Signed)
 Admission Note:  Natasha Chandler was admitted under IVC from [facility].  Patient states she does not know why she was brought to the hospital. She does report she had a disagreement with her sister (her sister used to live with her).   Sad affect. Endorses anxiety, worrying and depression.   Denies SI/HI and AVH.  Denies pain.  Cooperative with admission process.    Patient reports she lives alone and has her own home.  Her aunt is her only source of support.  Patient is wearing eyeglasses.  No dentures or hearing aids.  Patient does not use any assistive devices for ambulation.  Denies alcohol consumption, smoking or drug use.    Patient oriented to unit.  Present in the milieu.  Appears tearful and nervous.  Questions and concerns answered an addressed.
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
## 2023-11-25 NOTE — Progress Notes (Signed)
[place] [facility] called to inform they will be here to transport pt to [facility] around [phone] this morning.   Report called to [place], [place]

## 2023-11-25 NOTE — Progress Notes (Signed)
Mobility Specialist - Progress Note   11/25/23 0929  Mobility  Activity Ambulated with assistance in hallway  Level of Assistance Modified independent, requires aide device or extra time  Assistive Device None  Distance Ambulated (ft) 500 ft  Range of Motion/Exercises Active  Activity Response Tolerated well  Mobility Referral Yes  Mobility visit 1 Mobility  Mobility Specialist Start Time (ACUTE ONLY) 0845  Mobility Specialist Stop Time (ACUTE ONLY) 0853  Mobility Specialist Time Calculation (min) (ACUTE ONLY) 8 min   Received in bed and agreed to mobility, had no issues throughout session and returned to room with all needs met.  Marilynne Halsted Mobility Specialist

## 2023-11-25 NOTE — Discharge Summary (Signed)
Physician Discharge Summary   Patient: Natasha Chandler MRN: [id number] DOB: 09/08/1956  Admit date:     11/16/2023  Discharge date: 11/24/23  Discharge Physician: Marguerita Merles, DO   PCP: Judd Lien, PA-C   Recommendations at discharge:   Further care per psychiatric team Patient will need PCP follow-up within 1 to 2 weeks repeat CBC, CMP, Mag, Phos within 1 week  Discharge Diagnoses: Principal Problem:   UTI (urinary tract infection) Active Problems:   Delusional disorder (HCC)   MDD (major depressive disorder), single episode, severe with psychotic features (HCC)   Homicidal ideations  Resolved Problems:   * No resolved hospital problems. [facility] Course: The patient is a 70 old obese Caucasian female, reportedly lives alone in an apartment complex, states that her sister lives nearby and intermittently comes stays with her, medical history significant for poorly controlled type II DM/IDDM, HTN, HLD, acute ischemic stroke 05/2023 with reported left-sided deficits, hypothyroidism, asthma, early dementia, vitamin D deficiency, brought in by EMS to ED on 11/16/2023 due to concern for altered mental status. As per EDP note, patient presented to the ED with confusion and neighbors called 911 because she was confused but also has history of being confused. She was reportedly knocking on neighbors door.  Patient has had aggressive behavior.  Upon admission AAO X4.  Concerns of possible acute cystitis started on Macrobid which was later discontinued.  Due to aggressive behavior, psychiatry was consulted.  She continued to have significant paranoid ideations.  Overall patient is medically doing better but clearly due to behavioral issues, she needs to be admitted inpatient psych and bed is now available today and she is stable for discharge.  ADDENDUM 11/25/23: Patient was not seen or examined this morning as she left the hospital before I could do so but she remained medically stable for  discharge as she was discharged on 11/24/23.  Transportation could not be arranged for her last night so that is why she stayed overnight. No acute issues overnight per nursing staff and the sheriff came to pick her up and she is going to [facility] to the [place] psych unit today.   Assessment & Plan:  Principal Problem:   UTI (urinary tract infection) Active Problems:   Delusional disorder (HCC)   MDD (major depressive disorder), single episode, severe with psychotic features (HCC)   Homicidal ideations   Dementia with behavioral abnormalities -Worsening dementia with erratic behavior and concerns of safety.   -Patient had lack of medical decision making capacity and is involuntary committed.   -Psychiatry team has been consulted.   -Metabolic workup thus far is negative.   -MRI brain shows chronic changes.   -Continuing on Risperidone 0.25 mg nightly as well as Donepezil 5 mg p.o. nightly -Had MOCA testing yesterday and did poorly -Psychiatry consulted and recommending one-to-one observation and recommending inpatient geropsych given that she has an increase in confabulation and perseveration; she continues to have paranoid delusions and psychiatry renewed her IVC and the current plan is to transfer to inpatient psychiatric facility once bed is available  Acute cystitis ruled out. Dysuria due to undetermined cause -Initially thought to be concerning infection and received Macrobid.   -Eventually insignificant colony/growth therefore antibiotics discontinued.   T2DM, poorly controlled -Currently on long-acting regimen with Semglee 13 units subcu nightly with moderate NovoLog/scale insulin before meals and at bedtime and insulin aspart has been increased to 10 units 3 times daily with meals -Will further adjust as  necessary. -CBG trend: Recent Labs  Lab 11/22/23 2024 11/23/23 0740 11/23/23 1154 11/23/23 1700 11/23/23 2123 11/24/23 0807 11/24/23 1155  GLUCAP  252* 190* 251* 174* 214* 108* 253*    Essential HTN -C/w Home Amlodipine 10 mg po Daily and losartan 100 mg p.o. daily -Continue to monitor blood pressures per protocol -Last blood pressure reading was on the softer side at 105/75   HLD -Continue Rosuvastatin 20 mg po Daily    History of CVA -Chronic.  Rosuvastatin 20 mg po Daily    Depression and anxiety -Continue Sertraline 50 mg po Daily and Risperidone 0.25 mg p.o. nightly   Asthma -C/w Bronchodilators as needed with Albuterol 3 mL IH q6hprn Wheezing and SOB -Also continue Dulera 2 puffs IH twice daily   Class I Obesity -Complicates overall prognosis and care -Estimated body mass index is 31.85 kg/m as calculated from the following:   Height as of this encounter: 5\' 2"  (1.575 m).   Weight as of this encounter: 79 kg.  -Weight Loss and Dietary Counseling given   Medically cleared to be transferred to inpatient psych.  Her psychiatry treatment can continue at the facility.  Consultants: Psychiatry Procedures performed: As delineated as above Disposition:  Inpatient psychiatric facility Diet recommendation:  Discharge Diet Orders (From admission, onward)     Start     Ordered   11/17/23 0000  Diet - low sodium heart healthy        11/17/23 1046   11/17/23 0000  Diet Carb Modified        11/17/23 1046           Carb modified diet DISCHARGE MEDICATION: Allergies as of 11/25/2023       Reactions   Bee Venom Anaphylaxis   Olive Oil Anaphylaxis, Swelling, Other (See Comments)   Throat swelling; includes olives   Oysters [shellfish Allergy] Anaphylaxis, Swelling, Other (See Comments)   Throat swelling   Penicillins Anaphylaxis, Rash   Sulfa Antibiotics Diarrhea   Sulfasalazine Diarrhea   Tramadol Other (See Comments)   Low B/P        Medication List     TAKE these medications    albuterol 108 (90 Base) MCG/ACT inhaler Commonly known as: VENTOLIN HFA Inhale 1-2 puffs into the lungs every 6 (six)  hours as needed for wheezing or shortness of breath.   amLODipine 10 MG tablet Commonly known as: NORVASC Take 10 mg by mouth in the morning.   aspirin 81 MG chewable tablet Chew 1 tablet (81 mg total) by mouth daily.   diclofenac 50 MG EC tablet Commonly known as: VOLTAREN Take 50 mg by mouth 2 (two) times daily.   donepezil 5 MG tablet Commonly known as: ARICEPT Take 5 mg by mouth at bedtime.   fluticasone 50 MCG/ACT nasal spray Commonly known as: FLONASE Place 2 sprays into both nostrils daily as needed for allergies.   hydrochlorothiazide 12.5 MG tablet Commonly known as: HYDRODIURIL Take 12.5 mg by mouth daily.   Insulin Aspart FlexPen 100 UNIT/ML Commonly known as: NOVOLOG Inject 3 Units into the skin 3 (three) times daily with meals.   insulin degludec 100 UNIT/ML FlexTouch Pen Commonly known as: TRESIBA Inject 10 Units into the skin daily.   Insulin Pen Needle 31G X 5 MM Misc Use to inject insulin once daily   levothyroxine 50 MCG tablet Commonly known as: SYNTHROID Take 50 mcg by mouth daily before breakfast.   lip balm ointment Apply topically as needed.   loratadine 10  MG tablet Commonly known as: CLARITIN Take 1 tablet (10 mg total) by mouth daily as needed for allergies.   losartan 100 MG tablet Commonly known as: COZAAR Take 1 tablet (100 mg total) by mouth daily. What changed: when to take this   Ozempic (2 MG/DOSE) 8 MG/3ML Sopn Generic drug: Semaglutide (2 MG/DOSE) Inject 0.75 mLs (2 mg total) into the skin every 7 days. What changed: when to take this   pioglitazone 45 MG tablet Commonly known as: ACTOS Take 45 mg by mouth daily.   risperiDONE 0.5 MG disintegrating tablet Commonly known as: RISPERDAL M-TABS Take 1 tablet (0.5 mg total) by mouth at bedtime.   rosuvastatin 20 MG tablet Commonly known as: CRESTOR Take 20 mg by mouth at bedtime.   sertraline 100 MG tablet Commonly known as: ZOLOFT Take 1 tablet (100 mg total) by  mouth daily. What changed:  medication strength how much to take   Vitamin D (Ergocalciferol) 1.25 MG (50000 UNIT) Caps capsule Commonly known as: DRISDOL Take 50,000 Units by mouth every Sunday. Thursdays        Follow-up Information     [facility] Follow up.   Specialty: Home Health Services Why: A representative with [facility] will contact you within 24-48 hours from leaving the emergency room regarding your home health physical therapy, occupational therapy and social work services. Contact information: [address] [place] [address] [phone]         Judd Lien, PA-C. Schedule an appointment as soon as possible for a visit in 1 week(s).   Specialty: Physician Assistant Why: To be seen with repeat labs (CBC & BMP).  Family physician to follow-up urine culture results that were sent from the hospital and adjust antibiotics as needed. Contact information: [address] [place] [address] [phone]                Discharge Exam: Filed Weights   11/16/23 2314  Weight: 79 kg   Vitals:   11/25/23 0916 11/25/23 1016  BP: 111/68 (!) 153/58  Pulse: 66 74  Resp: 18 16  Temp: 98 F (36.7 C) 98.1 F (36.7 C)  SpO2: 99% 100%   Examination: Physical Exam:   Constitutional: WN/WD obese Caucasian female in NAD Respiratory: Clear to auscultation bilaterally, no wheezing, rales, rhonchi or crackles. Normal respiratory effort and patient is not tachypenic. No accessory muscle use.  Cardiovascular: RRR, no murmurs / rubs / gallops. S1 and S2 auscultated. No extremity edema.  Abdomen: Soft, non-tender, non-distended. Musculoskeletal: No clubbing / cyanosis of digits/nails. No joint deformity upper and lower extremities. Skin: No rashes, lesions, ulcers on a limited skin evaluation. No induration; Warm and dry.  Neurologic: CN 2-12 grossly intact with no focal deficits. Romberg sign and cerebellar  reflexes not assessed.  Psychiatric: Normal judgment and insight. Alert and oriented x 3. Normal mood and appropriate affect.   Condition at discharge: stable  The results of significant diagnostics from this hospitalization (including imaging, microbiology, ancillary and laboratory) are listed below for reference.   Imaging Studies: MR BRAIN WO CONTRAST Result Date: 11/16/2023 CLINICAL DATA:  Initial evaluation for mental status change, unknown cause. EXAM: MRI HEAD WITHOUT CONTRAST TECHNIQUE: Multiplanar, multiecho pulse sequences of the brain and surrounding structures were obtained without intravenous contrast. COMPARISON:  Prior study from 05/11/2023. FINDINGS: Brain: Cerebral volume within normal limits. Extensive patchy and confluent T2/FLAIR signal abnormality involving the supratentorial cerebral white matter, consistent with  advanced chronic microvascular ischemic disease. No evidence for acute or subacute ischemia. Gray-white matter differentiation maintained. No acute intracranial hemorrhage. Innumerable chronic micro hemorrhages again noted, most pronounced posteriorly. No mass lesion, midline shift or mass effect. No hydrocephalus or extra-axial fluid collection. Pituitary gland suprasellar region within normal limits. Vascular: Major intracranial vascular flow voids are maintained. Skull and upper cervical spine: Craniocervical junction within normal limits. Bone marrow signal intensity normal. No scalp soft tissue abnormality. Sinuses/Orbits: Globes orbital soft tissues within normal limits. Paranasal sinuses are largely clear. No significant mastoid effusion. Other: None. IMPRESSION: 1. No acute intracranial abnormality. 2. Advanced chronic microvascular ischemic disease. 3. Innumerable chronic micro hemorrhages, most pronounced posteriorly. Findings could be related to chronic poorly controlled hypertension or possibly cerebral amyloid angiopathy. Electronically Signed   By: Rise Mu M.D.   On: 11/16/2023 19:03    Microbiology: Results for orders placed or performed during the hospital encounter of 11/16/23  Urine Culture     Status: Abnormal   Collection Time: 11/16/23 12:50 PM   Specimen: Urine, Clean Catch  Result Value Ref Range Status   Specimen Description   Final    URINE, CLEAN CATCH Performed at [facility], [address] [place], [place] [address]    Special Requests   Final    NONE Performed at [facility], [address] [place], [place] [address]    Culture (A)  Final    <10,000 COLONIES/mL INSIGNIFICANT GROWTH Performed at [facility], [address], [place], [place] [address]    Report Status 11/17/2023 FINAL  Final  SARS Coronavirus 2 by RT PCR (hospital order, performed in [facility] hospital lab) *cepheid single result test* Anterior Nasal Swab     Status: None   Collection Time: 11/24/23  4:16 PM   Specimen: Anterior Nasal Swab  Result Value Ref Range Status   SARS Coronavirus 2 by RT PCR NEGATIVE NEGATIVE Final    Comment: (NOTE) SARS-CoV-2 target nucleic acids are NOT DETECTED.  The SARS-CoV-2 RNA is generally detectable in upper and lower respiratory specimens during the acute phase of infection. The lowest concentration of SARS-CoV-2 viral copies this assay can detect is 250 copies / mL. A negative result does not preclude SARS-CoV-2 infection and should not be used as the sole basis for treatment or other patient management decisions.  A negative result may occur with improper specimen collection / handling, submission of specimen other than nasopharyngeal swab, presence of viral mutation(s) within the areas targeted by this assay, and inadequate number of viral copies (<250 copies / mL). A negative result must be combined with clinical observations, patient history, and epidemiological information.  Fact Sheet for Patients:    RoadLapTop.co.za  Fact Sheet for Healthcare Providers: http://kim-miller.com/  This test is not yet approved or  cleared by the [place] FDA and has been authorized for detection and/or diagnosis of SARS-CoV-2 by FDA under an Emergency Use Authorization (EUA).  This EUA will remain in effect (meaning this test can be used) for the duration of the COVID-19 declaration under Section 564(b)(1) of the Act, 21 U.S.C. section 360bbb-3(b)(1), unless the authorization is terminated or revoked sooner.  Performed at [facility], [address] [place], [place] [address]    Labs: CBC: Recent Labs  Lab 11/20/23 0529 11/21/23 0540 11/22/23 0619 11/23/23 0514 11/24/23 0629  WBC 8.9 8.4 9.5 8.5 9.0  NEUTROABS  --   --   --  5.1  --  HGB 13.9 13.9 13.8 13.4 12.6  HCT 41.7 41.1 43.3 41.7 38.5  MCV 87.2 88.0 89.3 91.0 89.5  PLT 226 215 204 205 210   Basic Metabolic Panel: Recent Labs  Lab 11/20/23 0529 11/21/23 0540 11/22/23 0619 11/23/23 0514 11/24/23 0629  NA 139 138 140 138 139  K 3.7 4.2 4.1 4.0 4.0  CL 106 107 108 108 108  CO2 26 24 23 22 23   GLUCOSE 129* 139* 137* 200* 125*  BUN 18 11 21 21 22   CREATININE 0.56 0.51 0.42* 0.39* 0.57  CALCIUM 9.4 9.3 9.5 9.2 9.1  MG 2.2 2.2 2.2 2.3 2.2  PHOS  --   --   --  3.9  --    Liver Function Tests: Recent Labs  Lab 11/23/23 0514  AST 24  ALT 27  ALKPHOS 63  BILITOT 0.6  PROT 6.2*  ALBUMIN 3.5   CBG: Recent Labs  Lab 11/24/23 0807 11/24/23 1155 11/24/23 1612 11/24/23 2156 11/25/23 0741  GLUCAP 108* 253* 203* 76 95   Discharge time spent: greater than 30 minutes.  Signed: Marguerita Merles, DO [facility] 11/25/2023

## 2023-11-25 NOTE — Plan of Care (Signed)

## 2023-11-25 NOTE — Plan of Care (Signed)
  Problem: Health Behavior/Discharge Planning: Goal: Ability to identify and utilize available resources and services will improve Outcome: Not Progressing   Problem: Metabolic: Goal: Ability to maintain appropriate glucose levels will improve Outcome: Progressing   Problem: Nutritional: Goal: Maintenance of adequate nutrition will improve Outcome: Progressing Goal: Progress toward achieving an optimal weight will improve Outcome: Progressing   Problem: Tissue Perfusion: Goal: Adequacy of tissue perfusion will improve Outcome: Progressing   Problem: Activity: Goal: Risk for activity intolerance will decrease Outcome: Progressing   Problem: Nutrition: Goal: Adequate nutrition will be maintained Outcome: Progressing   Problem: Coping: Goal: Level of anxiety will decrease Outcome: Not Progressing   Problem: Education: Goal: Knowledge of General Education information will improve Description: Including pain rating scale, medication(s)/side effects and non-pharmacologic comfort measures Outcome: Not Progressing

## 2023-11-25 NOTE — BHH Suicide Risk Assessment (Signed)
[facility] Admission Suicide Risk Assessment   Nursing information obtained from:  Patient Demographic factors:  Age 67 or older, Caucasian, Unemployed Current Mental Status:  NA Loss Factors:  Decline in physical health Historical Factors:  NA Risk Reduction Factors:  NA  Total Time spent with patient: 45 minutes Principal Problem: MDD (major depressive disorder), recurrent, severe, with psychosis (HCC) Diagnosis:  Principal Problem:   MDD (major depressive disorder), recurrent, severe, with psychosis (HCC)  Subjective Data: The patient is a 67 old obese Caucasian female, reportedly lives alone in an apartment complex, states that her sister lives nearby and intermittently comes stays with her, medical history significant for poorly controlled type II DM/IDDM, HTN, HLD, acute ischemic stroke 05/2023 with reported left-sided deficits, hypothyroidism, asthma, early dementia, vitamin D deficiency, brought in by EMS to ED on 11/16/2023 due to concern for altered mental status. As per EDP note, patient presented to the ED with confusion and neighbors called 911 because she was confused but also has history of being confused. She was reportedly knocking on neighbors door. Patient has had aggressive behavior. Upon admission AAO X4. Concerns of possible acute cystitis started on Macrobid which was later discontinued. Due to aggressive behavior, psychiatry was consulted. She continued to have significant paranoid ideations.   Continued Clinical Symptoms:  Alcohol Use Disorder Identification Test Final Score (AUDIT): 0 The "Alcohol Use Disorders Identification Test", Guidelines for Use in Primary Care, Second Edition.  World Science writer [facility]). Score between 0-7:  no or low risk or alcohol related problems. Score between 8-15:  moderate risk of alcohol related problems. Score between 16-19:  high risk of alcohol related problems. Score 20 or above:  warrants further diagnostic evaluation for alcohol  dependence and treatment.   CLINICAL FACTORS:   Depression:   Anhedonia   Musculoskeletal: Strength & Muscle Tone: within normal limits Gait & Station: normal Patient leans: Right  Psychiatric Specialty Exam:  Presentation  General Appearance: No data recorded Eye Contact:No data recorded Speech:No data recorded Speech Volume:No data recorded Handedness:No data recorded  Mood and Affect  Mood:No data recorded Affect:No data recorded  Thought Process  Thought Processes:No data recorded Descriptions of Associations:No data recorded Orientation:No data recorded Thought Content:No data recorded History of Schizophrenia/Schizoaffective disorder:No data recorded Duration of Psychotic Symptoms:No data recorded Hallucinations:No data recorded Ideas of Reference:No data recorded Suicidal Thoughts:No data recorded Homicidal Thoughts:No data recorded  Sensorium  Memory:No data recorded Judgment:No data recorded Insight:No data recorded  Executive Functions  Concentration:No data recorded Attention Span:No data recorded Recall:No data recorded Fund of Knowledge:No data recorded Language:No data recorded  Psychomotor Activity  Psychomotor Activity:No data recorded  Assets  Assets:No data recorded  Sleep  Sleep:No data recorded    Blood pressure (!) 153/58, pulse 74, temperature 98.1 F (36.7 C), temperature source Oral, resp. rate 16, height 5\' 2"  (1.575 m), weight 77.1 kg, SpO2 100%. Body mass index is 31.09 kg/m.   COGNITIVE FEATURES THAT CONTRIBUTE TO RISK:  None    SUICIDE RISK:   Minimal: No identifiable suicidal ideation.  Patients presenting with no risk factors but with morbid ruminations; may be classified as minimal risk based on the severity of the depressive symptoms  PLAN OF CARE: See orders  I certify that inpatient services furnished can reasonably be expected to improve the patient's condition.   Sarina Ill, DO 11/25/2023,  11:59 AM

## 2023-11-25 NOTE — Group Note (Signed)
[facility] LCSW Group Therapy Note   Group Date: 11/25/2023 Start Time: 1320 End Time: 1400   Type of Therapy/Topic:  Group Therapy:  Balance in Life  Participation Level:  Did Not Attend   Description of Group:    This group will address the concept of balance and how it feels and looks when one is unbalanced. Patients will be encouraged to process areas in their lives that are out of balance, and identify reasons for remaining unbalanced. Facilitators will guide patients utilizing problem- solving interventions to address and correct the stressor making their life unbalanced. Understanding and applying boundaries will be explored and addressed for obtaining  and maintaining a balanced life. Patients will be encouraged to explore ways to assertively make their unbalanced needs known to significant others in their lives, using other group members and facilitator for support and feedback.  Therapeutic Goals: Patient will identify two or more emotions or situations they have that consume much of in their lives. Patient will identify signs/triggers that life has become out of balance:  Patient will identify two ways to set boundaries in order to achieve balance in their lives:  Patient will demonstrate ability to communicate their needs through discussion and/or role plays  Summary of Patient Progress:    Patient arrived at same time of group and was not able to attend.    Therapeutic Modalities:   Cognitive Behavioral Therapy Solution-Focused Therapy Assertiveness Training   [facility], [address]

## 2023-11-25 NOTE — Plan of Care (Signed)
  Problem: Education: Goal: Utilization of techniques to improve thought processes will improve Outcome: Not Progressing Goal: Knowledge of the prescribed therapeutic regimen will improve Outcome: Not Progressing   Problem: Education: Goal: Ability to state activities that reduce stress will improve Outcome: Not Progressing   Problem: Education: Goal: Emotional status will improve Outcome: Not Progressing Goal: Mental status will improve Outcome: Not Progressing

## 2023-11-26 DIAGNOSIS — F333 Major depressive disorder, recurrent, severe with psychotic symptoms: Secondary | ICD-10-CM | POA: Diagnosis not present

## 2023-11-26 LAB — GLUCOSE, CAPILLARY
Glucose-Capillary: 147 mg/dL — ABNORMAL HIGH (ref 70–99)
Glucose-Capillary: 225 mg/dL — ABNORMAL HIGH (ref 70–99)
Glucose-Capillary: 283 mg/dL — ABNORMAL HIGH (ref 70–99)
Glucose-Capillary: 81 mg/dL (ref 70–99)
Glucose-Capillary: 243 mg/dL — ABNORMAL HIGH (ref 70–99)

## 2023-11-26 MED ORDER — QUETIAPINE FUMARATE 25 MG PO TABS
50.0000 mg | ORAL_TABLET | Freq: Every day | ORAL | Status: DC
  Administered 2023-11-26 – 2023-12-23 (×28): 50 mg via ORAL
  Filled 2023-11-26 (×29): qty 2

## 2023-11-26 NOTE — Group Note (Signed)
Recreation Therapy Group Note   Group Topic:Coping Skills  Group Date: 11/26/2023 Start Time: 1500 End Time: 1550 Facilitators: Rosina Lowenstein, LRT, CTRS Location:  Dayroom  Group Description: Coping A-Z. LRT and patients engage in a guided discussion on what coping skills are and gave specific examples. LRT passed out a handout labeled Coping A-Z with blank spaces beside each letter. LRT prompted patients to come up with a coping skill for each of the letters. LRT and patients went over the handout and gave ideas for each letter if anyone had any blanks left on their paper. Patients kept this handout with them that listed 26 different coping skills.   Goal Area(s) Addressed: Patients will be able to define "coping skills". Patient will identify new coping skills.  Patient will increase communication.   Affect/Mood: Appropriate   Participation Level: Moderate   Participation Quality: Independent   Behavior: Alert   Speech/Thought Process: Loose association   Insight: Limited   Judgement: Limited   Modes of Intervention: Education, Guided Discussion, and Worksheet   Patient Response to Interventions:  Disengaged   Education Outcome:  In group clarification offered    Clinical Observations/Individualized Feedback: Natasha Chandler was somewhat active in their participation of session activities and group discussion. Pt identified "dressing and penciling" as coping skills. Pt was off topic often, however, was pleasant.    Plan: Continue to engage patient in RT group sessions 2-3x/week.   Rosina Lowenstein, LRT, CTRS 11/26/2023 5:38 PM

## 2023-11-26 NOTE — Group Note (Signed)
Date:  11/26/2023 Time:  12:09 AM  Group Topic/Focus:  Self Care:   The focus of this group is to help patients understand the importance of self-care in order to improve or restore emotional, physical, spiritual, interpersonal, and financial health.    Participation Level:  Active  Participation Quality:  Appropriate  Affect:  Appropriate  Cognitive:  Appropriate  Insight: Good  Engagement in Group:  Engaged  Modes of Intervention:  Discussion  Additional Comments:    Natasha Chandler 11/26/2023, 12:09 AM

## 2023-11-26 NOTE — BH IP Treatment Plan (Signed)
Interdisciplinary Treatment and Diagnostic Plan Update  11/26/2023 Time of Session: 10:05 AM  Natasha Chandler MRN: [id number]  Principal Diagnosis: MDD (major depressive disorder), recurrent, severe, with psychosis (HCC)  Secondary Diagnoses: Principal Problem:   MDD (major depressive disorder), recurrent, severe, with psychosis (HCC)   Current Medications:  Current Facility-Administered Medications  Medication Dose Route Frequency Provider Last Rate Last Admin   acetaminophen (TYLENOL) suppository 650 mg  650 mg Rectal Q6H PRN Lamar Sprinkles, MD       albuterol (PROVENTIL) (2.5 MG/3ML) 0.083% nebulizer solution 3 mL  3 mL Inhalation Q6H PRN Lamar Sprinkles, MD       alum & mag hydroxide-simeth (MAALOX/MYLANTA) 200-200-20 MG/5ML suspension 30 mL  30 mL Oral Q4H PRN Lamar Sprinkles, MD       amLODipine (NORVASC) tablet 10 mg  10 mg Oral Daily Lamar Sprinkles, MD   10 mg at 11/26/23 2952   aspirin EC tablet 81 mg  81 mg Oral Daily Sarina Ill, DO   81 mg at 11/26/23 8413   donepezil (ARICEPT) tablet 5 mg  5 mg Oral QHS Lamar Sprinkles, MD   5 mg at 11/25/23 2222   fluticasone (FLONASE) 50 MCG/ACT nasal spray 2 spray  2 spray Each Nare Daily PRN Lamar Sprinkles, MD       hydrochlorothiazide (HYDRODIURIL) tablet 12.5 mg  12.5 mg Oral Daily Sarina Ill, DO   12.5 mg at 11/26/23 0924   insulin aspart (novoLOG) injection 0-15 Units  0-15 Units Subcutaneous TID WC Lamar Sprinkles, MD   2 Units at 11/26/23 2440   insulin aspart (novoLOG) injection 0-5 Units  0-5 Units Subcutaneous QHS Lamar Sprinkles, MD   3 Units at 11/25/23 2209   insulin aspart (novoLOG) injection 10 Units  10 Units Subcutaneous TID WC Lamar Sprinkles, MD   10 Units at 11/26/23 0832   insulin glargine-yfgn (SEMGLEE) injection 13 Units  13 Units Subcutaneous QHS Lamar Sprinkles, MD   13 Units at 11/25/23 2207   levothyroxine (SYNTHROID) tablet 50 mcg  50 mcg Oral Q0600 Sarina Ill, DO        loratadine (CLARITIN) tablet 10 mg  10 mg Oral Daily PRN Lamar Sprinkles, MD       losartan (COZAAR) tablet 100 mg  100 mg Oral Daily Lamar Sprinkles, MD   100 mg at 11/26/23 1027   magnesium hydroxide (MILK OF MAGNESIA) suspension 30 mL  30 mL Oral Daily PRN Lamar Sprinkles, MD       mometasone-formoterol (DULERA) 200-5 MCG/ACT inhaler 2 puff  2 puff Inhalation BID Lamar Sprinkles, MD   2 puff at 11/26/23 0923   OLANZapine zydis (ZYPREXA) disintegrating tablet 5 mg  5 mg Oral TID PRN Lamar Sprinkles, MD       ondansetron (ZOFRAN) tablet 4 mg  4 mg Oral Q6H PRN Lamar Sprinkles, MD       Or   ondansetron (ZOFRAN) injection 4 mg  4 mg Intravenous Q6H PRN Lamar Sprinkles, MD       pneumococcal 20-valent conjugate vaccine (PREVNAR 20) injection 0.5 mL  0.5 mL Intramuscular Tomorrow-1000 Sarina Ill, DO       risperiDONE (RISPERDAL M-TABS) disintegrating tablet 0.5 mg  0.5 mg Oral QHS Lamar Sprinkles, MD   0.5 mg at 11/25/23 2207   rosuvastatin (CRESTOR) tablet 20 mg  20 mg Oral Daily Lamar Sprinkles, MD   20 mg at 11/26/23 0924   senna-docusate (Senokot-S) tablet 1 tablet  1 tablet Oral QHS PRN  Lamar Sprinkles, MD       sertraline (ZOLOFT) tablet 100 mg  100 mg Oral Daily Lamar Sprinkles, MD   100 mg at 11/26/23 1610   PTA Medications: Medications Prior to Admission  Medication Sig Dispense Refill Last Dose/Taking   albuterol (PROVENTIL HFA;VENTOLIN HFA) 108 (90 Base) MCG/ACT inhaler Inhale 1-2 puffs into the lungs every 6 (six) hours as needed for wheezing or shortness of breath.       amLODipine (NORVASC) 10 MG tablet Take 10 mg by mouth in the morning.      aspirin 81 MG chewable tablet Chew 1 tablet (81 mg total) by mouth daily.      diclofenac (VOLTAREN) 50 MG EC tablet Take 50 mg by mouth 2 (two) times daily.      donepezil (ARICEPT) 5 MG tablet Take 5 mg by mouth at bedtime.      fluticasone (FLONASE) 50 MCG/ACT nasal spray Place 2 sprays into both nostrils daily as needed for  allergies.      hydrochlorothiazide (HYDRODIURIL) 12.5 MG tablet Take 12.5 mg by mouth daily.      Insulin Aspart FlexPen (NOVOLOG) 100 UNIT/ML Inject 3 Units into the skin 3 (three) times daily with meals.      insulin degludec (TRESIBA) 100 UNIT/ML FlexTouch Pen Inject 10 Units into the skin daily.      Insulin Pen Needle 31G X 5 MM MISC Use to inject insulin once daily 30 each 5    levothyroxine (SYNTHROID) 50 MCG tablet Take 50 mcg by mouth daily before breakfast.      lip balm (CARMEX) ointment Apply topically as needed.      loratadine (CLARITIN) 10 MG tablet Take 1 tablet (10 mg total) by mouth daily as needed for allergies.      losartan (COZAAR) 100 MG tablet Take 1 tablet (100 mg total) by mouth daily. (Patient taking differently: Take 100 mg by mouth at bedtime.)      pioglitazone (ACTOS) 45 MG tablet Take 45 mg by mouth daily.      risperiDONE (RISPERDAL M-TABS) 0.5 MG disintegrating tablet Take 1 tablet (0.5 mg total) by mouth at bedtime.      rosuvastatin (CRESTOR) 20 MG tablet Take 20 mg by mouth at bedtime.      Semaglutide, 2 MG/DOSE, (OZEMPIC, 2 MG/DOSE,) 8 MG/3ML SOPN Inject 0.75 mLs (2 mg total) into the skin every 7 days. 9 mL 1    sertraline (ZOLOFT) 100 MG tablet Take 1 tablet (100 mg total) by mouth daily.      Vitamin D, Ergocalciferol, (DRISDOL) 1.25 MG (50000 UNIT) CAPS capsule Take 50,000 Units by mouth every Sunday. Thursdays       Patient Stressors: Health problems   Marital or family conflict    Patient Strengths: Ability for insight  Capable of independent living  Motivation for treatment/growth  Supportive family/friends   Treatment Modalities: Medication Management, Group therapy, Case management,  1 to 1 session with clinician, Psychoeducation, Recreational therapy.   Physician Treatment Plan for Primary Diagnosis: MDD (major depressive disorder), recurrent, severe, with psychosis (HCC) Long Term Goal(s): Improvement in symptoms so as ready for  discharge   Short Term Goals: Ability to identify changes in lifestyle to reduce recurrence of condition will improve Ability to verbalize feelings will improve Ability to disclose and discuss suicidal ideas Ability to demonstrate self-control will improve Ability to identify and develop effective coping behaviors will improve Ability to maintain clinical measurements within normal limits will improve Compliance with  prescribed medications will improve Ability to identify triggers associated with substance abuse/mental health issues will improve  Medication Management: Evaluate patient's response, side effects, and tolerance of medication regimen.  Therapeutic Interventions: 1 to 1 sessions, Unit Group sessions and Medication administration.  Evaluation of Outcomes: Progressing  Physician Treatment Plan for Secondary Diagnosis: Principal Problem:   MDD (major depressive disorder), recurrent, severe, with psychosis (HCC)  Long Term Goal(s): Improvement in symptoms so as ready for discharge   Short Term Goals: Ability to identify changes in lifestyle to reduce recurrence of condition will improve Ability to verbalize feelings will improve Ability to disclose and discuss suicidal ideas Ability to demonstrate self-control will improve Ability to identify and develop effective coping behaviors will improve Ability to maintain clinical measurements within normal limits will improve Compliance with prescribed medications will improve Ability to identify triggers associated with substance abuse/mental health issues will improve     Medication Management: Evaluate patient's response, side effects, and tolerance of medication regimen.  Therapeutic Interventions: 1 to 1 sessions, Unit Group sessions and Medication administration.  Evaluation of Outcomes: Progressing   RN Treatment Plan for Primary Diagnosis: MDD (major depressive disorder), recurrent, severe, with psychosis (HCC) Long  Term Goal(s): Knowledge of disease and therapeutic regimen to maintain health will improve  Short Term Goals: Ability to remain free from injury will improve, Ability to verbalize frustration and anger appropriately will improve, Ability to demonstrate self-control, Ability to participate in decision making will improve, Ability to verbalize feelings will improve, Ability to disclose and discuss suicidal ideas, Ability to identify and develop effective coping behaviors will improve, and Compliance with prescribed medications will improve  Medication Management: RN will administer medications as ordered by provider, will assess and evaluate patient's response and provide education to patient for prescribed medication. RN will report any adverse and/or side effects to prescribing provider.  Therapeutic Interventions: 1 on 1 counseling sessions, Psychoeducation, Medication administration, Evaluate responses to treatment, Monitor vital signs and CBGs as ordered, Perform/monitor CIWA, COWS, AIMS and Fall Risk screenings as ordered, Perform wound care treatments as ordered.  Evaluation of Outcomes: Progressing   LCSW Treatment Plan for Primary Diagnosis: MDD (major depressive disorder), recurrent, severe, with psychosis (HCC) Long Term Goal(s): Safe transition to appropriate next level of care at discharge, Engage patient in therapeutic group addressing interpersonal concerns.  Short Term Goals: Engage patient in aftercare planning with referrals and resources, Increase social support, Increase ability to appropriately verbalize feelings, Increase emotional regulation, Facilitate acceptance of mental health diagnosis and concerns, Facilitate patient progression through stages of change regarding substance use diagnoses and concerns, Identify triggers associated with mental health/substance abuse issues, and Increase skills for wellness and recovery  Therapeutic Interventions: Assess for all discharge  needs, 1 to 1 time with Social worker, Explore available resources and support systems, Assess for adequacy in community support network, Educate family and significant other(s) on suicide prevention, Complete Psychosocial Assessment, Interpersonal group therapy.  Evaluation of Outcomes: Progressing   Progress in Treatment: Attending groups: Yes. and No. Participating in groups: Yes. and No. Taking medication as prescribed: Yes. Toleration medication: Yes. Family/Significant other contact made: No, will contact:  CSW will contact if given permission  Patient understands diagnosis: Yes. Discussing patient identified problems/goals with staff: Yes. Medical problems stabilized or resolved: Yes. Denies suicidal/homicidal ideation: Yes. Issues/concerns per patient self-inventory: No. Other: None  New problem(s) identified: No, Describe:  None identified   New Short Term/Long Term Goal(s):  elimination of symptoms of psychosis, medication management  for mood stabilization; elimination of SI thoughts; development of comprehensive mental wellness plan.   Patient Goals:  "Besides getting back to my family, getting back to my life, I have a two bedroom house"   Discharge Plan or Barriers: CSW will assist with appropriate discharge planning   Reason for Continuation of Hospitalization: Depression Medication stabilization  Estimated Length of Stay: 1 to 7 days   Last 3 [place] Suicide Severity Risk Score: Flowsheet Row Admission (Current) from 11/25/2023 in [facility] to Hosp-Admission (Discharged) from 11/16/2023 in [facility] from 08/09/2023 in [facility] Department at [facility]  C-SSRS RISK CATEGORY No Risk No Risk No Risk       Last PHQ 2/9 Scores:    08/13/2020    4:59 PM 08/04/2020    3:16 PM 12/15/2015    7:07 PM  Depression screen PHQ 2/9  Decreased Interest 1 1 0  Down, Depressed,  Hopeless 2 2 0  PHQ - 2 Score 3 3 0  Altered sleeping 3 3   Tired, decreased energy 2 2   Change in appetite 2 2   Feeling bad or failure about yourself  1 1   Trouble concentrating 2 2   Moving slowly or fidgety/restless 2 2   Suicidal thoughts 0 0   PHQ-9 Score 15 15   Difficult doing work/chores Somewhat difficult Somewhat difficult     Scribe for Treatment Team: Elza Rafter, LCSWA 11/26/2023 10:30 AM

## 2023-11-26 NOTE — Group Note (Signed)
Recreation Therapy Group Note   Group Topic:Health and Wellness  Group Date: 11/26/2023 Start Time: 1100 End Time: 1145 Facilitators: Rosina Lowenstein, LRT, CTRS Location:  Dayroom  Group Description: Seated Exercise. LRT discussed the mental and physical benefits of exercise. LRT and group discussed how physical activity can be used as a coping skill. Pt's and LRT followed along to an exercise video on the TV screen that provided a visual representation and audio description of every exercise performed. Pt's encouraged to listen to their bodies and stop at any time if they experience feelings of discomfort or pain. Pts were encouraged to drink water and stay hydrated.   Goal Area(s) Addressed: Patient will learn benefits of physical activity. Patient will identify exercise as a coping skill.  Patient will follow multistep directions.   Affect/Mood: Appropriate   Participation Level: Active and Engaged   Participation Quality: Independent   Behavior: Alert and Cooperative   Speech/Thought Process: Coherent   Insight: Fair   Judgement: Fair    Modes of Intervention: Activity   Patient Response to Interventions:  Attentive and Engaged   Education Outcome:  Acknowledges education   Clinical Observations/Individualized Feedback: Kayann was mostly active in their participation of session activities and group discussion. Pt was talkative with LRT and peers while in group. Pt completed some of the exercises as shown. Pt was pleasant while in group.    Plan: Continue to engage patient in RT group sessions 2-3x/week.   Rosina Lowenstein, LRT, CTRS 11/26/2023 12:34 PM
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
## 2023-11-26 NOTE — BH Assessment (Signed)
Recreation Therapy Notes  INPATIENT RECREATION THERAPY ASSESSMENT  Patient Details Name: SHAILI PARKS MRN: [id number] DOB: December 04, 1956 Today's Date: 11/26/2023  Able to Participate in Assessment/Interview:  No  Rosina Lowenstein 11/26/2023, 5:54 PM

## 2023-11-26 NOTE — Progress Notes (Signed)
   11/26/23 1300  Psych Admission Type (Psych Patients Only)  Admission Status Involuntary  Psychosocial Assessment  Patient Complaints Worrying  Eye Contact Fair  Facial Expression Anxious  Affect Anxious  Speech Logical/coherent  Interaction Assertive  Motor Activity Restless  Appearance/Hygiene Unremarkable  Behavior Characteristics Cooperative  Mood Depressed  Thought Process  Coherency WDL  Content WDL  Delusions WDL  Perception WDL  Hallucination None reported or observed  Judgment Impaired  Confusion Mild  Danger to Self  Current suicidal ideation? Denies  Agreement Not to Harm Self Yes  Description of Agreement verbal  Danger to Others  Danger to Others None reported or observed

## 2023-11-26 NOTE — Group Note (Signed)
Date:  11/26/2023 Time:  11:14 AM  Group Topic/Focus:  Overcoming Stress:   The focus of this group is to define stress and help patients assess their triggers.    Participation Level:  Active  Participation Quality:  Appropriate  Affect:  Appropriate  Cognitive:  Appropriate  Insight: Appropriate  Engagement in Group:  Engaged  Modes of Intervention:  Discussion   Ardelle Anton 11/26/2023, 11:14 AM

## 2023-11-26 NOTE — Progress Notes (Signed)
   11/26/23 0500  Psych Admission Type (Psych Patients Only)  Admission Status Involuntary  Psychosocial Assessment  Patient Complaints Anxiety;Worrying;Crying spells  Eye Contact Poor  Facial Expression Anxious  Affect Anxious  Speech Logical/coherent  Interaction Assertive  Motor Activity Pacing;Fidgety  Appearance/Hygiene Unremarkable  Behavior Characteristics Cooperative  Mood Anxious;Depressed  Thought Process  Coherency WDL  Content WDL  Delusions WDL  Perception WDL  Hallucination None reported or observed  Judgment Impaired  Confusion Mild  Danger to Self  Current suicidal ideation? Denies  Agreement Not to Harm Self Yes  Description of Agreement Verbal  Danger to Others  Danger to Others None reported or observed

## 2023-11-26 NOTE — Plan of Care (Signed)
  Problem: Education: Goal: Knowledge of [place] Education information/materials will improve Outcome: Progressing Goal: Emotional status will improve Outcome: Progressing Goal: Mental status will improve Outcome: Progressing   

## 2023-11-27 DIAGNOSIS — F333 Major depressive disorder, recurrent, severe with psychotic symptoms: Secondary | ICD-10-CM | POA: Diagnosis not present

## 2023-11-27 LAB — GLUCOSE, CAPILLARY
Glucose-Capillary: 178 mg/dL — ABNORMAL HIGH (ref 70–99)
Glucose-Capillary: 45 mg/dL — ABNORMAL LOW (ref 70–99)
Glucose-Capillary: 160 mg/dL — ABNORMAL HIGH (ref 70–99)
Glucose-Capillary: 257 mg/dL — ABNORMAL HIGH (ref 70–99)
Glucose-Capillary: 108 mg/dL — ABNORMAL HIGH (ref 70–99)
Glucose-Capillary: 67 mg/dL — ABNORMAL LOW (ref 70–99)

## 2023-11-27 NOTE — [facility] Suicide Risk Assessment (Signed)
[facility] INPATIENT:  Family/Significant Other Suicide Prevention Education  Suicide Prevention Education:  Patient Refusal for Family/Significant Other Suicide Prevention Education: The patient Natasha Chandler has refused to provide written consent for family/significant other to be provided Family/Significant Other Suicide Prevention Education during admission and/or prior to discharge.  Physician notified.  Elza Rafter 11/27/2023, 12:41 PM

## 2023-11-27 NOTE — Progress Notes (Signed)
   11/27/23 1100  Psych Admission Type (Psych Patients Only)  Admission Status Involuntary  Psychosocial Assessment  Patient Complaints Worrying  Eye Contact Fair  Facial Expression Anxious  Affect Anxious  Speech Logical/coherent  Interaction Assertive  Motor Activity Restless  Appearance/Hygiene Unremarkable  Behavior Characteristics Cooperative  Mood Depressed  Thought Process  Coherency WDL  Content WDL  Delusions WDL  Perception WDL  Hallucination None reported or observed  Judgment Impaired  Confusion Mild  Danger to Self  Current suicidal ideation? Denies  Agreement Not to Harm Self Yes  Description of Agreement verbal  Danger to Others  Danger to Others None reported or observed

## 2023-11-27 NOTE — Group Note (Signed)
Recreation Therapy Group Note   Group Topic:Leisure Education  Group Date: 11/27/2023 Start Time: 1100 End Time: 1200 Facilitators: Rosina Lowenstein, LRT, CTRS Location:  Dayroom  Group Description: Leisure. Patients were given the option to choose from singing karaoke, color, journaling, or listen to music. LRT and pts discussed the meaning of leisure, the importance of participating in leisure during their free time/when they're outside of the hospital, as well as how our leisure interests can also serve as coping skills.   Goal Area(s) Addressed:  Patient will identify a current leisure interest.  Patient will learn the definition of "leisure". Patient will practice making a positive decision. Patient will have the opportunity to try a new leisure activity. Patient will communicate with peers and LRT.    Affect/Mood: Appropriate   Participation Level: Moderate   Participation Quality: Independent   Behavior: Calm and Cooperative   Speech/Thought Process: Loose association   Insight: Fair   Judgement: Fair    Modes of Intervention: Activity, Education, Guided Discussion, and Music   Patient Response to Interventions:  Disengaged   Education Outcome:  In group clarification offered    Clinical Observations/Individualized Feedback: Lenise was somewhat active in their participation of session activities and group discussion. 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 Pt becomes tangential when talking and requires minimally redirection back to topic. Pt chose to talk with peer while present in group.    Plan: Continue to engage patient in RT group sessions 2-3x/week.   Rosina Lowenstein, LRT, CTRS 11/27/2023 12:56 PM

## 2023-11-27 NOTE — Group Note (Signed)
Date:  11/27/2023 Time:  2:21 AM  Group Topic/Focus:  Wrap-Up Group:   The focus of this group is to help patients review their daily goal of treatment and discuss progress on daily workbooks.    Participation Level:  Minimal  Participation Quality:  Appropriate  Affect:  Appropriate  Cognitive:  Oriented  Insight: Good  Engagement in Group:  Engaged  Modes of Intervention:  Discussion  Additional Comments:    [name] 11/27/2023, 2:21 AM

## 2023-11-27 NOTE — Plan of Care (Signed)
  Problem: Education: Goal: Knowledge of [facility] Education information/materials will improve Outcome: Progressing   Problem: Education: Goal: Knowledge of  General Education information/materials will improve Outcome: Progressing Goal: Emotional status will improve Outcome: Progressing
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
## 2023-11-27 NOTE — Plan of Care (Signed)
  Problem: Education: Goal: Utilization of techniques to improve thought processes will improve Outcome: Progressing Goal: Knowledge of the prescribed therapeutic regimen will improve Outcome: Progressing   Problem: Education: Goal: Ability to state activities that reduce stress will improve Outcome: Progressing   Problem: Education: Goal: Knowledge of [facility] Education information/materials will improve Outcome: Progressing Goal: Emotional status will improve Outcome: Progressing Goal: Mental status will improve Outcome: Progressing Goal: Verbalization of understanding the information provided will improve Outcome: Progressing

## 2023-11-27 NOTE — Group Note (Signed)
Date:  11/27/2023 Time:  2:58 PM  Group Topic/Focus:  Holiday  Music and coloring     Participation Level:  Active  Participation Quality:  Appropriate  Affect:  Appropriate  Cognitive:  Appropriate  Insight: Appropriate  Engagement in Group:  Engaged  Modes of Intervention:  Activity  Additional Comments:  none  Rodena Goldmann 11/27/2023, 2:58 PM

## 2023-11-27 NOTE — Progress Notes (Signed)
Patient's blood glucose noted to be 45 mg/dl before lunch. Patient given orange juice. At this time patient denies feeling dizzy or faint. States that she feels "uncomfortable."After lunch consumption, patient's glucose increased to 160.

## 2023-11-27 NOTE — Progress Notes (Signed)
Patient given 2 cups of orange juice and graham crackers.

## 2023-11-27 NOTE — Progress Notes (Signed)
   11/27/23 2200  Psych Admission Type (Psych Patients Only)  Admission Status Involuntary  Psychosocial Assessment  Patient Complaints Worrying  Eye Contact Fair  Facial Expression Anxious  Affect Anxious  Speech Logical/coherent  Interaction Assertive  Motor Activity Restless  Appearance/Hygiene Unremarkable  Behavior Characteristics Cooperative  Mood Depressed  Thought Process  Coherency WDL  Content WDL  Delusions WDL  Perception WDL  Hallucination None reported or observed  Judgment Impaired  Confusion Mild  Danger to Self  Current suicidal ideation? Denies  Agreement Not to Harm Self Yes  Description of Agreement Verbal  Danger to Others  Danger to Others None reported or observed

## 2023-11-27 NOTE — Progress Notes (Signed)
   11/27/23 0626  15 Minute Checks  Location Bedroom  Visual Appearance Calm  Behavior Sleeping  Sleep (Behavioral Health Patients Only)  Calculate sleep? (Click Yes once per 24 hr at 0600 safety check) Yes  Documented sleep last 24 hours 7.5
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
## 2023-11-28 DIAGNOSIS — F333 Major depressive disorder, recurrent, severe with psychotic symptoms: Secondary | ICD-10-CM | POA: Diagnosis not present

## 2023-11-28 LAB — GLUCOSE, CAPILLARY
Glucose-Capillary: 151 mg/dL — ABNORMAL HIGH (ref 70–99)
Glucose-Capillary: 115 mg/dL — ABNORMAL HIGH (ref 70–99)
Glucose-Capillary: 299 mg/dL — ABNORMAL HIGH (ref 70–99)
Glucose-Capillary: 134 mg/dL — ABNORMAL HIGH (ref 70–99)
Glucose-Capillary: 90 mg/dL (ref 70–99)
Glucose-Capillary: 75 mg/dL (ref 70–99)
Glucose-Capillary: 206 mg/dL — ABNORMAL HIGH (ref 70–99)

## 2023-11-28 NOTE — Progress Notes (Signed)
   11/28/23 0600  15 Minute Checks  Location Bedroom  Visual Appearance Calm  Behavior Composed  Sleep (Behavioral Health Patients Only)  Calculate sleep? (Click Yes once per 24 hr at 0600 safety check) Yes  Documented sleep last 24 hours 9
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
## 2023-11-28 NOTE — Group Note (Signed)
Date:  11/28/2023 Time:  11:23 AM  Group Topic/Focus:  Coloring/Word searches/Music Therapy  The purpose of this group is to allow patients to have leisure time while listening to [employer].     Participation Level:  Active  Participation Quality:  Appropriate  Affect:  Appropriate  Cognitive:  Alert and Appropriate  Insight: Appropriate  Engagement in Group:  Engaged  Modes of Intervention:  Activity  Additional Comments:    Marta Antu 11/28/2023, 11:23 AM

## 2023-11-28 NOTE — Group Note (Signed)
Date:  11/28/2023 Time:  5:55 AM  Group Topic/Focus:  Rediscovering Joy:   The focus of this group is to explore various ways to relieve stress in a positive manner. Wrap-Up Group:   The focus of this group is to help patients review their daily goal of treatment and discuss progress on daily workbooks.    Participation Level:  Did Not Attend   Additional Comments:    Osker Mason 11/28/2023, 5:55 AM
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
## 2023-11-28 NOTE — Progress Notes (Signed)
 Patient walking in the halls and stated she was feeling shaky and was concerned her blood sugar may be low.  CBG checked by this Clinical research associate and was 90.   Patient currently sitting in dayroom.
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
## 2023-11-28 NOTE — Plan of Care (Signed)
  Problem: Education: Goal: Knowledge of the prescribed therapeutic regimen will improve Outcome: Progressing   Problem: Education: Goal: Emotional status will improve Outcome: Progressing   Problem: Activity: Goal: Interest or engagement in activities will improve Outcome: Progressing

## 2023-11-29 DIAGNOSIS — F333 Major depressive disorder, recurrent, severe with psychotic symptoms: Secondary | ICD-10-CM | POA: Diagnosis not present

## 2023-11-29 LAB — GLUCOSE, CAPILLARY
Glucose-Capillary: 113 mg/dL — ABNORMAL HIGH (ref 70–99)
Glucose-Capillary: 90 mg/dL (ref 70–99)
Glucose-Capillary: 246 mg/dL — ABNORMAL HIGH (ref 70–99)
Glucose-Capillary: 81 mg/dL (ref 70–99)

## 2023-11-29 NOTE — [facility] Counselor (Signed)
CSW given verbal permission to contact pt's sister for collateral in order to further discharge planning.  Reynaldo Minium, MSW, [place] 11/29/2023 10:17 AM

## 2023-11-29 NOTE — Group Note (Signed)
Recreation Therapy Group Note   Group Topic:Self-Esteem  Group Date: 11/29/2023 Start Time: 1500 End Time: 1545 Facilitators: Rosina Lowenstein, LRT, CTRS Location:  Dayroom  Group Description: My strengths and Qualities. Patients and LRT discussed the importance of self-love/self-esteem and things that cause it to fluctuate, including our mental health or state. Pt completed a worksheet that helps them identify 24 different strengths and qualities about themselves. Pt encouraged to read aloud at least 3 off their sheet to the group. LRT and pts discussed how this can be applied to daily life post-discharge.    Goal Area(s) Addressed: Patient will identify positive qualities about themselves. Patient will learn new positive affirmations.  Patient will recite positive qualities and affirmations aloud to the group.  Patient will practice positive self-talk.  Patient will increase communication.    Affect/Mood: Appropriate   Participation Level: Active and Engaged   Participation Quality: Independent   Behavior: Alert and Cooperative   Speech/Thought Process: Loose association   Insight: Fair   Judgement: Fair    Modes of Intervention: Education, Exploration, Guided Discussion, and Worksheet   Patient Response to Interventions:  Attentive, Interested , and Receptive   Education Outcome:  Acknowledges education   Clinical Observations/Individualized Feedback: Natasha Chandler was active in their participation of session activities and group discussion. Pt identified "What I value the most are my aunt and friends. A challenge I have overcome is explaining my situation". Pt was talkative to LRT during group. Pt interacted well with LRT and peers duration of session.    Plan: Continue to engage patient in RT group sessions 2-3x/week.   Rosina Lowenstein, LRT, CTRS 11/29/2023 5:16 PM

## 2023-11-29 NOTE — Group Note (Signed)
Recreation Therapy Group Note   Group Topic:Relaxation  Group Date: 11/29/2023 Start Time: 1100 End Time: 1145 Facilitators: Rosina Lowenstein, LRT, CTRS Location:  Dayroom  Group Description: PMR (Progressive Muscle Relaxation). LRT educates patients on what PMR is and the benefits that come from it. Patients are asked to sit with their feet flat on the floor while sitting up and all the way back in their chair, if possible. LRT and pts follow a prompt through a speaker that requires you to tense and release different muscles in their body and focus on their breathing. During session, lights are off and soft music is being played. Pts are given a stress ball to use if needed.   Goal Area(s) Addressed:  Patients will be able to describe progressive muscle relaxation.  Patient will practice using relaxation technique. Patient will identify a new coping skill.  Patient will follow multistep directions to reduce anxiety and stress.   Affect/Mood: Appropriate   Participation Level: Active and Engaged   Participation Quality: Independent   Behavior: Appropriate, Calm, and Cooperative   Speech/Thought Process: Coherent   Insight: Fair   Judgement: Fair    Modes of Intervention: Activity, Education, Exploration, and Guided Discussion   Patient Response to Interventions:  Attentive, Engaged, and Receptive   Education Outcome:  Acknowledges education   Clinical Observations/Individualized Feedback: Natasha Chandler was active in their participation of session activities and group discussion. Pt completed all of the exercises as shown. Pt interacted well with LRT and peers duration of session.    Plan: Continue to engage patient in RT group sessions 2-3x/week.   Rosina Lowenstein, LRT, CTRS 11/29/2023 12:47 PM

## 2023-11-29 NOTE — Plan of Care (Signed)
  Problem: Education: Goal: Utilization of techniques to improve thought processes will improve Outcome: Progressing Goal: Knowledge of the prescribed therapeutic regimen will improve Outcome: Progressing   Problem: Education: Goal: Ability to state activities that reduce stress will improve Outcome: Progressing   Problem: Education: Goal: Knowledge of [place] [facility] Education information/materials will improve Outcome: Progressing Goal: Emotional status will improve Outcome: Progressing Goal: Mental status will improve Outcome: Progressing Goal: Verbalization of understanding the information provided will improve Outcome: Progressing   Problem: Education: Goal: Knowledge of [place] [facility] Education information/materials will improve Outcome: Progressing   Problem: Education: Goal: Knowledge of [place] [facility] Education information/materials will improve Outcome: Progressing Goal: Emotional status will improve Outcome: Progressing Goal: Mental status will improve Outcome: Progressing Goal: Verbalization of understanding the information provided will improve Outcome: Progressing   Problem: Activity: Goal: Interest or engagement in activities will improve Outcome: Progressing Goal: Sleeping patterns will improve Outcome: Progressing   Problem: Coping: Goal: Ability to verbalize frustrations and anger appropriately will improve Outcome: Progressing Goal: Ability to demonstrate self-control will improve Outcome: Progressing   Problem: Health Behavior/Discharge Planning: Goal: Identification of resources available to assist in meeting health care needs will improve Outcome: Progressing Goal: Compliance with treatment plan for underlying cause of condition will improve Outcome: Progressing   Problem: Physical Regulation: Goal: Ability to maintain clinical measurements within normal limits will improve Outcome: Progressing   Problem: Safety: Goal:  Periods of time without injury will increase Outcome: Progressing

## 2023-11-29 NOTE — Progress Notes (Signed)
   11/29/23 0940  Psych Admission Type (Psych Patients Only)  Admission Status Involuntary  Psychosocial Assessment  Patient Complaints Anxiety;Worrying  Eye Contact Fair  Facial Expression Worried  Affect Anxious;Sad  Speech Logical/coherent  Interaction Assertive  Motor Activity Other (Comment) (WDL)  Appearance/Hygiene Unremarkable  Behavior Characteristics Cooperative;Anxious  Mood Depressed;Anxious  Thought Process  Coherency WDL  Content Preoccupation  Delusions None reported or observed  Perception WDL  Hallucination None reported or observed  Judgment Impaired  Confusion Mild  Danger to Self  Current suicidal ideation? Denies  Agreement Not to Harm Self Yes  Description of Agreement Verbal  Danger to Others  Danger to Others None reported or observed
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
## 2023-11-29 NOTE — Group Note (Signed)
Date:  11/28/2023 Time:  09:00 PM  Group Topic/Focus:  Conflict Resolution:   The focus of this group is to discuss the conflict resolution process and how it may be used upon discharge.    Participation Level:  Active  Participation Quality:  Appropriate  Affect:  Appropriate  Cognitive:  Appropriate  Insight: Appropriate  Engagement in Group:  Engaged  Modes of Intervention:  Education  Additional Comments:    Garry Heater 11/28/2023, 09:00 PM

## 2023-11-29 NOTE — [facility] Counselor (Signed)
CSW contacted pt's sister,  Lexus Asad, [phone]  for collateral.   CSW unable to reach, left HIPAA compliant requesting return call  Reynaldo Minium, MSW, [facility] 11/29/2023 11:16 AM

## 2023-11-29 NOTE — Progress Notes (Signed)
Patient is pleasant , calm and cooperative and pleasant . Pt reports  anxiety and depression related to family issues and speaking to sister at home.   Pt reported a right lateral chronic pain in knee. Pt came out to the dayroom for groups and snacks , socialized with peers and staff. chair a Pt denies SI/HI/AVH  at this time  Scheduled medications administered  as ordered. Pt compliant with medications an plan of care. Pt is socializing with staff and peers. We will keep monitoring patient for the rest of the shift.

## 2023-11-29 NOTE — Plan of Care (Signed)
  Problem: Education: Goal: Utilization of techniques to improve thought processes will improve Outcome: Progressing Goal: Knowledge of the prescribed therapeutic regimen will improve Outcome: Progressing   Problem: Education: Goal: Ability to state activities that reduce stress will improve Outcome: Progressing   Problem: Education: Goal: Knowledge of [facility] Education information/materials will improve Outcome: Progressing Goal: Emotional status will improve Outcome: Progressing Goal: Mental status will improve Outcome: Not Progressing Goal: Verbalization of understanding the information provided will improve Outcome: Progressing

## 2023-11-29 NOTE — Group Note (Signed)
Date:  11/29/2023 Time:  10:40 PM  Group Topic/Focus:  Coping With Mental Health Crisis:   The purpose of this group is to help patients identify strategies for coping with mental health crisis.  Group discusses possible causes of crisis and ways to manage them effectively.    Participation Level:  Active  Participation Quality:  Appropriate  Affect:  Appropriate  Cognitive:  Appropriate  Insight: Appropriate  Engagement in Group:  Engaged  Modes of Intervention:  Education  Additional Comments:    Garry Heater 11/29/2023, 10:40 PM

## 2023-11-29 NOTE — Plan of Care (Signed)
  Problem: Education: Goal: Knowledge of the prescribed therapeutic regimen will improve Outcome: Progressing   Problem: Education: Goal: Emotional status will improve Outcome: Progressing Goal: Mental status will improve Outcome: Progressing   

## 2023-11-29 NOTE — Progress Notes (Signed)
   11/29/23 0530  15 Minute Checks  Location Bedroom  Visual Appearance Calm  Behavior Sleeping  Sleep (Behavioral Health Patients Only)  Calculate sleep? (Click Yes once per 24 hr at 0600 safety check) Yes  Documented sleep last 24 hours 6
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
## 2023-11-30 DIAGNOSIS — F333 Major depressive disorder, recurrent, severe with psychotic symptoms: Secondary | ICD-10-CM | POA: Diagnosis not present

## 2023-11-30 LAB — GLUCOSE, CAPILLARY
Glucose-Capillary: 135 mg/dL — ABNORMAL HIGH (ref 70–99)
Glucose-Capillary: 144 mg/dL — ABNORMAL HIGH (ref 70–99)
Glucose-Capillary: 155 mg/dL — ABNORMAL HIGH (ref 70–99)
Glucose-Capillary: 94 mg/dL (ref 70–99)

## 2023-11-30 NOTE — Plan of Care (Signed)
  Problem: Education: Goal: Knowledge of the prescribed therapeutic regimen will improve Outcome: Progressing   Problem: Education: Goal: Emotional status will improve Outcome: Progressing Goal: Mental status will improve Outcome: Progressing   

## 2023-11-30 NOTE — Group Note (Signed)
Recreation Therapy Group Note   Group Topic:General Recreation  Group Date: 11/30/2023 Start Time: 1100 End Time: 1150 Facilitators: Rosina Lowenstein, LRT, CTRS Location:  Dayroom  Group Description: General Recreation. Patients were given the opportunity to play cards, journal, or sing karaoke during group. Pt identified and conversated about things they enjoy doing in their free time and how they can continue to do that outside of the hospital.  Goal Area(s) Addressed: Patient will practice making a positive decision. Patient will have the opportunity to try a new leisure activity. Patient will communicate with peers and LRT.   Affect/Mood: Talkative   Participation Level: Active   Participation Quality: Independent   Behavior: Alert and Interactive    Speech/Thought Process: Coherent   Insight: Fair   Judgement: Fair    Modes of Intervention: Activity, Education, Exploration, Guided Discussion, and Music   Patient Response to Interventions:  Receptive   Education Outcome:  Acknowledges education   Clinical Observations/Individualized Feedback: Natasha Chandler was active in their participation of session activities and group discussion. Pt spoke with peers during the whole session, pt was very interactive and talkative with everyone in the dayroom.  Plan: Continue to engage patient in RT group sessions 2-3x/week.   Rosina Lowenstein, LRT, CTRS 11/30/2023 1:35 PM

## 2023-11-30 NOTE — Progress Notes (Signed)
Assumed care of patient last pm, she present A&O x 2, affect & mood is anxious with periods of confusion and she frequently requested for medications for sleep. Pt out in the milieu, sociable with select peer and approached staff making statement regarding inability to sleep for several days. Pt schedule insulin was held last evening, due to low glucose level.Pt denied thoughts, plan or intent to harm self or others and made a verbal safety commitment,  Pt is compliant with taking medications, pleasant and follows redirections. Pt educated on plan of care, medication regimen and she acknowledged an understanding and was without further complaints or concerns. Pt was maintained on q 15 min rounds for safety and support.

## 2023-11-30 NOTE — Progress Notes (Signed)
 Patient pleasant, but restless.  Patient appears anxious, worried and sad.  Mild confusion. Denies SI/HI and AVH.  Denies pain.    Compliant with scheduled medications.  BP medication held.  BP 130/54.  Dr. Marval Regal aware. 15 min checks in place for safety.  Patient is frequently at nurse's station and walking the hallways.  Preoccupied with her relationship with her sister.  Patient frequently worrying about another patient on the unit.

## 2023-11-30 NOTE — Group Note (Signed)
Recreation Therapy Group Note   Group Topic:Emotion Expression  Group Date: 11/30/2023 Start Time: 1500 End Time: 1600 Facilitators: Rosina Lowenstein, LRT, CTRS Location:  Dayroom  Group Description: Positivity Collage. LRT and patients discussed the importance of having a positive mindset and being happy. Patients received magazines, safety scissors, a glue stick and a piece of paper. Pts were encouraged to find images or words in the magazines that showed "happiness" or positivity to them. Pt shared their collage with the group once they were finished. LRT and pts discussed how it can be difficult to always have a positive mindset, especially when they have mental health challenges.   Goal Area(s) Addressed:  Pt will identify things associate with positivity. Pt will reduce negative thinking. Pt will identify a new coping skill of thinking positive thoughts.    Affect/Mood: Appropriate   Participation Level: Active and Engaged   Participation Quality: Independent   Behavior: Appropriate, Calm, and Cooperative   Speech/Thought Process: Coherent   Insight: Fair   Judgement: Fair    Modes of Intervention: Art, Education, Exploration, and Guided Discussion   Patient Response to Interventions:  Attentive, Engaged, Interested , and Receptive   Education Outcome:  Acknowledges education   Clinical Observations/Individualized Feedback: Natasha Chandler was active in their participation of session activities and group discussion. Pt identified "this vintage house that reminds me of my grandma" as something that makes her happy. Pt interacted well with LRT and peers duration of session.    Plan: Continue to engage patient in RT group sessions 2-3x/week.   Rosina Lowenstein, LRT, CTRS 11/30/2023 5:06 PM
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
## 2023-11-30 NOTE — Group Note (Signed)
Date:  11/30/2023 Time:  12:32 PM  Group Topic/Focus:  Goals Group:   The focus of this group is to help patients establish daily goals to achieve during treatment and discuss how the patient can incorporate goal setting into their daily lives to aide in recovery. Healthy Communication:   The focus of this group is to discuss communication, barriers to communication, as well as healthy ways to communicate with others.    Participation Level:  Active  Participation Quality:  Appropriate  Affect:  Appropriate  Cognitive:  Appropriate  Insight: Improving  Engagement in Group:  Developing/Improving and Engaged  Modes of Intervention:  Activity and Discussion  Additional Comments:    Rosaura Carpenter 11/30/2023, 12:32 PM

## 2023-11-30 NOTE — Progress Notes (Signed)
 Patient with anxious affect.  Pleasant and cooperative. Denies SI/HI and AVH.  Denies depression.  Denies pain. Mild confusion.   Compliant with scheduled medications.  15 min checks in place for safety. Patient is present in the milieu.  Restless, often pacing the halls.  Appropriate interaction with peers and staff.

## 2023-12-01 DIAGNOSIS — F333 Major depressive disorder, recurrent, severe with psychotic symptoms: Secondary | ICD-10-CM | POA: Diagnosis not present

## 2023-12-01 LAB — GLUCOSE, CAPILLARY
Glucose-Capillary: 106 mg/dL — ABNORMAL HIGH (ref 70–99)
Glucose-Capillary: 161 mg/dL — ABNORMAL HIGH (ref 70–99)
Glucose-Capillary: 130 mg/dL — ABNORMAL HIGH (ref 70–99)
Glucose-Capillary: 78 mg/dL (ref 70–99)

## 2023-12-01 NOTE — Progress Notes (Signed)
   12/01/23 0800  Psych Admission Type (Psych Patients Only)  Admission Status Involuntary  Psychosocial Assessment  Patient Complaints Anxiety  Eye Contact Fair  Facial Expression Animated  Affect Anxious  Speech Logical/coherent  Interaction Assertive  Motor Activity Restless  Appearance/Hygiene In scrubs  Behavior Characteristics Cooperative  Mood Depressed  Thought Process  Coherency WDL  Content Preoccupation  Delusions None reported or observed  Perception WDL  Hallucination None reported or observed  Judgment Impaired  Confusion Mild  Danger to Self  Current suicidal ideation? Denies  Agreement Not to Harm Self Yes  Description of Agreement verbal  Danger to Others  Danger to Others None reported or observed
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
## 2023-12-01 NOTE — Plan of Care (Signed)
Patient was cooperative with treatment and medications, pleasant on approach, no new behavioral issues to report on shift at this time.

## 2023-12-01 NOTE — Group Note (Signed)
Date:  12/01/2023 Time:  8:46 PM  Group Topic/Focus:  Personal Choices and Values:   The focus of this group is to help patients assess and explore the importance of values in their lives, how their values affect their decisions, how they express their values and what opposes their expression.    Participation Level:  Active  Participation Quality:  Appropriate  Affect:  Appropriate  Cognitive:  Appropriate  Insight: Good  Engagement in Group:  Engaged  Modes of Intervention:  Clarification  Additional Comments:    Burt Ek 12/01/2023, 8:46 PM

## 2023-12-01 NOTE — Plan of Care (Signed)
  Problem: Physical Regulation: Goal: Ability to maintain clinical measurements within normal limits will improve Outcome: Progressing   Problem: Safety: Goal: Periods of time without injury will increase Outcome: Progressing   

## 2023-12-01 NOTE — BH IP Treatment Plan (Signed)
Interdisciplinary Treatment and Diagnostic Plan Update  12/01/2023 Time of Session: 09:00am  Natasha Chandler MRN: [id number]  Principal Diagnosis: MDD (major depressive disorder), recurrent, severe, with psychosis (HCC)  Secondary Diagnoses: Principal Problem:   MDD (major depressive disorder), recurrent, severe, with psychosis (HCC)   Current Medications:  Current Facility-Administered Medications  Medication Dose Route Frequency Provider Last Rate Last Admin   acetaminophen (TYLENOL) suppository 650 mg  650 mg Rectal Q6H PRN Lamar Sprinkles, MD       albuterol (PROVENTIL) (2.5 MG/3ML) 0.083% nebulizer solution 3 mL  3 mL Inhalation Q6H PRN Lamar Sprinkles, MD       alum & mag hydroxide-simeth (MAALOX/MYLANTA) 200-200-20 MG/5ML suspension 30 mL  30 mL Oral Q4H PRN Lamar Sprinkles, MD       amLODipine (NORVASC) tablet 10 mg  10 mg Oral Daily Lamar Sprinkles, MD   10 mg at 11/27/23 1209   aspirin EC tablet 81 mg  81 mg Oral Daily Sarina Ill, DO   81 mg at 11/30/23 0847   donepezil (ARICEPT) tablet 5 mg  5 mg Oral QHS Lamar Sprinkles, MD   5 mg at 11/30/23 2120   fluticasone (FLONASE) 50 MCG/ACT nasal spray 2 spray  2 spray Each Nare Daily PRN Lamar Sprinkles, MD       hydrochlorothiazide (HYDRODIURIL) tablet 12.5 mg  12.5 mg Oral Daily Sarina Ill, DO   12.5 mg at 11/30/23 0847   insulin aspart (novoLOG) injection 0-15 Units  0-15 Units Subcutaneous TID WC Lamar Sprinkles, MD   3 Units at 11/30/23 1654   insulin aspart (novoLOG) injection 0-5 Units  0-5 Units Subcutaneous QHS Lamar Sprinkles, MD   2 Units at 11/26/23 2211   insulin aspart (novoLOG) injection 10 Units  10 Units Subcutaneous TID WC Lamar Sprinkles, MD   10 Units at 12/01/23 0812   insulin glargine-yfgn (SEMGLEE) injection 13 Units  13 Units Subcutaneous QHS Lamar Sprinkles, MD   13 Units at 11/30/23 2136   levothyroxine (SYNTHROID) tablet 50 mcg  50 mcg Oral Q0600 Sarina Ill, DO   50 mcg at  12/01/23 0656   loratadine (CLARITIN) tablet 10 mg  10 mg Oral Daily PRN Lamar Sprinkles, MD       losartan (COZAAR) tablet 100 mg  100 mg Oral Daily Lamar Sprinkles, MD   100 mg at 11/27/23 1209   magnesium hydroxide (MILK OF MAGNESIA) suspension 30 mL  30 mL Oral Daily PRN Lamar Sprinkles, MD       mometasone-formoterol (DULERA) 200-5 MCG/ACT inhaler 2 puff  2 puff Inhalation BID Lamar Sprinkles, MD   2 puff at 11/30/23 2121   OLANZapine zydis (ZYPREXA) disintegrating tablet 5 mg  5 mg Oral TID PRN Lamar Sprinkles, MD       ondansetron (ZOFRAN) tablet 4 mg  4 mg Oral Q6H PRN Lamar Sprinkles, MD       Or   ondansetron (ZOFRAN) injection 4 mg  4 mg Intravenous Q6H PRN Lamar Sprinkles, MD       QUEtiapine (SEROQUEL) tablet 50 mg  50 mg Oral QHS Sarina Ill, DO   50 mg at 11/30/23 2120   rosuvastatin (CRESTOR) tablet 20 mg  20 mg Oral Daily Lamar Sprinkles, MD   20 mg at 11/30/23 0847   senna-docusate (Senokot-S) tablet 1 tablet  1 tablet Oral QHS PRN Lamar Sprinkles, MD       sertraline (ZOLOFT) tablet 100 mg  100 mg Oral Daily Lamar Sprinkles, MD  100 mg at 11/30/23 0847   PTA Medications: Medications Prior to Admission  Medication Sig Dispense Refill Last Dose/Taking   albuterol (PROVENTIL HFA;VENTOLIN HFA) 108 (90 Base) MCG/ACT inhaler Inhale 1-2 puffs into the lungs every 6 (six) hours as needed for wheezing or shortness of breath.       amLODipine (NORVASC) 10 MG tablet Take 10 mg by mouth in the morning.      aspirin 81 MG chewable tablet Chew 1 tablet (81 mg total) by mouth daily.      diclofenac (VOLTAREN) 50 MG EC tablet Take 50 mg by mouth 2 (two) times daily.      donepezil (ARICEPT) 5 MG tablet Take 5 mg by mouth at bedtime.      fluticasone (FLONASE) 50 MCG/ACT nasal spray Place 2 sprays into both nostrils daily as needed for allergies.      hydrochlorothiazide (HYDRODIURIL) 12.5 MG tablet Take 12.5 mg by mouth daily.      Insulin Aspart FlexPen (NOVOLOG) 100 UNIT/ML  Inject 3 Units into the skin 3 (three) times daily with meals.      insulin degludec (TRESIBA) 100 UNIT/ML FlexTouch Pen Inject 10 Units into the skin daily.      Insulin Pen Needle 31G X 5 MM MISC Use to inject insulin once daily 30 each 5    levothyroxine (SYNTHROID) 50 MCG tablet Take 50 mcg by mouth daily before breakfast.      lip balm (CARMEX) ointment Apply topically as needed.      loratadine (CLARITIN) 10 MG tablet Take 1 tablet (10 mg total) by mouth daily as needed for allergies.      losartan (COZAAR) 100 MG tablet Take 1 tablet (100 mg total) by mouth daily. (Patient taking differently: Take 100 mg by mouth at bedtime.)      pioglitazone (ACTOS) 45 MG tablet Take 45 mg by mouth daily.      risperiDONE (RISPERDAL M-TABS) 0.5 MG disintegrating tablet Take 1 tablet (0.5 mg total) by mouth at bedtime.      rosuvastatin (CRESTOR) 20 MG tablet Take 20 mg by mouth at bedtime.      Semaglutide, 2 MG/DOSE, (OZEMPIC, 2 MG/DOSE,) 8 MG/3ML SOPN Inject 0.75 mLs (2 mg total) into the skin every 7 days. 9 mL 1    sertraline (ZOLOFT) 100 MG tablet Take 1 tablet (100 mg total) by mouth daily.      Vitamin D, Ergocalciferol, (DRISDOL) 1.25 MG (50000 UNIT) CAPS capsule Take 50,000 Units by mouth every Sunday. Thursdays       Patient Stressors: Health problems   Marital or family conflict    Patient Strengths: Ability for insight  Capable of independent living  Motivation for treatment/growth  Supportive family/friends   Treatment Modalities: Medication Management, Group therapy, Case management,  1 to 1 session with clinician, Psychoeducation, Recreational therapy.   Physician Treatment Plan for Primary Diagnosis: MDD (major depressive disorder), recurrent, severe, with psychosis (HCC) Long Term Goal(s): Improvement in symptoms so as ready for discharge   Short Term Goals: Ability to identify changes in lifestyle to reduce recurrence of condition will improve Ability to verbalize feelings  will improve Ability to disclose and discuss suicidal ideas Ability to demonstrate self-control will improve Ability to identify and develop effective coping behaviors will improve Ability to maintain clinical measurements within normal limits will improve Compliance with prescribed medications will improve Ability to identify triggers associated with substance abuse/mental health issues will improve  Medication Management: Evaluate patient's response, side effects,  and tolerance of medication regimen.  Therapeutic Interventions: 1 to 1 sessions, Unit Group sessions and Medication administration.  Evaluation of Outcomes: Progressing  Physician Treatment Plan for Secondary Diagnosis: Principal Problem:   MDD (major depressive disorder), recurrent, severe, with psychosis (HCC)  Long Term Goal(s): Improvement in symptoms so as ready for discharge   Short Term Goals: Ability to identify changes in lifestyle to reduce recurrence of condition will improve Ability to verbalize feelings will improve Ability to disclose and discuss suicidal ideas Ability to demonstrate self-control will improve Ability to identify and develop effective coping behaviors will improve Ability to maintain clinical measurements within normal limits will improve Compliance with prescribed medications will improve Ability to identify triggers associated with substance abuse/mental health issues will improve     Medication Management: Evaluate patient's response, side effects, and tolerance of medication regimen.  Therapeutic Interventions: 1 to 1 sessions, Unit Group sessions and Medication administration.  Evaluation of Outcomes: Progressing   RN Treatment Plan for Primary Diagnosis: MDD (major depressive disorder), recurrent, severe, with psychosis (HCC) Long Term Goal(s): Knowledge of disease and therapeutic regimen to maintain health will improve  Short Term Goals:  Ability to remain free from injury will  improve, Ability to verbalize frustration and anger appropriately will improve, Ability to demonstrate self-control, Ability to participate in decision making will improve, Ability to verbalize feelings will improve, Ability to disclose and discuss suicidal ideas, Ability to identify and develop effective coping behaviors will improve, and Compliance with prescribed medications will improve   Medication Management: RN will administer medications as ordered by provider, will assess and evaluate patient's response and provide education to patient for prescribed medication. RN will report any adverse and/or side effects to prescribing provider.  Therapeutic Interventions: 1 on 1 counseling sessions, Psychoeducation, Medication administration, Evaluate responses to treatment, Monitor vital signs and CBGs as ordered, Perform/monitor CIWA, COWS, AIMS and Fall Risk screenings as ordered, Perform wound care treatments as ordered.  Evaluation of Outcomes: Progressing   LCSW Treatment Plan for Primary Diagnosis: MDD (major depressive disorder), recurrent, severe, with psychosis (HCC) Long Term Goal(s): Safe transition to appropriate next level of care at discharge, Engage patient in therapeutic group addressing interpersonal concerns.  Short Term Goals: Engage patient in aftercare planning with referrals and resources, Increase social support, Increase ability to appropriately verbalize feelings, Increase emotional regulation, Facilitate acceptance of mental health diagnosis and concerns, Facilitate patient progression through stages of change regarding substance use diagnoses and concerns, Identify triggers associated with mental health/substance abuse issues, and Increase skills for wellness and recovery   Therapeutic Interventions: Assess for all discharge needs, 1 to 1 time with Social worker, Explore available resources and support systems, Assess for adequacy in community support network, Educate family  and significant other(s) on suicide prevention, Complete Psychosocial Assessment, Interpersonal group therapy.  Evaluation of Outcomes: Progressing   Progress in Treatment: Attending groups: Yes. Participating in groups: Yes. Taking medication as prescribed: Yes. Toleration medication: Yes. Family/Significant other contact made: No, will contact:  CSW will contact if given permission 12/01/23 Update: CSW Contacted patients Aunt w/ patient's consent.. Patient understands diagnosis: Yes. Discussing patient identified problems/goals with staff: Yes. Medical problems stabilized or resolved: Yes. Denies suicidal/homicidal ideation: Yes. Issues/concerns per patient self-inventory: No.12/01/23 Update: None Other: None 12/01/23 Update: None  New problem(s) identified: No, Describe:  None identified 12/01/23 Update: Pt is mildly confused but pleasant.  New Short Term/Long Term Goal(s):elimination of symptoms of psychosis, medication management for mood stabilization; elimination of SI thoughts;  development of comprehensive mental wellness plan. 12/01/23 Update: Goal to remain the same.  Patient Goals:  "Besides getting back to my family, getting back to my life, I have a two bedroom house" 12/01/23 Update: Pt goal to remain the same.    Discharge Plan or Barriers: CSW will assist with appropriate discharge planning  12/01/23 Update: Discharge plan to remain the same.  Reason for Continuation of Hospitalization: Depression Medication stabilization 12/01/23 Update: Depression Medication stabilization.  Estimated Length of Stay:1 to 7 days 12/01/23 Update: TBD  Last 3 [place] Suicide Severity Risk Score: Flowsheet Row Admission (Current) from 11/25/2023 in [facility] to Hosp-Admission (Discharged) from 11/16/2023 in [facility] ED from 08/09/2023 in [facility] Department at [facility]  C-SSRS RISK CATEGORY No  Risk No Risk No Risk       Last PHQ 2/9 Scores:    08/13/2020    4:59 PM 08/04/2020    3:16 PM 12/15/2015    7:07 PM  Depression screen PHQ 2/9  Decreased Interest 1 1 0  Down, Depressed, Hopeless 2 2 0  PHQ - 2 Score 3 3 0  Altered sleeping 3 3   Tired, decreased energy 2 2   Change in appetite 2 2   Feeling bad or failure about yourself  1 1   Trouble concentrating 2 2   Moving slowly or fidgety/restless 2 2   Suicidal thoughts 0 0   PHQ-9 Score 15 15   Difficult doing work/chores Somewhat difficult Somewhat difficult     Scribe for Treatment Team: Lowry Ram, LCSW 12/01/2023 10:13 AM

## 2023-12-01 NOTE — Group Note (Signed)
Date:  12/01/2023 Time:  4:26 PM  Group Topic/Focus:  Healthy Communication:   The focus of this group is to discuss communication, barriers to communication, as well as healthy ways to communicate with others. Self Care:   The focus of this group is to help patients understand the importance of self-care in order to improve or restore emotional, physical, spiritual, interpersonal, and financial health.    Participation Level:  Active  Participation Quality:  Appropriate  Affect:  Appropriate  Cognitive:  Appropriate  Insight: Appropriate  Engagement in Group:  Engaged  Modes of Intervention:  Activity  Additional Comments:    Natasha Chandler 12/01/2023, 4:26 PM

## 2023-12-01 NOTE — Group Note (Signed)
Date:  12/01/2023 Time:  1:23 AM  Group Topic/Focus:  Wrap-Up Group:   The focus of this group is to help patients review their daily goal of treatment and discuss progress on daily workbooks.    Participation Level:  Active  Participation Quality:  Appropriate  Affect:  Appropriate  Cognitive:  Alert  Insight: Improving  Engagement in Group:  Improving  Modes of Intervention:  Discussion  Additional Comments:    [name] 12/01/2023, 1:23 AM

## 2023-12-01 NOTE — Group Note (Signed)
LCSW Group Therapy Note  Group Date: 12/01/2023 Start Time: 1300 End Time: 1340   Type of Therapy and Topic:  Group Therapy - Healthy vs Unhealthy Coping Skills  Participation Level:  Patient was Active throughout Group Therapy  Description of Group The focus of this group was to determine what unhealthy coping techniques typically are used by group members and what healthy coping techniques would be helpful in coping with various problems. Patients were guided in becoming aware of the differences between healthy and unhealthy coping techniques. Patients were asked to identify 2-3 healthy coping skills they would like to learn to use more effectively.  Therapeutic Goals Patients learned that coping is what human beings do all day long to deal with various situations in their lives Patients defined and discussed healthy vs unhealthy coping techniques Patients identified their preferred coping techniques and identified whether these were healthy or unhealthy Patients determined 2-3 healthy coping skills they would like to become more familiar with and use more often. Patients provided support and ideas to each other   Summary of Patient Progress:  During group, Natasha Chandler was very engaged and expressed using distraction and emotional release as positive Coping Skills. Patient proved open to input from peers and feedback from CSW. Patient demonstrated good insight into the subject matter, was respectful of peers, and participated throughout the entire session.   Therapeutic Modalities Cognitive Behavioral Therapy Motivational Interviewing  Azucena Kuba, LCSWA 12/01/2023  2:09 PM

## 2023-12-02 DIAGNOSIS — F333 Major depressive disorder, recurrent, severe with psychotic symptoms: Secondary | ICD-10-CM | POA: Diagnosis not present

## 2023-12-02 LAB — GLUCOSE, CAPILLARY
Glucose-Capillary: 147 mg/dL — ABNORMAL HIGH (ref 70–99)
Glucose-Capillary: 175 mg/dL — ABNORMAL HIGH (ref 70–99)
Glucose-Capillary: 139 mg/dL — ABNORMAL HIGH (ref 70–99)
Glucose-Capillary: 124 mg/dL — ABNORMAL HIGH (ref 70–99)

## 2023-12-02 MED ORDER — LOPERAMIDE HCL 2 MG PO CAPS
4.0000 mg | ORAL_CAPSULE | Freq: Once | ORAL | Status: AC
  Administered 2023-12-02: 4 mg via ORAL
  Filled 2023-12-02: qty 2

## 2023-12-02 MED ORDER — LOPERAMIDE HCL 2 MG PO CAPS
2.0000 mg | ORAL_CAPSULE | ORAL | Status: DC | PRN
  Administered 2023-12-13: 2 mg via ORAL
  Filled 2023-12-02: qty 1

## 2023-12-02 NOTE — Plan of Care (Signed)
  Problem: Physical Regulation: Goal: Ability to maintain clinical measurements within normal limits will improve Outcome: Progressing   Problem: Safety: Goal: Periods of time without injury will increase Outcome: Progressing   
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
## 2023-12-02 NOTE — Progress Notes (Signed)
Patient admitted IVC on 12.22.24 to [facility] for bizarre behavior and increased confusion. Dx: MDD. Patient denies this report.  Patient denies SI/HI/AVH. She denies anxiety and depression. Patient complained of diarrhea. One time dose of 4 mg loperamide adm. Upon follow up, no further complaints noted.  Q15 minute unit checks in place.

## 2023-12-02 NOTE — Plan of Care (Signed)
  Problem: Education: Goal: Utilization of techniques to improve thought processes will improve Outcome: Progressing Goal: Knowledge of the prescribed therapeutic regimen will improve Outcome: Progressing   Problem: Education: Goal: Ability to state activities that reduce stress will improve Outcome: Progressing   Problem: Education: Goal: Knowledge of [place] [facility] Education information/materials will improve Outcome: Progressing Goal: Emotional status will improve Outcome: Progressing Goal: Mental status will improve Outcome: Progressing Goal: Verbalization of understanding the information provided will improve Outcome: Progressing   Problem: Education: Goal: Knowledge of [place] [facility] Education information/materials will improve Outcome: Progressing   Problem: Education: Goal: Knowledge of [place] [facility] Education information/materials will improve Outcome: Progressing Goal: Emotional status will improve Outcome: Progressing Goal: Mental status will improve Outcome: Progressing Goal: Verbalization of understanding the information provided will improve Outcome: Progressing   Problem: Activity: Goal: Interest or engagement in activities will improve Outcome: Progressing Goal: Sleeping patterns will improve Outcome: Progressing   Problem: Coping: Goal: Ability to verbalize frustrations and anger appropriately will improve Outcome: Progressing Goal: Ability to demonstrate self-control will improve Outcome: Progressing   Problem: Health Behavior/Discharge Planning: Goal: Identification of resources available to assist in meeting health care needs will improve Outcome: Progressing Goal: Compliance with treatment plan for underlying cause of condition will improve Outcome: Progressing   Problem: Physical Regulation: Goal: Ability to maintain clinical measurements within normal limits will improve Outcome: Progressing   Problem: Safety: Goal:  Periods of time without injury will increase Outcome: Progressing

## 2023-12-02 NOTE — Group Note (Signed)
Date:  12/02/2023 Time:  9:09 PM  Group Topic/Focus:  Wrap-Up Group:   The focus of this group is to help patients review their daily goal of treatment and discuss progress on daily workbooks.    Participation Level:  Appropriate  Participation Quality:  Appropriate  Affect:  Appropriate  Cognitive:  Oriented  Insight: Improving  Engagement in Group:  Engaged  Modes of Intervention:  Discussion  Additional Comments:    Natasha Chandler 12/02/2023, 9:09 PM

## 2023-12-03 DIAGNOSIS — F333 Major depressive disorder, recurrent, severe with psychotic symptoms: Secondary | ICD-10-CM | POA: Diagnosis not present

## 2023-12-03 LAB — GLUCOSE, CAPILLARY
Glucose-Capillary: 130 mg/dL — ABNORMAL HIGH (ref 70–99)
Glucose-Capillary: 59 mg/dL — ABNORMAL LOW (ref 70–99)
Glucose-Capillary: 152 mg/dL — ABNORMAL HIGH (ref 70–99)
Glucose-Capillary: 102 mg/dL — ABNORMAL HIGH (ref 70–99)
Glucose-Capillary: 210 mg/dL — ABNORMAL HIGH (ref 70–99)

## 2023-12-03 NOTE — Progress Notes (Signed)
   12/03/23 1048  Psych Admission Type (Psych Patients Only)  Admission Status Involuntary  Psychosocial Assessment  Patient Complaints None  Eye Contact Brief  Facial Expression Flat  Affect Flat  Speech Soft  Interaction Assertive  Motor Activity Pacing;Slow  Appearance/Hygiene Unremarkable  Behavior Characteristics Cooperative  Mood Pleasant  Thought Process  Coherency WDL  Content Preoccupation  Delusions None reported or observed  Perception WDL  Hallucination None reported or observed  Judgment Impaired  Confusion Mild  Danger to Self  Current suicidal ideation? Denies  Agreement Not to Harm Self Yes  Description of Agreement verbal  Danger to Others  Danger to Others None reported or observed

## 2023-12-03 NOTE — Group Note (Signed)
Date:  12/03/2023 Time:  11:51 PM  Group Topic/Focus:  Wrap-Up Group:   The focus of this group is to help patients review their daily goal of treatment and discuss progress on daily workbooks.    Participation Level:  Did Not Attend  Participation Quality:      Affect:      Cognitive:      Insight: None  Engagement in Group:  None  Modes of Intervention:      Additional Comments:    [name] 12/03/2023, 11:51 PM
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
## 2023-12-03 NOTE — Group Note (Signed)
Recreation Therapy Group Note   Group Topic:Health and Wellness  Group Date: 12/03/2023 Start Time: 1400 End Time: 1500 Facilitators: Rosina Lowenstein, LRT, CTRS Location: Courtyard  Group Description: Outdoor Recreation. Patients had the option to play corn hole, ring toss, bowling or listening to music while outside in the courtyard getting fresh air and sunlight. LRT and patients discussed things that they enjoy doing in their free time outside of the hospital. LRT encouraged patients to drink water after being active and getting their heart rate up.   Goal Area(s) Addressed: Patient will identify leisure interests.  Patient will practice healthy decision making. Patient will engage in recreation activity.   Affect/Mood: Appropriate   Participation Level: Active and Engaged   Participation Quality: Independent   Behavior: Appropriate, Calm, and Cooperative   Speech/Thought Process: Coherent   Insight: Good   Judgement: Good   Modes of Intervention: Exploration, Music, Open Conversation, and Rapport Building   Patient Response to Interventions:  Attentive, Engaged, and Receptive   Education Outcome:  Acknowledges education   Clinical Observations/Individualized Feedback: Natasha Chandler was active in their participation of session activities and group discussion. 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 Pt interacted well with LRT and peers duration of session.    Plan: Continue to engage patient in RT group sessions 2-3x/week.   Rosina Lowenstein, LRT, CTRS 12/03/2023 4:46 PM

## 2023-12-03 NOTE — Group Note (Signed)
Date:  12/03/2023 Time:  11:09 AM  Group Topic/Focus:  Movie Group The purpose of this group is to allow patients to have leisure time watching movies while having snacks     Participation Level:  Active  Participation Quality:  Appropriate  Affect:  Appropriate  Cognitive:  Alert and Appropriate  Insight: Appropriate  Engagement in Group:  Engaged  Modes of Intervention:  Activity  Additional Comments:    Marta Antu 12/03/2023, 11:09 AM

## 2023-12-03 NOTE — Plan of Care (Signed)
z

## 2023-12-03 NOTE — Plan of Care (Signed)
   Pt. Was compliant with treatment this shift. No issues reported.

## 2023-12-03 NOTE — Inpatient Diabetes Management (Signed)
Inpatient Diabetes Program Recommendations  AACE/ADA: New Consensus Statement on Inpatient Glycemic Control (2015)  Target Ranges:  Prepandial:   less than 140 mg/dL      Peak postprandial:   less than 180 mg/dL (1-2 hours)      Critically ill patients:  140 - 180 mg/dL   Lab Results  Component Value Date   GLUCAP 152 (H) 12/03/2023   HGBA1C 9.4 (H) 11/17/2023    Latest Reference Range & Units 12/01/23 16:31 12/01/23 20:28 12/02/23 07:46 12/02/23 11:16 12/02/23 16:17 12/02/23 19:21 12/03/23 07:43 12/03/23 11:55 12/03/23 12:34  Glucose-Capillary 70 - 99 mg/dL 295 (H) 78 188 (H) 416 (H) 139 (H) 124 (H) 130 (H) 59 (L) 152 (H)  (H): Data is abnormally high (L): Data is abnormally low  Inpatient Diabetes Program Recommendations:   Patient had hypoglycemia post correction + meal  coverage. Please consider: -Decrease Novolog correction to 0-9 units tid, 0-5 units hs  Thank you, Darel Hong E. Venancio Chenier, RN, MSN, CDCES  Diabetes Coordinator Inpatient Glycemic Control Team Team Pager [phone] (8am-5pm) 12/03/2023 1:16 PM

## 2023-12-04 DIAGNOSIS — F333 Major depressive disorder, recurrent, severe with psychotic symptoms: Secondary | ICD-10-CM | POA: Diagnosis not present

## 2023-12-04 LAB — GLUCOSE, CAPILLARY
Glucose-Capillary: 123 mg/dL — ABNORMAL HIGH (ref 70–99)
Glucose-Capillary: 319 mg/dL — ABNORMAL HIGH (ref 70–99)
Glucose-Capillary: 90 mg/dL (ref 70–99)
Glucose-Capillary: 203 mg/dL — ABNORMAL HIGH (ref 70–99)
Glucose-Capillary: 70 mg/dL (ref 70–99)
Glucose-Capillary: 205 mg/dL — ABNORMAL HIGH (ref 70–99)

## 2023-12-04 MED ORDER — INSULIN ASPART 100 UNIT/ML IJ SOLN
0.0000 [IU] | Freq: Three times a day (TID) | INTRAMUSCULAR | Status: DC
Start: 2023-12-04 — End: 2024-01-11
  Administered 2023-12-04: 7 [IU] via SUBCUTANEOUS
  Administered 2023-12-05: 1 [IU] via SUBCUTANEOUS
  Administered 2023-12-05: 2 [IU] via SUBCUTANEOUS
  Administered 2023-12-06: 3 [IU] via SUBCUTANEOUS
  Administered 2023-12-06 – 2023-12-07 (×2): 2 [IU] via SUBCUTANEOUS
  Administered 2023-12-07 – 2023-12-08 (×3): 1 [IU] via SUBCUTANEOUS
  Administered 2023-12-08: 2 [IU] via SUBCUTANEOUS
  Administered 2023-12-08: 1 [IU] via SUBCUTANEOUS
  Administered 2023-12-09 – 2023-12-10 (×5): 2 [IU] via SUBCUTANEOUS
  Administered 2023-12-11: 1 [IU] via SUBCUTANEOUS
  Administered 2023-12-11 – 2023-12-12 (×2): 2 [IU] via SUBCUTANEOUS
  Administered 2023-12-12: 5 [IU] via SUBCUTANEOUS
  Administered 2023-12-13: 3 [IU] via SUBCUTANEOUS
  Administered 2023-12-13: 2 [IU] via SUBCUTANEOUS
  Administered 2023-12-14: 1 [IU] via SUBCUTANEOUS
  Administered 2023-12-14: 3 [IU] via SUBCUTANEOUS
  Administered 2023-12-14 – 2023-12-15 (×2): 2 [IU] via SUBCUTANEOUS
  Administered 2023-12-15 – 2023-12-16 (×3): 1 [IU] via SUBCUTANEOUS
  Administered 2023-12-16 – 2023-12-19 (×6): 2 [IU] via SUBCUTANEOUS
  Administered 2023-12-20: 3 [IU] via SUBCUTANEOUS
  Administered 2023-12-21 – 2023-12-23 (×4): 1 [IU] via SUBCUTANEOUS
  Administered 2023-12-23: 2 [IU] via SUBCUTANEOUS
  Administered 2023-12-24: 1 [IU] via SUBCUTANEOUS
  Administered 2023-12-24: 2 [IU] via SUBCUTANEOUS
  Administered 2023-12-25: 1 [IU] via SUBCUTANEOUS
  Administered 2023-12-25: 2 [IU] via SUBCUTANEOUS
  Administered 2023-12-26: 1 [IU] via SUBCUTANEOUS
  Administered 2023-12-26: 2 [IU] via SUBCUTANEOUS
  Administered 2023-12-26: 3 [IU] via SUBCUTANEOUS
  Administered 2023-12-27: 1 [IU] via SUBCUTANEOUS
  Administered 2023-12-27 – 2023-12-28 (×3): 2 [IU] via SUBCUTANEOUS
  Administered 2023-12-28 – 2023-12-29 (×2): 3 [IU] via SUBCUTANEOUS
  Administered 2023-12-29 – 2023-12-30 (×2): 2 [IU] via SUBCUTANEOUS
  Administered 2023-12-30 – 2023-12-31 (×2): 1 [IU] via SUBCUTANEOUS
  Administered 2023-12-31 – 2024-01-01 (×3): 2 [IU] via SUBCUTANEOUS
  Administered 2024-01-02: 1 [IU] via SUBCUTANEOUS
  Administered 2024-01-02: 3 [IU] via SUBCUTANEOUS
  Administered 2024-01-03 – 2024-01-05 (×4): 2 [IU] via SUBCUTANEOUS
  Administered 2024-01-05: 3 [IU] via SUBCUTANEOUS
  Administered 2024-01-06 – 2024-01-07 (×4): 2 [IU] via SUBCUTANEOUS
  Administered 2024-01-08: 3 [IU] via SUBCUTANEOUS
  Administered 2024-01-09: 2 [IU] via SUBCUTANEOUS
  Administered 2024-01-09: 1 [IU] via SUBCUTANEOUS
  Administered 2024-01-10: 2 [IU] via SUBCUTANEOUS
  Administered 2024-01-10: 1 [IU] via SUBCUTANEOUS
  Administered 2024-01-10: 2 [IU] via SUBCUTANEOUS
  Administered 2024-01-11: 1 [IU] via SUBCUTANEOUS
  Filled 2023-12-04 (×13): qty 1

## 2023-12-04 MED ORDER — INSULIN ASPART 100 UNIT/ML IJ SOLN
0.0000 [IU] | Freq: Every day | INTRAMUSCULAR | Status: DC
  Administered 2023-12-05 – 2023-12-13 (×4): 2 [IU] via SUBCUTANEOUS
  Administered 2024-01-01: 3 [IU] via SUBCUTANEOUS
  Administered 2024-01-06 – 2024-01-08 (×2): 2 [IU] via SUBCUTANEOUS
  Filled 2023-12-04 (×6): qty 1

## 2023-12-04 MED ORDER — INSULIN ASPART 100 UNIT/ML IJ SOLN: 8.0000 [IU] | Freq: Three times a day (TID) | INTRAMUSCULAR | Status: DC

## 2023-12-04 MED ORDER — INSULIN ASPART 100 UNIT/ML IJ SOLN
0.0000 [IU] | Freq: Three times a day (TID) | INTRAMUSCULAR | Status: DC
Start: 2023-12-04 — End: 2023-12-04

## 2023-12-04 MED ORDER — INSULIN ASPART 100 UNIT/ML IJ SOLN
8.0000 [IU] | Freq: Three times a day (TID) | INTRAMUSCULAR | Status: DC
  Administered 2023-12-04 – 2024-01-11 (×104): 8 [IU] via SUBCUTANEOUS
  Filled 2023-12-04 (×33): qty 1

## 2023-12-04 NOTE — Plan of Care (Signed)
  Problem: Education: Goal: Knowledge of the prescribed therapeutic regimen will improve Outcome: Progressing   Problem: Education: Goal: Ability to state activities that reduce stress will improve Outcome: Progressing

## 2023-12-04 NOTE — Progress Notes (Signed)
 Patient is pleasant and cooperative.  Mild confusion.  Anxious affect. Denies SI/HI and AVH.  Denies pain.    Compliant with scheduled medications.  15 min checks in place for safety.  Patient is present in the milieu.  Often observed walking in the hallways.  Appropriate interaction with peers and staff.

## 2023-12-04 NOTE — Group Note (Signed)
 [facility] LCSW Group Therapy Note   Group Date: 12/04/2023 Start Time: 1300 End Time: 1330   Type of Therapy/Topic:  Group Therapy:  Emotion Regulation  Participation Level:  Active   Mood:  Description of Group:    The purpose of this group is to assist patients in learning to regulate negative emotions and experience positive emotions. Patients will be guided to discuss ways in which they have been vulnerable to their negative emotions. These vulnerabilities will be juxtaposed with experiences of positive emotions or situations, and patients challenged to use positive emotions to combat negative ones. Special emphasis will be placed on coping with negative emotions in conflict situations, and patients will process healthy conflict resolution skills.  Therapeutic Goals: Patient will identify two positive emotions or experiences to reflect on in order to balance out negative emotions:  Patient will label two or more emotions that they find the most difficult to experience:  Patient will be able to demonstrate positive conflict resolution skills through discussion or role plays:   Summary of Patient Progress:   Pt able to identify negative emotions    Therapeutic Modalities:   Cognitive Behavioral Therapy Feelings Identification Dialectical Behavioral Therapy   Lum JONETTA Croft, LCSWA

## 2023-12-04 NOTE — Progress Notes (Signed)
D: Pt alert and oriented. Pt denies experiencing any anxiety/depression at this time. Pt denies experiencing any pain at this time. Pt denies experiencing any SI/HI, or AVH at this time.    A: Scheduled medications administered to pt, per MD orders. Support and encouragement provided. Frequent verbal contact made. Routine safety checks conducted q15 minutes.   R: No adverse drug reactions noted. Pt verbally contracts for safety at this time. Pt complaint with medications. Pt interacts minimally with others on the unit. Pt remains safe at this time.  

## 2023-12-04 NOTE — Inpatient Diabetes Management (Signed)
 Inpatient Diabetes Program Recommendations  AACE/ADA: New Consensus Statement on Inpatient Glycemic Control (2015)  Target Ranges:  Prepandial:   less than 140 mg/dL      Peak postprandial:   less than 180 mg/dL (1-2 hours)      Critically ill patients:  140 - 180 mg/dL    Latest Reference Range & Units 12/03/23 07:43 12/03/23 11:55 12/03/23 12:34 12/03/23 16:28 12/03/23 19:55  Glucose-Capillary 70 - 99 mg/dL 869 (H)  12 units Novolog  NOT given until 1020 59 (L) 152 (H) 102 (H)  10 units Novolog   210 (H)  2 units Novolog   13 units Semglee   (H): Data is abnormally high (L): Data is abnormally low     Current Orders: Novolog  Moderate Correction Scale/ SSI (0-15 units) TID AC + HS     Novolog  10 units TID with meals     Semglee  13 units at bedtime     MD- Note Hypoglycemia at 12pm yest   Novolog  SSI and Meal Coverage was given late and this may have contributed to the low  Please consider reducing the Novolog  Meal Coverage to 8 units TID with meals  Reduce the Novolog  SSi to the 0-9 unit Sensitive scale    --Will follow patient during hospitalization--  Adina Rudolpho Arrow RN, MSN, CDCES Diabetes Coordinator Inpatient Glycemic Control Team Team Pager: [phone] (8a-5p)

## 2023-12-04 NOTE — Group Note (Signed)
 Recreation Therapy Group Note   Group Topic:Personal Development  Group Date: 12/04/2023 Start Time: 1400 End Time: 1500 Facilitators: Celestia Jeoffrey BRAVO, LRT, CTRS Location:  Dayroom  Group Description: New Years Eve Handout. LRT and patients talk about the previous year and all that there is to look forward to in the coming year. LRT passes out a handout for patients to write their favorite memory, biggest lesson they've heard, biggest accomplishment, things they want to improve on, and ways to change their world around them. LRT passed out regular sized ink pens for patients to complete their worksheet with. LRT and patients discussed things that they wrote down with the group.   LRT offered to take patients outside after for fresh air and sunlight.   Goal Area(s) Addressed:  Patient will reminisce on previous year.  Patient will recall a past event in their life. Patient will identify goals for the upcoming year.  Patient will increase communication.    Affect/Mood: Appropriate   Participation Level: Active and Engaged   Participation Quality: Independent   Behavior: Appropriate, Calm, and Cooperative   Speech/Thought Process: Coherent   Insight: Good   Judgement: Good   Modes of Intervention: Exploration, Guided Discussion, Worksheet, and Writing   Patient Response to Interventions:  Attentive, Engaged, and Receptive   Education Outcome:  Acknowledges education   Clinical Observations/Individualized Feedback: Natasha Chandler was active in their participation of session activities and group discussion. Pt identified My favorite memory was going to the mountains in [place]  this summer. The limestone was so soft. One thing I want to improve on is understanding words in group settings. Pt chose not to go outside after. Overall, pt interacted well with LRT and peers duration of session.    Plan: Continue to engage patient in RT group sessions 2-3x/week.   Jeoffrey BRAVO Celestia, LRT,  CTRS 12/04/2023 4:34 PM
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
## 2023-12-04 NOTE — Plan of Care (Signed)
  Problem: Safety: Goal: Periods of time without injury will increase Outcome: Progressing   

## 2023-12-05 DIAGNOSIS — F333 Major depressive disorder, recurrent, severe with psychotic symptoms: Secondary | ICD-10-CM | POA: Diagnosis not present

## 2023-12-05 LAB — GLUCOSE, CAPILLARY
Glucose-Capillary: 142 mg/dL — ABNORMAL HIGH (ref 70–99)
Glucose-Capillary: 103 mg/dL — ABNORMAL HIGH (ref 70–99)
Glucose-Capillary: 165 mg/dL — ABNORMAL HIGH (ref 70–99)
Glucose-Capillary: 68 mg/dL — ABNORMAL LOW (ref 70–99)

## 2023-12-05 NOTE — Group Note (Signed)
 Date:  12/05/2023 Time:  10:52 AM  Group Topic/Focus:  Making Healthy Choices:   The focus of this group is to help patients identify negative/unhealthy choices they were using prior to admission and identify positive/healthier coping strategies to replace them upon discharge.    Participation Level:  Active  Participation Quality:  Appropriate  Affect:  Appropriate  Cognitive:  Appropriate  Insight: Appropriate  Engagement in Group:  Engaged  Modes of Intervention:  Discussion   Arland Nutting 12/05/2023, 10:52 AM

## 2023-12-05 NOTE — Plan of Care (Signed)
  Problem: Education: Goal: Utilization of techniques to improve thought processes will improve Outcome: Progressing Goal: Knowledge of the prescribed therapeutic regimen will improve Outcome: Progressing   Problem: Education: Goal: Ability to state activities that reduce stress will improve Outcome: Progressing   Problem: Education: Goal: Knowledge of [place] [facility] Education information/materials will improve Outcome: Progressing Goal: Emotional status will improve Outcome: Progressing Goal: Mental status will improve Outcome: Progressing Goal: Verbalization of understanding the information provided will improve Outcome: Progressing   Problem: Education: Goal: Knowledge of [place] [facility] Education information/materials will improve Outcome: Progressing   Problem: Education: Goal: Knowledge of [place] [facility] Education information/materials will improve Outcome: Progressing Goal: Emotional status will improve Outcome: Progressing Goal: Mental status will improve Outcome: Progressing Goal: Verbalization of understanding the information provided will improve Outcome: Progressing   Problem: Activity: Goal: Interest or engagement in activities will improve Outcome: Progressing Goal: Sleeping patterns will improve Outcome: Progressing   Problem: Coping: Goal: Ability to verbalize frustrations and anger appropriately will improve Outcome: Progressing Goal: Ability to demonstrate self-control will improve Outcome: Progressing   Problem: Health Behavior/Discharge Planning: Goal: Identification of resources available to assist in meeting health care needs will improve Outcome: Progressing Goal: Compliance with treatment plan for underlying cause of condition will improve Outcome: Progressing   Problem: Physical Regulation: Goal: Ability to maintain clinical measurements within normal limits will improve Outcome: Progressing   Problem: Safety: Goal:  Periods of time without injury will increase Outcome: Progressing   Problem: Education: Goal: Ability to describe self-care measures that may prevent or decrease complications (Diabetes Survival Skills Education) will improve Outcome: Progressing Goal: Individualized Educational Video(s) Outcome: Progressing   Problem: Coping: Goal: Ability to adjust to condition or change in health will improve Outcome: Progressing   Problem: Fluid Volume: Goal: Ability to maintain a balanced intake and output will improve Outcome: Progressing   Problem: Health Behavior/Discharge Planning: Goal: Ability to identify and utilize available resources and services will improve Outcome: Progressing Goal: Ability to manage health-related needs will improve Outcome: Progressing   Problem: Metabolic: Goal: Ability to maintain appropriate glucose levels will improve Outcome: Progressing   Problem: Nutritional: Goal: Maintenance of adequate nutrition will improve Outcome: Progressing Goal: Progress toward achieving an optimal weight will improve Outcome: Progressing   Problem: Skin Integrity: Goal: Risk for impaired skin integrity will decrease Outcome: Progressing   Problem: Tissue Perfusion: Goal: Adequacy of tissue perfusion will improve Outcome: Progressing

## 2023-12-05 NOTE — Plan of Care (Signed)
  Problem: Education: Goal: Utilization of techniques to improve thought processes will improve Outcome: Progressing Goal: Knowledge of the prescribed therapeutic regimen will improve Outcome: Progressing   Problem: Education: Goal: Ability to state activities that reduce stress will improve Outcome: Progressing   Problem: Education: Goal: Knowledge of [place] [facility] Education information/materials will improve Outcome: Progressing Goal: Emotional status will improve Outcome: Progressing Goal: Mental status will improve Outcome: Progressing Goal: Verbalization of understanding the information provided will improve Outcome: Progressing   Problem: Education: Goal: Knowledge of [place] [facility] Education information/materials will improve Outcome: Progressing Goal: Emotional status will improve Outcome: Progressing Goal: Mental status will improve Outcome: Progressing Goal: Verbalization of understanding the information provided will improve Outcome: Progressing   Problem: Activity: Goal: Interest or engagement in activities will improve Outcome: Progressing Goal: Sleeping patterns will improve Outcome: Progressing   Problem: Coping: Goal: Ability to verbalize frustrations and anger appropriately will improve Outcome: Progressing Goal: Ability to demonstrate self-control will improve Outcome: Progressing   Problem: Safety: Goal: Periods of time without injury will increase Outcome: Progressing   Problem: Education: Goal: Ability to describe self-care measures that may prevent or decrease complications (Diabetes Survival Skills Education) will improve Outcome: Progressing Goal: Individualized Educational Video(s) Outcome: Progressing

## 2023-12-05 NOTE — Plan of Care (Signed)
   Problem: Education: Goal: Mental status will improve Outcome: Progressing   Problem: Activity: Goal: Interest or engagement in activities will improve Outcome: Progressing

## 2023-12-05 NOTE — Progress Notes (Signed)
 Patient pleasant and cooperative.  Mildly confused.  Anxious affect.  Denies SI/HI and AVH.  Denies depression.  Denis pain.    Compliant with scheduled medications. 15 min checks in place for safety. Patient is present in the milieu. Appropriate interaction with peers and staff.

## 2023-12-05 NOTE — BHH Counselor (Signed)
 CSW contacted [place], [place] [phone] to discuss discharge planning.    Barnie reports that the sheriff's department is supposed to serve pt with guardianship paperwork either tomorrow or Friday.   Barnie reports they have a guardianship court date on the 24th of January for permanent guardianship.   Reports that brought she also brought pt $100 dollars worth of clothing and the nurses are saying that she cannot have it.   CSW informed Barnie that it is not the nurses but unit policy that pt cannot have the clothing because it has strings and hoods.   Barnie reports some concerns about pt coming home, stating If she comes home, she is going to deescalate fairly rapidly, because there's not going to be that 24/7 care that she needs.   CSW informed Barnie that it might be beneficial for pt to have family support after leaving the hospital and that pt does not have many days left given insurance situation.   Barnie reports that pt is switching from [facility] to [facility] and that may be why, she also reports that she is going to drop by pt's place and check her mailbox for the new insurance card and send it to the hospital.   Barnie reports she is concerned about pt being able to manage her medications.   She won't pay attention to us . When we tell her Kierstan, you've got to take your medication, but she won't listen to us   If she's going to rearrange her meds and take them the way she needs to be done, we are fully expecting her at some point to overdose in the future.   She also reports that pt calls the doctors offices and rearranges her appointment or cancels them, which causes Barnie not to be able to take pt to appropriate doctors appointments.   Barnie reports she would also like to hear from provider before pt discharges home. CSW will inform provider.   Lum Croft, MSW, [place] 12/05/2023 12:56 PM

## 2023-12-05 NOTE — Progress Notes (Signed)
 Patient glucose level @hs  was 68 mg/dl, insulin held but documented given in error.
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
## 2023-12-05 NOTE — Progress Notes (Signed)
 Pt is alert but disoriented about situation. Patient denies anxiety and depression at this time.  She denies any SI/HI/AVH as well as pain.   Pt is compliant with medication prescribed by the provider as well as treatment plan. Pt is receiving support and care from staff. Pt stayed the the dayroom and watching TV as well as socializing with peers.  No adverse reactions from medication. Will keep monitoring patients through the rest of the night

## 2023-12-06 DIAGNOSIS — F333 Major depressive disorder, recurrent, severe with psychotic symptoms: Secondary | ICD-10-CM | POA: Diagnosis not present

## 2023-12-06 LAB — GLUCOSE, CAPILLARY
Glucose-Capillary: 152 mg/dL — ABNORMAL HIGH (ref 70–99)
Glucose-Capillary: 160 mg/dL — ABNORMAL HIGH (ref 70–99)
Glucose-Capillary: 228 mg/dL — ABNORMAL HIGH (ref 70–99)
Glucose-Capillary: 98 mg/dL (ref 70–99)
Glucose-Capillary: 166 mg/dL — ABNORMAL HIGH (ref 70–99)

## 2023-12-06 NOTE — Plan of Care (Signed)
   Problem: Coping: Goal: Ability to verbalize frustrations and anger appropriately will improve Outcome: Progressing Goal: Ability to demonstrate self-control will improve Outcome: Progressing

## 2023-12-06 NOTE — Group Note (Signed)
 Date:  12/05/23 Time:  09:00 pm  Group Topic/Focus:  Goals Group:   The focus of this group is to help patients establish daily goals to achieve during treatment and discuss how the patient can incorporate goal setting into their daily lives to aide in recovery.    Participation Level:  Active  Participation Quality:  Appropriate  Affect:  Appropriate  Cognitive:  Appropriate  Insight: Appropriate  Engagement in Group:  Engaged  Modes of Intervention:  Discussion  Additional Comments:    Natasha Chandler 12/05/23, 09:00 pm

## 2023-12-06 NOTE — BHH Counselor (Signed)
 CSW witnessed pt be served guardianship paperwork by [facility], to which pt responded, that whore in reference to her sister for filing for guardianship.   CSW witnessed pt in dayroom following being served as very tearful and upset.   Lum Croft, MSW, [place] 12/06/2023 2:52 PM

## 2023-12-06 NOTE — Group Note (Signed)
 Recreation Therapy Group Note   Group Topic:Other  Group Date: 12/06/2023 Start Time: 1400 End Time: 1450 Facilitators: Celestia Jeoffrey BRAVO, LRT, CTRS Location:  Dayroom  Group Description: Seated Exercise. LRT discussed the mental and physical benefits of exercise. LRT and group discussed how physical activity can be used as a coping skill. Pt's and LRT followed along to an exercise video on the TV screen that provided a visual representation and audio description of every exercise performed. Pt's encouraged to listen to their bodies and stop at any time if they experience feelings of discomfort or pain. Pts were encouraged to drink water and stay hydrated.   Goal Area(s) Addressed: Patient will learn benefits of physical activity. Patient will identify exercise as a coping skill.  Patient will follow multistep directions. Patient will try a new leisure interest.    Affect/Mood: Appropriate   Participation Level: Active and Engaged   Participation Quality: Independent   Behavior: Appropriate and Cooperative   Speech/Thought Process: Coherent   Insight: Good   Judgement: Fair    Modes of Intervention: Activity, Education, Exploration, and Guided Discussion   Patient Response to Interventions:  Attentive, Engaged, and Interested    Education Outcome:  Acknowledges education   Clinical Observations/Individualized Feedback: Natasha Chandler was active in their participation of session activities and group discussion. Pt completed all exercises as shown. Pt mentioned the paperwork and case regarding her sister, pt seemed somewhat preoccupied but was able to be redirected easily.    Plan: Continue to engage patient in RT group sessions 2-3x/week.   [id number], LRT, CTRS 12/06/2023 5:02 PM

## 2023-12-06 NOTE — Inpatient Diabetes Management (Signed)
 Inpatient Diabetes Program Recommendations  AACE/ADA: New Consensus Statement on Inpatient Glycemic Control (2025)  Target Ranges:  Prepandial:   less than 140 mg/dL      Peak postprandial:   less than 180 mg/dL (1-2 hours)      Critically ill patients:  140 - 180 mg/dL   Lab Results  Component Value Date   GLUCAP 228 (H) 12/06/2023   HGBA1C 9.4 (H) 11/17/2023    Review of Glycemic Control  Latest Reference Range & Units 12/05/23 07:09 12/05/23 11:31 12/05/23 15:53 12/05/23 19:39 12/06/23 06:55 12/06/23 07:32 12/06/23 11:28  Glucose-Capillary 70 - 99 mg/dL 857 (H) 896 (H) 834 (H) 68 (L) 152 (H) 160 (H) 228 (H)   Diabetes history: DM Home DM Meds: Tresiba  10 every day Actos  45 mg every day Ozempic  2 mg weekly    Current Orders: Novolog  Moderate Correction Scale/ SSI (0-9 units) TID AC + HS Novolog  8 units TID with meals  Inpatient Diabetes Program Recommendations:    May consider restarting low dose basal insulin  of Semglee  8 units daily.  Also consider reducing Novolog  meal coverage to 6 units tid with meals (hold if patient eats less than 50% or NPO).   Thanks,  Randall Bullocks, RN, BC-ADM Inpatient Diabetes Coordinator Pager [phone]  (8a-5p)

## 2023-12-06 NOTE — Progress Notes (Signed)
 Assumed care of patient this pm, she present A&O x 3, affect seemed anxious and depressed  mood pleasant and cooperative, focusing on returning home. Pt out in the milieu, sociable with peers for a brief period, c/o feeling tired early in the evening behavior  appropriate to situation. Pt denied thoughts, plan or intent to harm self or others and made a safety commitment,   Pt educated on plan of care, medication regimen and she acknowledged an understanding and was without further complaints or concerns. Pt is been maintained on q 15 min rounds for safety and support.
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
## 2023-12-06 NOTE — BH IP Treatment Plan (Signed)
 Interdisciplinary Treatment and Diagnostic Plan Update  12/06/2023 Time of Session: 9:30 AM  Natasha Chandler MRN: [id number]  Principal Diagnosis: MDD (major depressive disorder), recurrent, severe, with psychosis (HCC)  Secondary Diagnoses: Principal Problem:   MDD (major depressive disorder), recurrent, severe, with psychosis (HCC)   Current Medications:  Current Facility-Administered Medications  Medication Dose Route Frequency Provider Last Rate Last Admin   acetaminophen  (TYLENOL ) suppository 650 mg  650 mg Rectal Q6H PRN Rainelle Pfeiffer, MD       albuterol  (PROVENTIL ) (2.5 MG/3ML) 0.083% nebulizer solution 3 mL  3 mL Inhalation Q6H PRN Rainelle Pfeiffer, MD       alum & mag hydroxide-simeth (MAALOX/MYLANTA) 200-200-20 MG/5ML suspension 30 mL  30 mL Oral Q4H PRN Rainelle Pfeiffer, MD       amLODipine  (NORVASC ) tablet 10 mg  10 mg Oral Daily Rainelle Pfeiffer, MD   10 mg at 12/05/23 0831   aspirin  EC tablet 81 mg  81 mg Oral Daily Cam Charlie Loving, DO   81 mg at 12/05/23 0830   donepezil  (ARICEPT ) tablet 5 mg  5 mg Oral QHS Rainelle Pfeiffer, MD   5 mg at 12/05/23 2028   fluticasone  (FLONASE ) 50 MCG/ACT nasal spray 2 spray  2 spray Each Nare Daily PRN Rainelle Pfeiffer, MD       hydrochlorothiazide  (HYDRODIURIL ) tablet 12.5 mg  12.5 mg Oral Daily Cam Charlie Loving, DO   12.5 mg at 12/05/23 0831   insulin  aspart (novoLOG ) injection 0-5 Units  0-5 Units Subcutaneous QHS Cam Charlie Loving, DO   2 Units at 12/05/23 2126   insulin  aspart (novoLOG ) injection 0-9 Units  0-9 Units Subcutaneous TID WC Cam Charlie Loving, DO   2 Units at 12/06/23 9192   insulin  aspart (novoLOG ) injection 8 Units  8 Units Subcutaneous TID WC Cam Charlie Loving, DO   8 Units at 12/06/23 9193   levothyroxine  (SYNTHROID ) tablet 50 mcg  50 mcg Oral Q0600 Cam Charlie Loving, DO   50 mcg at 12/06/23 9365   loperamide  (IMODIUM ) capsule 2 mg  2 mg Oral PRN Parmar, Meenakshi, MD       loratadine   (CLARITIN ) tablet 10 mg  10 mg Oral Daily PRN Rainelle Pfeiffer, MD       losartan  (COZAAR ) tablet 100 mg  100 mg Oral Daily Rainelle Pfeiffer, MD   100 mg at 12/05/23 0830   magnesium  hydroxide (MILK OF MAGNESIA) suspension 30 mL  30 mL Oral Daily PRN Rainelle Pfeiffer, MD       mometasone -formoterol  (DULERA ) 200-5 MCG/ACT inhaler 2 puff  2 puff Inhalation BID Rainelle Pfeiffer, MD   2 puff at 12/06/23 1016   OLANZapine  zydis (ZYPREXA ) disintegrating tablet 5 mg  5 mg Oral TID PRN Rainelle Pfeiffer, MD       ondansetron  (ZOFRAN ) tablet 4 mg  4 mg Oral Q6H PRN Rainelle Pfeiffer, MD       Or   ondansetron  (ZOFRAN ) injection 4 mg  4 mg Intravenous Q6H PRN Rainelle Pfeiffer, MD       QUEtiapine  (SEROQUEL ) tablet 50 mg  50 mg Oral QHS Cam Charlie Loving, DO   50 mg at 12/05/23 2028   rosuvastatin  (CRESTOR ) tablet 20 mg  20 mg Oral Daily Rainelle Pfeiffer, MD   20 mg at 12/05/23 0830   senna-docusate (Senokot-S) tablet 1 tablet  1 tablet Oral QHS PRN Rainelle Pfeiffer, MD       sertraline  (ZOLOFT ) tablet 100 mg  100 mg Oral Daily Rainelle Pfeiffer, MD  100 mg at 12/06/23 1016   PTA Medications: Medications Prior to Admission  Medication Sig Dispense Refill Last Dose/Taking   albuterol  (PROVENTIL  HFA;VENTOLIN  HFA) 108 (90 Base) MCG/ACT inhaler Inhale 1-2 puffs into the lungs every 6 (six) hours as needed for wheezing or shortness of breath.       amLODipine  (NORVASC ) 10 MG tablet Take 10 mg by mouth in the morning.      aspirin  81 MG chewable tablet Chew 1 tablet (81 mg total) by mouth daily.      diclofenac  (VOLTAREN ) 50 MG EC tablet Take 50 mg by mouth 2 (two) times daily.      donepezil  (ARICEPT ) 5 MG tablet Take 5 mg by mouth at bedtime.      fluticasone  (FLONASE ) 50 MCG/ACT nasal spray Place 2 sprays into both nostrils daily as needed for allergies.      hydrochlorothiazide  (HYDRODIURIL ) 12.5 MG tablet Take 12.5 mg by mouth daily.      Insulin  Aspart FlexPen (NOVOLOG ) 100 UNIT/ML Inject 3 Units into the skin  3 (three) times daily with meals.      insulin  degludec (TRESIBA ) 100 UNIT/ML FlexTouch Pen Inject 10 Units into the skin daily.      Insulin  Pen Needle 31G X 5 MM MISC Use to inject insulin  once daily 30 each 5    levothyroxine  (SYNTHROID ) 50 MCG tablet Take 50 mcg by mouth daily before breakfast.      lip balm (CARMEX) ointment Apply topically as needed.      loratadine  (CLARITIN ) 10 MG tablet Take 1 tablet (10 mg total) by mouth daily as needed for allergies.      losartan  (COZAAR ) 100 MG tablet Take 1 tablet (100 mg total) by mouth daily. (Patient taking differently: Take 100 mg by mouth at bedtime.)      pioglitazone  (ACTOS ) 45 MG tablet Take 45 mg by mouth daily.      risperiDONE  (RISPERDAL  M-TABS) 0.5 MG disintegrating tablet Take 1 tablet (0.5 mg total) by mouth at bedtime.      rosuvastatin  (CRESTOR ) 20 MG tablet Take 20 mg by mouth at bedtime.      Semaglutide , 2 MG/DOSE, (OZEMPIC , 2 MG/DOSE,) 8 MG/3ML SOPN Inject 0.75 mLs (2 mg total) into the skin every 7 days. 9 mL 1    sertraline  (ZOLOFT ) 100 MG tablet Take 1 tablet (100 mg total) by mouth daily.      Vitamin D , Ergocalciferol , (DRISDOL) 1.25 MG (50000 UNIT) CAPS capsule Take 50,000 Units by mouth every Sunday. Thursdays       Patient Stressors: Health problems   Marital or family conflict    Patient Strengths: Ability for insight  Capable of independent living  Motivation for treatment/growth  Supportive family/friends   Treatment Modalities: Medication Management, Group therapy, Case management,  1 to 1 session with clinician, Psychoeducation, Recreational therapy.   Physician Treatment Plan for Primary Diagnosis: MDD (major depressive disorder), recurrent, severe, with psychosis (HCC) Long Term Goal(s): Improvement in symptoms so as ready for discharge   Short Term Goals: Ability to identify changes in lifestyle to reduce recurrence of condition will improve Ability to verbalize feelings will improve Ability to  disclose and discuss suicidal ideas Ability to demonstrate self-control will improve Ability to identify and develop effective coping behaviors will improve Ability to maintain clinical measurements within normal limits will improve Compliance with prescribed medications will improve Ability to identify triggers associated with substance abuse/mental health issues will improve  Medication Management: Evaluate patient's response, side effects,  and tolerance of medication regimen.  Therapeutic Interventions: 1 to 1 sessions, Unit Group sessions and Medication administration.  Evaluation of Outcomes: Progressing  Physician Treatment Plan for Secondary Diagnosis: Principal Problem:   MDD (major depressive disorder), recurrent, severe, with psychosis (HCC)  Long Term Goal(s): Improvement in symptoms so as ready for discharge   Short Term Goals: Ability to identify changes in lifestyle to reduce recurrence of condition will improve Ability to verbalize feelings will improve Ability to disclose and discuss suicidal ideas Ability to demonstrate self-control will improve Ability to identify and develop effective coping behaviors will improve Ability to maintain clinical measurements within normal limits will improve Compliance with prescribed medications will improve Ability to identify triggers associated with substance abuse/mental health issues will improve     Medication Management: Evaluate patient's response, side effects, and tolerance of medication regimen.  Therapeutic Interventions: 1 to 1 sessions, Unit Group sessions and Medication administration.  Evaluation of Outcomes: Progressing   RN Treatment Plan for Primary Diagnosis: MDD (major depressive disorder), recurrent, severe, with psychosis (HCC) Long Term Goal(s): Knowledge of disease and therapeutic regimen to maintain health will improve  Short Term Goals: Ability to remain free from injury will improve, Ability to  verbalize frustration and anger appropriately will improve, Ability to demonstrate self-control, Ability to participate in decision making will improve, Ability to verbalize feelings will improve, Ability to disclose and discuss suicidal ideas, Ability to identify and develop effective coping behaviors will improve, and Compliance with prescribed medications will improve  Medication Management: RN will administer medications as ordered by provider, will assess and evaluate patient's response and provide education to patient for prescribed medication. RN will report any adverse and/or side effects to prescribing provider.  Therapeutic Interventions: 1 on 1 counseling sessions, Psychoeducation, Medication administration, Evaluate responses to treatment, Monitor vital signs and CBGs as ordered, Perform/monitor CIWA, COWS, AIMS and Fall Risk screenings as ordered, Perform wound care treatments as ordered.  Evaluation of Outcomes: Progressing   LCSW Treatment Plan for Primary Diagnosis: MDD (major depressive disorder), recurrent, severe, with psychosis (HCC) Long Term Goal(s): Safe transition to appropriate next level of care at discharge, Engage patient in therapeutic group addressing interpersonal concerns.  Short Term Goals: Engage patient in aftercare planning with referrals and resources, Increase social support, Increase ability to appropriately verbalize feelings, Increase emotional regulation, Facilitate acceptance of mental health diagnosis and concerns, Facilitate patient progression through stages of change regarding substance use diagnoses and concerns, Identify triggers associated with mental health/substance abuse issues, and Increase skills for wellness and recovery  Therapeutic Interventions: Assess for all discharge needs, 1 to 1 time with Social worker, Explore available resources and support systems, Assess for adequacy in community support network, Educate family and significant other(s)  on suicide prevention, Complete Psychosocial Assessment, Interpersonal group therapy.  Evaluation of Outcomes: Progressing    Progress in Treatment: Attending groups: Yes. Participating in groups: Yes. Taking medication as prescribed: Yes. Toleration medication: Yes. Family/Significant other contact made: No, will contact:  CSW will contact if given permission 12/01/23 Update: CSW Contacted patients Aunt w/ patient's consent.. Patient understands diagnosis: Yes. Discussing patient identified problems/goals with staff: Yes. Medical problems stabilized or resolved: Yes. Denies suicidal/homicidal ideation: Yes. Issues/concerns per patient self-inventory: No.12/01/23 Update: None Other: None 12/01/23 Update: None  New problem(s) identified: No, Describe:  None identified 12/01/23 Update: Pt is mildly confused but pleasant. Update 12/06/23: No changes at this time    New Short Term/Long Term Goal(s):elimination of symptoms of psychosis, medication management  for mood stabilization; elimination of SI thoughts; development of comprehensive mental wellness plan. 12/01/23 Update: Goal to remain the same. Update 12/06/23: No changes at this time    Patient Goals:  Besides getting back to my family, getting back to my life, I have a two bedroom house 12/01/23 Update: Pt goal to remain the same. Update 12/06/23: No changes at this time      Discharge Plan or Barriers: CSW will assist with appropriate discharge planning  12/01/23 Update: Discharge plan to remain the same. Update 12/06/23: Pt's sister is getting guardianship of pt and has a court date on 12/28/23.    Reason for Continuation of Hospitalization: Depression Medication stabilization 12/01/23 Update: Depression Medication stabilization. Update 12/06/23: No changes at this time    Estimated Length of Stay:1 to 7 days 12/01/23 Update: TBD Update 12/06/23: TBD    Last 3 [place] Suicide Severity Risk Score: Flowsheet Row Admission (Current) from  11/25/2023 in [facility] to Hosp-Admission (Discharged) from 11/16/2023 in [facility] from 08/09/2023 in [facility] at [facility]  C-SSRS RISK CATEGORY No Risk No Risk No Risk       Last PHQ 2/9 Scores:    08/13/2020    4:59 PM 08/04/2020    3:16 PM 12/15/2015    7:07 PM  Depression screen PHQ 2/9  Decreased Interest 1 1 0  Down, Depressed, Hopeless 2 2 0  PHQ - 2 Score 3 3 0  Altered sleeping 3 3   Tired, decreased energy 2 2   Change in appetite 2 2   Feeling bad or failure about yourself  1 1   Trouble concentrating 2 2   Moving slowly or fidgety/restless 2 2   Suicidal thoughts 0 0   PHQ-9 Score 15 15   Difficult doing work/chores Somewhat difficult Somewhat difficult     Scribe for Treatment Team: Natasha Chandler, Natasha Chandler 12/06/2023 10:17 AM

## 2023-12-06 NOTE — Progress Notes (Signed)
 Patient admitted IVC on 12.22.24 to [facility] for bizarre behavior and increased confusion. Dx: MDD. Patient denies this report.  Patient denies SI/HI/AVH. She denies anxiety and depression. Patient actively engaged in rec therapy group and appropriately interacted with her peers.   After being served guardianship papers, patient became tearful in the dayroom. She always does this. Patient was referring to her sister. Support and encouragement provided.  Q15 minute unit checks in place.

## 2023-12-07 DIAGNOSIS — F333 Major depressive disorder, recurrent, severe with psychotic symptoms: Secondary | ICD-10-CM | POA: Diagnosis not present

## 2023-12-07 LAB — GLUCOSE, CAPILLARY
Glucose-Capillary: 140 mg/dL — ABNORMAL HIGH (ref 70–99)
Glucose-Capillary: 200 mg/dL — ABNORMAL HIGH (ref 70–99)
Glucose-Capillary: 146 mg/dL — ABNORMAL HIGH (ref 70–99)
Glucose-Capillary: 167 mg/dL — ABNORMAL HIGH (ref 70–99)

## 2023-12-07 NOTE — Group Note (Signed)
 Recreation Therapy Group Note   Group Topic:General Recreation  Group Date: 12/07/2023 Start Time: 1400 End Time: 1450 Facilitators: Celestia Jeoffrey BRAVO, LRT, CTRS Location:  Dayroom  Group Description: Bingo. LRT and patients played multiple games of Bingo with music playing in the background. LRT and pts discussed how this could be a leisure interest and the importance of doing things they enjoy post-discharge. Pts won stress balls as chief financial officer.   Goal Area(s) Addressed: Patient will identify leisure interests.  Patient will practice healthy decision making. Patient will engage in recreation activity.  Patient will increase communication.    Affect/Mood: Appropriate   Participation Level: Active and Engaged   Participation Quality: Independent   Behavior: Appropriate, Calm, and Cooperative   Speech/Thought Process: Coherent   Insight: Good   Judgement: Good   Modes of Intervention: Activity, Cooperative Play, Music, and Rapport Building   Patient Response to Interventions:  Attentive and Engaged   Education Outcome:  Acknowledges education   Clinical Observations/Individualized Feedback: Corbyn was active in their participation of session activities and group discussion. Pt shared that she had never played bingo before and didn't know how to play. LRT taught pt how to play. Pt won a facilities manager and appeared to be happy that she learned something new. Pt interacted well with LRT and peers duration of session.    Plan: Continue to engage patient in RT group sessions 2-3x/week.   Jeoffrey BRAVO Celestia, LRT, CTRS 12/07/2023 4:32 PM

## 2023-12-07 NOTE — Progress Notes (Signed)
   12/07/23 1000  Psych Admission Type (Psych Patients Only)  Admission Status Involuntary  Psychosocial Assessment  Patient Complaints Depression  Eye Contact Fair  Facial Expression Animated  Affect Anxious  Speech Logical/coherent  Interaction Assertive  Motor Activity Restless  Appearance/Hygiene Unremarkable  Behavior Characteristics Cooperative  Mood Pleasant  Thought Process  Coherency WDL  Content Preoccupation  Delusions None reported or observed  Perception WDL  Hallucination None reported or observed  Judgment Impaired  Confusion Mild  Danger to Self  Current suicidal ideation? Denies  Agreement Not to Harm Self Yes  Description of Agreement verbal  Danger to Others  Danger to Others None reported or observed

## 2023-12-07 NOTE — Group Note (Signed)
 Date:  12/07/2023 Time:  10:47 PM  Group Topic/Focus:  Wrap-Up Group:   The focus of this group is to help patients review their daily goal of treatment and discuss progress on daily workbooks.    Participation Level:  Minimal  Participation Quality:  Appropriate  Affect:  Appropriate  Cognitive:  Appropriate  Insight: Improving  Engagement in Group:  Engaged  Modes of Intervention:  Discussion  Additional Comments:    Natasha Chandler 12/07/2023, 10:47 PM

## 2023-12-07 NOTE — Plan of Care (Signed)
  Problem: Education: Goal: Utilization of techniques to improve thought processes will improve Outcome: Progressing Goal: Knowledge of the prescribed therapeutic regimen will improve Outcome: Progressing   Problem: Education: Goal: Ability to state activities that reduce stress will improve Outcome: Progressing   Problem: Education: Goal: Knowledge of [place] [facility] Education information/materials will improve Outcome: Progressing Goal: Emotional status will improve Outcome: Progressing Goal: Mental status will improve Outcome: Progressing Goal: Verbalization of understanding the information provided will improve Outcome: Progressing   Problem: Education: Goal: Knowledge of [place] [facility] Education information/materials will improve Outcome: Progressing   Problem: Education: Goal: Knowledge of [place] [facility] Education information/materials will improve Outcome: Progressing Goal: Emotional status will improve Outcome: Progressing Goal: Mental status will improve Outcome: Progressing Goal: Verbalization of understanding the information provided will improve Outcome: Progressing   Problem: Activity: Goal: Interest or engagement in activities will improve Outcome: Progressing Goal: Sleeping patterns will improve Outcome: Progressing   Problem: Coping: Goal: Ability to verbalize frustrations and anger appropriately will improve Outcome: Progressing Goal: Ability to demonstrate self-control will improve Outcome: Progressing   Problem: Health Behavior/Discharge Planning: Goal: Identification of resources available to assist in meeting health care needs will improve Outcome: Progressing Goal: Compliance with treatment plan for underlying cause of condition will improve Outcome: Progressing   Problem: Physical Regulation: Goal: Ability to maintain clinical measurements within normal limits will improve Outcome: Progressing   Problem: Safety: Goal:  Periods of time without injury will increase Outcome: Progressing   Problem: Education: Goal: Ability to describe self-care measures that may prevent or decrease complications (Diabetes Survival Skills Education) will improve Outcome: Progressing Goal: Individualized Educational Video(s) Outcome: Progressing   Problem: Coping: Goal: Ability to adjust to condition or change in health will improve Outcome: Progressing   Problem: Fluid Volume: Goal: Ability to maintain a balanced intake and output will improve Outcome: Progressing   Problem: Health Behavior/Discharge Planning: Goal: Ability to identify and utilize available resources and services will improve Outcome: Progressing Goal: Ability to manage health-related needs will improve Outcome: Progressing   Problem: Metabolic: Goal: Ability to maintain appropriate glucose levels will improve Outcome: Progressing   Problem: Nutritional: Goal: Maintenance of adequate nutrition will improve Outcome: Progressing Goal: Progress toward achieving an optimal weight will improve Outcome: Progressing   Problem: Skin Integrity: Goal: Risk for impaired skin integrity will decrease Outcome: Progressing   Problem: Tissue Perfusion: Goal: Adequacy of tissue perfusion will improve Outcome: Progressing

## 2023-12-07 NOTE — BHH Counselor (Signed)
 CSW spoke with the patient's aunt, Avalin Briley, [phone]) at patient's request.   Pt requested that CSW contact her aunt as her aunt and her sister have developed a plan for patient to have supervision around the clock, allowing her to return to the home.   Per the patient's aunt there is NO plan for patient to return at this time.  Aunt reports that patient's insurance would have to cover whatever 24/7 supervision to allow patient to return to the home.  Aunt report that patient was changing her insurance from [facility] to [employer].  Pt's aunt reports concerns of the patient being alone at her home.   She reports that patient was diagnosed with dementia by her PCP with the [facility] health [facility].  She reports that she will ask the patient's sister to send the paperwork to this clinician when she can.  Aunt also reports that patient has stated that she did not receive the clothing that she brought the patient.  CSW explained that while this clinical research associate is unaware of specific barriers to pt receiving the clothing, the main barrier for anyone receiving clothing is if the clothing had strings in it.    Sherryle Margo, MSW, LCSW 12/07/2023 3:46 PM

## 2023-12-07 NOTE — Plan of Care (Signed)
   Problem: Nutritional: Goal: Maintenance of adequate nutrition will improve Outcome: Progressing   Problem: Skin Integrity: Goal: Risk for impaired skin integrity will decrease Outcome: Progressing
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
## 2023-12-07 NOTE — Group Note (Signed)
 Date:  12/07/2023 Time:  1:07 AM  Group Topic/Focus:  Coping With Mental Health Crisis:   The purpose of this group is to help patients identify strategies for coping with mental health crisis.  Group discusses possible causes of crisis and ways to manage them effectively.    Participation Level:  Active  Participation Quality:  Appropriate  Affect:  Appropriate  Cognitive:  Appropriate  Insight: Appropriate  Engagement in Group:  Engaged  Modes of Intervention:  Education  Additional Comments:    Laymon ONEIDA Finder 12/07/2023, 1:07 AM

## 2023-12-08 DIAGNOSIS — F333 Major depressive disorder, recurrent, severe with psychotic symptoms: Secondary | ICD-10-CM | POA: Diagnosis not present

## 2023-12-08 LAB — GLUCOSE, CAPILLARY
Glucose-Capillary: 173 mg/dL — ABNORMAL HIGH (ref 70–99)
Glucose-Capillary: 124 mg/dL — ABNORMAL HIGH (ref 70–99)
Glucose-Capillary: 135 mg/dL — ABNORMAL HIGH (ref 70–99)
Glucose-Capillary: 103 mg/dL — ABNORMAL HIGH (ref 70–99)

## 2023-12-08 NOTE — Plan of Care (Signed)
  Problem: Education: Goal: Utilization of techniques to improve thought processes will improve Outcome: Progressing   Problem: Education: Goal: Ability to state activities that reduce stress will improve Outcome: Progressing   Problem: Education: Goal: Utilization of techniques to improve thought processes will improve 12/08/2023 0013 by Ann Orlean LABOR, RN Outcome: Progressing 12/08/2023 0013 by Ann Orlean LABOR, RN Outcome: Progressing 12/08/2023 0012 by Ann Orlean LABOR, RN Outcome: Progressing Goal: Knowledge of the prescribed therapeutic regimen will improve 12/08/2023 0013 by Ann Orlean LABOR, RN Outcome: Progressing 12/08/2023 0013 by Ann Orlean LABOR, RN Outcome: Progressing
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
## 2023-12-08 NOTE — Progress Notes (Signed)
  Assumed care of patient this pm, she present A&O x 3, affect seemed anxious and depressed  mood pleasant and cooperative, she was evasive around issues and seemed to be forgetful and had periods were she seemed to had ro collect her thoughts.. Pt out in the milieu, sociable with peers for a brief period, c/o feeling tired early in the evening, behavior appropriate to situation. Pt denied thoughts, plan or intent to harm self or others and made a safety commitment,   Pt educated on plan of care, medication regimen and she acknowledged an understanding and was without further complaints or concerns. Pt is been maintained on q 15 min rounds for safety and support.

## 2023-12-08 NOTE — Group Note (Unsigned)
 Date:  12/08/2023 Time:  4:08 PM  Group Topic/Focus:  Self Care:   The focus of this group is to help patients understand the importance of self-care in order to improve or restore emotional, physical, spiritual, interpersonal, and financial health.     Participation Level:  {BHH PARTICIPATION OZCZO:77735}  Participation Quality:  {BHH PARTICIPATION QUALITY:22265}  Affect:  {BHH AFFECT:22266}  Cognitive:  {BHH COGNITIVE:22267}  Insight: {BHH Insight2:20797}  Engagement in Group:  {BHH ENGAGEMENT IN HMNLE:77731}  Modes of Intervention:  {BHH MODES OF INTERVENTION:22269}  Additional Comments:  ***  Kasey Ewings 12/08/2023, 4:08 PM

## 2023-12-08 NOTE — Progress Notes (Signed)
 Patient pleasant and cooperative.  Animated affect.  Mild confusion. Endorses depression.  Denies SI/HI and  AVH.  Denies anxiety.  Denis pain.  Reports her sleep was fair last night.   Compliant with scheduled medications.  15 min checks in place for safety.  Patient is present in the milieu.  Appropriate interaction with peers and staff.

## 2023-12-08 NOTE — Group Note (Signed)
 Date:  12/08/2023 Time:  8:47 PM  Group Topic/Focus:  Recovery Goals:   The focus of this group is to identify appropriate goals for recovery and establish a plan to achieve them. Self Care:   The focus of this group is to help patients understand the importance of self-care in order to improve or restore emotional, physical, spiritual, interpersonal, and financial health.    Participation Level:  Active  Participation Quality:  Appropriate  Affect:  Appropriate  Cognitive:  Appropriate  Insight: Appropriate  Engagement in Group:  Engaged  Modes of Intervention:  Discussion  Additional Comments:    Kristen VEAR Gibbon 12/08/2023, 8:47 PM

## 2023-12-08 NOTE — Plan of Care (Signed)
  Problem: Education: Goal: Knowledge of the prescribed therapeutic regimen will improve Outcome: Progressing   Problem: Education: Goal: Ability to state activities that reduce stress will improve Outcome: Progressing

## 2023-12-08 NOTE — Progress Notes (Signed)
   12/08/23 2300  Psych Admission Type (Psych Patients Only)  Admission Status Involuntary  Psychosocial Assessment  Patient Complaints Depression  Eye Contact Fair  Facial Expression Animated  Affect Appropriate to circumstance  Speech Logical/coherent  Interaction Assertive  Motor Activity Slow  Appearance/Hygiene Unremarkable  Behavior Characteristics Cooperative;Appropriate to situation  Mood Pleasant  Thought Process  Coherency WDL  Content Preoccupation  Delusions None reported or observed  Perception WDL  Hallucination None reported or observed  Judgment Impaired  Confusion Mild  Danger to Self  Current suicidal ideation? Denies  Agreement Not to Harm Self Yes  Description of Agreement verbal  Danger to Others  Danger to Others None reported or observed

## 2023-12-09 DIAGNOSIS — F333 Major depressive disorder, recurrent, severe with psychotic symptoms: Secondary | ICD-10-CM | POA: Diagnosis not present

## 2023-12-09 LAB — GLUCOSE, CAPILLARY
Glucose-Capillary: 152 mg/dL — ABNORMAL HIGH (ref 70–99)
Glucose-Capillary: 151 mg/dL — ABNORMAL HIGH (ref 70–99)
Glucose-Capillary: 177 mg/dL — ABNORMAL HIGH (ref 70–99)
Glucose-Capillary: 101 mg/dL — ABNORMAL HIGH (ref 70–99)

## 2023-12-09 NOTE — [facility] Group Notes (Signed)
 LCSW Wellness Group Note   12/09/2023 1:00pm  Type of Group and Topic: Psychoeducational Group:  Wellness  Participation Level:  Active  Description of Group  Wellness group introduces the topic and its focus on developing healthy habits across the spectrum and its relationship to a decrease in hospital admissions.  Six areas of wellness are discussed: physical, social spiritual, intellectual, occupational, and emotional.  Patients are asked to consider their current wellness habits and to identify areas of wellness where they are interested and able to focus on improvements.    Therapeutic Goals Patients will understand components of wellness and how they can positively impact overall health.  Patients will identify areas of wellness where they have developed good habits. Patients will identify areas of wellness where they would like to make improvements.    Summary of Patient Progress: good participation in group discussion, pt made a number of comments.  Pt identified social and occupational wellness as areas that needed improvement.  Pt was not able to identify a positive wellness area in her life today.       Therapeutic Modalities: Cognitive Behavioral Therapy Psychoeducation    Bridget Cordella Simmonds, LCSW
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
## 2023-12-09 NOTE — Group Note (Signed)
 Date:  12/09/2023 Time:  9:17 PM  Group Topic/Focus:  Wrap-Up Group:   The focus of this group is to help patients review their daily goal of treatment and discuss progress on daily workbooks.    Participation Level:  Did Not Attend  Participation Quality:      Affect:      Cognitive:      Insight: None  Engagement in Group:  None  Modes of Intervention:      Additional Comments:    Tommas CHRISTELLA Bunker 12/09/2023, 9:17 PM

## 2023-12-09 NOTE — Plan of Care (Signed)
  Problem: Education: Goal: Utilization of techniques to improve thought processes will improve Outcome: Progressing Goal: Knowledge of the prescribed therapeutic regimen will improve Outcome: Progressing   Problem: Education: Goal: Ability to state activities that reduce stress will improve Outcome: Progressing   Problem: Education: Goal: Knowledge of [facility] information/materials will improve Outcome: Progressing

## 2023-12-09 NOTE — Plan of Care (Signed)
  Problem: Education: Goal: Ability to state activities that reduce stress will improve Outcome: Progressing   Problem: Education: Goal: Mental status will improve Outcome: Progressing

## 2023-12-09 NOTE — Progress Notes (Signed)
 Patient pleasant and cooperative.  Mildly confused.  Endorses depression/sadness.  Denies SI/HI and AVH. Denies pain.    Compliant with scheduled medications.  15 min checks in place for safety.  Patient is present int the milieu.  Appropriate interaction with peers and staff.

## 2023-12-09 NOTE — Progress Notes (Signed)
   12/09/23 2000  Psych Admission Type (Psych Patients Only)  Admission Status Involuntary  Psychosocial Assessment  Patient Complaints Depression  Eye Contact Fair  Facial Expression Animated  Affect Appropriate to circumstance  Speech Logical/coherent  Interaction Assertive  Motor Activity Slow  Appearance/Hygiene Unremarkable  Behavior Characteristics Appropriate to situation;Cooperative  Mood Pleasant  Thought Process  Coherency WDL  Content Preoccupation  Delusions None reported or observed  Perception WDL  Hallucination None reported or observed  Judgment Impaired  Confusion Mild  Danger to Self  Current suicidal ideation? Denies  Agreement Not to Harm Self Yes  Description of Agreement verbal  Danger to Others  Danger to Others None reported or observed

## 2023-12-10 DIAGNOSIS — F333 Major depressive disorder, recurrent, severe with psychotic symptoms: Secondary | ICD-10-CM | POA: Diagnosis not present

## 2023-12-10 LAB — GLUCOSE, CAPILLARY
Glucose-Capillary: 156 mg/dL — ABNORMAL HIGH (ref 70–99)
Glucose-Capillary: 178 mg/dL — ABNORMAL HIGH (ref 70–99)
Glucose-Capillary: 97 mg/dL (ref 70–99)
Glucose-Capillary: 204 mg/dL — ABNORMAL HIGH (ref 70–99)
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
## 2023-12-10 NOTE — Plan of Care (Signed)
  Problem: Education: Goal: Utilization of techniques to improve thought processes will improve Outcome: Progressing Goal: Knowledge of the prescribed therapeutic regimen will improve Outcome: Progressing   Problem: Education: Goal: Ability to state activities that reduce stress will improve Outcome: Progressing   Problem: Education: Goal: Knowledge of [facility] information/materials will improve Outcome: Progressing
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
## 2023-12-10 NOTE — Group Note (Signed)
 Date:  12/10/2023 Time:  12:36 PM  Group Topic/Focus:  Overcoming Stress:   The focus of this group is to define stress and help patients assess their triggers.    Participation Level:  Active  Participation Quality:  Appropriate and Attentive  Affect:  Appropriate  Cognitive:  Alert and Appropriate  Insight: Appropriate and Good  Engagement in Group:  Engaged  Modes of Intervention:  Activity  Additional Comments:     Natasha Chandler Heard 12/10/2023, 12:36 PM

## 2023-12-10 NOTE — Group Note (Signed)
 Recreation Therapy Group Note   Group Topic:Coping Skills  Group Date: 12/10/2023 Start Time: 1500 End Time: 1600 Facilitators: Celestia Jeoffrey BRAVO, LRT, CTRS Location:  Dayroom  Group Description: Mind Map.  Patient was provided a blank template of a diagram with 32 blank boxes in a tiered system, branching from the center (similar to a bubble chart). LRT directed patients to label the middle of the diagram Coping Skills. LRT and patients then came up with 8 different coping skills as examples. Pt were directed to record their coping skills in the 2nd tier boxes closest to the center.  Patients would then share their coping skills with the group as LRT wrote them out. LRT gave a handout of 99 different coping skills at the end of group.   Goal Area(s) Addressed: Patients will be able to define "coping skills". Patient will identify new coping skills.  Patient will increase communication.   Affect/Mood: Appropriate   Participation Level: Active and Engaged   Participation Quality: Independent   Behavior: Calm and Cooperative   Speech/Thought Process: Loose association   Insight: Fair   Judgement: Fair    Modes of Intervention: Clarification, Education, Guided Discussion, and Worksheet   Patient Response to Interventions:  Engaged and Receptive   Education Outcome:  Acknowledges education   Clinical Observations/Individualized Feedback: Mayerly was active in their participation of session activities and group discussion. Pt identified cooking, coloring, take a time out, chew gum and go hiking as coping skills. Pt mentioned all the legal paperwork she was handed and shuffled through them a couple times throughout group. Pt was able to redirect herself and come back to the group handout. Pt said Man, I gotta figure out how I am getting home tonight. A peer joined in the conversation and asked if pt was going home. Pt responded with: well yeah if they will let me and my sister says  its okay. Overall, pt interacted well with LRT and peers duration of session.    Plan: Continue to engage patient in RT group sessions 2-3x/week.   Jeoffrey BRAVO Celestia, LRT, CTRS 12/10/2023 4:45 PM

## 2023-12-10 NOTE — Plan of Care (Addendum)
 Problem: Activity: Goal: Interest or engagement in activities will improve Outcome: Progressing   Problem: Health Behavior/Discharge Planning: Goal: Compliance with treatment plan for underlying cause of condition will improve Outcome: Progressing   Problem: Physical Regulation: Goal: Ability to maintain clinical measurements within normal limits will improve Outcome: Progressing  Pt visible in dayroom majority of this shift. Denies SI, HI, AVh and pain when assessed. However, pt remains confused, observed going into peer's rooms, required multiple verbal redirections this shift. Mood is labile with intermittent tearfulness in demand for discharge My sister don't know what the hell she's doing, she just wants my money. I can take care of my damn self. Emotional support, reassurance and encouragement offered to pt this shift. Pt remains medication compliant, denies adverse drug reactions. Cooperative with CBG monitoring. Safety checks maintained at Q 15 minutes intervals. Pt remains safe in milieu.

## 2023-12-10 NOTE — Progress Notes (Signed)
   12/10/23 2000  Psych Admission Type (Psych Patients Only)  Admission Status Involuntary  Psychosocial Assessment  Patient Complaints Agitation;Anxiety;Irritability  Eye Contact Fair  Facial Expression Animated  Affect Appropriate to circumstance  Speech Logical/coherent  Interaction Assertive  Motor Activity Slow  Appearance/Hygiene Unremarkable  Behavior Characteristics Cooperative  Mood Anxious;Pleasant  Thought Process  Coherency WDL  Content Preoccupation  Delusions None reported or observed  Perception WDL  Hallucination None reported or observed  Judgment Impaired  Confusion Mild  Danger to Self  Current suicidal ideation? Denies  Agreement Not to Harm Self Yes  Description of Agreement verbal  Danger to Others  Danger to Others None reported or observed

## 2023-12-11 DIAGNOSIS — F333 Major depressive disorder, recurrent, severe with psychotic symptoms: Secondary | ICD-10-CM | POA: Diagnosis not present

## 2023-12-11 LAB — GLUCOSE, CAPILLARY
Glucose-Capillary: 140 mg/dL — ABNORMAL HIGH (ref 70–99)
Glucose-Capillary: 179 mg/dL — ABNORMAL HIGH (ref 70–99)
Glucose-Capillary: 98 mg/dL (ref 70–99)
Glucose-Capillary: 146 mg/dL — ABNORMAL HIGH (ref 70–99)

## 2023-12-11 MED ORDER — ACETAMINOPHEN 325 MG PO TABS
650.0000 mg | ORAL_TABLET | Freq: Four times a day (QID) | ORAL | Status: DC | PRN
  Administered 2023-12-11 – 2024-01-03 (×8): 650 mg via ORAL
  Filled 2023-12-11 (×9): qty 2
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
## 2023-12-11 NOTE — Group Note (Signed)
 Recreation Therapy Group Note   Group Topic:Health and Wellness  Group Date: 12/11/2023 Start Time: 1100 End Time: 1135 Facilitators: Celestia Jeoffrey BRAVO, LRT, CTRS Location:  Dayroom  Group Description: Seated Exercise. LRT discussed the mental and physical benefits of exercise. LRT and group discussed how physical activity can be used as a coping skill. Pt's and LRT followed along to an exercise video on the TV screen that provided a visual representation and audio description of every exercise performed. Pt's encouraged to listen to their bodies and stop at any time if they experience feelings of discomfort or pain. Pts were encouraged to drink water and stay hydrated.   Goal Area(s) Addressed: Patient will learn benefits of physical activity. Patient will identify exercise as a coping skill.  Patient will follow multistep directions. Patient will try a new leisure interest.    Affect/Mood: Appropriate   Participation Level: Active and Engaged   Participation Quality: Independent   Behavior: Appropriate, Calm, and Cooperative   Speech/Thought Process: Coherent   Insight: Fair   Judgement: Fair    Modes of Intervention: Activity and Education   Patient Response to Interventions:  Attentive, Engaged, and Receptive   Education Outcome:  Acknowledges education   Clinical Observations/Individualized Feedback: Anarie was active in their participation of session activities and group discussion. Pt completed all exercises as shown. Pt was pleasant and interacted well with LRT and peers duration of session.    Plan: Continue to engage patient in RT group sessions 2-3x/week.   Jeoffrey BRAVO Celestia, LRT, CTRS 12/11/2023 12:48 PM
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
## 2023-12-11 NOTE — Group Note (Signed)
 Date:  12/11/2023 Time:  1:38 AM  Group Topic/Focus:  Goals Group:   The focus of this group is to help patients establish daily goals to achieve during treatment and discuss how the patient can incorporate goal setting into their daily lives to aide in recovery. Wrap-Up Group:   The focus of this group is to help patients review their daily goal of treatment and discuss progress on daily workbooks.    Participation Level:  Active  Participation Quality:  Appropriate  Affect:  Appropriate  Cognitive:  Appropriate  Insight: Good  Engagement in Group:  Engaged  Modes of Intervention:  Discussion  Additional Comments:    Kristen VEAR Gibbon 12/11/2023, 1:38 AM

## 2023-12-11 NOTE — BH IP Treatment Plan (Signed)
 Interdisciplinary Treatment and Diagnostic Plan Update  12/11/2023 Time of Session: 9:30 AM  Natasha Chandler MRN: [id number]  Principal Diagnosis: MDD (major depressive disorder), recurrent, severe, with psychosis (HCC)  Secondary Diagnoses: Principal Problem:   MDD (major depressive disorder), recurrent, severe, with psychosis (HCC)   Current Medications:  Current Facility-Administered Medications  Medication Dose Route Frequency Provider Last Rate Last Admin   acetaminophen  (TYLENOL ) suppository 650 mg  650 mg Rectal Q6H PRN Rainelle Pfeiffer, MD       albuterol  (PROVENTIL ) (2.5 MG/3ML) 0.083% nebulizer solution 3 mL  3 mL Inhalation Q6H PRN Rainelle Pfeiffer, MD       alum & mag hydroxide-simeth (MAALOX/MYLANTA) 200-200-20 MG/5ML suspension 30 mL  30 mL Oral Q4H PRN Rainelle Pfeiffer, MD       amLODipine  (NORVASC ) tablet 10 mg  10 mg Oral Daily Rainelle Pfeiffer, MD   10 mg at 12/10/23 0954   aspirin  EC tablet 81 mg  81 mg Oral Daily Cam Charlie Loving, DO   81 mg at 12/10/23 9045   donepezil  (ARICEPT ) tablet 5 mg  5 mg Oral QHS Rainelle Pfeiffer, MD   5 mg at 12/10/23 2140   fluticasone  (FLONASE ) 50 MCG/ACT nasal spray 2 spray  2 spray Each Nare Daily PRN Rainelle Pfeiffer, MD       hydrochlorothiazide  (HYDRODIURIL ) tablet 12.5 mg  12.5 mg Oral Daily Cam Charlie Loving, DO   12.5 mg at 12/10/23 9044   insulin  aspart (novoLOG ) injection 0-5 Units  0-5 Units Subcutaneous QHS Cam Charlie Loving, DO   2 Units at 12/10/23 2138   insulin  aspart (novoLOG ) injection 0-9 Units  0-9 Units Subcutaneous TID WC Cam Charlie Loving, DO   1 Units at 12/11/23 9173   insulin  aspart (novoLOG ) injection 8 Units  8 Units Subcutaneous TID WC Cam Charlie Loving, DO   8 Units at 12/11/23 0825   levothyroxine  (SYNTHROID ) tablet 50 mcg  50 mcg Oral Q0600 Cam Charlie Loving, DO   50 mcg at 12/11/23 0630   loperamide  (IMODIUM ) capsule 2 mg  2 mg Oral PRN Parmar, Meenakshi, MD       loratadine   (CLARITIN ) tablet 10 mg  10 mg Oral Daily PRN Rainelle Pfeiffer, MD       losartan  (COZAAR ) tablet 100 mg  100 mg Oral Daily Rainelle Pfeiffer, MD   100 mg at 12/10/23 9045   magnesium  hydroxide (MILK OF MAGNESIA) suspension 30 mL  30 mL Oral Daily PRN Rainelle Pfeiffer, MD       mometasone -formoterol  (DULERA ) 200-5 MCG/ACT inhaler 2 puff  2 puff Inhalation BID Rainelle Pfeiffer, MD   2 puff at 12/11/23 0827   OLANZapine  zydis (ZYPREXA ) disintegrating tablet 5 mg  5 mg Oral TID PRN Rainelle Pfeiffer, MD       ondansetron  (ZOFRAN ) tablet 4 mg  4 mg Oral Q6H PRN Rainelle Pfeiffer, MD       Or   ondansetron  (ZOFRAN ) injection 4 mg  4 mg Intravenous Q6H PRN Rainelle Pfeiffer, MD       QUEtiapine  (SEROQUEL ) tablet 50 mg  50 mg Oral QHS Cam Charlie Loving, DO   50 mg at 12/10/23 2139   rosuvastatin  (CRESTOR ) tablet 20 mg  20 mg Oral Daily Rainelle Pfeiffer, MD   20 mg at 12/10/23 0954   senna-docusate (Senokot-S) tablet 1 tablet  1 tablet Oral QHS PRN Rainelle Pfeiffer, MD       sertraline  (ZOLOFT ) tablet 100 mg  100 mg Oral Daily Rainelle Pfeiffer, MD  100 mg at 12/10/23 0954   PTA Medications: Medications Prior to Admission  Medication Sig Dispense Refill Last Dose/Taking   albuterol  (PROVENTIL  HFA;VENTOLIN  HFA) 108 (90 Base) MCG/ACT inhaler Inhale 1-2 puffs into the lungs every 6 (six) hours as needed for wheezing or shortness of breath.       amLODipine  (NORVASC ) 10 MG tablet Take 10 mg by mouth in the morning.      aspirin  81 MG chewable tablet Chew 1 tablet (81 mg total) by mouth daily.      diclofenac  (VOLTAREN ) 50 MG EC tablet Take 50 mg by mouth 2 (two) times daily.      donepezil  (ARICEPT ) 5 MG tablet Take 5 mg by mouth at bedtime.      fluticasone  (FLONASE ) 50 MCG/ACT nasal spray Place 2 sprays into both nostrils daily as needed for allergies.      hydrochlorothiazide  (HYDRODIURIL ) 12.5 MG tablet Take 12.5 mg by mouth daily.      Insulin  Aspart FlexPen (NOVOLOG ) 100 UNIT/ML Inject 3 Units into the skin  3 (three) times daily with meals.      insulin  degludec (TRESIBA ) 100 UNIT/ML FlexTouch Pen Inject 10 Units into the skin daily.      Insulin  Pen Needle 31G X 5 MM MISC Use to inject insulin  once daily 30 each 5    levothyroxine  (SYNTHROID ) 50 MCG tablet Take 50 mcg by mouth daily before breakfast.      lip balm (CARMEX) ointment Apply topically as needed.      loratadine  (CLARITIN ) 10 MG tablet Take 1 tablet (10 mg total) by mouth daily as needed for allergies.      losartan  (COZAAR ) 100 MG tablet Take 1 tablet (100 mg total) by mouth daily. (Patient taking differently: Take 100 mg by mouth at bedtime.)      pioglitazone  (ACTOS ) 45 MG tablet Take 45 mg by mouth daily.      risperiDONE  (RISPERDAL  M-TABS) 0.5 MG disintegrating tablet Take 1 tablet (0.5 mg total) by mouth at bedtime.      rosuvastatin  (CRESTOR ) 20 MG tablet Take 20 mg by mouth at bedtime.      Semaglutide , 2 MG/DOSE, (OZEMPIC , 2 MG/DOSE,) 8 MG/3ML SOPN Inject 0.75 mLs (2 mg total) into the skin every 7 days. 9 mL 1    sertraline  (ZOLOFT ) 100 MG tablet Take 1 tablet (100 mg total) by mouth daily.      Vitamin D , Ergocalciferol , (DRISDOL) 1.25 MG (50000 UNIT) CAPS capsule Take 50,000 Units by mouth every Sunday. Thursdays       Patient Stressors: Health problems   Marital or family conflict    Patient Strengths: Ability for insight  Capable of independent living  Motivation for treatment/growth  Supportive family/friends   Treatment Modalities: Medication Management, Group therapy, Case management,  1 to 1 session with clinician, Psychoeducation, Recreational therapy.   Physician Treatment Plan for Primary Diagnosis: MDD (major depressive disorder), recurrent, severe, with psychosis (HCC) Long Term Goal(s): Improvement in symptoms so as ready for discharge   Short Term Goals: Ability to identify changes in lifestyle to reduce recurrence of condition will improve Ability to verbalize feelings will improve Ability to  disclose and discuss suicidal ideas Ability to demonstrate self-control will improve Ability to identify and develop effective coping behaviors will improve Ability to maintain clinical measurements within normal limits will improve Compliance with prescribed medications will improve Ability to identify triggers associated with substance abuse/mental health issues will improve  Medication Management: Evaluate patient's response, side effects,  and tolerance of medication regimen.  Therapeutic Interventions: 1 to 1 sessions, Unit Group sessions and Medication administration.  Evaluation of Outcomes: Progressing  Physician Treatment Plan for Secondary Diagnosis: Principal Problem:   MDD (major depressive disorder), recurrent, severe, with psychosis (HCC)  Long Term Goal(s): Improvement in symptoms so as ready for discharge   Short Term Goals: Ability to identify changes in lifestyle to reduce recurrence of condition will improve Ability to verbalize feelings will improve Ability to disclose and discuss suicidal ideas Ability to demonstrate self-control will improve Ability to identify and develop effective coping behaviors will improve Ability to maintain clinical measurements within normal limits will improve Compliance with prescribed medications will improve Ability to identify triggers associated with substance abuse/mental health issues will improve     Medication Management: Evaluate patient's response, side effects, and tolerance of medication regimen.  Therapeutic Interventions: 1 to 1 sessions, Unit Group sessions and Medication administration.  Evaluation of Outcomes: Progressing   RN Treatment Plan for Primary Diagnosis: MDD (major depressive disorder), recurrent, severe, with psychosis (HCC) Long Term Goal(s): Knowledge of disease and therapeutic regimen to maintain health will improve  Short Term Goals: Ability to remain free from injury will improve, Ability to  verbalize frustration and anger appropriately will improve, Ability to demonstrate self-control, and Ability to participate in decision making will improve  Medication Management: RN will administer medications as ordered by provider, will assess and evaluate patient's response and provide education to patient for prescribed medication. RN will report any adverse and/or side effects to prescribing provider.  Therapeutic Interventions: 1 on 1 counseling sessions, Psychoeducation, Medication administration, Evaluate responses to treatment, Monitor vital signs and CBGs as ordered, Perform/monitor CIWA, COWS, AIMS and Fall Risk screenings as ordered, Perform wound care treatments as ordered.  Evaluation of Outcomes: Progressing   LCSW Treatment Plan for Primary Diagnosis: MDD (major depressive disorder), recurrent, severe, with psychosis (HCC) Long Term Goal(s): Safe transition to appropriate next level of care at discharge, Engage patient in therapeutic group addressing interpersonal concerns.  Short Term Goals: Engage patient in aftercare planning with referrals and resources, Increase social support, Increase ability to appropriately verbalize feelings, Increase emotional regulation, Facilitate acceptance of mental health diagnosis and concerns, Facilitate patient progression through stages of change regarding substance use diagnoses and concerns, Identify triggers associated with mental health/substance abuse issues, and Increase skills for wellness and recovery  Therapeutic Interventions: Assess for all discharge needs, 1 to 1 time with Social worker, Explore available resources and support systems, Assess for adequacy in community support network, Educate family and significant other(s) on suicide prevention, Complete Psychosocial Assessment, Interpersonal group therapy.  Evaluation of Outcomes: Progressing   Progress in Treatment: Attending groups: Yes. Participating in groups: Yes. Taking  medication as prescribed: Yes. Toleration medication: Yes. Family/Significant other contact made: Yes, individual(s) contacted:  Jenkins forbes Hutchinson, aunt, Sharea Guinther  Patient understands diagnosis: Yes. Discussing patient identified problems/goals with staff: Yes. Medical problems stabilized or resolved: Yes. Denies suicidal/homicidal ideation: Yes. Issues/concerns per patient self-inventory: No. Other: None   New problem(s) identified: No, Describe:  None identified 12/01/23 Update: Pt is mildly confused but pleasant. Update 12/06/23: No changes at this time    New Short Term/Long Term Goal(s):elimination of symptoms of psychosis, medication management for mood stabilization; elimination of SI thoughts; development of comprehensive mental wellness plan. 12/01/23 Update: Goal to remain the same. Update 12/06/23: No changes at this time Update 12/11/23: No changes at this time    Patient Goals:  Besides getting  back to my family, getting back to my life, I have a two bedroom house 12/01/23 Update: Pt goal to remain the same. Update 12/06/23: No changes at this time Update 12/11/23: No changes at this time       Discharge Plan or Barriers: CSW will assist with appropriate discharge planning  12/01/23 Update: Discharge plan to remain the same. Update 12/06/23: Pt's sister is getting guardianship of pt and has a court date on 12/28/23. Update 12/11/23: Pt was served guardianship paperwork. Pt has guardianship hearing on 12/28/23   Reason for Continuation of Hospitalization: Depression Medication stabilization 12/01/23 Update: Depression Medication stabilization. Update 12/06/23: No changes at this time Update 12/11/23: No changes at this time     Estimated Length of Stay:1 to 7 days 12/01/23 Update: TBD Update 12/06/23: TBD Update 12/11/23: TBD  Last 3 [place] Suicide Severity Risk Score: Flowsheet Row Admission (Current) from 11/25/2023 in [facility] to Hosp-Admission (Discharged) from  11/16/2023 in [facility] from 08/09/2023 in [facility]  C-SSRS RISK CATEGORY No Risk No Risk No Risk       Last PHQ 2/9 Scores:    08/13/2020    4:59 PM 08/04/2020    3:16 PM 12/15/2015    7:07 PM  Depression screen PHQ 2/9  Decreased Interest 1 1 0  Down, Depressed, Hopeless 2 2 0  PHQ - 2 Score 3 3 0  Altered sleeping 3 3   Tired, decreased energy 2 2   Change in appetite 2 2   Feeling bad or failure about yourself  1 1   Trouble concentrating 2 2   Moving slowly or fidgety/restless 2 2   Suicidal thoughts 0 0   PHQ-9 Score 15 15   Difficult doing work/chores Somewhat difficult Somewhat difficult     Scribe for Treatment Team: Lum JONETTA Croft, LCSWA 12/11/2023 10:08 AM

## 2023-12-11 NOTE — Plan of Care (Signed)
  Problem: Education: Goal: Utilization of techniques to improve thought processes will improve Outcome: Progressing Goal: Knowledge of the prescribed therapeutic regimen will improve Outcome: Progressing   Problem: Education: Goal: Ability to state activities that reduce stress will improve Outcome: Progressing   Problem: Education: Goal: Knowledge of [facility] information/materials will improve Outcome: Progressing

## 2023-12-11 NOTE — Group Note (Signed)
 Date:  12/11/2023 Time:  10:36 AM  Group Topic/Focus:  Managing Feelings:   The focus of this group is to identify what feelings patients have difficulty handling and develop a plan to handle them in a healthier way upon discharge.    Participation Level:  Active  Participation Quality:  Appropriate  Affect:  Appropriate  Cognitive:  Appropriate  Insight: Appropriate  Engagement in Group:  Engaged  Modes of Intervention:  Activity  Additional Comments:    Camellia HERO Maigan Bittinger 12/11/2023, 10:36 AM

## 2023-12-11 NOTE — Plan of Care (Signed)
 D: Pt alert and oriented. Pt denies experiencing any anxiety/depression at this time. Pt reports experiencing 7/10 Right knee pain at this time, prn medication given. Pt denies experiencing any SI/HI, or AVH at this time.   A: Scheduled medications administered to pt, per MD orders. Support and encouragement provided. Frequent verbal contact made. Routine safety checks conducted q15 minutes.   R: No adverse drug reactions noted. Pt verbally contracts for safety at this time. Pt compliant with medications and treatment plan. Pt interacts well with others on the unit. Pt remains safe at this time. Plan of care ongoing.  Problem: Education: Goal: Emotional status will improve Outcome: Not Progressing Goal: Mental status will improve Outcome: Not Progressing

## 2023-12-11 NOTE — Group Note (Signed)
 LCSW Group Therapy Note  Group Date: 12/11/2023 Start Time: 1315 End Time: 1345   Type of Therapy and Topic:  Group Therapy - Healthy vs Unhealthy Coping Skills  Participation Level:  Did Not Attend   Description of Group The focus of this group was to determine what unhealthy coping techniques typically are used by group members and what healthy coping techniques would be helpful in coping with various problems. Patients were guided in becoming aware of the differences between healthy and unhealthy coping techniques. Patients were asked to identify 2-3 healthy coping skills they would like to learn to use more effectively.  Therapeutic Goals Patients learned that coping is what human beings do all day long to deal with various situations in their lives Patients defined and discussed healthy vs unhealthy coping techniques Patients identified their preferred coping techniques and identified whether these were healthy or unhealthy Patients determined 2-3 healthy coping skills they would like to become more familiar with and use more often. Patients provided support and ideas to each other   Summary of Patient Progress:  X   Therapeutic Modalities Cognitive Behavioral Therapy Motivational Interviewing  Natasha Chandler, [place] 12/11/2023  2:51 PM

## 2023-12-12 DIAGNOSIS — F333 Major depressive disorder, recurrent, severe with psychotic symptoms: Secondary | ICD-10-CM | POA: Diagnosis not present

## 2023-12-12 LAB — GLUCOSE, CAPILLARY
Glucose-Capillary: 165 mg/dL — ABNORMAL HIGH (ref 70–99)
Glucose-Capillary: 255 mg/dL — ABNORMAL HIGH (ref 70–99)
Glucose-Capillary: 56 mg/dL — ABNORMAL LOW (ref 70–99)
Glucose-Capillary: 210 mg/dL — ABNORMAL HIGH (ref 70–99)

## 2023-12-12 NOTE — Progress Notes (Signed)
   12/12/23 0654  15 Minute Checks  Location Bedroom  Visual Appearance Calm  Behavior Composed  Sleep (Behavioral Health Patients Only)  Calculate sleep? (Click Yes once per 24 hr at 0600 safety check) Yes  Documented sleep last 24 hours 6.5

## 2023-12-12 NOTE — Group Note (Signed)
 Date:  12/12/2023 Time:  7:13 AM  Group Topic/Focus:  Wrap-Up Group:   The focus of this group is to help patients review their daily goal of treatment and discuss progress on daily workbooks.    Participation Level:  Active  Participation Quality:  Appropriate  Affect:  Appropriate  Cognitive:  Alert  Insight: Improving  Engagement in Group:  Improving  Modes of Intervention:  Discussion  Additional Comments:    Natasha Chandler CHRISTELLA Bunker 12/12/2023, 7:13 AM

## 2023-12-12 NOTE — Group Note (Signed)
 Date:  12/12/2023 Time:  9:20 PM  Group Topic/Focus:  Wellness Toolbox:   The focus of this group is to discuss various aspects of wellness, balancing those aspects and exploring ways to increase the ability to experience wellness.  Patients will create a wellness toolbox for use upon discharge.    Participation Level:  Active  Participation Quality:  Appropriate, Attentive, Sharing, and Supportive  Affect:  Appropriate  Cognitive:  Appropriate  Insight: Appropriate and Good  Engagement in Group:  Engaged and Supportive  Modes of Intervention:  Discussion and Support  Additional Comments:     Kerri Katz 12/12/2023, 9:20 PM

## 2023-12-12 NOTE — Progress Notes (Signed)
   12/12/23 1000  Psych Admission Type (Psych Patients Only)  Admission Status Involuntary  Psychosocial Assessment  Patient Complaints Anxiety  Eye Contact Fair  Facial Expression Animated  Affect Appropriate to circumstance  Speech Logical/coherent  Interaction Assertive  Motor Activity Slow  Appearance/Hygiene Unremarkable  Behavior Characteristics Cooperative  Mood Pleasant  Thought Process  Coherency WDL  Content Preoccupation  Delusions None reported or observed  Perception WDL  Hallucination None reported or observed  Judgment Impaired  Confusion None  Danger to Self  Current suicidal ideation? Denies  Danger to Others  Danger to Others None reported or observed   Dar Note: Patient presents with a calm affect and pleasant mood.  Denies suicidal thoughts, auditory and visual hallucinations.  Medications given as prescribed.  Patient visible in milieu interacting well with staff and peers.  Patient is safe on the unit.
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
## 2023-12-12 NOTE — Group Note (Signed)
 Date:  12/12/2023 Time:  10:31 AM  Group Topic/Focus:  Goals Group:   The focus of this group is to help patients establish daily goals to achieve during treatment and discuss how the patient can incorporate goal setting into their daily lives to aide in recovery.    Participation Level:  Active  Participation Quality:  Appropriate  Affect:  Appropriate  Cognitive:  Appropriate  Insight: Appropriate  Engagement in Group:  Engaged  Modes of Intervention:  Discussion and Education  Additional Comments:  none  Norleen SHAUNNA Bias 12/12/2023, 10:31 AM

## 2023-12-12 NOTE — Plan of Care (Signed)
 Patient alert and oriented. Pleasantly confused. Denies SI, HI, AVH, and pain. Scheduled medications administered to patient, per MD orders. Support and encouragement provided.  Routine safety checks conducted every 15 minutes. Patient compliant with medications and treatmel. Patient remains safe at this time.  Problem: Education: Goal: Utilization of techniques to improve thought processes will improve Outcome: Progressing Goal: Knowledge of the prescribed therapeutic regimen will improve Outcome: Progressing   Problem: Education: Goal: Ability to state activities that reduce stress will improve Outcome: Progressing

## 2023-12-13 DIAGNOSIS — F333 Major depressive disorder, recurrent, severe with psychotic symptoms: Secondary | ICD-10-CM | POA: Diagnosis not present

## 2023-12-13 LAB — GLUCOSE, CAPILLARY
Glucose-Capillary: 167 mg/dL — ABNORMAL HIGH (ref 70–99)
Glucose-Capillary: 238 mg/dL — ABNORMAL HIGH (ref 70–99)
Glucose-Capillary: 67 mg/dL — ABNORMAL LOW (ref 70–99)
Glucose-Capillary: 221 mg/dL — ABNORMAL HIGH (ref 70–99)

## 2023-12-13 NOTE — Plan of Care (Signed)
  Problem: Education: Goal: Ability to state activities that reduce stress will improve Outcome: Progressing   Problem: Education: Goal: Emotional status will improve Outcome: Progressing Goal: Mental status will improve Outcome: Progressing

## 2023-12-13 NOTE — Progress Notes (Signed)
   12/13/23 0644  15 Minute Checks  Location Bedroom  Visual Appearance Calm  Behavior Sleeping  Sleep (Behavioral Health Patients Only)  Calculate sleep? (Click Yes once per 24 hr at 0600 safety check) Yes  Documented sleep last 24 hours 8.5

## 2023-12-13 NOTE — Progress Notes (Signed)
   12/12/23 2200  Psych Admission Type (Psych Patients Only)  Admission Status Involuntary  Psychosocial Assessment  Patient Complaints Anxiety  Eye Contact Fair  Facial Expression Animated  Affect Appropriate to circumstance  Speech Logical/coherent  Interaction Assertive  Motor Activity Slow  Appearance/Hygiene Unremarkable  Behavior Characteristics Cooperative  Mood Pleasant  Thought Process  Coherency WDL  Content Preoccupation  Delusions None reported or observed  Perception WDL  Hallucination None reported or observed  Judgment Impaired  Confusion None  Danger to Self  Current suicidal ideation? Denies   Patient alert and oriented. Pleasant and cooperative. Visited by family members. States visit went well.  Scheduled medications administered to patient, per MD orders. Routine safety checks conducted every 15 minutes. Patient interacts well with others on the unit.  Attends group. Patient remains safe at this time.

## 2023-12-13 NOTE — Inpatient Diabetes Management (Signed)
 Inpatient Diabetes Program Recommendations  AACE/ADA: New Consensus Statement on Inpatient Glycemic Control  Target Ranges:  Prepandial:   less than 140 mg/dL      Peak postprandial:   less than 180 mg/dL (1-2 hours)      Critically ill patients:  140 - 180 mg/dL    Latest Reference Range & Units 12/12/23 07:15 12/12/23 10:10 12/12/23 11:16 12/12/23 12:09 12/12/23 15:59 12/12/23 19:46  Glucose-Capillary 70 - 99 mg/dL 834 (H)   Novolog  10 units 255 (H)   Novolog  13 units 56 (L) 210 (H)  Novolog  2 units    Review of Glycemic Control  Diabetes history: DM2 Outpatient Diabetes medications: Tresiba  10 units daily, Novolog  3 units TId with meals Current orders for Inpatient glycemic control: Novolog  8 units TID with meals, Novolog  0-9 units TID with meals, Novolog  0-5 units QHS  Inpatient Diabetes Program Recommendations:    Insulin : Due to late timing of Novolog  administration on 12/12/23 (given Novolog  10 units at 10:10 (for 8am dose) and Novolog  13 units at 12:09), insulin  stacked and caused patient to experience hypoglycemia.   NURSING: Please try to administer insulin  as ordered as Novolog  works for about 4 hours and when given late and to closely together it can stack and lead to hypoglycemia.  Thanks, Earnie Gainer, RN, MSN, CDCES Diabetes Coordinator Inpatient Diabetes Program [phone] (Team Pager from 8am to 5pm)

## 2023-12-13 NOTE — Group Note (Signed)
 Recreation Therapy Group Note   Group Topic:Relaxation  Group Date: 12/13/2023 Start Time: 1100 End Time: 1140 Facilitators: Celestia Jeoffrey BRAVO, LRT, CTRS Location:  Dayroom  Group Description: Chair Yoga. LRT and patients discussed the benefits of yoga and how it differs from strength exercises. LRT educated patients on the mental and physical benefits of yoga and deep breathing and how it can be used as a associate professor. LRT and patients followed along to a guided yoga session on the television that focused on all parts of the body, as well as deep breathing. Pt encouraged to stop movement at any time if they feel discomfort or pain.   Goal Area(s) Addressed: Patient will practice using relaxation technique. Patient will identify a new coping skill.  Patient will follow multistep directions to reduce anxiety and stress.   Affect/Mood: Appropriate   Participation Level: Active and Engaged   Participation Quality: Independent   Behavior: Appropriate, Calm, and Cooperative   Speech/Thought Process: Coherent   Insight: Good   Judgement: Good   Modes of Intervention: Activity   Patient Response to Interventions:  Attentive, Engaged, and Receptive   Education Outcome:  Acknowledges education   Clinical Observations/Individualized Feedback: Natasha Chandler was active in their participation of session activities and group discussion. Pt completed all exercises as shown while interacting well with LRT and peers duration of session.    Plan: Continue to engage patient in RT group sessions 2-3x/week.   Jeoffrey BRAVO Celestia, LRT, CTRS 12/13/2023 1:28 PM
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
## 2023-12-13 NOTE — Progress Notes (Signed)
 Patient is pleasant and cooperative. Animated affect.  Endorses anxiety.  Denies SI/HI and AVH.  Denis depression.  Denies pain.  Reports she slept well.    Compliant with scheduled medications.  BP medications held.  Dr. Victoria made aware. 15 min checks in place for safety.  Patient is present in the milieu.  Appropriate interaction with peers and staff.

## 2023-12-13 NOTE — Plan of Care (Signed)
  Problem: Education: Goal: Ability to state activities that reduce stress will improve 12/13/2023 0126 by Ezzard Stank, RN Outcome: Progressing 12/13/2023 0125 by Ezzard Stank, RN Outcome: Progressing   Problem: Education: Goal: Knowledge of [facility] Education information/materials will improve 12/13/2023 0126 by Ezzard Stank, RN Outcome: Progressing 12/13/2023 0125 by Ezzard Stank, RN Outcome: Progressing Goal: Emotional status will improve 12/13/2023 0126 by Ezzard Stank, RN Outcome: Progressing 12/13/2023 0125 by Ezzard Stank, RN Outcome: Progressing Goal: Mental status will improve 12/13/2023 0126 by Ezzard Stank, RN Outcome: Progressing 12/13/2023 0125 by Ezzard Stank, RN Outcome: Progressing Goal: Verbalization of understanding the information provided will improve 12/13/2023 0126 by Ezzard Stank, RN Outcome: Progressing 12/13/2023 0125 by Ezzard Stank, RN Outcome: Progressing   Problem: Education: Goal: Knowledge of [place] [facility] Education information/materials will improve 12/13/2023 0126 by Ezzard Stank, RN Outcome: Progressing 12/13/2023 0125 by Ezzard Stank, RN Outcome: Progressing   Problem: Education: Goal: Knowledge of [place] [facility] Education information/materials will improve Outcome: Progressing Goal: Emotional status will improve Outcome: Progressing   Problem: Activity: Goal: Interest or engagement in activities will improve Outcome: Progressing Goal: Sleeping patterns will improve Outcome: Progressing
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
## 2023-12-14 LAB — GLUCOSE, CAPILLARY
Glucose-Capillary: 158 mg/dL — ABNORMAL HIGH (ref 70–99)
Glucose-Capillary: 216 mg/dL — ABNORMAL HIGH (ref 70–99)
Glucose-Capillary: 124 mg/dL — ABNORMAL HIGH (ref 70–99)
Glucose-Capillary: 54 mg/dL — ABNORMAL LOW (ref 70–99)
Glucose-Capillary: 156 mg/dL — ABNORMAL HIGH (ref 70–99)

## 2023-12-14 NOTE — Plan of Care (Signed)
  Problem: Education: Goal: Utilization of techniques to improve thought processes will improve Outcome: Progressing Goal: Knowledge of the prescribed therapeutic regimen will improve Outcome: Progressing   Problem: Education: Goal: Ability to state activities that reduce stress will improve Outcome: Progressing

## 2023-12-14 NOTE — Inpatient Diabetes Management (Signed)
 Inpatient Diabetes Program Recommendations  AACE/ADA: New Consensus Statement on Inpatient Glycemic Control   Target Ranges:  Prepandial:   less than 140 mg/dL      Peak postprandial:   less than 180 mg/dL (1-2 hours)      Critically ill patients:  140 - 180 mg/dL    Latest Reference Range & Units 12/13/23 07:31 12/13/23 11:33 12/13/23 16:21 12/13/23 19:34 12/14/23 07:44  Glucose-Capillary 70 - 99 mg/dL 832 (H) 761 (H) 67 (L) 221 (H) 158 (H)   Review of Glycemic Control  Diabetes history: DM2 Outpatient Diabetes medications: Tresiba  10 units daily, Novolog  3 units TId with meals Current orders for Inpatient glycemic control: Novolog  8 units TID with meals, Novolog  0-9 units TID with meals, Novolog  0-5 units QHS  Inpatient Diabetes Program Recommendations:    Insulin : May want to consider decreasing meal coverage to Novolog  7 units TID if patient eats at least 50% of meal.  Thanks, Earnie Gainer, RN, MSN, CDCES Diabetes Coordinator Inpatient Diabetes Program [phone] (Team Pager from 8am to 5pm)
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
## 2023-12-14 NOTE — Group Note (Signed)
 Recreation Therapy Group Note   Group Topic:Relaxation  Group Date: 12/14/2023 Start Time: 1400 End Time: 1435 Facilitators: Celestia Jeoffrey BRAVO, LRT, CTRS Location:  Dayroom  Group Description: Meditation. LRT and patients discussed what they know about meditation and mindfulness. LRT played a Deep Breathing Meditation exercise script for patients to follow along to. LRT and patients discussed how meditation and deep breathing can be used as a coping skill post--discharge to help manage symptoms of stress.   Goal Area(s) Addressed: Patient will practice using relaxation technique. Patient will identify a new coping skill.  Patient will follow multistep directions to reduce anxiety and stress.   Affect/Mood: Appropriate   Participation Level: Active and Engaged   Participation Quality: Independent   Behavior: Appropriate, Calm, and Cooperative   Speech/Thought Process: Coherent   Insight: Good   Judgement: Good   Modes of Intervention: Activity   Patient Response to Interventions:  Attentive, Engaged, Interested , and Receptive   Education Outcome:  Acknowledges education   Clinical Observations/Individualized Feedback: Chenise was active in their participation of session activities and group discussion. Pt identified I needed that after finding out that I can't call my sister. Pt interacted well with LRT and peers duration of session.    Plan: Continue to engage patient in RT group sessions 2-3x/week.   Jeoffrey BRAVO Celestia, LRT, CTRS 12/14/2023 2:39 PM

## 2023-12-14 NOTE — Group Note (Signed)
 Recreation Therapy Group Note   Group Topic:Emotion Expression  Group Date: 12/14/2023 Start Time: 1100 End Time: 1140 Facilitators: Celestia Jeoffrey BRAVO, LRT, CTRS Location:  Dayroom  Group Description: Picture a Diplomatic Services Operational Officer. Patients and LRT discuss what it means to be "at peace", what it feels like physically and mentally. Pts are given a canvas and watercolor paint to use and encouraged to draw their idea of a peaceful place. Pts and LRT discuss how they use this in their daily life post discharge. Pts are encouraged to take their canvas home with them as a reminder of their peaceful place whenever they are feeling depressed, anxious, etc.    Goal Area(s) Addressed:  Patient will identify what it means to experience a "peaceful" emotion. Patient will identify a new coping skill.  Patient will express their emotions through art. Patients will increase communication by talking with LRT and peers while in group.   Affect/Mood: N/A   Participation Level: Did not attend    Clinical Observations/Individualized Feedback: Lane walked into the dayroom late and shared that she was feeling tired and I want to go home. Pt did not participate in group.   Plan: Continue to engage patient in RT group sessions 2-3x/week.   Jeoffrey BRAVO Celestia, LRT, CTRS 12/14/2023 12:24 PM

## 2023-12-14 NOTE — Progress Notes (Signed)
   12/13/23 2100  Psych Admission Type (Psych Patients Only)  Admission Status Involuntary  Psychosocial Assessment  Patient Complaints Anxiety  Eye Contact Fair  Facial Expression Animated  Affect Appropriate to circumstance  Speech Logical/coherent  Interaction Assertive  Motor Activity Slow  Appearance/Hygiene Unremarkable  Behavior Characteristics Appropriate to situation  Mood Pleasant  Thought Process  Coherency WDL  Content WDL  Delusions None reported or observed  Perception WDL  Hallucination None reported or observed  Judgment Impaired  Confusion None  Danger to Self  Current suicidal ideation? Denies   Patient alert and oriented. Bright affect and pleasant mood. Visited with family members. Scheduled medications administered to patient, per MD orders. Routine safety checks conducted every 15 minutes. Patient interacts well with others on the unit. Denies SI/HI/AVH and pain. Patient remains safe at this time.

## 2023-12-14 NOTE — Progress Notes (Signed)
   12/14/23 0645  15 Minute Checks  Location Bedroom  Visual Appearance Calm  Behavior Sleeping  Sleep (Behavioral Health Patients Only)  Calculate sleep? (Click Yes once per 24 hr at 0600 safety check) Yes  Documented sleep last 24 hours 7.25

## 2023-12-14 NOTE — Group Note (Signed)
 Date:  12/14/2023 Time:  6:20 AM  Group Topic/Focus:  Wrap-Up Group:   The focus of this group is to help patients review their daily goal of treatment and discuss progress on daily workbooks.    Participation Level:  Active  Participation Quality:  Appropriate and Attentive  Affect:  Appropriate  Cognitive:  Alert and Appropriate  Insight: Appropriate, Good, and Improving  Engagement in Group:  Developing/Improving and Engaged  Modes of Intervention:  Discussion, Rapport Building, and Support  Additional Comments:     Natasha Chandler 12/14/2023, 6:20 AM

## 2023-12-14 NOTE — Progress Notes (Signed)
 Patient pleasant and cooperative.  Endorses sadness and anxiety.  Tearful at times throughout the shift when speaking about discharge barriers.  Denies SI/HI and AVH.  Denies pain.     Compliant with scheduled medications. BP medications held.  Dr. Victoria aware. 15 min checks in place for safety.  Patient is present in the milieu.  Appropriate interaction with peers and staff.

## 2023-12-15 LAB — GLUCOSE, CAPILLARY
Glucose-Capillary: 149 mg/dL — ABNORMAL HIGH (ref 70–99)
Glucose-Capillary: 168 mg/dL — ABNORMAL HIGH (ref 70–99)
Glucose-Capillary: 95 mg/dL (ref 70–99)

## 2023-12-15 NOTE — Progress Notes (Signed)
   12/15/23 1100  Psych Admission Type (Psych Patients Only)  Admission Status Involuntary  Psychosocial Assessment  Patient Complaints Anxiety  Eye Contact Fair  Facial Expression Animated  Affect Appropriate to circumstance  Speech Logical/coherent  Interaction Assertive  Motor Activity Slow  Appearance/Hygiene Unremarkable  Behavior Characteristics Appropriate to situation  Mood Pleasant  Thought Process  Coherency WDL  Content WDL  Delusions None reported or observed  Perception WDL  Hallucination None reported or observed  Judgment Impaired  Confusion None  Danger to Self  Current suicidal ideation? Denies  Agreement Not to Harm Self Yes  Description of Agreement verbal  Danger to Others  Danger to Others None reported or observed

## 2023-12-15 NOTE — Progress Notes (Signed)
 Patient was cooperative with treatment on the shift, she was visible in the milieu during the evening, she was compliant with medications, she denies SI, HI & AVH.  Patient seemed to sleep well through out the night.
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
## 2023-12-15 NOTE — Plan of Care (Signed)
   Problem: Education: Goal: Utilization of techniques to improve thought processes will improve Outcome: Progressing Goal: Knowledge of the prescribed therapeutic regimen will improve Outcome: Progressing

## 2023-12-15 NOTE — Group Note (Signed)
 Date:  12/15/2023 Time:  1:07 AM  Group Topic/Focus:  Goals Group:   The focus of this group is to help patients establish daily goals to achieve during treatment and discuss how the patient can incorporate goal setting into their daily lives to aide in recovery.    Participation Level:  Active  Participation Quality:  Appropriate and Attentive  Affect:  Appropriate  Cognitive:  Alert and Appropriate  Insight: Appropriate and Good  Engagement in Group:  Developing/Improving and Engaged  Modes of Intervention:  Discussion and Support  Additional Comments:     Mirl Hillery 12/15/2023, 1:07 AM

## 2023-12-15 NOTE — Plan of Care (Signed)
  Problem: Education: Goal: Utilization of techniques to improve thought processes will improve Outcome: Progressing Goal: Knowledge of the prescribed therapeutic regimen will improve Outcome: Progressing   Problem: Education: Goal: Ability to state activities that reduce stress will improve Outcome: Progressing   Problem: Education: Goal: Knowledge of [place] [facility] Education information/materials will improve Outcome: Progressing Goal: Emotional status will improve Outcome: Progressing Goal: Mental status will improve Outcome: Progressing Goal: Verbalization of understanding the information provided will improve Outcome: Progressing   Problem: Education: Goal: Knowledge of [place] [facility] Education information/materials will improve Outcome: Progressing   Problem: Education: Goal: Knowledge of [place] [facility] Education information/materials will improve Outcome: Progressing Goal: Emotional status will improve Outcome: Progressing Goal: Mental status will improve Outcome: Progressing Goal: Verbalization of understanding the information provided will improve Outcome: Progressing   Problem: Activity: Goal: Interest or engagement in activities will improve Outcome: Progressing Goal: Sleeping patterns will improve Outcome: Progressing   Problem: Coping: Goal: Ability to verbalize frustrations and anger appropriately will improve Outcome: Progressing Goal: Ability to demonstrate self-control will improve Outcome: Progressing   Problem: Health Behavior/Discharge Planning: Goal: Identification of resources available to assist in meeting health care needs will improve Outcome: Progressing Goal: Compliance with treatment plan for underlying cause of condition will improve Outcome: Progressing   Problem: Physical Regulation: Goal: Ability to maintain clinical measurements within normal limits will improve Outcome: Progressing   Problem: Safety: Goal:  Periods of time without injury will increase Outcome: Progressing   Problem: Education: Goal: Ability to describe self-care measures that may prevent or decrease complications (Diabetes Survival Skills Education) will improve Outcome: Progressing Goal: Individualized Educational Video(s) Outcome: Progressing   Problem: Coping: Goal: Ability to adjust to condition or change in health will improve Outcome: Progressing   Problem: Fluid Volume: Goal: Ability to maintain a balanced intake and output will improve Outcome: Progressing   Problem: Health Behavior/Discharge Planning: Goal: Ability to identify and utilize available resources and services will improve Outcome: Progressing Goal: Ability to manage health-related needs will improve Outcome: Progressing   Problem: Metabolic: Goal: Ability to maintain appropriate glucose levels will improve Outcome: Progressing   Problem: Nutritional: Goal: Maintenance of adequate nutrition will improve Outcome: Progressing Goal: Progress toward achieving an optimal weight will improve Outcome: Progressing   Problem: Skin Integrity: Goal: Risk for impaired skin integrity will decrease Outcome: Progressing   Problem: Tissue Perfusion: Goal: Adequacy of tissue perfusion will improve Outcome: Progressing

## 2023-12-16 LAB — GLUCOSE, CAPILLARY
Glucose-Capillary: 167 mg/dL — ABNORMAL HIGH (ref 70–99)
Glucose-Capillary: 135 mg/dL — ABNORMAL HIGH (ref 70–99)
Glucose-Capillary: 122 mg/dL — ABNORMAL HIGH (ref 70–99)
Glucose-Capillary: 105 mg/dL — ABNORMAL HIGH (ref 70–99)

## 2023-12-16 NOTE — Plan of Care (Signed)
  Problem: Education: Goal: Utilization of techniques to improve thought processes will improve Outcome: Progressing Goal: Knowledge of the prescribed therapeutic regimen will improve Outcome: Progressing   Problem: Education: Goal: Ability to state activities that reduce stress will improve Outcome: Progressing
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
## 2023-12-16 NOTE — Plan of Care (Signed)
   Problem: Activity: Goal: Interest or engagement in activities will improve Outcome: Progressing Goal: Sleeping patterns will improve Outcome: Progressing

## 2023-12-16 NOTE — Progress Notes (Signed)
   12/16/23 0000  Psych Admission Type (Psych Patients Only)  Admission Status Involuntary  Psychosocial Assessment  Patient Complaints Anxiety  Eye Contact Fair  Facial Expression Animated  Affect Appropriate to circumstance  Speech Logical/coherent  Interaction Assertive  Motor Activity Slow  Appearance/Hygiene Unremarkable  Behavior Characteristics Appropriate to situation  Mood Pleasant  Thought Process  Coherency WDL  Content WDL  Delusions None reported or observed  Perception WDL  Hallucination None reported or observed  Judgment Impaired  Confusion None  Danger to Self  Current suicidal ideation? Denies  Agreement Not to Harm Self Yes  Description of Agreement verbal  Danger to Others  Danger to Others None reported or observed

## 2023-12-16 NOTE — BH IP Treatment Plan (Signed)
 Interdisciplinary Treatment and Diagnostic Plan Update  12/16/2023 Time of Session: 2:54pm  Natasha Chandler MRN: [id number]  Principal Diagnosis: MDD (major depressive disorder), recurrent, severe, with psychosis (HCC)  Secondary Diagnoses: Principal Problem:   MDD (major depressive disorder), recurrent, severe, with psychosis (HCC)   Current Medications:  Current Facility-Administered Medications  Medication Dose Route Frequency Provider Last Rate Last Admin   acetaminophen  (TYLENOL ) tablet 650 mg  650 mg Oral Q6H PRN Victoria Ruts, MD   650 mg at 12/14/23 9350   albuterol  (PROVENTIL ) (2.5 MG/3ML) 0.083% nebulizer solution 3 mL  3 mL Inhalation Q6H PRN Rainelle Pfeiffer, MD       alum & mag hydroxide-simeth (MAALOX/MYLANTA) 200-200-20 MG/5ML suspension 30 mL  30 mL Oral Q4H PRN Rainelle Pfeiffer, MD       amLODipine  (NORVASC ) tablet 10 mg  10 mg Oral Daily Rainelle Pfeiffer, MD   10 mg at 12/16/23 0904   aspirin  EC tablet 81 mg  81 mg Oral Daily Cam Charlie Loving, DO   81 mg at 12/16/23 9095   donepezil  (ARICEPT ) tablet 5 mg  5 mg Oral QHS Rainelle Pfeiffer, MD   5 mg at 12/15/23 2147   fluticasone  (FLONASE ) 50 MCG/ACT nasal spray 2 spray  2 spray Each Nare Daily PRN Rainelle Pfeiffer, MD       hydrochlorothiazide  (HYDRODIURIL ) tablet 12.5 mg  12.5 mg Oral Daily Cam Charlie Loving, DO   12.5 mg at 12/16/23 9095   insulin  aspart (novoLOG ) injection 0-5 Units  0-5 Units Subcutaneous QHS Cam Charlie Loving, DO   2 Units at 12/13/23 2133   insulin  aspart (novoLOG ) injection 0-9 Units  0-9 Units Subcutaneous TID WC Cam Charlie Loving, DO   1 Units at 12/16/23 1247   insulin  aspart (novoLOG ) injection 8 Units  8 Units Subcutaneous TID WC Cam Charlie Loving, DO   8 Units at 12/16/23 1247   levothyroxine  (SYNTHROID ) tablet 50 mcg  50 mcg Oral Q0600 Cam Charlie Loving, DO   50 mcg at 12/16/23 0630   loperamide  (IMODIUM ) capsule 2 mg  2 mg Oral PRN Parmar, Meenakshi, MD   2 mg  at 12/13/23 1254   loratadine  (CLARITIN ) tablet 10 mg  10 mg Oral Daily PRN Rainelle Pfeiffer, MD       losartan  (COZAAR ) tablet 100 mg  100 mg Oral Daily Rainelle Pfeiffer, MD   100 mg at 12/16/23 9095   magnesium  hydroxide (MILK OF MAGNESIA) suspension 30 mL  30 mL Oral Daily PRN Rainelle Pfeiffer, MD       mometasone -formoterol  (DULERA ) 200-5 MCG/ACT inhaler 2 puff  2 puff Inhalation BID Rainelle Pfeiffer, MD   2 puff at 12/16/23 9095   OLANZapine  zydis (ZYPREXA ) disintegrating tablet 5 mg  5 mg Oral TID PRN Rainelle Pfeiffer, MD       ondansetron  (ZOFRAN ) tablet 4 mg  4 mg Oral Q6H PRN Rainelle Pfeiffer, MD       Or   ondansetron  (ZOFRAN ) injection 4 mg  4 mg Intravenous Q6H PRN Rainelle Pfeiffer, MD       QUEtiapine  (SEROQUEL ) tablet 50 mg  50 mg Oral QHS Cam Charlie Loving, DO   50 mg at 12/15/23 2147   rosuvastatin  (CRESTOR ) tablet 20 mg  20 mg Oral Daily Rainelle Pfeiffer, MD   20 mg at 12/16/23 9095   senna-docusate (Senokot-S) tablet 1 tablet  1 tablet Oral QHS PRN Rainelle Pfeiffer, MD       sertraline  (ZOLOFT ) tablet 100 mg  100 mg Oral  Daily Rainelle Pfeiffer, MD   100 mg at 12/16/23 9095   PTA Medications: Medications Prior to Admission  Medication Sig Dispense Refill Last Dose/Taking   albuterol  (PROVENTIL  HFA;VENTOLIN  HFA) 108 (90 Base) MCG/ACT inhaler Inhale 1-2 puffs into the lungs every 6 (six) hours as needed for wheezing or shortness of breath.       amLODipine  (NORVASC ) 10 MG tablet Take 10 mg by mouth in the morning.      aspirin  81 MG chewable tablet Chew 1 tablet (81 mg total) by mouth daily.      diclofenac  (VOLTAREN ) 50 MG EC tablet Take 50 mg by mouth 2 (two) times daily.      donepezil  (ARICEPT ) 5 MG tablet Take 5 mg by mouth at bedtime.      fluticasone  (FLONASE ) 50 MCG/ACT nasal spray Place 2 sprays into both nostrils daily as needed for allergies.      hydrochlorothiazide  (HYDRODIURIL ) 12.5 MG tablet Take 12.5 mg by mouth daily.      Insulin  Aspart FlexPen (NOVOLOG ) 100  UNIT/ML Inject 3 Units into the skin 3 (three) times daily with meals.      insulin  degludec (TRESIBA ) 100 UNIT/ML FlexTouch Pen Inject 10 Units into the skin daily.      Insulin  Pen Needle 31G X 5 MM MISC Use to inject insulin  once daily 30 each 5    levothyroxine  (SYNTHROID ) 50 MCG tablet Take 50 mcg by mouth daily before breakfast.      lip balm (CARMEX) ointment Apply topically as needed.      loratadine  (CLARITIN ) 10 MG tablet Take 1 tablet (10 mg total) by mouth daily as needed for allergies.      losartan  (COZAAR ) 100 MG tablet Take 1 tablet (100 mg total) by mouth daily. (Patient taking differently: Take 100 mg by mouth at bedtime.)      pioglitazone  (ACTOS ) 45 MG tablet Take 45 mg by mouth daily.      risperiDONE  (RISPERDAL  M-TABS) 0.5 MG disintegrating tablet Take 1 tablet (0.5 mg total) by mouth at bedtime.      rosuvastatin  (CRESTOR ) 20 MG tablet Take 20 mg by mouth at bedtime.      Semaglutide , 2 MG/DOSE, (OZEMPIC , 2 MG/DOSE,) 8 MG/3ML SOPN Inject 0.75 mLs (2 mg total) into the skin every 7 days. 9 mL 1    sertraline  (ZOLOFT ) 100 MG tablet Take 1 tablet (100 mg total) by mouth daily.      Vitamin D , Ergocalciferol , (DRISDOL) 1.25 MG (50000 UNIT) CAPS capsule Take 50,000 Units by mouth every Sunday. Thursdays       Patient Stressors: Health problems   Marital or family conflict    Patient Strengths: Ability for insight  Capable of independent living  Motivation for treatment/growth  Supportive family/friends   Treatment Modalities: Medication Management, Group therapy, Case management,  1 to 1 session with clinician, Psychoeducation, Recreational therapy.   Physician Treatment Plan for Primary Diagnosis: MDD (major depressive disorder), recurrent, severe, with psychosis (HCC) Long Term Goal(s): Improvement in symptoms so as ready for discharge   Short Term Goals: Ability to identify changes in lifestyle to reduce recurrence of condition will improve Ability to verbalize  feelings will improve Ability to disclose and discuss suicidal ideas Ability to demonstrate self-control will improve Ability to identify and develop effective coping behaviors will improve Ability to maintain clinical measurements within normal limits will improve Compliance with prescribed medications will improve Ability to identify triggers associated with substance abuse/mental health issues will improve  Medication  Management: Evaluate patient's response, side effects, and tolerance of medication regimen.  Therapeutic Interventions: 1 to 1 sessions, Unit Group sessions and Medication administration.  Evaluation of Outcomes: Progressing  Physician Treatment Plan for Secondary Diagnosis: Principal Problem:   MDD (major depressive disorder), recurrent, severe, with psychosis (HCC)  Long Term Goal(s): Improvement in symptoms so as ready for discharge   Short Term Goals: Ability to identify changes in lifestyle to reduce recurrence of condition will improve Ability to verbalize feelings will improve Ability to disclose and discuss suicidal ideas Ability to demonstrate self-control will improve Ability to identify and develop effective coping behaviors will improve Ability to maintain clinical measurements within normal limits will improve Compliance with prescribed medications will improve Ability to identify triggers associated with substance abuse/mental health issues will improve     Medication Management: Evaluate patient's response, side effects, and tolerance of medication regimen.  Therapeutic Interventions: 1 to 1 sessions, Unit Group sessions and Medication administration.  Evaluation of Outcomes: Progressing   RN Treatment Plan for Primary Diagnosis: MDD (major depressive disorder), recurrent, severe, with psychosis (HCC) Long Term Goal(s): Knowledge of disease and therapeutic regimen to maintain health will improve  Short Term Goals: Ability to remain free from  injury will improve, Ability to verbalize frustration and anger appropriately will improve, Ability to demonstrate self-control, Ability to participate in decision making will improve, Ability to verbalize feelings will improve, Ability to disclose and discuss suicidal ideas, Ability to identify and develop effective coping behaviors will improve, and Compliance with prescribed medications will improve  Medication Management: RN will administer medications as ordered by provider, will assess and evaluate patient's response and provide education to patient for prescribed medication. RN will report any adverse and/or side effects to prescribing provider.  Therapeutic Interventions: 1 on 1 counseling sessions, Psychoeducation, Medication administration, Evaluate responses to treatment, Monitor vital signs and CBGs as ordered, Perform/monitor CIWA, COWS, AIMS and Fall Risk screenings as ordered, Perform wound care treatments as ordered.  Evaluation of Outcomes: Progressing   LCSW Treatment Plan for Primary Diagnosis: MDD (major depressive disorder), recurrent, severe, with psychosis (HCC) Long Term Goal(s): Safe transition to appropriate next level of care at discharge, Engage patient in therapeutic group addressing interpersonal concerns.  Short Term Goals: Engage patient in aftercare planning with referrals and resources, Increase social support, Increase ability to appropriately verbalize feelings, Increase emotional regulation, Facilitate acceptance of mental health diagnosis and concerns, Facilitate patient progression through stages of change regarding substance use diagnoses and concerns, and Increase skills for wellness and recovery  Therapeutic Interventions: Assess for all discharge needs, 1 to 1 time with Social worker, Explore available resources and support systems, Assess for adequacy in community support network, Educate family and significant other(s) on suicide prevention, Complete  Psychosocial Assessment, Interpersonal group therapy.  Evaluation of Outcomes: Progressing   Progress in Treatment: Attending groups: Yes. Participating in groups: Yes. Taking medication as prescribed: Yes. Toleration medication: Yes. Family/Significant other contact made: Yes, individual(s) contacted:  Shilee Biggs, aunt, [phone]) on 11/28/2023 Patient understands diagnosis: Yes. Discussing patient identified problems/goals with staff: Yes. Medical problems stabilized or resolved: Yes. Denies suicidal/homicidal ideation: Yes. Issues/concerns per patient self-inventory: Yes. Other:   New problem(s) identified: No, Describe:  None identified 12/01/23 Update: Pt is mildly confused but pleasant. Update 12/06/23: No changes at this time. Update: 12/16/2023: No changes at this time.   New Short Term/Long Term Goal(s):elimination of symptoms of psychosis, medication management for mood stabilization; elimination of SI thoughts; development of comprehensive mental  wellness plan. 12/01/23 Update: Goal to remain the same. Update 12/06/23: No changes at this time Update 12/11/23: No changes at this time.  Update: 12/16/2023: No changes at this time.  Update: 12/16/2023: No changes at this time.   Patient Goals:  Besides getting back to my family, getting back to my life, I have a two bedroom house 12/01/23 Update: Pt goal to remain the same. Update 12/06/23: No changes at this time Update 12/11/23: No changes at this time.  Update: 12/16/2023: No changes at this time.     Discharge Plan or Barriers: CSW will assist with appropriate discharge planning  12/01/23 Update: Discharge plan to remain the same. Update 12/06/23: Pt's sister is getting guardianship of pt and has a court date on 12/28/23. Update 12/11/23: Pt was served guardianship paperwork. Pt has guardianship hearing on 12/28/23.  Update: 12/16/2023: No changes at this time.   Reason for Continuation of Hospitalization: Depression Medication  stabilization 12/01/23 Update: Depression Medication stabilization. Update 12/06/23: No changes at this time Update 12/11/23: No changes at this time .  Update: 12/16/2023: No changes at this time.   Estimated Length of Stay:1 to 7 days 12/01/23 Update: TBD Update 12/06/23: TBD Update 12/11/23: TBD.  Update: 12/16/2023: TBD    Last 3 [place] Suicide Severity Risk Score: Flowsheet Row Admission (Current) from 11/25/2023 in [facility] ED to Hosp-Admission (Discharged) from 11/16/2023 in [facility] ED from 08/09/2023 in [facility] Emergency Department at [facility]  C-SSRS RISK CATEGORY No Risk No Risk No Risk       Last PHQ 2/9 Scores:    08/13/2020    4:59 PM 08/04/2020    3:16 PM 12/15/2015    7:07 PM  Depression screen PHQ 2/9  Decreased Interest 1 1 0  Down, Depressed, Hopeless 2 2 0  PHQ - 2 Score 3 3 0  Altered sleeping 3 3   Tired, decreased energy 2 2   Change in appetite 2 2   Feeling bad or failure about yourself  1 1   Trouble concentrating 2 2   Moving slowly or fidgety/restless 2 2   Suicidal thoughts 0 0   PHQ-9 Score 15 15   Difficult doing work/chores Somewhat difficult Somewhat difficult     Scribe for Treatment Team: Aldo CHRISTELLA Dorthy ISRAEL 12/16/2023 2:54 PM

## 2023-12-16 NOTE — Progress Notes (Signed)
   12/16/23 1100  Psych Admission Type (Psych Patients Only)  Admission Status Involuntary  Psychosocial Assessment  Patient Complaints Anxiety  Eye Contact Fair  Facial Expression Animated  Affect Appropriate to circumstance  Speech Logical/coherent  Interaction Assertive  Motor Activity Slow  Appearance/Hygiene Unremarkable  Behavior Characteristics Appropriate to situation  Mood Pleasant  Thought Process  Coherency WDL  Content WDL  Delusions None reported or observed  Perception WDL  Hallucination None reported or observed  Judgment Impaired  Confusion None  Danger to Self  Current suicidal ideation? Denies  Agreement Not to Harm Self Yes  Description of Agreement verbal  Danger to Others  Danger to Others None reported or observed

## 2023-12-17 LAB — GLUCOSE, CAPILLARY
Glucose-Capillary: 168 mg/dL — ABNORMAL HIGH (ref 70–99)
Glucose-Capillary: 107 mg/dL — ABNORMAL HIGH (ref 70–99)
Glucose-Capillary: 92 mg/dL (ref 70–99)
Glucose-Capillary: 70 mg/dL (ref 70–99)

## 2023-12-17 MED ORDER — CARMEX CLASSIC LIP BALM EX OINT: TOPICAL_OINTMENT | CUTANEOUS | Status: DC | PRN

## 2023-12-17 MED ORDER — BLISTEX MEDICATED EX OINT: 1.0000 | TOPICAL_OINTMENT | CUTANEOUS | Status: DC | PRN

## 2023-12-17 NOTE — Plan of Care (Signed)
  Problem: Education: Goal: Utilization of techniques to improve thought processes will improve Outcome: Progressing Goal: Knowledge of the prescribed therapeutic regimen will improve Outcome: Progressing   Problem: Education: Goal: Ability to state activities that reduce stress will improve Outcome: Progressing   Problem: Coping: Goal: Ability to verbalize frustrations and anger appropriately will improve Outcome: Progressing   Problem: Safety: Goal: Periods of time without injury will increase Outcome: Progressing

## 2023-12-17 NOTE — Progress Notes (Signed)
   12/17/23 0600  15 Minute Checks  Location Bedroom  Visual Appearance Calm  Behavior Sleeping  Sleep (Behavioral Health Patients Only)  Calculate sleep? (Click Yes once per 24 hr at 0600 safety check) Yes  Documented sleep last 24 hours 8
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
## 2023-12-17 NOTE — Plan of Care (Signed)
  Problem: Education: Goal: Utilization of techniques to improve thought processes will improve Outcome: Not Progressing Goal: Knowledge of the prescribed therapeutic regimen will improve Outcome: Not Progressing

## 2023-12-17 NOTE — Progress Notes (Signed)
   12/16/23 2000  Psych Admission Type (Psych Patients Only)  Admission Status Involuntary  Psychosocial Assessment  Patient Complaints Anxiety  Eye Contact Fair  Facial Expression Animated  Affect Appropriate to circumstance  Speech Logical/coherent  Interaction Assertive  Motor Activity Slow  Appearance/Hygiene Unremarkable  Behavior Characteristics Appropriate to situation  Mood Pleasant  Thought Process  Coherency WDL  Content WDL  Delusions None reported or observed  Perception WDL  Hallucination None reported or observed  Judgment Impaired  Confusion Mild  Danger to Self  Current suicidal ideation? Denies   Pt is alert and oriented. Pleasant. Visible on the unit. Po med compliant. Denies SI/HI/AVH. No c/o pain/discomfort noted.

## 2023-12-17 NOTE — Group Note (Signed)
 Recreation Therapy Group Note   Group Topic:General Recreation  Group Date: 12/17/2023 Start Time: 1400 End Time: 1500 Facilitators: Celestia Jeoffrey BRAVO, LRT, CTRS Location:  Dayroom  Group Description: Bingo. LRT and patients played multiple games of Bingo with music playing in the background. LRT and pts discussed how this could be a leisure interest and the importance of doing things they enjoy post-discharge. Pts won stress balls as chief financial officer.   Goal Area(s) Addressed: Patient will identify leisure interests.  Patient will practice healthy decision making. Patient will engage in recreation activity.  Patient will increase communication.      Affect/Mood: N/A   Participation Level: Did not attend    Clinical Observations/Individualized Feedback: Natasha Chandler was in and out of group multiple times. Pt did not play bingo.   Plan: Continue to engage patient in RT group sessions 2-3x/week.   Jeoffrey BRAVO Celestia, LRT, CTRS 12/17/2023 4:52 PM

## 2023-12-17 NOTE — Progress Notes (Signed)
 Patient admitted IVC on 12.22.24 to [facility] for bizarre behavior and increased confusion. Dx: major depressive disorder.    Patient presents as pleasant with mild confusion noted. Denies SI/HI/AVH. She denies anxiety and depression. Patient actively engaged in rec therapy and appropriately interacted with her peers. Patient tearful at times whenever the subject of the relationship her sister comes up. Support provided. Patient remains on CBG monitoring QID.  Q15 minute unit checks in place.

## 2023-12-17 NOTE — Group Note (Signed)
 Date:  12/17/2023 Time:  9:18 PM  Group Topic/Focus:  Wrap-Up Group:   The focus of this group is to help patients review their daily goal of treatment and discuss progress on daily workbooks.    Participation Level:  Active  Participation Quality:  Appropriate  Affect:  Appropriate  Cognitive:  Appropriate  Insight: Appropriate  Engagement in Group:  Engaged  Modes of Intervention:  Discussion  Additional Comments:    Tommas CHRISTELLA Bunker 12/17/2023, 9:18 PM

## 2023-12-18 DIAGNOSIS — F333 Major depressive disorder, recurrent, severe with psychotic symptoms: Secondary | ICD-10-CM | POA: Diagnosis not present

## 2023-12-18 LAB — GLUCOSE, CAPILLARY
Glucose-Capillary: 162 mg/dL — ABNORMAL HIGH (ref 70–99)
Glucose-Capillary: 191 mg/dL — ABNORMAL HIGH (ref 70–99)
Glucose-Capillary: 102 mg/dL — ABNORMAL HIGH (ref 70–99)
Glucose-Capillary: 100 mg/dL — ABNORMAL HIGH (ref 70–99)

## 2023-12-18 NOTE — BHH Counselor (Signed)
 CSW contacted pt's sister Tashanda Fuhrer at [phone]  Barnie reports that she has called the billing department, they have given her pushback on pt's insurance because she is not the pt's guardian.   Barnie reports, It's one of the most frustrating things that I've had to deal with in my life    Reports that she has called the [employer] but that she can't get the new card because she is not really pt's guardian.   Reports that she has asked for the card to be mailed to the hospital so she is hoping that is what will occur.   CSW informed pt's sister that pt will be discharging Thursday or Friday and that she should be prepared for pt to come home, given that pt does not meet criteria for in-patient psychiatric hospitalization.   Pt's sister reports concerns about pt returning home due to symptoms of dementia.   Pt's sister reports, I'm filing for guardianship, because I'm putting her in a nursing home. She is a disaster waiting to happen.  CSW provided understanding and support but requested a call back so that Barnie can call her job to request time off to come get the pt.   CSW awaiting return call.   Lum Croft, MSW, [place] 12/19/2023 4:44 PM

## 2023-12-18 NOTE — Plan of Care (Signed)
   Problem: Education: Goal: Knowledge of [facility] Education information/materials will improve Outcome: Progressing Goal: Emotional status will improve Outcome: Progressing Goal: Mental status will improve Outcome: Progressing

## 2023-12-18 NOTE — Group Note (Signed)
 Recreation Therapy Group Note   Group Topic:Stress Management  Group Date: 12/18/2023 Start Time: 1400 End Time: 1500 Facilitators: Celestia Jeoffrey BRAVO, LRT, CTRS Location:  Dayroom  Group Description: Taboo. LRT and patients played the game Taboo. The object of the game is to have peers guess the word up at the top of the card drawn that is in bold, while being sure to not use any of the descriptive words down below it on the same card. If the person attempting to explain the word in bold uses any of the descriptive words down below, they lose their turn, and no one receives that card or point. LRT and patient's took turns being the one to describe the words while the rest of the group tried to guess what they were describing. The person with the most cards from correct guesses at the end, wins.   Goal Area(s) Addressed: Patient will identify physical symptoms of stress. Patient will identify emotional symptoms of stress. Patient will identify coping skills for stress. Patient will build frustration tolerance skills.  Patient will increase communication.   Affect/Mood: Appropriate   Participation Level: Active and Engaged   Participation Quality: Independent   Behavior: Calm and Cooperative   Speech/Thought Process: Loose association   Insight: Fair   Judgement: Fair    Modes of Intervention: Neurosurgeon, Education, Exploration, Guided Discussion, and Rapport Building   Patient Response to Interventions:  Attentive and Receptive   Education Outcome:  Acknowledges education   Clinical Observations/Individualized Feedback: Natasha Chandler was active in their participation of session activities and group discussion. Pt identified feeling anxious as a sign of stress. Pt interacted well with LRT and peers duration of session.    Plan: Continue to engage patient in RT group sessions 2-3x/week.   Jeoffrey BRAVO Celestia, LRT, CTRS 12/18/2023 5:00 PM

## 2023-12-18 NOTE — Group Note (Signed)
 Date:  12/18/2023 Time:  10:51 PM  Group Topic/Focus:  Wrap-Up Group:   The focus of this group is to help patients review their daily goal of treatment and discuss progress on daily workbooks.    Participation Level:  Active  Participation Quality:  Appropriate  Affect:  Appropriate  Cognitive:  Oriented  Insight: Improving  Engagement in Group:  Improving  Modes of Intervention:  Discussion  Additional Comments:    Natasha Chandler 12/18/2023, 10:51 PM

## 2023-12-18 NOTE — Progress Notes (Signed)
   12/18/23 0100  Psych Admission Type (Psych Patients Only)  Admission Status Involuntary  Psychosocial Assessment  Patient Complaints Anxiety  Eye Contact Fair  Affect Appropriate to circumstance  Speech Logical/coherent  Interaction Assertive  Motor Activity Slow  Appearance/Hygiene Unremarkable  Behavior Characteristics Appropriate to situation  Mood Pleasant  Thought Process  Coherency WDL  Content WDL  Delusions None reported or observed  Perception WDL  Hallucination None reported or observed  Judgment Impaired  Confusion Mild  Danger to Self  Current suicidal ideation? Denies  Danger to Others  Danger to Others None reported or observed
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
## 2023-12-18 NOTE — Progress Notes (Signed)
 Patient is pleasant and cooperative.  Animated affect.  Denies SI/HI and AVH.  Denies anxiety and depression.  Denies pain.  Reports she slept well.   Compliant with scheduled medications. 15 min checks in place for safety.  Patient is present in the milieu.  Appropriate interaction with peers.  Patient often walks in the hallways for exercise.

## 2023-12-19 DIAGNOSIS — F333 Major depressive disorder, recurrent, severe with psychotic symptoms: Secondary | ICD-10-CM | POA: Diagnosis not present

## 2023-12-19 LAB — GLUCOSE, CAPILLARY
Glucose-Capillary: 166 mg/dL — ABNORMAL HIGH (ref 70–99)
Glucose-Capillary: 195 mg/dL — ABNORMAL HIGH (ref 70–99)
Glucose-Capillary: 107 mg/dL — ABNORMAL HIGH (ref 70–99)
Glucose-Capillary: 134 mg/dL — ABNORMAL HIGH (ref 70–99)

## 2023-12-19 NOTE — Group Note (Signed)
 Date:  12/19/2023 Time:  11:20 AM  Group Topic/Focus:  Goals Group:   The focus of this group is to help patients establish daily goals to achieve during treatment and discuss how the patient can incorporate goal setting into their daily lives to aide in recovery.    Participation Level:  Active  Participation Quality:  Appropriate  Affect:  Appropriate  Cognitive:  Appropriate  Insight: Appropriate  Engagement in Group:  Engaged  Modes of Intervention:  Discussion  Dow Gemma 12/19/2023, 11:20 AM
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
## 2023-12-19 NOTE — Progress Notes (Signed)
   12/19/23 1400  Psych Admission Type (Psych Patients Only)  Admission Status Involuntary  Psychosocial Assessment  Patient Complaints None  Eye Contact Fair  Facial Expression Animated  Affect Appropriate to circumstance  Speech Logical/coherent  Interaction Assertive  Motor Activity Slow  Appearance/Hygiene Unremarkable  Behavior Characteristics Cooperative  Mood Pleasant  Thought Process  Coherency WDL  Content WDL  Delusions None reported or observed  Perception WDL  Hallucination None reported or observed  Judgment Limited  Confusion None  Danger to Self  Current suicidal ideation? Denies  Agreement Not to Harm Self Yes  Description of Agreement verbal  Danger to Others  Danger to Others None reported or observed

## 2023-12-19 NOTE — Plan of Care (Signed)
  Problem: Education: Goal: Ability to state activities that reduce stress will improve Outcome: Progressing   Problem: Education: Goal: Emotional status will improve Outcome: Progressing Goal: Mental status will improve Outcome: Progressing   Problem: Activity: Goal: Interest or engagement in activities will improve Outcome: Progressing Goal: Sleeping patterns will improve Outcome: Progressing   Problem: Safety: Goal: Periods of time without injury will increase Outcome: Progressing   Problem: Coping: Goal: Ability to adjust to condition or change in health will improve Outcome: Progressing

## 2023-12-19 NOTE — Plan of Care (Signed)
   Problem: Education: Goal: Utilization of techniques to improve thought processes will improve Outcome: Progressing Goal: Knowledge of the prescribed therapeutic regimen will improve Outcome: Progressing
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
## 2023-12-19 NOTE — Group Note (Signed)
 Date:  12/19/2023 Time:  3:21 PM  Group Topic/Focus:  Self Care:   The focus of this group is to help patients understand the importance of self care while doing a self care crossword puzzle. Also listening to relaxing music    Participation Level:  Active  Participation Quality:  Appropriate  Affect:  Appropriate  Cognitive:  Appropriate  Insight: Appropriate  Engagement in Group:  Engaged  Modes of Intervention:  Activity and Discussion  Additional Comments:    Linnell Richardson 12/19/2023, 3:21 PM

## 2023-12-19 NOTE — Progress Notes (Signed)
   12/18/23 2000  Psych Admission Type (Psych Patients Only)  Admission Status Involuntary  Psychosocial Assessment  Patient Complaints None  Eye Contact Fair  Facial Expression Animated  Affect Appropriate to circumstance  Speech Logical/coherent  Interaction Assertive  Motor Activity Slow  Appearance/Hygiene Unremarkable  Behavior Characteristics Cooperative;Appropriate to situation  Mood Pleasant  Thought Process  Coherency WDL  Content WDL  Delusions None reported or observed  Perception WDL  Hallucination None reported or observed  Judgment Limited  Confusion None  Danger to Self  Current suicidal ideation? Denies  Agreement Not to Harm Self Yes  Description of Agreement verbal  Danger to Others  Danger to Others None reported or observed   Progress note   D: Pt seen in the hallway. Pt denies SI, HI, AVH. Pt rates pain  0/10. Pt rates anxiety  0/10 and depression  0/10. Pt states she needs to lose weight so she has been walking the halls during the day to increase her physical activity. Pt states she is ready to return home. Active in milieu. Attending groups. Eating and sleeping well. No other concerns noted at this time.  A: Pt provided support and encouragement. Pt given scheduled medication as prescribed. PRNs as appropriate. Q15 min checks for safety.   R: Pt safe on the unit. Will continue to monitor.

## 2023-12-20 DIAGNOSIS — F333 Major depressive disorder, recurrent, severe with psychotic symptoms: Secondary | ICD-10-CM | POA: Diagnosis not present

## 2023-12-20 LAB — GLUCOSE, CAPILLARY
Glucose-Capillary: 203 mg/dL — ABNORMAL HIGH (ref 70–99)
Glucose-Capillary: 71 mg/dL (ref 70–99)
Glucose-Capillary: 106 mg/dL — ABNORMAL HIGH (ref 70–99)
Glucose-Capillary: 82 mg/dL (ref 70–99)

## 2023-12-20 NOTE — [facility] Counselor (Signed)
CSW attempted to contact pt's sister Kaleigha Whan at [phone].  CSW left HIPAA compliant voicemail.  Penni Homans, MSW, LCSW 12/20/2023 1:32 PM

## 2023-12-20 NOTE — Group Note (Signed)
Recreation Therapy Group Note   Group Topic:Team Building  Group Date: 12/20/2023 Start Time: 1400 End Time: 1500 Facilitators: Rosina Lowenstein, LRT, CTRS Location:  Dayroom  Group Description: Scattergories. LRT and pts played rounds of the game Scattergories while divided up in two groups. Scattergories requires you to think and write fast, communicate clearly, and work together all while under a time restraint. LRT and peers discussed the importance of working well with others, communicating effectively, and being a part of a team. LRT and pts discussed how this can be used post discharge.   Goal Areas Addressed:  Patient will engage in competitive recreational game.  Patient will increase communication skills.  Patient will practice being a part of a team.   Affect/Mood: Appropriate   Participation Level: Active and Engaged   Participation Quality: Independent   Behavior: Appropriate, Calm, and Cooperative   Speech/Thought Process: Coherent   Insight: Fair   Judgement: Fair    Modes of Intervention: Competitive Play, Group work, and Team-building   Patient Response to Interventions:  Attentive, Engaged, and Interested    Education Outcome:  Acknowledges education   Clinical Observations/Individualized Feedback: Natasha Chandler was active in their participation of session activities and group discussion. Pt shared that she had a difficult time reading the smaller print on the playing cards. Pt interacted well and seemed more coherent and appropriately engaged in group today. Overall, pt interacted well with LRT and peers duration of session.    Plan: Continue to engage patient in RT group sessions 2-3x/week.   Rosina Lowenstein, LRT, CTRS 12/20/2023 4:53 PM

## 2023-12-20 NOTE — Progress Notes (Signed)
   12/20/23 1100  Psych Admission Type (Psych Patients Only)  Admission Status Involuntary  Psychosocial Assessment  Patient Complaints None  Eye Contact Fair  Facial Expression Animated  Affect Appropriate to circumstance  Speech Logical/coherent  Interaction Assertive  Motor Activity Slow  Appearance/Hygiene Unremarkable  Behavior Characteristics Cooperative  Mood Pleasant  Thought Process  Coherency WDL  Content WDL  Delusions None reported or observed  Perception WDL  Hallucination None reported or observed  Judgment Limited  Confusion None  Danger to Self  Current suicidal ideation? Denies  Agreement Not to Harm Self Yes  Description of Agreement verbal  Danger to Others  Danger to Others None reported or observed

## 2023-12-20 NOTE — Progress Notes (Signed)
   12/20/23 0600  15 Minute Checks  Location Bedroom  Visual Appearance Calm  Behavior Sleeping  Sleep (Behavioral Health Patients Only)  Calculate sleep? (Click Yes once per 24 hr at 0600 safety check) Yes  Documented sleep last 24 hours 8.25

## 2023-12-20 NOTE — Progress Notes (Addendum)
  Patient is alert and oriented . Pt denies  Anxiety,depression,  SI/HI/AVH as well as pain , except a little soreness on her buttocks. Pt attended groups and is pleasant and sociable with staff and peers.  Pt is compliant with medication and cooperative with treatment plan. Pt attended groups. Will keep monitoring patient and providing support throughout the rest of the night.
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
## 2023-12-20 NOTE — Group Note (Signed)
Date:  12/20/2023 Time:  11:17 AM  Group Topic/Focus:  Identifying Needs:   The focus of this group is to help patients identify their personal needs that have been historically problematic and identify healthy behaviors to address their needs.    Participation Level:  Active  Participation Quality:  Attentive, Sharing, and Supportive  Affect:  Appropriate  Cognitive:  Appropriate  Insight: Appropriate  Engagement in Group:  Engaged  Modes of Intervention:  Education  Additional Comments:     Alexis Frock 12/20/2023, 11:17 AM

## 2023-12-20 NOTE — Plan of Care (Signed)
  Problem: Education: Goal: Ability to state activities that reduce stress will improve Outcome: Progressing   Problem: Education: Goal: Emotional status will improve Outcome: Progressing Goal: Mental status will improve Outcome: Progressing   Problem: Activity: Goal: Interest or engagement in activities will improve Outcome: Progressing Goal: Sleeping patterns will improve Outcome: Progressing

## 2023-12-20 NOTE — Plan of Care (Signed)
  Problem: Education: Goal: Emotional status will improve Outcome: Progressing Goal: Mental status will improve Outcome: Progressing   
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
## 2023-12-21 DIAGNOSIS — F333 Major depressive disorder, recurrent, severe with psychotic symptoms: Secondary | ICD-10-CM | POA: Diagnosis not present

## 2023-12-21 LAB — GLUCOSE, CAPILLARY
Glucose-Capillary: 141 mg/dL — ABNORMAL HIGH (ref 70–99)
Glucose-Capillary: 120 mg/dL — ABNORMAL HIGH (ref 70–99)
Glucose-Capillary: 148 mg/dL — ABNORMAL HIGH (ref 70–99)
Glucose-Capillary: 106 mg/dL — ABNORMAL HIGH (ref 70–99)
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
## 2023-12-21 NOTE — Progress Notes (Signed)
   12/21/23 1200  Psych Admission Type (Psych Patients Only)  Admission Status Involuntary  Psychosocial Assessment  Patient Complaints None  Eye Contact Fair  Facial Expression Animated  Affect Appropriate to circumstance  Speech Logical/coherent  Interaction Assertive  Motor Activity Slow  Appearance/Hygiene Unremarkable  Behavior Characteristics Cooperative  Mood Pleasant  Thought Process  Coherency WDL  Content WDL  Delusions None reported or observed  Perception WDL  Hallucination None reported or observed  Judgment Limited  Confusion None  Danger to Self  Current suicidal ideation? Denies  Agreement Not to Harm Self Yes  Description of Agreement verbal  Danger to Others  Danger to Others None reported or observed
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
## 2023-12-21 NOTE — Group Note (Signed)
Date:  12/21/2023 Time:  12:50 PM  Group Topic/Focus:  Self Care:   The focus of this group is to help patients understand the importance of self-care in order to improve or restore emotional, physical, spiritual, interpersonal, and financial health.    Participation Level:  Active  Participation Quality:  Appropriate  Affect:  Appropriate  Cognitive:  Appropriate  Insight: Appropriate  Engagement in Group:  Engaged  Modes of Intervention:  Discussion   Ardelle Anton 12/21/2023, 12:50 PM

## 2023-12-21 NOTE — BH IP Treatment Plan (Signed)
Interdisciplinary Treatment and Diagnostic Plan Update  12/21/2023 Time of Session: 9:30AM Natasha Chandler MRN: [id number]  Principal Diagnosis: MDD (major depressive disorder), recurrent, severe, with psychosis (HCC)  Secondary Diagnoses: Principal Problem:   MDD (major depressive disorder), recurrent, severe, with psychosis (HCC)   Current Medications:  Current Facility-Administered Medications  Medication Dose Route Frequency Provider Last Rate Last Admin   acetaminophen (TYLENOL) tablet 650 mg  650 mg Oral Q6H PRN Lewanda Rife, MD   650 mg at 12/14/23 0649   albuterol (PROVENTIL) (2.5 MG/3ML) 0.083% nebulizer solution 3 mL  3 mL Inhalation Q6H PRN Lamar Sprinkles, MD       alum & mag hydroxide-simeth (MAALOX/MYLANTA) 200-200-20 MG/5ML suspension 30 mL  30 mL Oral Q4H PRN Lamar Sprinkles, MD       amLODipine (NORVASC) tablet 10 mg  10 mg Oral Daily Lamar Sprinkles, MD   10 mg at 12/21/23 4098   aspirin EC tablet 81 mg  81 mg Oral Daily Sarina Ill, DO   81 mg at 12/21/23 1191   donepezil (ARICEPT) tablet 5 mg  5 mg Oral QHS Lamar Sprinkles, MD   5 mg at 12/20/23 2132   fluticasone (FLONASE) 50 MCG/ACT nasal spray 2 spray  2 spray Each Nare Daily PRN Lamar Sprinkles, MD       hydrochlorothiazide (HYDRODIURIL) tablet 12.5 mg  12.5 mg Oral Daily Sarina Ill, DO   12.5 mg at 12/21/23 4782   insulin aspart (novoLOG) injection 0-5 Units  0-5 Units Subcutaneous QHS Sarina Ill, DO   2 Units at 12/13/23 2133   insulin aspart (novoLOG) injection 0-9 Units  0-9 Units Subcutaneous TID WC Sarina Ill, DO   1 Units at 12/21/23 0847   insulin aspart (novoLOG) injection 8 Units  8 Units Subcutaneous TID WC Sarina Ill, DO   8 Units at 12/21/23 0847   levothyroxine (SYNTHROID) tablet 50 mcg  50 mcg Oral Q0600 Sarina Ill, DO   50 mcg at 12/21/23 0725   lip balm (BLISTEX) ointment 1 Application  1 Application Topical PRN Rockwell Alexandria, RPH       loperamide (IMODIUM) capsule 2 mg  2 mg Oral PRN Lewanda Rife, MD   2 mg at 12/13/23 1254   loratadine (CLARITIN) tablet 10 mg  10 mg Oral Daily PRN Lamar Sprinkles, MD       losartan (COZAAR) tablet 100 mg  100 mg Oral Daily Lamar Sprinkles, MD   100 mg at 12/21/23 9562   magnesium hydroxide (MILK OF MAGNESIA) suspension 30 mL  30 mL Oral Daily PRN Lamar Sprinkles, MD       mometasone-formoterol (DULERA) 200-5 MCG/ACT inhaler 2 puff  2 puff Inhalation BID Lamar Sprinkles, MD   2 puff at 12/21/23 0918   OLANZapine zydis (ZYPREXA) disintegrating tablet 5 mg  5 mg Oral TID PRN Lamar Sprinkles, MD   5 mg at 12/20/23 2132   ondansetron (ZOFRAN) tablet 4 mg  4 mg Oral Q6H PRN Lamar Sprinkles, MD       Or   ondansetron (ZOFRAN) injection 4 mg  4 mg Intravenous Q6H PRN Lamar Sprinkles, MD       QUEtiapine (SEROQUEL) tablet 50 mg  50 mg Oral QHS Sarina Ill, DO   50 mg at 12/20/23 2134   rosuvastatin (CRESTOR) tablet 20 mg  20 mg Oral Daily Lamar Sprinkles, MD   20 mg at 12/21/23 0917   senna-docusate (Senokot-S) tablet 1 tablet  1  tablet Oral QHS PRN Lamar Sprinkles, MD       sertraline (ZOLOFT) tablet 100 mg  100 mg Oral Daily Lamar Sprinkles, MD   100 mg at 12/21/23 2706   PTA Medications: Medications Prior to Admission  Medication Sig Dispense Refill Last Dose/Taking   albuterol (PROVENTIL HFA;VENTOLIN HFA) 108 (90 Base) MCG/ACT inhaler Inhale 1-2 puffs into the lungs every 6 (six) hours as needed for wheezing or shortness of breath.       amLODipine (NORVASC) 10 MG tablet Take 10 mg by mouth in the morning.      aspirin 81 MG chewable tablet Chew 1 tablet (81 mg total) by mouth daily.      diclofenac (VOLTAREN) 50 MG EC tablet Take 50 mg by mouth 2 (two) times daily.      donepezil (ARICEPT) 5 MG tablet Take 5 mg by mouth at bedtime.      fluticasone (FLONASE) 50 MCG/ACT nasal spray Place 2 sprays into both nostrils daily as needed for allergies.       hydrochlorothiazide (HYDRODIURIL) 12.5 MG tablet Take 12.5 mg by mouth daily.      Insulin Aspart FlexPen (NOVOLOG) 100 UNIT/ML Inject 3 Units into the skin 3 (three) times daily with meals.      insulin degludec (TRESIBA) 100 UNIT/ML FlexTouch Pen Inject 10 Units into the skin daily.      Insulin Pen Needle 31G X 5 MM MISC Use to inject insulin once daily 30 each 5    levothyroxine (SYNTHROID) 50 MCG tablet Take 50 mcg by mouth daily before breakfast.      lip balm (CARMEX) ointment Apply topically as needed.      loratadine (CLARITIN) 10 MG tablet Take 1 tablet (10 mg total) by mouth daily as needed for allergies.      losartan (COZAAR) 100 MG tablet Take 1 tablet (100 mg total) by mouth daily. (Patient taking differently: Take 100 mg by mouth at bedtime.)      pioglitazone (ACTOS) 45 MG tablet Take 45 mg by mouth daily.      risperiDONE (RISPERDAL M-TABS) 0.5 MG disintegrating tablet Take 1 tablet (0.5 mg total) by mouth at bedtime.      rosuvastatin (CRESTOR) 20 MG tablet Take 20 mg by mouth at bedtime.      Semaglutide, 2 MG/DOSE, (OZEMPIC, 2 MG/DOSE,) 8 MG/3ML SOPN Inject 0.75 mLs (2 mg total) into the skin every 7 days. 9 mL 1    sertraline (ZOLOFT) 100 MG tablet Take 1 tablet (100 mg total) by mouth daily.      Vitamin D, Ergocalciferol, (DRISDOL) 1.25 MG (50000 UNIT) CAPS capsule Take 50,000 Units by mouth every Sunday. Thursdays       Patient Stressors: Health problems   Marital or family conflict    Patient Strengths: Ability for insight  Capable of independent living  Motivation for treatment/growth  Supportive family/friends   Treatment Modalities: Medication Management, Group therapy, Case management,  1 to 1 session with clinician, Psychoeducation, Recreational therapy.   Physician Treatment Plan for Primary Diagnosis: MDD (major depressive disorder), recurrent, severe, with psychosis (HCC) Long Term Goal(s): Improvement in symptoms so as ready for discharge   Short Term  Goals: Ability to identify changes in lifestyle to reduce recurrence of condition will improve Ability to verbalize feelings will improve Ability to disclose and discuss suicidal ideas Ability to demonstrate self-control will improve Ability to identify and develop effective coping behaviors will improve Ability to maintain clinical measurements within normal limits  will improve Compliance with prescribed medications will improve Ability to identify triggers associated with substance abuse/mental health issues will improve  Medication Management: Evaluate patient's response, side effects, and tolerance of medication regimen.  Therapeutic Interventions: 1 to 1 sessions, Unit Group sessions and Medication administration.  Evaluation of Outcomes: Adequate for Discharge  Physician Treatment Plan for Secondary Diagnosis: Principal Problem:   MDD (major depressive disorder), recurrent, severe, with psychosis (HCC)  Long Term Goal(s): Improvement in symptoms so as ready for discharge   Short Term Goals: Ability to identify changes in lifestyle to reduce recurrence of condition will improve Ability to verbalize feelings will improve Ability to disclose and discuss suicidal ideas Ability to demonstrate self-control will improve Ability to identify and develop effective coping behaviors will improve Ability to maintain clinical measurements within normal limits will improve Compliance with prescribed medications will improve Ability to identify triggers associated with substance abuse/mental health issues will improve     Medication Management: Evaluate patient's response, side effects, and tolerance of medication regimen.  Therapeutic Interventions: 1 to 1 sessions, Unit Group sessions and Medication administration.  Evaluation of Outcomes: Adequate for Discharge   RN Treatment Plan for Primary Diagnosis: MDD (major depressive disorder), recurrent, severe, with psychosis (HCC) Long Term  Goal(s): Knowledge of disease and therapeutic regimen to maintain health will improve  Short Term Goals: Ability to demonstrate self-control, Ability to participate in decision making will improve, Ability to verbalize feelings will improve, Ability to disclose and discuss suicidal ideas, Ability to identify and develop effective coping behaviors will improve, and Compliance with prescribed medications will improve  Medication Management: RN will administer medications as ordered by provider, will assess and evaluate patient's response and provide education to patient for prescribed medication. RN will report any adverse and/or side effects to prescribing provider.  Therapeutic Interventions: 1 on 1 counseling sessions, Psychoeducation, Medication administration, Evaluate responses to treatment, Monitor vital signs and CBGs as ordered, Perform/monitor CIWA, COWS, AIMS and Fall Risk screenings as ordered, Perform wound care treatments as ordered.  Evaluation of Outcomes: Adequate for Discharge   LCSW Treatment Plan for Primary Diagnosis: MDD (major depressive disorder), recurrent, severe, with psychosis (HCC) Long Term Goal(s): Safe transition to appropriate next level of care at discharge, Engage patient in therapeutic group addressing interpersonal concerns.  Short Term Goals: Engage patient in aftercare planning with referrals and resources, Increase social support, Increase ability to appropriately verbalize feelings, Increase emotional regulation, Facilitate acceptance of mental health diagnosis and concerns, and Increase skills for wellness and recovery  Therapeutic Interventions: Assess for all discharge needs, 1 to 1 time with Social worker, Explore available resources and support systems, Assess for adequacy in community support network, Educate family and significant other(s) on suicide prevention, Complete Psychosocial Assessment, Interpersonal group therapy.  Evaluation of Outcomes:  Adequate for Discharge   Progress in Treatment: Attending groups: Yes. Participating in groups: Yes. Taking medication as prescribed: Yes. Toleration medication: Yes. Family/Significant other contact made: Yes, individual(s) contacted:  SPE completed with the patient's sister. Patient understands diagnosis: Yes. Discussing patient identified problems/goals with staff: Yes. Medical problems stabilized or resolved: Yes. Denies suicidal/homicidal ideation: Yes. Issues/concerns per patient self-inventory: Yes. Other: none  New problem(s) identified: No, Describe:  None identified 12/01/23 Update: Pt is mildly confused but pleasant. Update 12/06/23: No changes at this time. Update: 12/16/2023: No changes at this time.  Update 12/21/2023:  No changes at this time.    New Short Term/Long Term Goal(s):elimination of symptoms of psychosis, medication management for  mood stabilization; elimination of SI thoughts; development of comprehensive mental wellness plan. 12/01/23 Update: Goal to remain the same. Update 12/06/23: No changes at this time Update 12/11/23: No changes at this time.  Update: 12/16/2023: No changes at this time.  Update: 12/16/2023: No changes at this time.   Update 12/21/2023:  No changes at this time.     Patient Goals:  "Besides getting back to my family, getting back to my life, I have a two bedroom house" 12/01/23 Update: Pt goal to remain the same. Update 12/06/23: No changes at this time Update 12/11/23: No changes at this time.  Update: 12/16/2023: No changes at this time.   Update 12/21/2023:  No changes at this time.     Discharge Plan or Barriers: CSW will assist with appropriate discharge planning  12/01/23 Update: Discharge plan to remain the same. Update 12/06/23: Pt's sister is getting guardianship of pt and has a court date on 12/28/23. Update 12/11/23: Pt was served guardianship paperwork. Pt has guardianship hearing on 12/28/23.  Update: 12/16/2023: No changes at this time.  Update  12/21/2023:  Patient is ready for discharge at this time.  Patient's sister and additional family are declining to pick the patient up for discharge.  They report they are working on placement, however, "that will take some time".     Reason for Continuation of Hospitalization: Depression Medication stabilization 12/01/23 Update: Depression Medication stabilization. Update 12/06/23: No changes at this time Update 12/11/23: No changes at this time .  Update: 12/16/2023: No changes at this time.   Update 12/21/2023:  No changes at this time.     Estimated Length of Stay:1 to 7 days 12/01/23 Update: TBD Update 12/06/23: TBD Update 12/11/23: TBD.  Update: 12/16/2023: TBD   Update 12/21/2023:  TBD    Last 3 [place] Suicide Severity Risk Score: Flowsheet Row Admission (Current) from 11/25/2023 in [facility] to Hosp-Admission (Discharged) from 11/16/2023 in [facility] ED from 08/09/2023 in [facility] Department at [facility]  C-SSRS RISK CATEGORY No Risk No Risk No Risk       Last PHQ 2/9 Scores:    08/13/2020    4:59 PM 08/04/2020    3:16 PM 12/15/2015    7:07 PM  Depression screen PHQ 2/9  Decreased Interest 1 1 0  Down, Depressed, Hopeless 2 2 0  PHQ - 2 Score 3 3 0  Altered sleeping 3 3   Tired, decreased energy 2 2   Change in appetite 2 2   Feeling bad or failure about yourself  1 1   Trouble concentrating 2 2   Moving slowly or fidgety/restless 2 2   Suicidal thoughts 0 0   PHQ-9 Score 15 15   Difficult doing work/chores Somewhat difficult Somewhat difficult     Scribe for Treatment Team: Harden Mo, LCSW 12/21/2023 10:58 AM

## 2023-12-21 NOTE — [facility] Counselor (Signed)
CSW contacted the patients sister, Keyaira Bernabe at [phone], to inform of plan to discharge.   Sister reports that she will NOT pick the patient up.  She reports that she and the family can not provide the patient with a safe place.  She reports that the patient needs 24/7 supervision.  She reports that she is working with Jeannett Senior at [facility] for placement.  She reports "that will take some time".  She reports that "DSS understands".    CSW staffed with [facility] Supervisor Verner Chol as well as pt's nurse and psychiatrist.  Penni Homans, MSW, LCSW 12/21/2023 11:16 AM

## 2023-12-21 NOTE — Plan of Care (Signed)
  Problem: Activity: Goal: Interest or engagement in activities will improve Outcome: Progressing Goal: Sleeping patterns will improve Outcome: Progressing   

## 2023-12-21 NOTE — Group Note (Signed)
Date:  12/20/2023 Time:  8:00 PM  Group Topic/Focus:  Identifying Needs:   The focus of this group is to help patients identify their personal needs that have been historically problematic and identify healthy behaviors to address their needs.    Participation Level:  Active  Participation Quality:  Appropriate  Affect:  Appropriate  Cognitive:  Appropriate  Insight: Appropriate  Engagement in Group:  Engaged  Modes of Intervention:  Education  Additional Comments:    Garry Heater 12/20/2023, 8:00 PM

## 2023-12-21 NOTE — Group Note (Signed)
Recreation Therapy Group Note   Group Topic:Emotion Expression  Group Date: 12/21/2023 Start Time: 1400 End Time: 1510 Facilitators: Rosina Lowenstein, LRT, CTRS Location:  Dayroom  Group Description: Positivity Collage. LRT and patients discussed the importance of having a positive mindset and being happy. Patients received magazines, safety scissors, a glue stick and a piece of paper. Pts were encouraged to find images or words in the magazines that showed "happiness" or positivity to them. Pt shared their collage with the group once they were finished. LRT and pts discussed how it can be difficult to always have a positive mindset, especially when they have mental health challenges.   Goal Area(s) Addressed:  Pt will identify things associate with positivity. Pt will reduce negative thinking. Pt will identify a new coping skill of thinking positive thoughts.    Affect/Mood: Appropriate   Participation Level: Active and Engaged   Participation Quality: Independent   Behavior: Appropriate, Calm, and Cooperative   Speech/Thought Process: Coherent   Insight: Fair   Judgement: Fair    Modes of Intervention: Art, Education, Exploration, and Guided Discussion   Patient Response to Interventions:  Attentive, Engaged, and Interested    Education Outcome:  Acknowledges education   Clinical Observations/Individualized Feedback: Rohana was active in their participation of session activities and group discussion. Pt identified "things that are silly looking and comical are positive to me". Pt cut out appropriate images for her collage. Pt interacted well with LRT and peers duration of session.    Plan: Continue to engage patient in RT group sessions 2-3x/week.   Rosina Lowenstein, LRT, CTRS 12/21/2023 4:44 PM

## 2023-12-22 DIAGNOSIS — F333 Major depressive disorder, recurrent, severe with psychotic symptoms: Secondary | ICD-10-CM | POA: Diagnosis not present

## 2023-12-22 LAB — GLUCOSE, CAPILLARY
Glucose-Capillary: 141 mg/dL — ABNORMAL HIGH (ref 70–99)
Glucose-Capillary: 120 mg/dL — ABNORMAL HIGH (ref 70–99)
Glucose-Capillary: 100 mg/dL — ABNORMAL HIGH (ref 70–99)
Glucose-Capillary: 100 mg/dL — ABNORMAL HIGH (ref 70–99)

## 2023-12-22 MED ORDER — GUAIFENESIN 100 MG/5ML PO LIQD: 5.0000 mL | Freq: Once | ORAL | Status: AC | PRN

## 2023-12-22 NOTE — Progress Notes (Signed)
   12/22/23 0647  15 Minute Checks  Location Bedroom  Visual Appearance Calm  Behavior Sleeping  Sleep (Behavioral Health Patients Only)  Calculate sleep? (Click Yes once per 24 hr at 0600 safety check) Yes  Documented sleep last 24 hours 9.5

## 2023-12-22 NOTE — Group Note (Signed)
Date:  12/22/2023 Time:  10:32 AM  Group Topic/Focus:  Gratitude Group: The purpose of this group is to have patients work together on worksheets that ask what they are grateful for in their lives.      Participation Level:  Active  Participation Quality:  Appropriate  Affect:  Appropriate  Cognitive:  Alert and Appropriate  Insight: Appropriate  Engagement in Group:  Engaged  Modes of Intervention:  Discussion  Additional Comments:    Marta Antu 12/22/2023, 10:32 AM
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
## 2023-12-22 NOTE — Progress Notes (Signed)
 Patient is pleasant and cooperative.  Animated affect.  Denies SI/HI and AVH.  Denies anxiety and depression. Denies pain.    Compliant with scheduled medications.  BP medications held.  Dr. Shela Commons made aware. 15 min checks in place for safety.  Patient is present in the milieu.  Appropriate interaction with peers.  Observed walkoing in the hallway for exercise.

## 2023-12-22 NOTE — Group Note (Signed)
Date:  12/22/2023 Time:  8:41 PM  Group Topic/Focus:  Overcoming Stress:   The focus of this group is to define stress and help patients assess their triggers.    Participation Level:  Active  Participation Quality:  Appropriate  Affect:  Appropriate  Cognitive:  Appropriate  Insight: Appropriate  Engagement in Group:  Engaged  Modes of Intervention:  Education  Additional Comments:    Garry Heater 12/22/2023, 8:41 PM

## 2023-12-22 NOTE — Plan of Care (Signed)
  Problem: Education: Goal: Utilization of techniques to improve thought processes will improve Outcome: Progressing Goal: Knowledge of the prescribed therapeutic regimen will improve Outcome: Progressing   Problem: Education: Goal: Ability to state activities that reduce stress will improve Outcome: Progressing

## 2023-12-22 NOTE — Plan of Care (Signed)
  Problem: Education: Goal: Knowledge of the prescribed therapeutic regimen will improve Outcome: Progressing   Problem: Education: Goal: Ability to state activities that reduce stress will improve Outcome: Progressing

## 2023-12-23 DIAGNOSIS — F333 Major depressive disorder, recurrent, severe with psychotic symptoms: Secondary | ICD-10-CM | POA: Diagnosis not present

## 2023-12-23 LAB — GLUCOSE, CAPILLARY
Glucose-Capillary: 159 mg/dL — ABNORMAL HIGH (ref 70–99)
Glucose-Capillary: 92 mg/dL (ref 70–99)
Glucose-Capillary: 125 mg/dL — ABNORMAL HIGH (ref 70–99)
Glucose-Capillary: 89 mg/dL (ref 70–99)

## 2023-12-23 MED ORDER — MENTHOL 3 MG MT LOZG: 1.0000 | LOZENGE | OROMUCOSAL | Status: DC | PRN

## 2023-12-23 MED ORDER — TRAZODONE HCL 50 MG PO TABS
50.0000 mg | ORAL_TABLET | Freq: Every evening | ORAL | Status: DC | PRN
  Administered 2023-12-24 – 2024-01-09 (×10): 50 mg via ORAL
  Filled 2023-12-23 (×11): qty 1

## 2023-12-23 NOTE — Progress Notes (Signed)
   12/22/23 2000  Psych Admission Type (Psych Patients Only)  Admission Status Involuntary  Psychosocial Assessment  Patient Complaints None  Eye Contact Fair  Facial Expression Animated  Affect Appropriate to circumstance  Speech Logical/coherent  Interaction Assertive  Motor Activity Slow  Appearance/Hygiene Unremarkable  Behavior Characteristics Cooperative  Mood Pleasant  Thought Process  Coherency WDL  Content WDL  Delusions None reported or observed  Perception WDL  Hallucination None reported or observed  Judgment Impaired  Confusion Mild  Danger to Self  Current suicidal ideation? Denies   Pt is pleasantly confused. C/o sore throat and difficulty sleeping. N.O. Cepacol lozenges and Trazodone 50 mg po ordered PRN. Visible in dayroom. Interacting with peers and staff. Attends group. Po med compliant. No s/s hyper/hypoglycemia noted. Denies SI/HIAV/H. Q 15 min checks maintained for safety. No behavior issues noted. Plan of care continued.

## 2023-12-23 NOTE — Group Note (Signed)
Date:  12/23/2023 Time:  5:23 PM  Group Topic/Focus:  Self Care:   The focus of this group is to help patients understand the importance of self-care in order to improve or restore emotional, physical, spiritual, interpersonal, and financial health.    Participation Level:  Active  Participation Quality:  Appropriate, Attentive, Sharing, and Supportive  Affect:  Appropriate  Cognitive:  Alert, Appropriate, and Oriented  Insight: Appropriate, Good, and Improving  Engagement in Group:  Engaged, Improving, and Supportive  Modes of Intervention:  Activity  Additional Comments:     Alexis Frock 12/23/2023, 5:23 PM

## 2023-12-23 NOTE — Plan of Care (Signed)
  Problem: Education: Goal: Mental status will improve Outcome: Progressing   Problem: Activity: Goal: Interest or engagement in activities will improve Outcome: Progressing Goal: Sleeping patterns will improve Outcome: Progressing   

## 2023-12-23 NOTE — Progress Notes (Signed)
 Patient is pleasant with animated affect.  Denies SI/HI and AVH.  Denies feelings of anxiety and depression.  Denies pain.  Reports she slept better last night.   Compliant with scheduled medications. BP medications held.  Dr. Shela Commons made aware. 15 min checks in place for safety.  Patient is present in the milieu.  Appropriate interaction with peers and staff.  Walks the unit often for exercise.

## 2023-12-23 NOTE — Group Note (Signed)
Date:  12/23/2023 Time:  3:18 PM  Group Topic/Focus:  Movement therapy    Participation Level:  Did Not Attend    Rodena Goldmann 12/23/2023, 3:18 PM
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
## 2023-12-23 NOTE — Plan of Care (Signed)
  Problem: Education: Goal: Utilization of techniques to improve thought processes will improve Outcome: Progressing   Problem: Education: Goal: Ability to state activities that reduce stress will improve Outcome: Progressing   Problem: Activity: Goal: Interest or engagement in activities will improve Outcome: Progressing Goal: Sleeping patterns will improve Outcome: Progressing   Problem: Coping: Goal: Ability to verbalize frustrations and anger appropriately will improve Outcome: Progressing Goal: Ability to demonstrate self-control will improve Outcome: Progressing   Problem: Safety: Goal: Periods of time without injury will increase Outcome: Progressing

## 2023-12-23 NOTE — Group Note (Signed)
LCSW Group Therapy Note  Group Date: 12/23/2023 Start Time: 1400 End Time: 1445   Type of Therapy and Topic:  Group Therapy - Healthy vs Unhealthy Coping Skills  Participation Level:  Active   Description of Group The focus of this group was to determine what unhealthy coping techniques typically are used by group members and what healthy coping techniques would be helpful in coping with various problems. Patients were guided in becoming aware of the differences between healthy and unhealthy coping techniques. Patients were asked to identify 2-3 healthy coping skills they would like to learn to use more effectively.  Therapeutic Goals Patients learned that coping is what human beings do all day long to deal with various situations in their lives Patients defined and discussed healthy vs unhealthy coping techniques Patients identified their preferred coping techniques and identified whether these were healthy or unhealthy Patients determined 2-3 healthy coping skills they would like to become more familiar with and use more often. Patients provided support and ideas to each other   Summary of Patient Progress:  During group, Natasha Chandler expressed that she plans on finding ways to manage her appointments and medication by hopefully getting connected with home health. Patient proved open to input from peers and feedback from CSW. Patient demonstrated positive insight into the subject matter, was respectful of peers, and participated throughout the entire session.   Therapeutic Modalities Cognitive Behavioral Therapy Motivational Interviewing  Natasha Chandler 12/23/2023  3:14 PM

## 2023-12-23 NOTE — Group Note (Signed)
Date:  12/23/2023 Time:  9:02 PM  Group Topic/Focus:  Wrap-Up Group:   The focus of this group is to help patients review their daily goal of treatment and discuss progress on daily workbooks.    Participation Level:  Active  Participation Quality:  Appropriate  Affect:  Appropriate  Cognitive:  Appropriate  Insight: Appropriate  Engagement in Group:  Engaged  Modes of Intervention:  Discussion    Natasha Chandler 12/23/2023, 9:02 PM

## 2023-12-23 NOTE — Progress Notes (Signed)
   12/23/23 0615  15 Minute Checks  Location Bedroom  Visual Appearance Calm  Behavior Sleeping  Sleep (Behavioral Health Patients Only)  Calculate sleep? (Click Yes once per 24 hr at 0600 safety check) Yes  Documented sleep last 24 hours 9.25

## 2023-12-24 DIAGNOSIS — F333 Major depressive disorder, recurrent, severe with psychotic symptoms: Secondary | ICD-10-CM | POA: Diagnosis not present

## 2023-12-24 LAB — GLUCOSE, CAPILLARY
Glucose-Capillary: 141 mg/dL — ABNORMAL HIGH (ref 70–99)
Glucose-Capillary: 169 mg/dL — ABNORMAL HIGH (ref 70–99)
Glucose-Capillary: 113 mg/dL — ABNORMAL HIGH (ref 70–99)
Glucose-Capillary: 106 mg/dL — ABNORMAL HIGH (ref 70–99)

## 2023-12-24 MED ORDER — DONEPEZIL HCL 5 MG PO TABS
10.0000 mg | ORAL_TABLET | Freq: Every day | ORAL | Status: DC
  Administered 2023-12-24 – 2024-01-02 (×10): 10 mg via ORAL
  Filled 2023-12-24 (×9): qty 2

## 2023-12-24 MED ORDER — RISPERIDONE 1 MG PO TABS
0.5000 mg | ORAL_TABLET | Freq: Every day | ORAL | Status: DC
  Administered 2023-12-24 – 2023-12-30 (×7): 0.5 mg via ORAL
  Filled 2023-12-24 (×7): qty 1
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
## 2023-12-24 NOTE — Plan of Care (Signed)
  Problem: Education: Goal: Ability to state activities that reduce stress will improve Outcome: Not Progressing   Problem: Education: Goal: Emotional status will improve Outcome: Not Progressing

## 2023-12-24 NOTE — Progress Notes (Signed)
   12/23/23 2300  Psych Admission Type (Psych Patients Only)  Admission Status Involuntary  Psychosocial Assessment  Patient Complaints None  Eye Contact Fair  Facial Expression Animated  Affect Appropriate to circumstance  Speech Logical/coherent  Interaction Assertive  Motor Activity Slow  Appearance/Hygiene Unremarkable  Behavior Characteristics Cooperative;Appropriate to situation  Mood Pleasant  Thought Process  Coherency WDL  Content WDL  Delusions None reported or observed  Perception WDL  Hallucination None reported or observed  Judgment Impaired  Confusion Mild  Danger to Self  Current suicidal ideation? Denies  Agreement Not to Harm Self Yes  Description of Agreement Verbal  Danger to Others  Danger to Others None reported or observed

## 2023-12-24 NOTE — Progress Notes (Signed)
   12/24/23 1000  Psych Admission Type (Psych Patients Only)  Admission Status Involuntary  Psychosocial Assessment  Patient Complaints None  Eye Contact Fair  Facial Expression Animated  Affect Appropriate to circumstance  Speech Logical/coherent  Interaction Assertive  Motor Activity Slow  Appearance/Hygiene Unremarkable  Behavior Characteristics Cooperative;Appropriate to situation  Mood Pleasant  Thought Process  Coherency WDL  Content WDL  Delusions None reported or observed  Perception WDL  Hallucination None reported or observed  Judgment Impaired  Confusion Mild  Danger to Self  Current suicidal ideation? Denies  Agreement Not to Harm Self Yes  Description of Agreement Verbal  Danger to Others  Danger to Others None reported or observed

## 2023-12-24 NOTE — Progress Notes (Signed)
While meeting with the attorney and social worker, patient became upset and started crying. At this point patient was inconsolable. Patient medicated with Zyprexa at 1128. Upon follow up, patient was met with relief.

## 2023-12-24 NOTE — Group Note (Signed)
Recreation Therapy Group Note   Group Topic:Health and Wellness  Group Date: 12/24/2023 Start Time: 1100 End Time: 1145 Facilitators: Rosina Lowenstein, LRT, CTRS Location:  Dayroom  Activity Description/Intervention: Therapeutic Drumming. Patients with peers and staff were given the opportunity to engage in a leader facilitated [facility] Group Empowerment Drumming Circle with staff from the [employer], in partnership with [employer]. Teaching laboratory technician and trained [employer], Theodoro Doing leading with LRT observing and documenting intervention and pt response. This evidenced-based practice targets 7 areas of health and wellbeing in the human experience including: stress-reduction, exercise, self-expression, camaraderie/support, nurturing, spirituality, and music-making (leisure).   Goal Area(s) Addresses:  Patient will engage in pro-social way in music group.  Patient will follow directions of drum leader on the first prompt. Patient will demonstrate no behavioral issues during group.  Patient will identify if a reduction in stress level occurs as a result of participation in therapeutic drum circle.    Education: Leisure exposure, Pharmacologist, Musical expression, Discharge Planning    Affect/Mood: N/A   Participation Level: Did not attend     Plan: Continue to engage patient in RT group sessions 2-3x/week.   Rosina Lowenstein, LRT, CTRS 12/24/2023 1:40 PM

## 2023-12-24 NOTE — BHH Counselor (Signed)
Csw met with Cira Rue, guardian ad litem and pt.   Guardian Ad Litem requests progress notes and medications sent to his email.   CSW will send.   Reynaldo Minium, MSW, [place] 12/24/2023 3:03 PM

## 2023-12-24 NOTE — Progress Notes (Signed)
   12/24/23 0600  15 Minute Checks  Location Bedroom  Visual Appearance Calm  Behavior Sleeping  Sleep (Behavioral Health Patients Only)  Calculate sleep? (Click Yes once per 24 hr at 0600 safety check) Yes  Documented sleep last 24 hours 9

## 2023-12-24 NOTE — Progress Notes (Signed)
   12/24/23 2300  Psych Admission Type (Psych Patients Only)  Admission Status Involuntary  Psychosocial Assessment  Patient Complaints Depression  Eye Contact Fair  Facial Expression Flat  Affect Depressed  Speech Logical/coherent  Interaction Assertive  Motor Activity Slow  Appearance/Hygiene Unremarkable  Behavior Characteristics Cooperative;Appropriate to situation  Mood Depressed;Pleasant  Thought Process  Coherency WDL  Content WDL  Delusions None reported or observed  Perception WDL  Hallucination None reported or observed  Judgment Impaired  Confusion Mild  Danger to Self  Current suicidal ideation? Denies  Agreement Not to Harm Self Yes  Description of Agreement Verbal  Danger to Others  Danger to Others None reported or observed
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
## 2023-12-25 DIAGNOSIS — F333 Major depressive disorder, recurrent, severe with psychotic symptoms: Secondary | ICD-10-CM | POA: Diagnosis not present

## 2023-12-25 LAB — GLUCOSE, CAPILLARY
Glucose-Capillary: 194 mg/dL — ABNORMAL HIGH (ref 70–99)
Glucose-Capillary: 172 mg/dL — ABNORMAL HIGH (ref 70–99)
Glucose-Capillary: 126 mg/dL — ABNORMAL HIGH (ref 70–99)
Glucose-Capillary: 126 mg/dL — ABNORMAL HIGH (ref 70–99)
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
## 2023-12-25 NOTE — Group Note (Signed)
Date:  12/25/2023 Time:  10:33 PM  Group Topic/Focus:  Wrap-Up Group:   The focus of this group is to help patients review their daily goal of treatment and discuss progress on daily workbooks.    Participation Level:  Active  Participation Quality:  Appropriate  Affect:  Appropriate  Cognitive:  Appropriate  Insight: Appropriate  Engagement in Group:  Engaged  Modes of Intervention:  Discussion  Additional Comments:    [name] 12/25/2023, 10:33 PM

## 2023-12-25 NOTE — Group Note (Signed)
Recreation Therapy Group Note   Group Topic:Other  Group Date: 12/25/2023 Start Time: 1500 End Time: 1600 Facilitators: Rosina Lowenstein, LRT, CTRS Location:  Dayroom  Group Description: Positive Affirmation Bingo. LRT and patients played multiple games of Positive Affirmation Bingo with music playing in the background. LRT and pts discussed what a positive affirmation is, the importance of speaking kindly to yourself, and the use of this as a coping skill.   Goal Area(s) Addressed: Patient will learn positive affirmations.  Patient will engage in recreation activity.  Patient will increase communication.  Patient will identify a new coping skill.    Affect/Mood: Appropriate   Participation Level: Moderate   Participation Quality: Minimal Cues   Behavior: Preoccupied   Speech/Thought Process: Loose association   Insight: Limited   Judgement: Limited   Modes of Intervention: Competitive Play, Education, Exploration, and Guided Discussion   Patient Response to Interventions:  Receptive   Education Outcome:  In group clarification offered    Clinical Observations/Individualized Feedback: Genita was somewhat active in their participation of session activities and group discussion. Pt mentioned her sister, her house, and lawyers multiple times while in group. Pt was pleasant and receptive to redirection.    Plan: Continue to engage patient in RT group sessions 2-3x/week.   Rosina Lowenstein, LRT, CTRS 12/25/2023 4:55 PM

## 2023-12-25 NOTE — BHH Counselor (Signed)
CSW sent requested records to pt's GAL Hulen Shouts at [email or web address]   Reynaldo Minium, MSW, [facility] 12/25/2023 10:21 AM

## 2023-12-25 NOTE — Group Note (Signed)
Recreation Therapy Group Note   Group Topic:Self-Esteem  Group Date: 12/25/2023 Start Time: 1100 End Time: 1140 Facilitators: Rosina Lowenstein, LRT, CTRS Location:  Dayroom  Group Description: Positive Focus. Patients are given a handout that has 9 different boxes on it. Each box has a different prompt on it that requires you to identify and add something positive about themselves. LRT encourages pts to share at least two of their boxes to the group. LRT and pts discuss the importance of "thinking positive", self-esteem and how they can apply it to their everyday life post-discharge.   Goal Area(s) Addressed: Patient will identify positive qualities about themselves. Patient will identify definition of "self-esteem". Patients will identify the difference between positive and negative self-esteem.  Patient will recite positive qualities and affirmations aloud to the group.   Affect/Mood: Appropriate   Participation Level: Active and Engaged   Participation Quality: Independent   Behavior: Appropriate, Calm, and Cooperative   Speech/Thought Process: Coherent   Insight: Good   Judgement: Good   Modes of Intervention: Education, Guided Discussion, and Worksheet   Patient Response to Interventions:  Attentive, Engaged, and Receptive   Education Outcome:  Acknowledges education   Clinical Observations/Individualized Feedback: Pita was active in their participation of session activities and group discussion. Pt identified "I passed a test when I was at [facility] that I didn't think I was going to. Something I do well is arts and crafts." Pt spontaneously contributed to group discussion while interacting well with LRT and peers duration of session.    Plan: Continue to engage patient in RT group sessions 2-3x/week.   [address] LRT, CTRS 12/25/2023 1:29 PM
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
## 2023-12-25 NOTE — Progress Notes (Signed)
   12/25/23 2300  Psych Admission Type (Psych Patients Only)  Admission Status Involuntary  Psychosocial Assessment  Patient Complaints None  Eye Contact Fair  Facial Expression Animated  Affect Appropriate to circumstance  Speech Logical/coherent  Interaction Assertive  Motor Activity Slow  Appearance/Hygiene Unremarkable  Behavior Characteristics Cooperative  Mood Pleasant  Thought Process  Coherency WDL  Content WDL  Delusions None reported or observed  Perception WDL  Hallucination None reported or observed  Judgment Limited  Confusion Mild  Danger to Self  Current suicidal ideation? Denies  Agreement Not to Harm Self Yes  Description of Agreement Verbal  Danger to Others  Danger to Others None reported or observed
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
## 2023-12-25 NOTE — Progress Notes (Signed)
   12/25/23 1200  Psych Admission Type (Psych Patients Only)  Admission Status Involuntary  Psychosocial Assessment  Patient Complaints Depression  Eye Contact Fair  Facial Expression Worried  Affect Depressed  Speech Logical/coherent  Interaction Assertive  Motor Activity Slow  Appearance/Hygiene Unremarkable  Behavior Characteristics Cooperative  Mood Pleasant  Thought Process  Coherency WDL  Content WDL  Delusions None reported or observed  Perception WDL  Hallucination None reported or observed  Judgment Impaired  Confusion Mild  Danger to Self  Current suicidal ideation? Denies  Agreement Not to Harm Self Yes  Description of Agreement verbal  Danger to Others  Danger to Others None reported or observed

## 2023-12-26 DIAGNOSIS — F333 Major depressive disorder, recurrent, severe with psychotic symptoms: Secondary | ICD-10-CM | POA: Diagnosis not present

## 2023-12-26 LAB — GLUCOSE, CAPILLARY
Glucose-Capillary: 203 mg/dL — ABNORMAL HIGH (ref 70–99)
Glucose-Capillary: 139 mg/dL — ABNORMAL HIGH (ref 70–99)
Glucose-Capillary: 179 mg/dL — ABNORMAL HIGH (ref 70–99)
Glucose-Capillary: 102 mg/dL — ABNORMAL HIGH (ref 70–99)
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
## 2023-12-26 NOTE — Progress Notes (Signed)
   12/26/23 1100  Psych Admission Type (Psych Patients Only)  Admission Status Involuntary  Psychosocial Assessment  Patient Complaints None  Eye Contact Fair  Facial Expression Animated  Affect Appropriate to circumstance  Speech Logical/coherent  Interaction Assertive  Motor Activity Slow  Appearance/Hygiene Unremarkable  Behavior Characteristics Cooperative  Mood Pleasant  Thought Process  Coherency WDL  Content WDL  Delusions None reported or observed  Perception WDL  Hallucination None reported or observed  Judgment Limited  Confusion Mild  Danger to Self  Current suicidal ideation? Denies  Agreement Not to Harm Self Yes  Description of Agreement verbal  Danger to Others  Danger to Others None reported or observed

## 2023-12-26 NOTE — Group Note (Signed)
Date:  12/26/2023 Time:  9:06 PM  Group Topic/Focus:  Coping With Mental Health Crisis:   The purpose of this group is to help patients identify strategies for coping with mental health crisis.  Group discusses possible causes of crisis and ways to manage them effectively.    Participation Level:  Active  Participation Quality:  Appropriate  Affect:  Appropriate  Cognitive:  Appropriate  Insight: Appropriate  Engagement in Group:  Engaged  Modes of Intervention:  Education  Additional Comments:    Garry Heater 12/26/2023, 9:06 PM

## 2023-12-26 NOTE — BH IP Treatment Plan (Signed)
Interdisciplinary Treatment and Diagnostic Plan Update  12/26/2023 Time of Session: 10:00 AM  Natasha Chandler MRN: [id number]  Principal Diagnosis: MDD (major depressive disorder), recurrent, severe, with psychosis (HCC)  Secondary Diagnoses: Principal Problem:   MDD (major depressive disorder), recurrent, severe, with psychosis (HCC)   Current Medications:  Current Facility-Administered Medications  Medication Dose Route Frequency Provider Last Rate Last Admin   acetaminophen (TYLENOL) tablet 650 mg  650 mg Oral Q6H PRN Lewanda Rife, MD   650 mg at 12/14/23 0649   albuterol (PROVENTIL) (2.5 MG/3ML) 0.083% nebulizer solution 3 mL  3 mL Inhalation Q6H PRN Lamar Sprinkles, MD       alum & mag hydroxide-simeth (MAALOX/MYLANTA) 200-200-20 MG/5ML suspension 30 mL  30 mL Oral Q4H PRN Lamar Sprinkles, MD       amLODipine (NORVASC) tablet 10 mg  10 mg Oral Daily Lamar Sprinkles, MD   10 mg at 12/26/23 1036   aspirin EC tablet 81 mg  81 mg Oral Daily Sarina Ill, DO   81 mg at 12/26/23 1036   donepezil (ARICEPT) tablet 10 mg  10 mg Oral QHS Massengill, Harrold Donath, MD   10 mg at 12/25/23 2100   fluticasone (FLONASE) 50 MCG/ACT nasal spray 2 spray  2 spray Each Nare Daily PRN Lamar Sprinkles, MD       hydrochlorothiazide (HYDRODIURIL) tablet 12.5 mg  12.5 mg Oral Daily Sarina Ill, DO   12.5 mg at 12/25/23 3329   insulin aspart (novoLOG) injection 0-5 Units  0-5 Units Subcutaneous QHS Sarina Ill, DO   2 Units at 12/13/23 2133   insulin aspart (novoLOG) injection 0-9 Units  0-9 Units Subcutaneous TID WC Sarina Ill, DO   3 Units at 12/26/23 0836   insulin aspart (novoLOG) injection 8 Units  8 Units Subcutaneous TID WC Sarina Ill, DO   8 Units at 12/26/23 0836   levothyroxine (SYNTHROID) tablet 50 mcg  50 mcg Oral Q0600 Sarina Ill, DO   50 mcg at 12/26/23 5188   lip balm (BLISTEX) ointment 1 Application  1 Application Topical PRN  Rockwell Alexandria, RPH       loperamide (IMODIUM) capsule 2 mg  2 mg Oral PRN Lewanda Rife, MD   2 mg at 12/13/23 1254   loratadine (CLARITIN) tablet 10 mg  10 mg Oral Daily PRN Lamar Sprinkles, MD       losartan (COZAAR) tablet 100 mg  100 mg Oral Daily Lamar Sprinkles, MD   100 mg at 12/26/23 1035   magnesium hydroxide (MILK OF MAGNESIA) suspension 30 mL  30 mL Oral Daily PRN Lamar Sprinkles, MD       menthol-cetylpyridinium (CEPACOL) lozenge 3 mg  1 lozenge Oral PRN Dixon, Elray Buba, NP       mometasone-formoterol (DULERA) 200-5 MCG/ACT inhaler 2 puff  2 puff Inhalation BID Lamar Sprinkles, MD   2 puff at 12/26/23 1035   OLANZapine zydis (ZYPREXA) disintegrating tablet 5 mg  5 mg Oral TID PRN Lamar Sprinkles, MD   5 mg at 12/24/23 1128   ondansetron (ZOFRAN) tablet 4 mg  4 mg Oral Q6H PRN Lamar Sprinkles, MD       Or   ondansetron (ZOFRAN) injection 4 mg  4 mg Intravenous Q6H PRN Lamar Sprinkles, MD       risperiDONE (RISPERDAL) tablet 0.5 mg  0.5 mg Oral QHS Massengill, Nathan, MD   0.5 mg at 12/25/23 2101   rosuvastatin (CRESTOR) tablet 20 mg  20 mg  Oral Daily Lamar Sprinkles, MD   20 mg at 12/26/23 1035   senna-docusate (Senokot-S) tablet 1 tablet  1 tablet Oral QHS PRN Lamar Sprinkles, MD       sertraline (ZOLOFT) tablet 100 mg  100 mg Oral Daily Lamar Sprinkles, MD   100 mg at 12/26/23 1035   traZODone (DESYREL) tablet 50 mg  50 mg Oral QHS PRN Jearld Lesch, NP   50 mg at 12/24/23 2117   PTA Medications: Medications Prior to Admission  Medication Sig Dispense Refill Last Dose/Taking   albuterol (PROVENTIL HFA;VENTOLIN HFA) 108 (90 Base) MCG/ACT inhaler Inhale 1-2 puffs into the lungs every 6 (six) hours as needed for wheezing or shortness of breath.       amLODipine (NORVASC) 10 MG tablet Take 10 mg by mouth in the morning.      aspirin 81 MG chewable tablet Chew 1 tablet (81 mg total) by mouth daily.      diclofenac (VOLTAREN) 50 MG EC tablet Take 50 mg by mouth 2 (two) times  daily.      donepezil (ARICEPT) 5 MG tablet Take 5 mg by mouth at bedtime.      fluticasone (FLONASE) 50 MCG/ACT nasal spray Place 2 sprays into both nostrils daily as needed for allergies.      hydrochlorothiazide (HYDRODIURIL) 12.5 MG tablet Take 12.5 mg by mouth daily.      Insulin Aspart FlexPen (NOVOLOG) 100 UNIT/ML Inject 3 Units into the skin 3 (three) times daily with meals.      insulin degludec (TRESIBA) 100 UNIT/ML FlexTouch Pen Inject 10 Units into the skin daily.      Insulin Pen Needle 31G X 5 MM MISC Use to inject insulin once daily 30 each 5    levothyroxine (SYNTHROID) 50 MCG tablet Take 50 mcg by mouth daily before breakfast.      lip balm (CARMEX) ointment Apply topically as needed.      loratadine (CLARITIN) 10 MG tablet Take 1 tablet (10 mg total) by mouth daily as needed for allergies.      losartan (COZAAR) 100 MG tablet Take 1 tablet (100 mg total) by mouth daily. (Patient taking differently: Take 100 mg by mouth at bedtime.)      pioglitazone (ACTOS) 45 MG tablet Take 45 mg by mouth daily.      risperiDONE (RISPERDAL M-TABS) 0.5 MG disintegrating tablet Take 1 tablet (0.5 mg total) by mouth at bedtime.      rosuvastatin (CRESTOR) 20 MG tablet Take 20 mg by mouth at bedtime.      Semaglutide, 2 MG/DOSE, (OZEMPIC, 2 MG/DOSE,) 8 MG/3ML SOPN Inject 0.75 mLs (2 mg total) into the skin every 7 days. 9 mL 1    sertraline (ZOLOFT) 100 MG tablet Take 1 tablet (100 mg total) by mouth daily.      Vitamin D, Ergocalciferol, (DRISDOL) 1.25 MG (50000 UNIT) CAPS capsule Take 50,000 Units by mouth every Sunday. Thursdays       Patient Stressors: Health problems   Marital or family conflict    Patient Strengths: Ability for insight  Capable of independent living  Motivation for treatment/growth  Supportive family/friends   Treatment Modalities: Medication Management, Group therapy, Case management,  1 to 1 session with clinician, Psychoeducation, Recreational  therapy.   Physician Treatment Plan for Primary Diagnosis: MDD (major depressive disorder), recurrent, severe, with psychosis (HCC) Long Term Goal(s): Improvement in symptoms so as ready for discharge   Short Term Goals: Ability to identify changes in  lifestyle to reduce recurrence of condition will improve Ability to verbalize feelings will improve Ability to disclose and discuss suicidal ideas Ability to demonstrate self-control will improve Ability to identify and develop effective coping behaviors will improve Ability to maintain clinical measurements within normal limits will improve Compliance with prescribed medications will improve Ability to identify triggers associated with substance abuse/mental health issues will improve  Medication Management: Evaluate patient's response, side effects, and tolerance of medication regimen.  Therapeutic Interventions: 1 to 1 sessions, Unit Group sessions and Medication administration.  Evaluation of Outcomes: Progressing  Physician Treatment Plan for Secondary Diagnosis: Principal Problem:   MDD (major depressive disorder), recurrent, severe, with psychosis (HCC)  Long Term Goal(s): Improvement in symptoms so as ready for discharge   Short Term Goals: Ability to identify changes in lifestyle to reduce recurrence of condition will improve Ability to verbalize feelings will improve Ability to disclose and discuss suicidal ideas Ability to demonstrate self-control will improve Ability to identify and develop effective coping behaviors will improve Ability to maintain clinical measurements within normal limits will improve Compliance with prescribed medications will improve Ability to identify triggers associated with substance abuse/mental health issues will improve     Medication Management: Evaluate patient's response, side effects, and tolerance of medication regimen.  Therapeutic Interventions: 1 to 1 sessions, Unit Group sessions and  Medication administration.  Evaluation of Outcomes: Progressing   RN Treatment Plan for Primary Diagnosis: MDD (major depressive disorder), recurrent, severe, with psychosis (HCC) Long Term Goal(s): Knowledge of disease and therapeutic regimen to maintain health will improve  Short Term Goals: Ability to remain free from injury will improve, Ability to verbalize frustration and anger appropriately will improve, Ability to demonstrate self-control, Ability to participate in decision making will improve, Ability to verbalize feelings will improve, Ability to disclose and discuss suicidal ideas, Ability to identify and develop effective coping behaviors will improve, and Compliance with prescribed medications will improve  Medication Management: RN will administer medications as ordered by provider, will assess and evaluate patient's response and provide education to patient for prescribed medication. RN will report any adverse and/or side effects to prescribing provider.  Therapeutic Interventions: 1 on 1 counseling sessions, Psychoeducation, Medication administration, Evaluate responses to treatment, Monitor vital signs and CBGs as ordered, Perform/monitor CIWA, COWS, AIMS and Fall Risk screenings as ordered, Perform wound care treatments as ordered.  Evaluation of Outcomes: Progressing   LCSW Treatment Plan for Primary Diagnosis: MDD (major depressive disorder), recurrent, severe, with psychosis (HCC) Long Term Goal(s): Safe transition to appropriate next level of care at discharge, Engage patient in therapeutic group addressing interpersonal concerns.  Short Term Goals: Engage patient in aftercare planning with referrals and resources, Increase social support, Increase ability to appropriately verbalize feelings, Increase emotional regulation, Facilitate acceptance of mental health diagnosis and concerns, Facilitate patient progression through stages of change regarding substance use diagnoses  and concerns, Identify triggers associated with mental health/substance abuse issues, and Increase skills for wellness and recovery  Therapeutic Interventions: Assess for all discharge needs, 1 to 1 time with Social worker, Explore available resources and support systems, Assess for adequacy in community support network, Educate family and significant other(s) on suicide prevention, Complete Psychosocial Assessment, Interpersonal group therapy.  Evaluation of Outcomes: Progressing   Progress in Treatment: Attending groups: Yes. Participating in groups: Yes. Taking medication as prescribed: Yes. Toleration medication: Yes. Family/Significant other contact made: Yes, individual(s) contacted:  SPE completed with the patient's sister. Patient understands diagnosis: Yes. Discussing patient identified problems/goals  with staff: Yes. Medical problems stabilized or resolved: Yes. Denies suicidal/homicidal ideation: Yes. Issues/concerns per patient self-inventory: Yes. Other: none   New problem(s) identified: No, Describe:  None identified 12/01/23 Update: Pt is mildly confused but pleasant. Update 12/06/23: No changes at this time. Update: 12/16/2023: No changes at this time.  Update 12/21/2023:  No changes at this time. Update 12/26/23: No changes at this time    New Short Term/Long Term Goal(s):elimination of symptoms of psychosis, medication management for mood stabilization; elimination of SI thoughts; development of comprehensive mental wellness plan. 12/01/23 Update: Goal to remain the same. Update 12/06/23: No changes at this time Update 12/11/23: No changes at this time.  Update: 12/16/2023: No changes at this time.  Update: 12/16/2023: No changes at this time.   Update 12/21/2023:  No changes at this time. Update 12/26/23: No changes at this time     Patient Goals:  "Besides getting back to my family, getting back to my life, I have a two bedroom house" 12/01/23 Update: Pt goal to remain the same.  Update 12/06/23: No changes at this time Update 12/11/23: No changes at this time.  Update: 12/16/2023: No changes at this time.   Update 12/21/2023:  No changes at this time. Update 12/26/23: No changes at this time     Discharge Plan or Barriers: CSW will assist with appropriate discharge planning  12/01/23 Update: Discharge plan to remain the same. Update 12/06/23: Pt's sister is getting guardianship of pt and has a court date on 12/28/23. Update 12/11/23: Pt was served guardianship paperwork. Pt has guardianship hearing on 12/28/23.  Update: 12/16/2023: No changes at this time.  Update 12/21/2023:  Patient is ready for discharge at this time.  Patient's sister and additional family are declining to pick the patient up for discharge.  They report they are working on placement, however, "that will take some time".  Update 12/26/23: Patient continues to be ready for discharge. Pt has guardianship hearing in 12/28/23. Pt will be discharged following sister taking guardianship    Reason for Continuation of Hospitalization: Depression Medication stabilization 12/01/23 Update: Depression Medication stabilization. Update 12/06/23: No changes at this time Update 12/11/23: No changes at this time .  Update: 12/16/2023: No changes at this time.   Update 12/21/2023:  No changes at this time. Update 12/26/23: No changes at this time     Estimated Length of Stay:1 to 7 days 12/01/23 Update: TBD Update 12/06/23: TBD Update 12/11/23: TBD.  Update: 12/16/2023: TBD   Update 12/21/2023:  TBD Update 12/26/23: 12/28/23  Last 3 [place] Suicide Severity Risk Score: Flowsheet Row Admission (Current) from 11/25/2023 in [facility] to Hosp-Admission (Discharged) from 11/16/2023 in [facility] ED from 08/09/2023 in [facility] Emergency Department at [facility]  C-SSRS RISK CATEGORY No Risk No Risk No Risk       Last PHQ 2/9 Scores:    08/13/2020    4:59 PM 08/04/2020    3:16  PM 12/15/2015    7:07 PM  Depression screen PHQ 2/9  Decreased Interest 1 1 0  Down, Depressed, Hopeless 2 2 0  PHQ - 2 Score 3 3 0  Altered sleeping 3 3   Tired, decreased energy 2 2   Change in appetite 2 2   Feeling bad or failure about yourself  1 1   Trouble concentrating 2 2   Moving slowly or fidgety/restless 2 2   Suicidal thoughts 0 0  PHQ-9 Score 15 15   Difficult doing work/chores Somewhat difficult Somewhat difficult     Scribe for Treatment Team: Elza Rafter, Theresia Majors 12/26/2023 11:26 AM

## 2023-12-26 NOTE — Plan of Care (Signed)
  Problem: Education: Goal: Emotional status will improve Outcome: Progressing Goal: Mental status will improve Outcome: Progressing   

## 2023-12-26 NOTE — Group Note (Signed)
Date:  12/26/2023 Time:  11:05 AM  Group Topic/Focus:  Anger Coping Skills and Techniques  The purpose of this group is to allow patients to learn the different coping skills to recognize when they are angry and use breathing techniques to calm down and use their coping skills in a positive way.     Participation Level:  Active  Participation Quality:  Appropriate  Affect:  Appropriate  Cognitive:  Alert and Appropriate  Insight: Appropriate  Engagement in Group:  Engaged  Modes of Intervention:  Activity and Discussion  Additional Comments:    Marta Antu 12/26/2023, 11:05 AM

## 2023-12-27 DIAGNOSIS — F333 Major depressive disorder, recurrent, severe with psychotic symptoms: Secondary | ICD-10-CM | POA: Diagnosis not present

## 2023-12-27 LAB — GLUCOSE, CAPILLARY
Glucose-Capillary: 171 mg/dL — ABNORMAL HIGH (ref 70–99)
Glucose-Capillary: 162 mg/dL — ABNORMAL HIGH (ref 70–99)
Glucose-Capillary: 136 mg/dL — ABNORMAL HIGH (ref 70–99)
Glucose-Capillary: 135 mg/dL — ABNORMAL HIGH (ref 70–99)

## 2023-12-27 NOTE — Group Note (Signed)
Recreation Therapy Group Note   Group Topic:Health and Wellness  Group Date: 12/27/2023 Start Time: 1100 End Time: 1140 Facilitators: Rosina Lowenstein, LRT, CTRS Location:  Dayroom  Group Description: Seated Exercise. LRT discussed the mental and physical benefits of exercise. LRT and group discussed how physical activity can be used as a coping skill. Pt's and LRT followed along to an exercise video on the TV screen that provided a visual representation and audio description of every exercise performed. Pt's encouraged to listen to their bodies and stop at any time if they experience feelings of discomfort or pain. Pts were encouraged to drink water and stay hydrated.   Goal Area(s) Addressed: Patient will learn benefits of physical activity. Patient will identify exercise as a coping skill.  Patient will follow multistep directions. Patient will try a new leisure interest.    Affect/Mood: Appropriate   Participation Level: Moderate   Participation Quality: Independent   Behavior: Calm and Cooperative   Speech/Thought Process: Coherent   Insight: Fair   Judgement: Fair    Modes of Intervention: Activity, Education, and Exploration   Patient Response to Interventions:  Engaged and Receptive   Education Outcome:  Acknowledges education   Clinical Observations/Individualized Feedback: Iridessa was mostly active in their participation of session activities and group discussion. Pt completed some of the exercises as shown. Pt interacted well with LRT and peers duration of session.    Plan: Continue to engage patient in RT group sessions 2-3x/week.   Rosina Lowenstein, LRT, CTRS 12/27/2023 2:01 PM

## 2023-12-27 NOTE — Plan of Care (Signed)
  Problem: Coping: Goal: Ability to verbalize frustrations and anger appropriately will improve Outcome: Progressing Goal: Ability to demonstrate self-control will improve Outcome: Progressing   

## 2023-12-27 NOTE — Plan of Care (Signed)
  Problem: Education: Goal: Utilization of techniques to improve thought processes will improve Outcome: Progressing Goal: Knowledge of the prescribed therapeutic regimen will improve Outcome: Progressing   Problem: Education: Goal: Ability to state activities that reduce stress will improve Outcome: Progressing

## 2023-12-27 NOTE — Progress Notes (Signed)
   12/26/23 2000  Psych Admission Type (Psych Patients Only)  Admission Status Involuntary  Psychosocial Assessment  Patient Complaints None  Eye Contact Fair  Facial Expression Animated  Affect Appropriate to circumstance  Speech Logical/coherent  Interaction Assertive  Motor Activity Slow  Appearance/Hygiene Unremarkable  Behavior Characteristics Cooperative  Mood Pleasant  Thought Process  Coherency WDL  Content WDL  Delusions None reported or observed  Perception WDL  Hallucination None reported or observed  Judgment Limited  Confusion Mild  Danger to Self  Current suicidal ideation? Denies   Pt is pleasant and cooperative with confusion. Visible on the unit. Interacting with peers and staff. Attends group. Po med compliant. PRN given for insomnia. Denies SI/HI/AVH. No c/o pain/discomfort noted.

## 2023-12-27 NOTE — Group Note (Signed)
Date:  12/27/2023 Time:  8:13 PM  Group Topic/Focus:  Personal Choices and Values:   The focus of this group is to help patients assess and explore the importance of values in their lives, how their values affect their decisions, how they express their values and what opposes their expression.    Participation Level:  Active  Participation Quality:  Appropriate  Affect:  Appropriate  Cognitive:  Appropriate  Insight: Good  Engagement in Group:  Engaged  Modes of Intervention:  Discussion  Additional Comments:    Burt Ek 12/27/2023, 8:13 PM

## 2023-12-27 NOTE — Progress Notes (Signed)
Patient admitted IVC on 12.22.24 to [facility] for bizarre behavior and increased confusion. Patient denies this report and now her sister has filed for guardianship. Patient remains confused about her situation and says that her sister is suing her.  Patient denies SI/HI/AVH. She attended group and was moderately involved. Patient takes meds whole without difficulty.  Discharge planning ongoing. Q15 minute unit checks in place.
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
## 2023-12-27 NOTE — BHH Counselor (Signed)
CSW presented pt with "ANSWER AND REPORT BY GUARDIAN AD LITEM FOR HEARING ON INCOMPETENCE"  per document guidelines.   CSW gave copy to patient.   Reynaldo Minium, MSW, [place] 12/27/2023 2:40 PM
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
## 2023-12-28 DIAGNOSIS — F333 Major depressive disorder, recurrent, severe with psychotic symptoms: Secondary | ICD-10-CM | POA: Diagnosis not present

## 2023-12-28 LAB — GLUCOSE, CAPILLARY
Glucose-Capillary: 208 mg/dL — ABNORMAL HIGH (ref 70–99)
Glucose-Capillary: 169 mg/dL — ABNORMAL HIGH (ref 70–99)
Glucose-Capillary: 84 mg/dL (ref 70–99)
Glucose-Capillary: 170 mg/dL — ABNORMAL HIGH (ref 70–99)

## 2023-12-28 NOTE — Plan of Care (Signed)
  Problem: Education: Goal: Utilization of techniques to improve thought processes will improve Outcome: Progressing Goal: Knowledge of the prescribed therapeutic regimen will improve Outcome: Progressing   Problem: Education: Goal: Ability to state activities that reduce stress will improve Outcome: Progressing   Problem: Education: Goal: Knowledge of [place] [facility] Education information/materials will improve Outcome: Progressing Goal: Emotional status will improve Outcome: Progressing Goal: Mental status will improve Outcome: Progressing Goal: Verbalization of understanding the information provided will improve Outcome: Progressing   Problem: Education: Goal: Knowledge of [place] [facility] Education information/materials will improve Outcome: Progressing   Problem: Education: Goal: Knowledge of [place] [facility] Education information/materials will improve Outcome: Progressing Goal: Emotional status will improve Outcome: Progressing Goal: Mental status will improve Outcome: Progressing Goal: Verbalization of understanding the information provided will improve Outcome: Progressing   Problem: Activity: Goal: Interest or engagement in activities will improve Outcome: Progressing Goal: Sleeping patterns will improve Outcome: Progressing   Problem: Coping: Goal: Ability to verbalize frustrations and anger appropriately will improve Outcome: Progressing Goal: Ability to demonstrate self-control will improve Outcome: Progressing   Problem: Health Behavior/Discharge Planning: Goal: Identification of resources available to assist in meeting health care needs will improve Outcome: Progressing Goal: Compliance with treatment plan for underlying cause of condition will improve Outcome: Progressing   Problem: Physical Regulation: Goal: Ability to maintain clinical measurements within normal limits will improve Outcome: Progressing   Problem: Safety: Goal:  Periods of time without injury will increase Outcome: Progressing   Problem: Education: Goal: Ability to describe self-care measures that may prevent or decrease complications (Diabetes Survival Skills Education) will improve Outcome: Progressing Goal: Individualized Educational Video(s) Outcome: Progressing   Problem: Coping: Goal: Ability to adjust to condition or change in health will improve Outcome: Progressing   Problem: Fluid Volume: Goal: Ability to maintain a balanced intake and output will improve Outcome: Progressing   Problem: Health Behavior/Discharge Planning: Goal: Ability to identify and utilize available resources and services will improve Outcome: Progressing Goal: Ability to manage health-related needs will improve Outcome: Progressing   Problem: Metabolic: Goal: Ability to maintain appropriate glucose levels will improve Outcome: Progressing   Problem: Nutritional: Goal: Maintenance of adequate nutrition will improve Outcome: Progressing Goal: Progress toward achieving an optimal weight will improve Outcome: Progressing   Problem: Skin Integrity: Goal: Risk for impaired skin integrity will decrease Outcome: Progressing   Problem: Tissue Perfusion: Goal: Adequacy of tissue perfusion will improve Outcome: Progressing

## 2023-12-28 NOTE — Group Note (Signed)
Recreation Therapy Group Note   Group Topic:Coping Skills  Group Date: 12/28/2023 Start Time: 1500 End Time: 1600 Facilitators: Rosina Lowenstein, LRT, CTRS Location:  Dayroom  Group Description: Coping A-Z. LRT and patients engage in a guided discussion on what coping skills are and gave specific examples. LRT passed out a handout labeled Coping A-Z with blank spaces beside each letter. LRT prompted patients to come up with a coping skill for each of the letters. LRT and patients went over the handout and gave ideas for each letter if anyone had any blanks left on their paper. Patients kept this handout with them that listed 26 different coping skills.   Goal Area(s) Addressed: Patients will be able to define "coping skills". Patient will identify new coping skills.  Patient will increase communication.   Affect/Mood: Appropriate   Participation Level: Moderate   Participation Quality: Moderate Cues   Behavior: Calm, Cooperative, and On-looking   Speech/Thought Process: Disorganized   Insight: Limited   Judgement: Limited   Modes of Intervention: Clarification, Education, Group work, Guided Discussion, Team-building, and Worksheet   Patient Response to Interventions:  Attentive and Receptive   Education Outcome:  Acknowledges education   Clinical Observations/Individualized Feedback: Celine was mostly active in their participation of session activities and group discussion. Pt identified "manage your time and space" as coping skills. Pt was pleasant, although required many cues to help her remember.    Plan: Continue to engage patient in RT group sessions 2-3x/week.   Rosina Lowenstein, LRT, CTRS 12/28/2023 4:38 PM

## 2023-12-28 NOTE — Plan of Care (Signed)
  Problem: Education: Goal: Utilization of techniques to improve thought processes will improve Outcome: Progressing Goal: Knowledge of the prescribed therapeutic regimen will improve Outcome: Progressing   Problem: Education: Goal: Ability to state activities that reduce stress will improve Outcome: Progressing   Problem: Activity: Goal: Interest or engagement in activities will improve Outcome: Progressing Goal: Sleeping patterns will improve Outcome: Progressing   Problem: Coping: Goal: Ability to verbalize frustrations and anger appropriately will improve Outcome: Progressing Goal: Ability to demonstrate self-control will improve Outcome: Progressing

## 2023-12-28 NOTE — Group Note (Signed)
LCSW Group Therapy Note  Group Date: 12/27/2023 Start Time: 1300 End Time: 1330   Type of Therapy and Topic:  Group Therapy - Healthy vs Unhealthy Coping Skills  Participation Level:  Active   Description of Group The focus of this group was to determine what unhealthy coping techniques typically are used by group members and what healthy coping techniques would be helpful in coping with various problems. Patients were guided in becoming aware of the differences between healthy and unhealthy coping techniques. Patients were asked to identify 2-3 healthy coping skills they would like to learn to use more effectively.  Therapeutic Goals Patients learned that coping is what human beings do all day long to deal with various situations in their lives Patients defined and discussed healthy vs unhealthy coping techniques Patients identified their preferred coping techniques and identified whether these were healthy or unhealthy Patients determined 2-3 healthy coping skills they would like to become more familiar with and use more often. Patients provided support and ideas to each other   Summary of Patient Progress: Patient proved open to input from peers and feedback from CSW. Patient demonstrated minimal insight into the subject matter, was respectful of peers, and participated throughout the entire session.   Therapeutic Modalities Cognitive Behavioral Therapy Motivational Interviewing  Elza Rafter, [place] 12/28/2023  1:46 PM
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
## 2023-12-28 NOTE — Group Note (Deleted)
Recreation Therapy Group Note   Group Topic:Leisure Education  Group Date: 12/28/2023 Start Time: 1000 End Time: 1100 Facilitators: Rosina Lowenstein, LRT Location:  Craft Room  Group Description: Leisure. Patients were given the option to choose from singing karaoke, coloring mandalas, using oil pastels, journaling, or playing with play-doh. LRT and pts discussed the meaning of leisure, the importance of participating in leisure during their free time/when they're outside of the hospital, as well as how our leisure interests can also serve as coping skills.   Goal Area(s) Addressed:  Patient will identify a current leisure interest.  Patient will learn the definition of "leisure". Patient will practice making a positive decision. Patient will have the opportunity to try a new leisure activity. Patient will communicate with peers and LRT.        Affect/Mood: {RT BHH Affect/Mood:26271}   Participation Level: {RT BHH Participation Level:26267}   Participation Quality: {RT BHH Participation Quality:26268}   Behavior: {RT BHH Group Behavior:26269}   Speech/Thought Process: {RT BHH Speech/Thought:26276}   Insight: {RT BHH Insight:26272}   Judgement: {RT BHH Judgement:26278}   Modes of Intervention: {RT BHH Modes of Intervention:26277}   Patient Response to Interventions:  {RT BHH Patient Response to Intervention:26274}   Education Outcome:  {RT BHH Education Outcome:26279}   Clinical Observations/Individualized Feedback: *** was *** in their participation of session activities and group discussion. Pt identified ***   Plan: {RT BHH Tx UUVO:53664}   Rosina Lowenstein, LRT,  12/28/2023 12:06 PM

## 2023-12-28 NOTE — Progress Notes (Signed)
   12/27/23 2100  Psych Admission Type (Psych Patients Only)  Admission Status Involuntary  Psychosocial Assessment  Patient Complaints Agitation;Anxiety;Confusion;Worrying  Eye Contact Intense  Facial Expression Anxious  Affect Anxious;Preoccupied  Speech Pressured  Interaction Assertive  Motor Activity Slow  Appearance/Hygiene Unremarkable  Behavior Characteristics Agitated;Anxious;Fidgety;Restless  Mood Anxious;Preoccupied  Thought Process  Coherency Disorganized  Content Blaming others  Delusions WDL  Perception Hallucinations  Hallucination Visual  Judgment Impaired  Confusion Mild  Danger to Self  Current suicidal ideation? Denies

## 2023-12-28 NOTE — Group Note (Signed)
Recreation Therapy Group Note   Group Topic:Leisure Education  Group Date: 12/28/2023 Start Time: 1100 End Time: 1135 Facilitators: Rosina Lowenstein, LRT, CTRS Location:  Dayroom  Group Description: Music. Patients encouraged to name their favorite song(s) for LRT to play song through speaker for group to hear. Patient educated on the definition of leisure and the importance of having different leisure interests outside of the hospital. Group discussed how leisure activities can often be used as Pharmacologist and that listening to music is one example.   Goal Area(s) Addressed:  Patient will identify a current leisure interest.  Patient will practice making a positive decision. Patient will have the opportunity to try a new leisure activity.   Affect/Mood: Appropriate   Participation Level: Active and Engaged   Participation Quality: Independent   Behavior: Appropriate, Calm, and Cooperative   Speech/Thought Process: Coherent   Insight: Good   Judgement: Good   Modes of Intervention: Activity, Education, Exploration, and Music   Patient Response to Interventions:  Attentive, Engaged, Interested , and Receptive   Education Outcome:  Acknowledges education   Clinical Observations/Individualized Feedback: Merelin was mostly active in their participation of session activities and group discussion. Pt was observed singing along to the songs being played. Pt minimally interacted with LRT and peers while present in group.    Plan: Continue to engage patient in RT group sessions 2-3x/week.   [address] LRT, CTRS 12/28/2023 1:36 PM

## 2023-12-28 NOTE — Progress Notes (Signed)
   12/28/23 0615  15 Minute Checks  Location Bedroom  Visual Appearance Calm  Behavior Sleeping  Sleep (Behavioral Health Patients Only)  Calculate sleep? (Click Yes once per 24 hr at 0600 safety check) Yes  Documented sleep last 24 hours 5.75
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
## 2023-12-28 NOTE — Progress Notes (Signed)
Alert and oriented x4. Denies SI/HI/AVH with c/o hip pain. See MAR for pain med administered. 15 min checks performed. Care, comfort, and safety maintained/ongoing.    12/28/23 2113  Psych Admission Type (Psych Patients Only)  Admission Status Involuntary  Psychosocial Assessment  Patient Complaints Confusion;Worrying  Eye Contact Fair  Facial Expression Anxious;Worried  Affect Anxious;Preoccupied  Surveyor, minerals Activity Slow  Appearance/Hygiene Unremarkable  Behavior Characteristics Cooperative;Anxious;Fidgety  Mood Anxious;Preoccupied  Aggressive Behavior  Effect No apparent injury  Thought Process  Coherency Disorganized  Content Preoccupation  Delusions WDL  Perception WDL  Hallucination None reported or observed  Judgment Impaired  Confusion Mild  Danger to Self  Current suicidal ideation? Denies  Agreement Not to Harm Self Yes  Description of Agreement Verbal  Danger to Others  Danger to Others None reported or observed

## 2023-12-28 NOTE — Plan of Care (Addendum)
D: Pt alert and oriented. Pt reports experiencing anxiety however, denies experiencing depression at this time. Pt reports experiencing some hip pain at this time, prn medication given. Pt denies experiencing any SI/HI, or AVH at this time.   A: Scheduled medications administered to pt, per MD orders. Support and encouragement provided. Frequent verbal contact made. Routine safety checks conducted q15 minutes.   R: No adverse drug reactions noted. Pt verbally contracts for safety at this time. Pt compliant with medications and treatment plan. Pt interacts well with others on the unit. Pt remains safe at this time. Plan of care ongoing.  NORVASC was not given this morning d/t low BP. MD notified and aware.   Problem: Education: Goal: Knowledge of the prescribed therapeutic regimen will improve Outcome: Progressing   Problem: Education: Goal: Ability to state activities that reduce stress will improve Outcome: Progressing
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
## 2023-12-28 NOTE — Group Note (Signed)
Date:  12/28/2023 Time:  8:43 PM  Group Topic/Focus:  Wrap-Up Group:   The focus of this group is to help patients review their daily goal of treatment and discuss progress on daily workbooks.    Participation Level:  Active  Participation Quality:  Appropriate  Affect:  Appropriate  Cognitive:  Appropriate  Insight: Appropriate  Engagement in Group:  Engaged  Modes of Intervention:  Discussion   Lenore Cordia 12/28/2023, 8:43 PM

## 2023-12-28 NOTE — [facility] Counselor (Signed)
CSW met with pt and [facility] representative [name].   [name] requests that pt's clinical notes be sent to her.   CSW will send.   Reynaldo Minium, MSW, [place] 12/28/2023 1:27 PM

## 2023-12-29 DIAGNOSIS — F333 Major depressive disorder, recurrent, severe with psychotic symptoms: Secondary | ICD-10-CM | POA: Diagnosis not present

## 2023-12-29 LAB — GLUCOSE, CAPILLARY
Glucose-Capillary: 211 mg/dL — ABNORMAL HIGH (ref 70–99)
Glucose-Capillary: 170 mg/dL — ABNORMAL HIGH (ref 70–99)
Glucose-Capillary: 87 mg/dL (ref 70–99)
Glucose-Capillary: 163 mg/dL — ABNORMAL HIGH (ref 70–99)
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
## 2023-12-29 NOTE — Group Note (Signed)
[facility] LCSW Group Therapy Note    Group Date: 12/29/2023 Start Time: 1400 End Time: 1500  Type of Therapy and Topic:  Group Therapy:  Overcoming Obstacles  Participation Level:  [facility] PARTICIPATION LEVEL: Active  Mood:  Description of Group:   In this group patients will be encouraged to explore what they see as obstacles to their own wellness and recovery. They will be guided to discuss their thoughts, feelings, and behaviors related to these obstacles. The group will process together ways to cope with barriers, with attention given to specific choices patients can make. Each patient will be challenged to identify changes they are motivated to make in order to overcome their obstacles. This group will be process-oriented, with patients participating in exploration of their own experiences as well as giving and receiving support and challenge from other group members.  Therapeutic Goals: 1. Patient will identify personal and current obstacles as they relate to admission. 2. Patient will identify barriers that currently interfere with their wellness or overcoming obstacles.  3. Patient will identify feelings, thought process and behaviors related to these barriers. 4. Patient will identify two changes they are willing to make to overcome these obstacles:    Summary of Patient Progress   Patient was engaged and tearful, but did not participate in coloring activity.    Therapeutic Modalities:   Cognitive Behavioral Therapy Solution Focused Therapy Motivational Interviewing Relapse Prevention Therapy   Starleen Arms, LCSW

## 2023-12-29 NOTE — Plan of Care (Signed)
D: Pt alert and oriented. Pt denies experiencing any anxiety/depression at this time. Pt denies experiencing any pain at this time. Pt denies experiencing any SI/HI, or AVH at this time.   A: Scheduled medications administered to pt, per MD orders. Support and encouragement provided. Frequent verbal contact made. Routine safety checks conducted q15 minutes.   R: No adverse drug reactions noted. Pt verbally contracts for safety at this time. Pt compliant with medications and treatment plan. Pt interacts well with others on the unit. Pt remains safe at this time. Plan of care ongoing.  Pt did not receive morning dose of amlodipine d/t low BP. MD notified and aware.   Pt's confusion and anxiety observed as increasing as the day progresses.   Problem: Education: Goal: Utilization of techniques to improve thought processes will improve Outcome: Progressing   Problem: Education: Goal: Ability to state activities that reduce stress will improve Outcome: Progressing

## 2023-12-29 NOTE — Group Note (Signed)
Date:  12/29/2023 Time:  9:26 PM  Group Topic/Focus:  Coping    Participation Level:  Active  Participation Quality:  Appropriate  Affect:  Appropriate  Cognitive:  Appropriate  Insight: Appropriate  Engagement in Group:  Supportive  Modes of Intervention:  Support  Additional Comments:    Lorenda Ishihara 12/29/2023, 9:26 PM

## 2023-12-29 NOTE — Progress Notes (Signed)
   12/29/23 0600  15 Minute Checks  Location Bedroom  Visual Appearance Calm  Behavior Composed  Sleep (Behavioral Health Patients Only)  Calculate sleep? (Click Yes once per 24 hr at 0600 safety check) Yes  Documented sleep last 24 hours 7.75

## 2023-12-30 DIAGNOSIS — F333 Major depressive disorder, recurrent, severe with psychotic symptoms: Secondary | ICD-10-CM | POA: Diagnosis not present

## 2023-12-30 LAB — GLUCOSE, CAPILLARY
Glucose-Capillary: 178 mg/dL — ABNORMAL HIGH (ref 70–99)
Glucose-Capillary: 131 mg/dL — ABNORMAL HIGH (ref 70–99)
Glucose-Capillary: 115 mg/dL — ABNORMAL HIGH (ref 70–99)
Glucose-Capillary: 56 mg/dL — ABNORMAL LOW (ref 70–99)
Glucose-Capillary: 135 mg/dL — ABNORMAL HIGH (ref 70–99)

## 2023-12-30 NOTE — Progress Notes (Signed)
Latest Reference Range & Units 12/30/23 19:29 12/30/23 20:11  Glucose-Capillary 70 - 99 mg/dL 56 (L) 161 (H)  (L): Data is abnormally low Orange juice given at 1930 Pt is alert and oriented by 3. (H): Data is abnormally high
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
## 2023-12-30 NOTE — Group Note (Signed)
Date:  12/30/2023 Time:  11:23 PM  Group Topic/Focus:  Wrap-Up Group:   The focus of this group is to help patients review their daily goal of treatment and discuss progress on daily workbooks.    Participation Level:  Active  Participation Quality:  Appropriate  Affect:  Appropriate  Cognitive:  Appropriate  Insight: Good  Engagement in Group:  Engaged  Modes of Intervention:  Discussion  Additional Comments:    [name] 12/30/2023, 11:23 PM

## 2023-12-30 NOTE — Plan of Care (Signed)
Patient verbalized about "stress about what people think of her". She stated she heard people talking about her but could not give any tangible explanation. Pt. Seemed paranoia last evening

## 2023-12-30 NOTE — Progress Notes (Addendum)
Patient can more easily swallow medication whole in applesauce.

## 2023-12-30 NOTE — Group Note (Signed)
Date:  12/30/2023 Time:  11:17 AM  Group Topic/Focus:  Music Therapy/Coloring Sheet Activity The purpose of this group is to allow patients to listen to soothing music while coloring a positive affirmations coloring sheet    Participation Level:  Active  Participation Quality:  Appropriate  Affect:  Appropriate  Cognitive:  Disorganized and Lacking  Insight: Appropriate  Engagement in Group:  Engaged  Modes of Intervention:  Activity  Additional Comments:    Marta Antu 12/30/2023, 11:17 AM

## 2023-12-30 NOTE — Plan of Care (Signed)
Problem: Education: Goal: Emotional status will improve Outcome: Not Progressing Goal: Mental status will improve Outcome: Not Progressing

## 2023-12-30 NOTE — Progress Notes (Signed)
   12/30/23 1200  Psych Admission Type (Psych Patients Only)  Admission Status Involuntary  Psychosocial Assessment  Patient Complaints Crying spells  Eye Contact Brief  Facial Expression Anxious  Affect Preoccupied  Speech Logical/coherent  Interaction Cautious  Motor Activity Slow  Appearance/Hygiene In scrubs  Behavior Characteristics Anxious  Mood Preoccupied  Thought Process  Coherency Disorganized  Content Preoccupation  Delusions WDL  Perception WDL  Hallucination None reported or observed  Judgment Impaired  Confusion Mild  Danger to Self  Current suicidal ideation? Denies  Agreement Not to Harm Self Yes  Description of Agreement verbal  Danger to Others  Danger to Others None reported or observed

## 2023-12-30 NOTE — Progress Notes (Signed)
Patient became tearful and began to pack up her clothes. Patient unable to self calm. MD spoke with patient and patient asked to receive something to help her feel better. Zyprexa 5mg  adm at 1255. Upon follow up, medication appeared effective.

## 2023-12-31 DIAGNOSIS — F333 Major depressive disorder, recurrent, severe with psychotic symptoms: Secondary | ICD-10-CM | POA: Diagnosis not present

## 2023-12-31 LAB — GLUCOSE, CAPILLARY
Glucose-Capillary: 189 mg/dL — ABNORMAL HIGH (ref 70–99)
Glucose-Capillary: 138 mg/dL — ABNORMAL HIGH (ref 70–99)
Glucose-Capillary: 91 mg/dL (ref 70–99)
Glucose-Capillary: 105 mg/dL — ABNORMAL HIGH (ref 70–99)

## 2023-12-31 MED ORDER — TUBERCULIN PPD 5 UNIT/0.1ML ID SOLN
5.0000 [IU] | Freq: Once | INTRADERMAL | Status: AC
  Administered 2023-12-31: 5 [IU] via INTRADERMAL
  Filled 2023-12-31: qty 0.1

## 2023-12-31 MED ORDER — CARBAMIDE PEROXIDE 6.5 % OT SOLN
5.0000 [drp] | Freq: Two times a day (BID) | OTIC | Status: AC
  Administered 2023-12-31 – 2024-01-02 (×6): 5 [drp] via OTIC
  Filled 2023-12-31 (×2): qty 15
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
## 2023-12-31 NOTE — Progress Notes (Signed)
Patient admitted IVC on 12.22.24 to [facility] for bizarre behavior and increased confusion. Patient refutes these reports.  Patient appears more and more confused as the days go by. She had another crying spell today but was able to self calm. Support provided. No PRN medication needed.   Patient attended rec therapy group and was actively engaged.The chaplain also stopped by to meet with patient.  Q15 minute unit checks in place.

## 2023-12-31 NOTE — Group Note (Signed)
Recreation Therapy Group Note   Group Topic:Communication  Group Date: 12/31/2023 Start Time: 1400 End Time: 1440 Facilitators: Rosina Lowenstein, LRT, CTRS Location:  Dayroom  Group Description: Name That Food. Pt will randomly select 1 cut paper from laminated stack of popular food images from the 1970's-1990's from LRT. Without showing the group the image they selected, pt will use descriptive words to explain what food they selected. Once the image is correctly guessed, LRT and pts will reminisce and discuss past fond memories associated with the food as well as the importance of communication.  Goal Area(s) Addressed: Patient will increase communication skills.  Patient will increase frustration tolerance skills. Patient will reminisce a fond memory in their life.     Affect/Mood: Appropriate   Participation Level: Active and Engaged   Participation Quality: Independent   Behavior: Calm and Cooperative   Speech/Thought Process: Loose association   Insight: Fair   Judgement: Fair    Modes of Intervention: Exploration, Guided Discussion, Socialization, and Support   Patient Response to Interventions:  Attentive and Receptive   Education Outcome:  Acknowledges education   Clinical Observations/Individualized Feedback: Jazzlin was active in their participation of session activities and group discussion. Pt spontaneously contributed to group discussion while present in group. Pt made random comments about her parents and them filling out paperwork that is in her room. Pt required redirection that everything was okay and she was where she needed to be in that moment.    Plan: Continue to engage patient in RT group sessions 2-3x/week.   Rosina Lowenstein, LRT, CTRS 12/31/2023 4:30 PM

## 2023-12-31 NOTE — Progress Notes (Addendum)
Attempted to obtain Covid test from the lab but the lab is unable to see where there is an order present and thus unable to send. Will report off to next shift this information.  The Covid test is for facility placement.

## 2023-12-31 NOTE — Progress Notes (Signed)
   12/31/23 1100  Spiritual Encounters  Type of Visit Initial  Care provided to: Patient  Conversation partners present during encounter Social worker/Care management/TOC;Nurse  Referral source Chaplain assessment;Social worker/Care management/TOC  Reason for visit Routine spiritual support  OnCall Visit No  Spiritual Framework  Presenting Themes Meaning/purpose/sources of inspiration;Goals in life/care;Rituals and practive;Impactful experiences and emotions   Social Worker asked chaplain to speak with the patient because she has been feeling very confused and sad. Chaplain spoke with patient and she was crying and feeling like she is misunderstood. Patient feels like no one likes her or wants her around. Chaplain talked with the patient and expressed that the team cares about her well being and is here to support her. I told her I also am here to support her in anyway she needs. Patient told the chaplain that I have a gorgeous soul and that she appreciates me being nice to her. Social worker shared with the chaplain that the patient is suffering with dementia and has been struggling. Chaplain will continue to follow up with patient.

## 2023-12-31 NOTE — BH IP Treatment Plan (Signed)
Interdisciplinary Treatment and Diagnostic Plan Update  12/31/2023 Time of Session: 9:30 AM  Natasha Chandler MRN: [id number]  Principal Diagnosis: MDD (major depressive disorder), recurrent, severe, with psychosis (HCC)  Secondary Diagnoses: Principal Problem:   MDD (major depressive disorder), recurrent, severe, with psychosis (HCC)   Current Medications:  Current Facility-Administered Medications  Medication Dose Route Frequency Provider Last Rate Last Admin   acetaminophen (TYLENOL) tablet 650 mg  650 mg Oral Q6H PRN Lewanda Rife, MD   650 mg at 12/30/23 2130   albuterol (PROVENTIL) (2.5 MG/3ML) 0.083% nebulizer solution 3 mL  3 mL Inhalation Q6H PRN Lamar Sprinkles, MD       alum & mag hydroxide-simeth (MAALOX/MYLANTA) 200-200-20 MG/5ML suspension 30 mL  30 mL Oral Q4H PRN Lamar Sprinkles, MD       amLODipine (NORVASC) tablet 10 mg  10 mg Oral Daily Lamar Sprinkles, MD   10 mg at 12/31/23 7829   aspirin EC tablet 81 mg  81 mg Oral Daily Sarina Ill, DO   81 mg at 12/31/23 0906   carbamide peroxide (DEBROX) 6.5 % OTIC (EAR) solution 5 drop  5 drop Both EARS BID Verner Chol, MD       donepezil (ARICEPT) tablet 10 mg  10 mg Oral QHS Massengill, Harrold Donath, MD   10 mg at 12/30/23 2130   fluticasone (FLONASE) 50 MCG/ACT nasal spray 2 spray  2 spray Each Nare Daily PRN Lamar Sprinkles, MD       hydrochlorothiazide (HYDRODIURIL) tablet 12.5 mg  12.5 mg Oral Daily Sarina Ill, DO   12.5 mg at 12/31/23 5621   insulin aspart (novoLOG) injection 0-5 Units  0-5 Units Subcutaneous QHS Sarina Ill, DO   2 Units at 12/13/23 2133   insulin aspart (novoLOG) injection 0-9 Units  0-9 Units Subcutaneous TID WC Sarina Ill, DO   1 Units at 12/31/23 1228   insulin aspart (novoLOG) injection 8 Units  8 Units Subcutaneous TID WC Sarina Ill, DO   8 Units at 12/31/23 1228   levothyroxine (SYNTHROID) tablet 50 mcg  50 mcg Oral Q0600 Sarina Ill, DO   50 mcg at 12/31/23 0610   lip balm (BLISTEX) ointment 1 Application  1 Application Topical PRN Rockwell Alexandria, RPH       loperamide (IMODIUM) capsule 2 mg  2 mg Oral PRN Lewanda Rife, MD   2 mg at 12/13/23 1254   loratadine (CLARITIN) tablet 10 mg  10 mg Oral Daily PRN Lamar Sprinkles, MD       losartan (COZAAR) tablet 100 mg  100 mg Oral Daily Lamar Sprinkles, MD   100 mg at 12/31/23 3086   magnesium hydroxide (MILK OF MAGNESIA) suspension 30 mL  30 mL Oral Daily PRN Lamar Sprinkles, MD       menthol-cetylpyridinium (CEPACOL) lozenge 3 mg  1 lozenge Oral PRN Dixon, Rashaun M, NP       mometasone-formoterol (DULERA) 200-5 MCG/ACT inhaler 2 puff  2 puff Inhalation BID Lamar Sprinkles, MD   2 puff at 12/31/23 0905   OLANZapine zydis (ZYPREXA) disintegrating tablet 5 mg  5 mg Oral TID PRN Lamar Sprinkles, MD   5 mg at 12/30/23 1255   ondansetron (ZOFRAN) tablet 4 mg  4 mg Oral Q6H PRN Lamar Sprinkles, MD       Or   ondansetron (ZOFRAN) injection 4 mg  4 mg Intravenous Q6H PRN Lamar Sprinkles, MD       risperiDONE (RISPERDAL) tablet 0.5 mg  0.5 mg Oral QHS Massengill, Harrold Donath, MD   0.5 mg at 12/30/23 2129   rosuvastatin (CRESTOR) tablet 20 mg  20 mg Oral Daily Lamar Sprinkles, MD   20 mg at 12/31/23 1610   senna-docusate (Senokot-S) tablet 1 tablet  1 tablet Oral QHS PRN Lamar Sprinkles, MD       sertraline (ZOLOFT) tablet 100 mg  100 mg Oral Daily Lamar Sprinkles, MD   100 mg at 12/31/23 9604   traZODone (DESYREL) tablet 50 mg  50 mg Oral QHS PRN Jearld Lesch, NP   50 mg at 12/30/23 2129   PTA Medications: Medications Prior to Admission  Medication Sig Dispense Refill Last Dose/Taking   albuterol (PROVENTIL HFA;VENTOLIN HFA) 108 (90 Base) MCG/ACT inhaler Inhale 1-2 puffs into the lungs every 6 (six) hours as needed for wheezing or shortness of breath.       amLODipine (NORVASC) 10 MG tablet Take 10 mg by mouth in the morning.      aspirin 81 MG chewable tablet Chew 1 tablet  (81 mg total) by mouth daily.      diclofenac (VOLTAREN) 50 MG EC tablet Take 50 mg by mouth 2 (two) times daily.      donepezil (ARICEPT) 5 MG tablet Take 5 mg by mouth at bedtime.      fluticasone (FLONASE) 50 MCG/ACT nasal spray Place 2 sprays into both nostrils daily as needed for allergies.      hydrochlorothiazide (HYDRODIURIL) 12.5 MG tablet Take 12.5 mg by mouth daily.      Insulin Aspart FlexPen (NOVOLOG) 100 UNIT/ML Inject 3 Units into the skin 3 (three) times daily with meals.      insulin degludec (TRESIBA) 100 UNIT/ML FlexTouch Pen Inject 10 Units into the skin daily.      Insulin Pen Needle 31G X 5 MM MISC Use to inject insulin once daily 30 each 5    levothyroxine (SYNTHROID) 50 MCG tablet Take 50 mcg by mouth daily before breakfast.      lip balm (CARMEX) ointment Apply topically as needed.      loratadine (CLARITIN) 10 MG tablet Take 1 tablet (10 mg total) by mouth daily as needed for allergies.      losartan (COZAAR) 100 MG tablet Take 1 tablet (100 mg total) by mouth daily. (Patient taking differently: Take 100 mg by mouth at bedtime.)      pioglitazone (ACTOS) 45 MG tablet Take 45 mg by mouth daily.      risperiDONE (RISPERDAL M-TABS) 0.5 MG disintegrating tablet Take 1 tablet (0.5 mg total) by mouth at bedtime.      rosuvastatin (CRESTOR) 20 MG tablet Take 20 mg by mouth at bedtime.      Semaglutide, 2 MG/DOSE, (OZEMPIC, 2 MG/DOSE,) 8 MG/3ML SOPN Inject 0.75 mLs (2 mg total) into the skin every 7 days. 9 mL 1    sertraline (ZOLOFT) 100 MG tablet Take 1 tablet (100 mg total) by mouth daily.      Vitamin D, Ergocalciferol, (DRISDOL) 1.25 MG (50000 UNIT) CAPS capsule Take 50,000 Units by mouth every Sunday. Thursdays       Patient Stressors: Health problems   Marital or family conflict    Patient Strengths: Ability for insight  Capable of independent living  Motivation for treatment/growth  Supportive family/friends   Treatment Modalities: Medication Management, Group  therapy, Case management,  1 to 1 session with clinician, Psychoeducation, Recreational therapy.   Physician Treatment Plan for Primary Diagnosis: MDD (major depressive disorder), recurrent, severe,  with psychosis (HCC) Long Term Goal(s): Improvement in symptoms so as ready for discharge   Short Term Goals: Ability to identify changes in lifestyle to reduce recurrence of condition will improve Ability to verbalize feelings will improve Ability to disclose and discuss suicidal ideas Ability to demonstrate self-control will improve Ability to identify and develop effective coping behaviors will improve Ability to maintain clinical measurements within normal limits will improve Compliance with prescribed medications will improve Ability to identify triggers associated with substance abuse/mental health issues will improve  Medication Management: Evaluate patient's response, side effects, and tolerance of medication regimen.  Therapeutic Interventions: 1 to 1 sessions, Unit Group sessions and Medication administration.  Evaluation of Outcomes: Not Progressing  Physician Treatment Plan for Secondary Diagnosis: Principal Problem:   MDD (major depressive disorder), recurrent, severe, with psychosis (HCC)  Long Term Goal(s): Improvement in symptoms so as ready for discharge   Short Term Goals: Ability to identify changes in lifestyle to reduce recurrence of condition will improve Ability to verbalize feelings will improve Ability to disclose and discuss suicidal ideas Ability to demonstrate self-control will improve Ability to identify and develop effective coping behaviors will improve Ability to maintain clinical measurements within normal limits will improve Compliance with prescribed medications will improve Ability to identify triggers associated with substance abuse/mental health issues will improve     Medication Management: Evaluate patient's response, side effects, and tolerance  of medication regimen.  Therapeutic Interventions: 1 to 1 sessions, Unit Group sessions and Medication administration.  Evaluation of Outcomes: Not Progressing   RN Treatment Plan for Primary Diagnosis: MDD (major depressive disorder), recurrent, severe, with psychosis (HCC) Long Term Goal(s): Knowledge of disease and therapeutic regimen to maintain health will improve  Short Term Goals: Ability to remain free from injury will improve, Ability to verbalize frustration and anger appropriately will improve, Ability to demonstrate self-control, Ability to participate in decision making will improve, Ability to verbalize feelings will improve, Ability to disclose and discuss suicidal ideas, Ability to identify and develop effective coping behaviors will improve, and Compliance with prescribed medications will improve  Medication Management: RN will administer medications as ordered by provider, will assess and evaluate patient's response and provide education to patient for prescribed medication. RN will report any adverse and/or side effects to prescribing provider.  Therapeutic Interventions: 1 on 1 counseling sessions, Psychoeducation, Medication administration, Evaluate responses to treatment, Monitor vital signs and CBGs as ordered, Perform/monitor CIWA, COWS, AIMS and Fall Risk screenings as ordered, Perform wound care treatments as ordered.  Evaluation of Outcomes: Not Progressing   LCSW Treatment Plan for Primary Diagnosis: MDD (major depressive disorder), recurrent, severe, with psychosis (HCC) Long Term Goal(s): Safe transition to appropriate next level of care at discharge, Engage patient in therapeutic group addressing interpersonal concerns.  Short Term Goals: Engage patient in aftercare planning with referrals and resources, Increase social support, Increase ability to appropriately verbalize feelings, Increase emotional regulation, Facilitate acceptance of mental health diagnosis and  concerns, Facilitate patient progression through stages of change regarding substance use diagnoses and concerns, Identify triggers associated with mental health/substance abuse issues, and Increase skills for wellness and recovery  Therapeutic Interventions: Assess for all discharge needs, 1 to 1 time with Social worker, Explore available resources and support systems, Assess for adequacy in community support network, Educate family and significant other(s) on suicide prevention, Complete Psychosocial Assessment, Interpersonal group therapy.  Evaluation of Outcomes: Not Progressing   Progress in Treatment: Attending groups: Yes. and No. Participating in groups: Yes.  and No. Taking medication as prescribed: Yes. Toleration medication: Yes. Family/Significant other contact made: Yes, individual(s) contacted:  SPE completed with the patient's sister. Patient understands diagnosis: Yes. Discussing patient identified problems/goals with staff: Yes. Medical problems stabilized or resolved: Yes. Denies suicidal/homicidal ideation: Yes. Issues/concerns per patient self-inventory: Yes. Other: none   New problem(s) identified: No, Describe:  None identified 12/01/23 Update: Pt is mildly confused but pleasant. Update 12/06/23: No changes at this time. Update: 12/16/2023: No changes at this time.  Update 12/21/2023:  No changes at this time. Update 12/26/23: No changes at this time Update 12/31/23: No changes at this time    New Short Term/Long Term Goal(s):elimination of symptoms of psychosis, medication management for mood stabilization; elimination of SI thoughts; development of comprehensive mental wellness plan. 12/01/23 Update: Goal to remain the same. Update 12/06/23: No changes at this time Update 12/11/23: No changes at this time.  Update: 12/16/2023: No changes at this time.  Update: 12/16/2023: No changes at this time.   Update 12/21/2023:  No changes at this time. Update 12/26/23: No changes at this time  Update 12/31/23: No changes at this time       Patient Goals:  "Besides getting back to my family, getting back to my life, I have a two bedroom house" 12/01/23 Update: Pt goal to remain the same. Update 12/06/23: No changes at this time Update 12/11/23: No changes at this time.  Update: 12/16/2023: No changes at this time.   Update 12/21/2023:  No changes at this time. Update 12/26/23: No changes at this time Update 12/31/23: No changes at this time       Discharge Plan or Barriers: CSW will assist with appropriate discharge planning  12/01/23 Update: Discharge plan to remain the same. Update 12/06/23: Pt's sister is getting guardianship of pt and has a court date on 12/28/23. Update 12/11/23: Pt was served guardianship paperwork. Pt has guardianship hearing on 12/28/23.  Update: 12/16/2023: No changes at this time.  Update 12/21/2023:  Patient is ready for discharge at this time.  Patient's sister and additional family are declining to pick the patient up for discharge.  They report they are working on placement, however, "that will take some time".  Update 12/26/23: Patient continues to be ready for discharge. Pt has guardianship hearing in 12/28/23. Pt will be discharged following sister taking guardianship Update 12/31/23: Pt to be assigned state appointed guardian instead of sister.      Reason for Continuation of Hospitalization: Depression Medication stabilization 12/01/23 Update: Depression Medication stabilization. Update 12/06/23: No changes at this time Update 12/11/23: No changes at this time .  Update: 12/16/2023: No changes at this time.   Update 12/21/2023:  No changes at this time. Update 12/26/23: No changes at this time Update 12/31/23: No changes at this time       Estimated Length of Stay:1 to 7 days 12/01/23 Update: TBD Update 12/06/23: TBD Update 12/11/23: TBD.  Update: 12/16/2023: TBD   Update 12/21/2023:  TBD Update 12/26/23: 12/28/23 Update 12/31/23: TBD    Last 3 [place] Suicide Severity Risk  Score: Flowsheet Row Admission (Current) from 11/25/2023 in [facility] ED to Hosp-Admission (Discharged) from 11/16/2023 in [facility] ED from 08/09/2023 in [facility] Emergency Department at [facility]  C-SSRS RISK CATEGORY No Risk No Risk No Risk       Last PHQ 2/9 Scores:    08/13/2020    4:59 PM 08/04/2020  3:16 PM 12/15/2015    7:07 PM  Depression screen PHQ 2/9  Decreased Interest 1 1 0  Down, Depressed, Hopeless 2 2 0  PHQ - 2 Score 3 3 0  Altered sleeping 3 3   Tired, decreased energy 2 2   Change in appetite 2 2   Feeling bad or failure about yourself  1 1   Trouble concentrating 2 2   Moving slowly or fidgety/restless 2 2   Suicidal thoughts 0 0   PHQ-9 Score 15 15   Difficult doing work/chores Somewhat difficult Somewhat difficult     Scribe for Treatment Team: Elza Rafter, LCSWA 12/31/2023 1:20 PM

## 2023-12-31 NOTE — Inpatient Diabetes Management (Signed)
Inpatient Diabetes Program Recommendations  AACE/ADA: New Consensus Statement on Inpatient Glycemic Control   Target Ranges:  Prepandial:   less than 140 mg/dL      Peak postprandial:   less than 180 mg/dL (1-2 hours)      Critically ill patients:  140 - 180 mg/dL    Latest Reference Range & Units 12/30/23 07:18 12/30/23 11:30 12/30/23 16:02 12/30/23 19:29 12/30/23 20:11 12/31/23 07:09  Glucose-Capillary 70 - 99 mg/dL 956 (H) 213 (H) 086 (H) 56 (L) 135 (H) 189 (H)   Review of Glycemic Control  Diabetes history: DM2 Outpatient Diabetes medications: Tresiba 10 units daily, Novolog 3 units TId with meals Current orders for Inpatient glycemic control: Novolog 8 units TID with meals, Novolog 0-9 units TID with meals, Novolog 0-5 units QHS   Inpatient Diabetes Program Recommendations:     Insulin: Please consider decreasing meal coverage to Novolog 6 units TID if patient eats at least 50% of meal.   Thanks, Orlando Penner, RN, MSN, CDCES Diabetes Coordinator Inpatient Diabetes Program [phone] (Team Pager from 8am to 5pm)

## 2023-12-31 NOTE — Progress Notes (Signed)
PPD placed on left forearm. Area of induration circled with a marker.

## 2023-12-31 NOTE — Plan of Care (Signed)
Problem: Education: Goal: Emotional status will improve Outcome: Not Progressing Goal: Mental status will improve Outcome: Not Progressing

## 2023-12-31 NOTE — Plan of Care (Signed)
  Problem: Education: Goal: Utilization of techniques to improve thought processes will improve Outcome: Progressing Goal: Knowledge of the prescribed therapeutic regimen will improve Outcome: Progressing   Problem: Education: Goal: Ability to state activities that reduce stress will improve Outcome: Progressing   Problem: Education: Goal: Emotional status will improve Outcome: Progressing  Patient is so confused required lots of redirections walking around with lots of books and clothes. Very forgetful but pleasant. Compliant with medications denies SI/HI/A/VH and verbally contracts for safety. Support and encouragement ongoing.

## 2023-12-31 NOTE — Group Note (Signed)
Date:  01/01/2024 Time:  12:01 AM  Group Topic/Focus:  Wrap-Up Group:   The focus of this group is to help patients review their daily goal of treatment and discuss progress on daily workbooks.    Participation Level:  Minimal  Participation Quality:  Redirectable  Affect:  Anxious  Cognitive:  Confused  Insight: Limited  Engagement in Group:  Distracting and Off Topic  Modes of Intervention:  Discussion  Additional Comments:    Natasha Chandler 01/01/2024, 12:01 AM

## 2024-01-01 DIAGNOSIS — F333 Major depressive disorder, recurrent, severe with psychotic symptoms: Secondary | ICD-10-CM | POA: Diagnosis not present

## 2024-01-01 LAB — URINALYSIS, COMPLETE (UACMP) WITH MICROSCOPIC
Bacteria, UA: NONE SEEN
Bilirubin Urine: NEGATIVE
Glucose, UA: NEGATIVE mg/dL
Hgb urine dipstick: NEGATIVE
Ketones, ur: NEGATIVE mg/dL
Nitrite: NEGATIVE
Protein, ur: NEGATIVE mg/dL
Specific Gravity, Urine: 1.013 (ref 1.005–1.030)
WBC, UA: >50 WBC/hpf (ref 0–5)
pH: 5 (ref 5.0–8.0)

## 2024-01-01 LAB — GLUCOSE, CAPILLARY
Glucose-Capillary: 195 mg/dL — ABNORMAL HIGH (ref 70–99)
Glucose-Capillary: 181 mg/dL — ABNORMAL HIGH (ref 70–99)
Glucose-Capillary: 104 mg/dL — ABNORMAL HIGH (ref 70–99)
Glucose-Capillary: 283 mg/dL — ABNORMAL HIGH (ref 70–99)

## 2024-01-01 NOTE — Group Note (Signed)
Recreation Therapy Group Note   Group Topic:Leisure Education  Group Date: 01/01/2024 Start Time: 1400 End Time: 1445 Facilitators: Rosina Lowenstein, LRT, CTRS Location:  Courtyard  Group Description: Music. Patients encouraged to name their favorite song(s) for LRT to play song through speaker for group to hear. Patient educated on the definition of leisure and the importance of having different leisure interests outside of the hospital. Group discussed how leisure activities can often be used as Pharmacologist and that listening to music is one example.   Goal Area(s) Addressed:  Patient will identify a current leisure interest.  Patient will practice making a positive decision. Patient will have the opportunity to try a new leisure activity.   Affect/Mood: N/A   Participation Level: Did not attend    Clinical Observations/Individualized Feedback: Patient did not attend group.   Plan: Continue to engage patient in RT group sessions 2-3x/week.   Rosina Lowenstein, LRT, CTRS 01/01/2024 4:50 PM

## 2024-01-01 NOTE — Plan of Care (Signed)
m

## 2024-01-01 NOTE — Group Note (Signed)
Date:  01/01/2024 Time:  9:16 PM  Group Topic/Focus:  Wrap-Up Group:   The focus of this group is to help patients review their daily goal of treatment and discuss progress on daily workbooks.    Participation Level:  Minimal  Participation Quality:  Redirectable  Affect:  Appropriate  Cognitive:  Appropriate  Insight: Lacking  Engagement in Group:  Lacking  Modes of Intervention:  Discussion  Additional Comments:    [name] 01/01/2024, 9:16 PM

## 2024-01-01 NOTE — Plan of Care (Signed)
Problem: Education: Goal: Utilization of techniques to improve thought processes will improve Outcome: Progressing Goal: Knowledge of the prescribed therapeutic regimen will improve Outcome: Progressing   Problem: Education: Goal: Ability to state activities that reduce stress will improve Outcome: Progressing   Problem: Education: Goal: Knowledge of [place] [facility] Education information/materials will improve Outcome: Progressing Goal: Emotional status will improve Outcome: Progressing Goal: Mental status will improve Outcome: Progressing Goal: Verbalization of understanding the information provided will improve Outcome: Progressing

## 2024-01-01 NOTE — Progress Notes (Signed)
   01/01/24 1243  Psych Admission Type (Psych Patients Only)  Admission Status Involuntary  Psychosocial Assessment  Patient Complaints Crying spells;Confusion;Worrying  Eye Contact Fair  Facial Expression Anxious  Affect Anxious;Preoccupied  Speech Logical/coherent  Interaction Assertive  Motor Activity Slow  Appearance/Hygiene Unremarkable  Behavior Characteristics Anxious  Mood Anxious;Preoccupied;Pleasant  Thought Process  Coherency Disorganized  Content Preoccupation  Delusions None reported or observed  Perception WDL  Hallucination None reported or observed  Judgment Impaired  Confusion Mild  Danger to Self  Current suicidal ideation? Denies  Agreement Not to Harm Self Yes  Description of Agreement verbal  Danger to Others  Danger to Others None reported or observed
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
## 2024-01-01 NOTE — Progress Notes (Signed)
   01/01/24 2000  Psych Admission Type (Psych Patients Only)  Admission Status Involuntary  Psychosocial Assessment  Patient Complaints Anxiety;Confusion  Eye Contact Fair  Facial Expression Anxious  Affect Anxious;Preoccupied  Speech Logical/coherent  Interaction Assertive  Motor Activity Slow  Behavior Characteristics Cooperative;Anxious  Mood Anxious;Preoccupied;Pleasant  Thought Process  Coherency Disorganized  Content Preoccupation  Delusions None reported or observed  Perception WDL  Hallucination None reported or observed  Judgment Impaired  Confusion Mild  Danger to Self  Current suicidal ideation? Denies  Agreement Not to Harm Self Yes  Description of Agreement verbal  Danger to Others  Danger to Others None reported or observed   Progress note   D: Pt seen in dayroom. Pt denies SI, HI, AVH. Pt rates pain  4/10 as an acute pain in her left side. Pt endorses anxiety and denies depression. Pt still pleasantly confused to situation. States that she felt like she had ear wax in her ears. No ear wax noted when ears irrigated. Pt seen in milieu and attending groups. No other concerns noted at this time.  A: Pt provided support and encouragement. Pt given scheduled medication as prescribed. PRNs as appropriate. Q15 min checks for safety.   R: Pt safe on the unit. Will continue to monitor.

## 2024-01-02 LAB — GLUCOSE, CAPILLARY
Glucose-Capillary: 211 mg/dL — ABNORMAL HIGH (ref 70–99)
Glucose-Capillary: 96 mg/dL (ref 70–99)
Glucose-Capillary: 123 mg/dL — ABNORMAL HIGH (ref 70–99)
Glucose-Capillary: 130 mg/dL — ABNORMAL HIGH (ref 70–99)

## 2024-01-02 LAB — SARS CORONAVIRUS 2 (TAT 6-24 HRS): SARS Coronavirus 2: NEGATIVE

## 2024-01-02 MED ORDER — QUETIAPINE FUMARATE 25 MG PO TABS
50.0000 mg | ORAL_TABLET | Freq: Every day | ORAL | Status: DC
  Administered 2024-01-02 – 2024-01-10 (×9): 50 mg via ORAL
  Filled 2024-01-02 (×9): qty 2

## 2024-01-02 NOTE — Progress Notes (Signed)
Pt awake reporting she had an incidence of incontinence. This Clinical research associate and MHT assisted pt to remove sheets and change bedding. Pt concerned about this incidence. "Is there any medicine for this?" Pt encouraged to reach out to provider with her questions.

## 2024-01-02 NOTE — Progress Notes (Signed)
   01/02/24 2000  Psych Admission Type (Psych Patients Only)  Admission Status Involuntary  Psychosocial Assessment  Patient Complaints Crying spells  Eye Contact Fair  Facial Expression Anxious;Worried  Affect Preoccupied;Anxious  Speech Logical/coherent  Interaction Assertive  Motor Activity Slow  Appearance/Hygiene Unremarkable  Behavior Characteristics Cooperative;Anxious  Mood Anxious;Preoccupied;Pleasant  Thought Process  Coherency Disorganized  Content Preoccupation;Blaming others  Delusions None reported or observed  Perception WDL  Hallucination None reported or observed  Judgment Impaired  Confusion Moderate  Danger to Self  Current suicidal ideation? Denies  Agreement Not to Harm Self Yes  Description of Agreement verbal  Danger to Others  Danger to Others None reported or observed
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
## 2024-01-02 NOTE — Progress Notes (Signed)
   01/02/24 0610  15 Minute Checks  Location Dayroom  Visual Appearance Calm  Behavior Composed  Sleep (Behavioral Health Patients Only)  Calculate sleep? (Click Yes once per 24 hr at 0600 safety check) Yes  Documented sleep last 24 hours 7.5

## 2024-01-02 NOTE — Plan of Care (Signed)
Problem: Education: Goal: Ability to state activities that reduce stress will improve Outcome: Progressing

## 2024-01-02 NOTE — Progress Notes (Signed)
   01/02/24 1100  Psych Admission Type (Psych Patients Only)  Admission Status Involuntary  Psychosocial Assessment  Patient Complaints Anxiety;Confusion  Eye Contact Fair  Facial Expression Anxious  Affect Anxious;Preoccupied  Speech Logical/coherent  Interaction Assertive  Motor Activity Slow  Appearance/Hygiene Unremarkable  Behavior Characteristics Cooperative;Anxious  Mood Anxious;Pleasant  Thought Process  Coherency Disorganized  Content Preoccupation  Delusions None reported or observed  Perception WDL  Hallucination None reported or observed  Judgment Impaired  Confusion Mild  Danger to Self  Current suicidal ideation? Denies  Agreement Not to Harm Self Yes  Description of Agreement verbal  Danger to Others  Danger to Others None reported or observed

## 2024-01-03 DIAGNOSIS — F333 Major depressive disorder, recurrent, severe with psychotic symptoms: Secondary | ICD-10-CM | POA: Diagnosis not present

## 2024-01-03 LAB — CBC WITH DIFFERENTIAL/PLATELET
Abs Immature Granulocytes: 0.03 10*3/uL (ref 0.00–0.07)
Basophils Absolute: 0.1 10*3/uL (ref 0.0–0.1)
Basophils Relative: 1 %
Eosinophils Absolute: 0.2 10*3/uL (ref 0.0–0.5)
Eosinophils Relative: 2 %
HCT: 37.3 % (ref 36.0–46.0)
Hemoglobin: 12.4 g/dL (ref 12.0–15.0)
Immature Granulocytes: 0 %
Lymphocytes Relative: 21 %
Lymphs Abs: 2.2 10*3/uL (ref 0.7–4.0)
MCH: 29.1 pg (ref 26.0–34.0)
MCHC: 33.2 g/dL (ref 30.0–36.0)
MCV: 87.6 fL (ref 80.0–100.0)
Monocytes Absolute: 1.1 10*3/uL — ABNORMAL HIGH (ref 0.1–1.0)
Monocytes Relative: 11 %
Neutro Abs: 6.8 10*3/uL (ref 1.7–7.7)
Neutrophils Relative %: 65 %
Platelets: 272 10*3/uL (ref 150–400)
RBC: 4.26 MIL/uL (ref 3.87–5.11)
RDW: 12.8 % (ref 11.5–15.5)
WBC: 10.5 10*3/uL (ref 4.0–10.5)
nRBC: 0 % (ref 0.0–0.2)

## 2024-01-03 LAB — COMPREHENSIVE METABOLIC PANEL
ALT: 28 U/L (ref 0–44)
AST: 26 U/L (ref 15–41)
Albumin: 3.8 g/dL (ref 3.5–5.0)
Alkaline Phosphatase: 72 U/L (ref 38–126)
Anion gap: 10 (ref 5–15)
BUN: 26 mg/dL — ABNORMAL HIGH (ref 8–23)
CO2: 25 mmol/L (ref 22–32)
Calcium: 9 mg/dL (ref 8.9–10.3)
Chloride: 105 mmol/L (ref 98–111)
Creatinine, Ser: 0.73 mg/dL (ref 0.44–1.00)
GFR, Estimated: >60 mL/min (ref >60)
Glucose, Bld: 107 mg/dL — ABNORMAL HIGH (ref 70–99)
Potassium: 3.8 mmol/L (ref 3.5–5.1)
Sodium: 140 mmol/L (ref 135–145)
Total Bilirubin: 0.6 mg/dL (ref 0.0–1.2)
Total Protein: 6.5 g/dL (ref 6.5–8.1)

## 2024-01-03 LAB — GLUCOSE, CAPILLARY
Glucose-Capillary: 200 mg/dL — ABNORMAL HIGH (ref 70–99)
Glucose-Capillary: 189 mg/dL — ABNORMAL HIGH (ref 70–99)
Glucose-Capillary: 73 mg/dL (ref 70–99)
Glucose-Capillary: 186 mg/dL — ABNORMAL HIGH (ref 70–99)

## 2024-01-03 MED ORDER — DONEPEZIL HCL 5 MG PO TABS
5.0000 mg | ORAL_TABLET | Freq: Every day | ORAL | Status: DC
  Administered 2024-01-03 – 2024-01-10 (×8): 5 mg via ORAL
  Filled 2024-01-03 (×8): qty 1

## 2024-01-03 NOTE — Plan of Care (Signed)
D: Pt alert and oriented. Pt reports experiencing anxiety however denies experiencing any depression at this time. Pt denies experiencing any pain at this time. Pt denies/reports experiencing any SI/HI, or AVH at this time.   A: Scheduled medications administered to pt, per MD orders. Support and encouragement provided. Frequent verbal contact made. Routine safety checks conducted q15 minutes.   R: No adverse drug reactions noted. Pt verbally contracts for safety at this time. Pt compliant with medications and treatment plan. Pt interacts well with others on the unit. Pt remains safe at this time. Plan of care ongoing.  Pt did not receive amlodipine or hydrochlorothiazide due to low BP, MD notified and aware.  Problem: Education: Goal: Emotional status will improve Outcome: Not Progressing Goal: Mental status will improve Outcome: Not Progressing

## 2024-01-03 NOTE — Group Note (Signed)
Recreation Therapy Group Note   Group Topic:Self-Esteem  Group Date: 01/03/2024 Start Time: 1400 End Time: 1500 Facilitators: Rosina Lowenstein, LRT, CTRS Location:  Dayroom  Group Description: Positive Affirmation Worksheet. Patients and LRT discussed the importance of self-love/self-esteem and things that cause it to fluctuate, including our mental health. Pt completed a worksheet that helps them identify 24 different strengths and qualities about themselves. Pt encouraged to read aloud at least 2 off their sheet to the group. LRT and pts discussed how this can be applied to daily life post-discharge. LRT and patients played positive affirmation bingo afterwards.  Goal Area(s) Addressed: Patient will identify positive qualities about themselves. Patient will learn new positive affirmations.  Patient will recite positive qualities and affirmations aloud to the group.  Patient will practice positive self-talk.  Patient will increase communication.   Affect/Mood: Appropriate and Labile   Participation Level: Active   Participation Quality: Independent   Behavior: Cooperative   Speech/Thought Process: Loose association   Insight: Fair   Judgement: Fair    Modes of Intervention: Education, Exploration, Guided Discussion, Support, and Worksheet   Patient Response to Interventions:  Receptive   Education Outcome:  Acknowledges education   Clinical Observations/Individualized Feedback: Chiyeko was mostly active in their participation of session activities and group discussion. Pt identified "I feel good about myself when I am helping others. People I look up to are those I use to work with and are from [place]". Pt interacted well with LRT and peers while in group. Pt was pulled for lab work and did not play bingo with the group.    Plan: Continue to engage patient in RT group sessions 2-3x/week.   Rosina Lowenstein, LRT, CTRS 01/03/2024 4:53 PM
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
## 2024-01-03 NOTE — Plan of Care (Signed)
  Problem: Education: Goal: Ability to state activities that reduce stress will improve Outcome: Progressing   Problem: Education: Goal: Emotional status will improve Outcome: Progressing Goal: Mental status will improve Outcome: Progressing   Problem: Activity: Goal: Interest or engagement in activities will improve Outcome: Progressing Goal: Sleeping patterns will improve Outcome: Progressing   Problem: Coping: Goal: Ability to demonstrate self-control will improve Outcome: Progressing   Problem: Safety: Goal: Periods of time without injury will increase Outcome: Progressing

## 2024-01-03 NOTE — BHH Counselor (Signed)
CSW contacted [facility] guardianship at [phone].   CSW unable to reach, left HIPAA Compliant VM requesting return call.   Reynaldo Minium, MSW, [place] 01/03/2024 1:54 PM

## 2024-01-03 NOTE — Progress Notes (Signed)
   01/03/24 0600  15 Minute Checks  Location Bedroom  Visual Appearance Calm  Behavior Composed  Sleep (Behavioral Health Patients Only)  Calculate sleep? (Click Yes once per 24 hr at 0600 safety check) Yes  Documented sleep last 24 hours 6.25

## 2024-01-03 NOTE — Progress Notes (Signed)
   01/02/24 1500  PPD Results  Does patient have an induration at the injection site? No  Induration(mm) 0 mm  Name of Physician Notified Dr. Irwin Brakeman

## 2024-01-03 NOTE — BHH Counselor (Signed)
CSW sent updated  FL2 to Denita at [facility].   Denita reports that pt's guardian is a [employer].   CSW will contact APS for clarity on exactly what company was appointed.   Reynaldo Minium, MSW, [place] 01/03/2024 1:52 PM
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
## 2024-01-03 NOTE — Group Note (Signed)
Date:  01/03/2024 Time:  8:36 PM  Group Topic/Focus:  Goals Group:   The focus of this group is to help patients establish daily goals to achieve during treatment and discuss how the patient can incorporate goal setting into their daily lives to aide in recovery.    Participation Level:  Active  Participation Quality:  Appropriate  Affect:  Appropriate  Cognitive:  Appropriate  Insight: Appropriate  Engagement in Group:  Engaged  Modes of Intervention:  Discussion  Additional Comments:    Burt Ek 01/03/2024, 8:36 PM

## 2024-01-04 DIAGNOSIS — F333 Major depressive disorder, recurrent, severe with psychotic symptoms: Secondary | ICD-10-CM | POA: Diagnosis not present

## 2024-01-04 LAB — GLUCOSE, CAPILLARY
Glucose-Capillary: 171 mg/dL — ABNORMAL HIGH (ref 70–99)
Glucose-Capillary: 75 mg/dL (ref 70–99)
Glucose-Capillary: 71 mg/dL (ref 70–99)
Glucose-Capillary: 115 mg/dL — ABNORMAL HIGH (ref 70–99)
Glucose-Capillary: 66 mg/dL — ABNORMAL LOW (ref 70–99)

## 2024-01-04 NOTE — Plan of Care (Signed)
   Problem: Education: Goal: Emotional status will improve Outcome: Progressing Goal: Mental status will improve Outcome: Not Progressing

## 2024-01-04 NOTE — Group Note (Signed)
Recreation Therapy Group Note   Group Topic:General Recreation  Group Date: 01/04/2024 Start Time: 1330 End Time: 1415 Facilitators: Rosina Lowenstein, LRT, CTRS Location: Courtyard  Group Description: Leisure. Patients were given the opportunity to exercise, play cards, listen to music, or go outside to the courtyard. Collectively, pts chose to go outside to the courtyard to get fresh air and sunlight. Pt identified and conversated about things they enjoy doing in their free time and how they can continue to do that outside of the hospital.   Goal Area(s) Addressed:   Patient will practice making a positive decision.   Patient will have the opportunity to try a new leisure activity.   Patient will communicate with peers and LRT.    Affect/Mood: Appropriate   Participation Level: Hyperverbal   Participation Quality: Independent   Behavior: Alert and Appropriate   Speech/Thought Process: Flight of ideas and Loose association   Insight: Limited   Judgement: Limited   Modes of Intervention: Music, Open Conversation, Rapport Building, and Socialization   Patient Response to Interventions:  Receptive   Education Outcome:  Acknowledges education   Clinical Observations/Individualized Feedback: Natasha Chandler was active in their participation of session activities and group discussion. Pt was very talkative and happy today during group. Pt shared: "I am not mental, but I am goofy. I like to make people laugh because people are either laughing or bitching". Pt was very pleasant and interactive with LRT and peers duration of session.   Plan: Continue to engage patient in RT group sessions 2-3x/week.   Rosina Lowenstein, LRT, CTRS 01/04/2024 4:19 PM
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
## 2024-01-04 NOTE — Group Note (Signed)
Date:  01/04/2024 Time:  9:19 PM  Group Topic/Focus:  Wrap-Up Group:   The focus of this group is to help patients review their daily goal of treatment and discuss progress on daily workbooks.    Participation Level:  Active  Participation Quality:  Appropriate  Affect:  Appropriate  Cognitive:  Appropriate  Insight: Appropriate  Engagement in Group:  Engaged  Modes of Intervention:  Discussion    Lenore Cordia 01/04/2024, 9:19 PM

## 2024-01-04 NOTE — Progress Notes (Signed)
Assumed care of patient this pm, she presented A&O x 3, affect & mood sad, depressed and preoccupied. Pt was confused and disorganized at times, sociable with peers and attended group. Pt denied thoughts, plan or intent to harm self or others and made a safety commitment, Pt compliant with taking medications and following treatment goal Pt educated on plan of care, medication regimen and she acknowledged an understanding and was without further complaints or concerns.  Pt is maintained on q 15 min rounds for safety ans support.

## 2024-01-04 NOTE — Progress Notes (Signed)
   01/04/24 1700  Psych Admission Type (Psych Patients Only)  Admission Status Involuntary  Psychosocial Assessment  Patient Complaints Worrying  Eye Contact Fair  Facial Expression Animated  Affect Anxious  Speech Logical/coherent  Interaction Assertive  Motor Activity Other (Comment) (WNL)  Appearance/Hygiene Unremarkable  Behavior Characteristics Pacing  Mood Preoccupied  Thought Process  Coherency WDL  Content Blaming others  Delusions None reported or observed  Perception WDL  Hallucination None reported or observed  Judgment Impaired  Confusion Moderate  Danger to Self  Current suicidal ideation? Denies  Agreement Not to Harm Self Yes  Description of Agreement verbal  Danger to Others  Danger to Others None reported or observed

## 2024-01-04 NOTE — [facility] Counselor (Signed)
CSW submitted updated FL2 to [facility] per their request.    Reynaldo Minium, MSW, [facility] 01/04/2024 9:18 PM

## 2024-01-05 DIAGNOSIS — F333 Major depressive disorder, recurrent, severe with psychotic symptoms: Secondary | ICD-10-CM | POA: Diagnosis not present

## 2024-01-05 LAB — GLUCOSE, CAPILLARY
Glucose-Capillary: 191 mg/dL — ABNORMAL HIGH (ref 70–99)
Glucose-Capillary: 112 mg/dL — ABNORMAL HIGH (ref 70–99)
Glucose-Capillary: 219 mg/dL — ABNORMAL HIGH (ref 70–99)
Glucose-Capillary: 107 mg/dL — ABNORMAL HIGH (ref 70–99)
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
## 2024-01-05 NOTE — BH IP Treatment Plan (Signed)
Interdisciplinary Treatment and Diagnostic Plan Update  01/05/2024 Time of Session: 2:43pm Natasha Chandler MRN: [id number]  Principal Diagnosis: MDD (major depressive disorder), recurrent, severe, with psychosis (HCC)  Secondary Diagnoses: Principal Problem:   MDD (major depressive disorder), recurrent, severe, with psychosis (HCC)   Current Medications:  Current Facility-Administered Medications  Medication Dose Route Frequency Provider Last Rate Last Admin   acetaminophen (TYLENOL) tablet 650 mg  650 mg Oral Q6H PRN Lewanda Rife, MD   650 mg at 01/03/24 0938   albuterol (PROVENTIL) (2.5 MG/3ML) 0.083% nebulizer solution 3 mL  3 mL Inhalation Q6H PRN Lamar Sprinkles, MD       alum & mag hydroxide-simeth (MAALOX/MYLANTA) 200-200-20 MG/5ML suspension 30 mL  30 mL Oral Q4H PRN Lamar Sprinkles, MD       amLODipine (NORVASC) tablet 10 mg  10 mg Oral Daily Lamar Sprinkles, MD   10 mg at 01/05/24 1026   aspirin EC tablet 81 mg  81 mg Oral Daily Sarina Ill, DO   81 mg at 01/05/24 1027   donepezil (ARICEPT) tablet 5 mg  5 mg Oral QHS Verner Chol, MD   5 mg at 01/04/24 2131   fluticasone (FLONASE) 50 MCG/ACT nasal spray 2 spray  2 spray Each Nare Daily PRN Lamar Sprinkles, MD       hydrochlorothiazide (HYDRODIURIL) tablet 12.5 mg  12.5 mg Oral Daily Sarina Ill, DO   12.5 mg at 01/05/24 1026   insulin aspart (novoLOG) injection 0-5 Units  0-5 Units Subcutaneous QHS Sarina Ill, DO   3 Units at 01/01/24 2117   insulin aspart (novoLOG) injection 0-9 Units  0-9 Units Subcutaneous TID WC Sarina Ill, DO   2 Units at 01/05/24 0847   insulin aspart (novoLOG) injection 8 Units  8 Units Subcutaneous TID WC Sarina Ill, DO   8 Units at 01/05/24 1214   levothyroxine (SYNTHROID) tablet 50 mcg  50 mcg Oral Q0600 Sarina Ill, DO   50 mcg at 01/05/24 4098   lip balm (BLISTEX) ointment 1 Application  1 Application Topical PRN Rockwell Alexandria, RPH       loperamide (IMODIUM) capsule 2 mg  2 mg Oral PRN Lewanda Rife, MD   2 mg at 12/13/23 1254   loratadine (CLARITIN) tablet 10 mg  10 mg Oral Daily PRN Lamar Sprinkles, MD       losartan (COZAAR) tablet 100 mg  100 mg Oral Daily Lamar Sprinkles, MD   100 mg at 01/05/24 1027   magnesium hydroxide (MILK OF MAGNESIA) suspension 30 mL  30 mL Oral Daily PRN Lamar Sprinkles, MD       menthol-cetylpyridinium (CEPACOL) lozenge 3 mg  1 lozenge Oral PRN Dixon, Elray Buba, NP       mometasone-formoterol (DULERA) 200-5 MCG/ACT inhaler 2 puff  2 puff Inhalation BID Lamar Sprinkles, MD   2 puff at 01/05/24 1026   OLANZapine zydis (ZYPREXA) disintegrating tablet 5 mg  5 mg Oral TID PRN Lamar Sprinkles, MD   5 mg at 12/30/23 1255   ondansetron (ZOFRAN) tablet 4 mg  4 mg Oral Q6H PRN Lamar Sprinkles, MD       Or   ondansetron (ZOFRAN) injection 4 mg  4 mg Intravenous Q6H PRN Lamar Sprinkles, MD       QUEtiapine (SEROQUEL) tablet 50 mg  50 mg Oral QHS Verner Chol, MD   50 mg at 01/04/24 2130   rosuvastatin (CRESTOR) tablet 20 mg  20 mg Oral Daily  Lamar Sprinkles, MD   20 mg at 01/05/24 1027   senna-docusate (Senokot-S) tablet 1 tablet  1 tablet Oral QHS PRN Lamar Sprinkles, MD       sertraline (ZOLOFT) tablet 100 mg  100 mg Oral Daily Lamar Sprinkles, MD   100 mg at 01/05/24 1027   traZODone (DESYREL) tablet 50 mg  50 mg Oral QHS PRN Jearld Lesch, NP   50 mg at 01/04/24 2130   PTA Medications: Medications Prior to Admission  Medication Sig Dispense Refill Last Dose/Taking   albuterol (PROVENTIL HFA;VENTOLIN HFA) 108 (90 Base) MCG/ACT inhaler Inhale 1-2 puffs into the lungs every 6 (six) hours as needed for wheezing or shortness of breath.       amLODipine (NORVASC) 10 MG tablet Take 10 mg by mouth in the morning.      aspirin 81 MG chewable tablet Chew 1 tablet (81 mg total) by mouth daily.      diclofenac (VOLTAREN) 50 MG EC tablet Take 50 mg by mouth 2 (two) times daily.       donepezil (ARICEPT) 5 MG tablet Take 5 mg by mouth at bedtime.      fluticasone (FLONASE) 50 MCG/ACT nasal spray Place 2 sprays into both nostrils daily as needed for allergies.      hydrochlorothiazide (HYDRODIURIL) 12.5 MG tablet Take 12.5 mg by mouth daily.      Insulin Aspart FlexPen (NOVOLOG) 100 UNIT/ML Inject 3 Units into the skin 3 (three) times daily with meals.      insulin degludec (TRESIBA) 100 UNIT/ML FlexTouch Pen Inject 10 Units into the skin daily.      Insulin Pen Needle 31G X 5 MM MISC Use to inject insulin once daily 30 each 5    levothyroxine (SYNTHROID) 50 MCG tablet Take 50 mcg by mouth daily before breakfast.      lip balm (CARMEX) ointment Apply topically as needed.      loratadine (CLARITIN) 10 MG tablet Take 1 tablet (10 mg total) by mouth daily as needed for allergies.      losartan (COZAAR) 100 MG tablet Take 1 tablet (100 mg total) by mouth daily. (Patient taking differently: Take 100 mg by mouth at bedtime.)      pioglitazone (ACTOS) 45 MG tablet Take 45 mg by mouth daily.      risperiDONE (RISPERDAL M-TABS) 0.5 MG disintegrating tablet Take 1 tablet (0.5 mg total) by mouth at bedtime.      rosuvastatin (CRESTOR) 20 MG tablet Take 20 mg by mouth at bedtime.      Semaglutide, 2 MG/DOSE, (OZEMPIC, 2 MG/DOSE,) 8 MG/3ML SOPN Inject 0.75 mLs (2 mg total) into the skin every 7 days. 9 mL 1    sertraline (ZOLOFT) 100 MG tablet Take 1 tablet (100 mg total) by mouth daily.      Vitamin D, Ergocalciferol, (DRISDOL) 1.25 MG (50000 UNIT) CAPS capsule Take 50,000 Units by mouth every Sunday. Thursdays       Patient Stressors: Health problems   Marital or family conflict    Patient Strengths: Ability for insight  Capable of independent living  Motivation for treatment/growth  Supportive family/friends   Treatment Modalities: Medication Management, Group therapy, Case management,  1 to 1 session with clinician, Psychoeducation, Recreational therapy.   Physician Treatment  Plan for Primary Diagnosis: MDD (major depressive disorder), recurrent, severe, with psychosis (HCC) Long Term Goal(s): Improvement in symptoms so as ready for discharge   Short Term Goals: Ability to identify changes in lifestyle to  reduce recurrence of condition will improve Ability to verbalize feelings will improve Ability to disclose and discuss suicidal ideas Ability to demonstrate self-control will improve Ability to identify and develop effective coping behaviors will improve Ability to maintain clinical measurements within normal limits will improve Compliance with prescribed medications will improve Ability to identify triggers associated with substance abuse/mental health issues will improve  Medication Management: Evaluate patient's response, side effects, and tolerance of medication regimen.  Therapeutic Interventions: 1 to 1 sessions, Unit Group sessions and Medication administration.  Evaluation of Outcomes: Progressing  Physician Treatment Plan for Secondary Diagnosis: Principal Problem:   MDD (major depressive disorder), recurrent, severe, with psychosis (HCC)  Long Term Goal(s): Improvement in symptoms so as ready for discharge   Short Term Goals: Ability to identify changes in lifestyle to reduce recurrence of condition will improve Ability to verbalize feelings will improve Ability to disclose and discuss suicidal ideas Ability to demonstrate self-control will improve Ability to identify and develop effective coping behaviors will improve Ability to maintain clinical measurements within normal limits will improve Compliance with prescribed medications will improve Ability to identify triggers associated with substance abuse/mental health issues will improve     Medication Management: Evaluate patient's response, side effects, and tolerance of medication regimen.  Therapeutic Interventions: 1 to 1 sessions, Unit Group sessions and Medication  administration.  Evaluation of Outcomes: Progressing   RN Treatment Plan for Primary Diagnosis: MDD (major depressive disorder), recurrent, severe, with psychosis (HCC) Long Term Goal(s): Knowledge of disease and therapeutic regimen to maintain health will improve  Short Term Goals: Ability to remain free from injury will improve, Ability to verbalize frustration and anger appropriately will improve, Ability to demonstrate self-control, Ability to participate in decision making will improve, Ability to verbalize feelings will improve, Ability to disclose and discuss suicidal ideas, Ability to identify and develop effective coping behaviors will improve, and Compliance with prescribed medications will improve  Medication Management: RN will administer medications as ordered by provider, will assess and evaluate patient's response and provide education to patient for prescribed medication. RN will report any adverse and/or side effects to prescribing provider.  Therapeutic Interventions: 1 on 1 counseling sessions, Psychoeducation, Medication administration, Evaluate responses to treatment, Monitor vital signs and CBGs as ordered, Perform/monitor CIWA, COWS, AIMS and Fall Risk screenings as ordered, Perform wound care treatments as ordered.  Evaluation of Outcomes: Progressing   LCSW Treatment Plan for Primary Diagnosis: MDD (major depressive disorder), recurrent, severe, with psychosis (HCC) Long Term Goal(s): Safe transition to appropriate next level of care at discharge, Engage patient in therapeutic group addressing interpersonal concerns.  Short Term Goals: Engage patient in aftercare planning with referrals and resources, Increase social support, Increase ability to appropriately verbalize feelings, Increase emotional regulation, Facilitate acceptance of mental health diagnosis and concerns, Facilitate patient progression through stages of change regarding substance use diagnoses and  concerns, Identify triggers associated with mental health/substance abuse issues, and Increase skills for wellness and recovery  Therapeutic Interventions: Assess for all discharge needs, 1 to 1 time with Social worker, Explore available resources and support systems, Assess for adequacy in community support network, Educate family and significant other(s) on suicide prevention, Complete Psychosocial Assessment, Interpersonal group therapy.  Evaluation of Outcomes: Progressing   Progress in Treatment: Attending groups: Yes. Participating in groups: Yes. Taking medication as prescribed: Yes. Toleration medication: Yes. Family/Significant other contact made: Yes, individual(s) contacted:  CSW completed with Thurston Hole Spiller/aunt [phone]) on 11/28/2023 Patient understands diagnosis: Yes. Discussing patient identified problems/goals  with staff: Yes. Medical problems stabilized or resolved: Yes. Denies suicidal/homicidal ideation: Yes. Issues/concerns per patient self-inventory: Yes. Other:   New problem(s) identified: No, Describe:  None identified 12/01/23 Update: Pt is mildly confused but pleasant. Update 12/06/23: No changes at this time. Update: 12/16/2023: No changes at this time.  Update 12/21/2023:  No changes at this time. Update 12/26/23: No changes at this time Update 12/31/23: No changes at this time. Update: 01/05/2024: No changes at this time.    New Short Term/Long Term Goal(s):elimination of symptoms of psychosis, medication management for mood stabilization; elimination of SI thoughts; development of comprehensive mental wellness plan. 12/01/23 Update: Goal to remain the same. Update 12/06/23: No changes at this time Update 12/11/23: No changes at this time.  Update: 12/16/2023: No changes at this time.  Update: 12/16/2023: No changes at this time.   Update 12/21/2023:  No changes at this time. Update 12/26/23: No changes at this time Update 12/31/23: No changes at this time. Update: 01/05/2024:  No changes at this time.       Patient Goals:  "Besides getting back to my family, getting back to my life, I have a two bedroom house" 12/01/23 Update: Pt goal to remain the same. Update 12/06/23: No changes at this time Update 12/11/23: No changes at this time.  Update: 12/16/2023: No changes at this time.   Update 12/21/2023:  No changes at this time. Update 12/26/23: No changes at this time Update 12/31/23: No changes at this time. Update: 01/05/2024: No changes at this time.       Discharge Plan or Barriers: CSW will assist with appropriate discharge planning  12/01/23 Update: Discharge plan to remain the same. Update 12/06/23: Pt's sister is getting guardianship of pt and has a court date on 12/28/23. Update 12/11/23: Pt was served guardianship paperwork. Pt has guardianship hearing on 12/28/23.  Update: 12/16/2023: No changes at this time.  Update 12/21/2023:  Patient is ready for discharge at this time.  Patient's sister and additional family are declining to pick the patient up for discharge.  They report they are working on placement, however, "that will take some time".  Update 12/26/23: Patient continues to be ready for discharge. Pt has guardianship hearing in 12/28/23. Pt will be discharged following sister taking guardianship Update 12/31/23: Pt to be assigned state appointed guardian instead of sister. Update: 01/05/2024: No changes at this time.      Reason for Continuation of Hospitalization: Depression Medication stabilization 12/01/23 Update: Depression Medication stabilization. Update 12/06/23: No changes at this time Update 12/11/23: No changes at this time .  Update: 12/16/2023: No changes at this time.   Update 12/21/2023:  No changes at this time. Update 12/26/23: No changes at this time Update 12/31/23: No changes at this time. Update: 01/05/2024: No changes at this time.       Estimated Length of Stay:1 to 7 days 12/01/23 Update: TBD Update 12/06/23: TBD Update 12/11/23: TBD.  Update: 12/16/2023: TBD    Update 12/21/2023:  TBD Update 12/26/23: 12/28/23 Update 12/31/23: TBD. Update: 01/05/2024: TBD  Last 3 [place] Suicide Severity Risk Score: Flowsheet Row Admission (Current) from 11/25/2023 in [facility] to Hosp-Admission (Discharged) from 11/16/2023 in [facility] ED from 08/09/2023 in [facility] Emergency Department at [facility]  C-SSRS RISK CATEGORY No Risk No Risk No Risk       Last PHQ 2/9 Scores:    08/13/2020    4:59 PM  08/04/2020    3:16 PM 12/15/2015    7:07 PM  Depression screen PHQ 2/9  Decreased Interest 1 1 0  Down, Depressed, Hopeless 2 2 0  PHQ - 2 Score 3 3 0  Altered sleeping 3 3   Tired, decreased energy 2 2   Change in appetite 2 2   Feeling bad or failure about yourself  1 1   Trouble concentrating 2 2   Moving slowly or fidgety/restless 2 2   Suicidal thoughts 0 0   PHQ-9 Score 15 15   Difficult doing work/chores Somewhat difficult Somewhat difficult     Scribe for Treatment Team: Rhett Bannister 01/05/2024 2:43 PM
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
## 2024-01-05 NOTE — Plan of Care (Signed)
   Problem: Health Behavior/Discharge Planning: Goal: Ability to identify and utilize available resources and services will improve Outcome: Not Progressing Goal: Ability to manage health-related needs will improve Outcome: Not Progressing

## 2024-01-05 NOTE — Group Note (Signed)
[facility] LCSW Group Therapy Note   Group Date: 01/05/2024 Start Time: 1320 End Time: 1410   Type of Therapy/Topic:  Group Therapy:  Balance in Life  Participation Level:  Active   Description of Group:    This group will address the concept of balance and how it feels and looks when one is unbalanced. Patients will be encouraged to process areas in their lives that are out of balance, and identify reasons for remaining unbalanced. Facilitators will guide patients utilizing problem- solving interventions to address and correct the stressor making their life unbalanced. Understanding and applying boundaries will be explored and addressed for obtaining  and maintaining a balanced life. Patients will be encouraged to explore ways to assertively make their unbalanced needs known to significant others in their lives, using other group members and facilitator for support and feedback.  Therapeutic Goals: Patient will identify two or more emotions or situations they have that consume much of in their lives. Patient will identify signs/triggers that life has become out of balance:  Patient will identify two ways to set boundaries in order to achieve balance in their lives:  Patient will demonstrate ability to communicate their needs through discussion and/or role plays  Summary of Patient Progress:    Patient was alert and active in group. Patient was able to describe the impact that feelings have on behaviors.      Therapeutic Modalities:   Cognitive Behavioral Therapy Solution-Focused Therapy Assertiveness Training   [facility], [address]

## 2024-01-06 DIAGNOSIS — F333 Major depressive disorder, recurrent, severe with psychotic symptoms: Secondary | ICD-10-CM | POA: Diagnosis not present

## 2024-01-06 LAB — GLUCOSE, CAPILLARY
Glucose-Capillary: 162 mg/dL — ABNORMAL HIGH (ref 70–99)
Glucose-Capillary: 219 mg/dL — ABNORMAL HIGH (ref 70–99)
Glucose-Capillary: 84 mg/dL (ref 70–99)
Glucose-Capillary: 204 mg/dL — ABNORMAL HIGH (ref 70–99)

## 2024-01-06 MED ORDER — DESMOPRESSIN ACETATE 0.1 MG PO TABS
0.0500 mg | ORAL_TABLET | Freq: Every day | ORAL | Status: DC
Start: 2024-01-06 — End: 2024-01-11
  Administered 2024-01-06 – 2024-01-10 (×5): 0.05 mg via ORAL
  Filled 2024-01-06 (×5): qty 1
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
## 2024-01-06 NOTE — Progress Notes (Signed)
 Patient pleasant.  Worried affect.  Restless and pacing in hallways.  Endorses anxiety.  Denies SI/HI and AVH.  Denies feelings of depression.  Denies pain. Poor sleep.  Compliant with scheduled mediations. 15 min checks in place for safety. Patient is  observed in the hallway walking and in the dayroom.  Appropriate interaction with peers.

## 2024-01-06 NOTE — Progress Notes (Signed)
   01/06/24 2300  Psych Admission Type (Psych Patients Only)  Admission Status Involuntary  Psychosocial Assessment  Patient Complaints Anxiety  Eye Contact Fair  Facial Expression Worried  Affect Anxious  Speech Logical/coherent  Interaction Assertive  Motor Activity Slow  Appearance/Hygiene Unremarkable  Behavior Characteristics Cooperative;Restless  Mood Preoccupied;Pleasant  Thought Process  Coherency WDL  Content Blaming self  Delusions None reported or observed  Perception WDL  Hallucination None reported or observed  Judgment Impaired  Confusion Mild  Danger to Self  Current suicidal ideation? Denies  Agreement Not to Harm Self Yes  Description of Agreement verbal  Danger to Others  Danger to Others None reported or observed

## 2024-01-06 NOTE — Plan of Care (Signed)
   Problem: Education: Goal: Emotional status will improve Outcome: Progressing Goal: Mental status will improve Outcome: Progressing   Problem: Activity: Goal: Interest or engagement in activities will improve Outcome: Progressing
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
## 2024-01-06 NOTE — Group Note (Signed)
Date:  01/06/2024 Time:  11:07 PM  Group Topic/Focus:  Wrap-Up Group:   The focus of this group is to help patients review their daily goal of treatment and discuss progress on daily workbooks.    Participation Level:  Active  Participation Quality:  Appropriate  Affect:  Appropriate  Cognitive:  Appropriate  Insight: Good  Engagement in Group:  Engaged  Modes of Intervention:  Discussion  Additional Comments:    [name] 01/06/2024, 11:07 PM

## 2024-01-07 DIAGNOSIS — F333 Major depressive disorder, recurrent, severe with psychotic symptoms: Secondary | ICD-10-CM | POA: Diagnosis not present

## 2024-01-07 LAB — GLUCOSE, CAPILLARY
Glucose-Capillary: 158 mg/dL — ABNORMAL HIGH (ref 70–99)
Glucose-Capillary: 188 mg/dL — ABNORMAL HIGH (ref 70–99)
Glucose-Capillary: 117 mg/dL — ABNORMAL HIGH (ref 70–99)
Glucose-Capillary: 122 mg/dL — ABNORMAL HIGH (ref 70–99)

## 2024-01-07 NOTE — Group Note (Signed)
Recreation Therapy Group Note   Group Topic:General Recreation  Group Date: 01/07/2024 Start Time: 1500 End Time: 1600 Facilitators: Rosina Lowenstein, LRT, CTRS Location: Courtyard  Group Description: Outdoor Recreation. Patients had the option to play corn hole, ring toss, bowling or listening to music while outside in the courtyard getting fresh air and sunlight. LRT and patients discussed things that they enjoy doing in their free time outside of the hospital. LRT encouraged patients to drink water after being active and getting their heart rate up.   Goal Area(s) Addressed: Patient will identify leisure interests.  Patient will practice healthy decision making. Patient will engage in recreation activity.   Affect/Mood: Appropriate   Participation Level: Active   Participation Quality: Independent   Behavior: Appropriate   Speech/Thought Process: Loose association   Insight: Fair   Judgement: Fair    Modes of Intervention: Activity   Patient Response to Interventions:  Receptive   Education Outcome:  Acknowledges education   Clinical Observations/Individualized Feedback: Katyana was active in their participation of session activities and group discussion. Pt chose to play cornhole while outside with peers. Pt had a bright affect and was pleasant. Pt interacted well with LRT and peers duration of session.    Plan: Continue to engage patient in RT group sessions 2-3x/week.   [address] LRT, CTRS 01/07/2024 5:02 PM

## 2024-01-07 NOTE — Progress Notes (Signed)
   01/07/24 0603  15 Minute Checks  Location Bedroom  Visual Appearance Calm  Behavior Sleeping  Sleep (Behavioral Health Patients Only)  Calculate sleep? (Click Yes once per 24 hr at 0600 safety check) Yes  Documented sleep last 24 hours 9

## 2024-01-07 NOTE — Plan of Care (Signed)
  Problem: Education: Goal: Emotional status will improve Outcome: Progressing   Problem: Education: Goal: Mental status will improve 01/07/2024 1417 by Arlee Muslim, RN Outcome: Progressing 01/07/2024 1417 by Arlee Muslim, RN Outcome: Progressing   Problem: Activity: Goal: Interest or engagement in activities will improve Outcome: Progressing

## 2024-01-07 NOTE — Progress Notes (Signed)
D: Pt alert and oriented. Pt denies experiencing any anxiety/depression at this time. Pt denies experiencing any pain at this time. Pt denies experiencing any SI/HI, or AVH at this time. Adequate w/ meal intake and hygiene care.      A: Scheduled medications administered to pt, per MD orders. Support and encouragement provided. Frequent verbal contact made. Routine safety checks conducted q15 minutes.   R: No adverse drug reactions noted. Pt verbally contracts for safety at this time. Pt complaint with medications. Pt interacts appropriately with others on the unit. Pt remains safe at this time. Plan of care ongoing.
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
## 2024-01-07 NOTE — Group Note (Signed)
Date:  01/07/2024 Time:  6:17 PM  Group Topic/Focus:  Spirituality:   The focus of this group is to discuss how one's spirituality can aide in recovery.    Participation Level:  Active  Participation Quality:  Appropriate, Attentive, Sharing, and Supportive  Affect:  Appropriate  Cognitive:  Alert, Appropriate, and Oriented  Insight: Appropriate and Improving  Engagement in Group:  Engaged and Supportive  Modes of Intervention:  Socialization  Additional Comments:     Alexis Frock 01/07/2024, 6:17 PM

## 2024-01-07 NOTE — [facility] Counselor (Signed)
CSW contacted [facility] to make sure they received FL2 that was sent on Friday, 01/31.   CSW awaiting update on this.    Reynaldo Minium, MSW, [place] 01/07/2024 1:36 PM

## 2024-01-07 NOTE — Group Note (Signed)
Recreation Therapy Group Note   Group Topic:Coping Skills  Group Date: 01/07/2024 Start Time: 1100 End Time: 1145 Facilitators: Rosina Lowenstein, LRT, CTRS Location:  Dayroom  Group Description: Mind Map.  Patient was provided a blank template of a diagram with 32 blank boxes in a tiered system, branching from the center (similar to a bubble chart). LRT directed patients to label the middle of the diagram "Coping Skills". LRT and patients then came up with 8 different coping skills as examples. Pt were directed to record their coping skills in the 2nd tier boxes closest to the center.  Patients would then share their coping skills with the group as LRT wrote them out. LRT gave a handout of 99 different coping skills at the end of group.   Goal Area(s) Addressed: Patients will be able to define "coping skills". Patient will identify new coping skills.  Patient will increase communication.   Affect/Mood: Appropriate and Irritable   Participation Level: Active and Engaged   Participation Quality: Independent   Behavior: Cooperative   Speech/Thought Process: Coherent   Insight: Good   Judgement: Good   Modes of Intervention: Clarification, Education, Guided Discussion, Open Conversation, and Worksheet   Patient Response to Interventions:  Attentive and Receptive   Education Outcome:  Acknowledges education   Clinical Observations/Individualized Feedback: Natasha Chandler was active in their participation of session activities and group discussion. Pt identified "scream into a pillow,  take a time out, and watch movies" as coping skills. Pt was getting noticeable irritated at a peer who would not stop talking. Pt took it upon herself to move tables and create space between her and the peer. Pt appropriately completed the worksheet.   Plan: Continue to engage patient in RT group sessions 2-3x/week.   Rosina Lowenstein, LRT, CTRS 01/07/2024 12:35 PM

## 2024-01-07 NOTE — Progress Notes (Signed)
   01/07/24 2100  Psych Admission Type (Psych Patients Only)  Admission Status Involuntary  Psychosocial Assessment  Patient Complaints Anxiety  Eye Contact Fair  Facial Expression Fixed smile  Affect Anxious  Speech Logical/coherent  Interaction Assertive  Motor Activity Slow  Appearance/Hygiene Unremarkable  Behavior Characteristics Cooperative;Anxious  Mood Anxious;Preoccupied;Pleasant  Thought Process  Coherency WDL  Content Blaming self  Delusions None reported or observed  Perception WDL  Hallucination None reported or observed  Judgment Impaired  Confusion Mild  Danger to Self  Current suicidal ideation? Denies  Agreement Not to Harm Self Yes  Description of Agreement Verbal  Danger to Others  Danger to Others None reported or observed

## 2024-01-07 NOTE — Group Note (Signed)
Date:  01/07/2024 Time:  11:53 PM  Group Topic/Focus:  Wrap-Up Group:   The focus of this group is to help patients review their daily goal of treatment and discuss progress on daily workbooks.    Participation Level:  Active  Participation Quality:  Appropriate  Affect:  Appropriate  Cognitive:  Appropriate  Insight: Good  Engagement in Group:  Engaged  Modes of Intervention:  Discussion  Additional Comments:    [name] 01/07/2024, 11:53 PM

## 2024-01-08 DIAGNOSIS — F333 Major depressive disorder, recurrent, severe with psychotic symptoms: Secondary | ICD-10-CM | POA: Diagnosis not present

## 2024-01-08 LAB — GLUCOSE, CAPILLARY
Glucose-Capillary: 172 mg/dL — ABNORMAL HIGH (ref 70–99)
Glucose-Capillary: 202 mg/dL — ABNORMAL HIGH (ref 70–99)
Glucose-Capillary: 97 mg/dL (ref 70–99)
Glucose-Capillary: 239 mg/dL — ABNORMAL HIGH (ref 70–99)

## 2024-01-08 NOTE — Group Note (Signed)
 Date:  01/08/2024 Time:  9:38 PM  Group Topic/Focus:  Wrap-Up Group:   The focus of this group is to help patients review their daily goal of treatment and discuss progress on daily workbooks.    Participation Level:  Active  Participation Quality:  Appropriate  Affect:  Appropriate  Cognitive:  Alert  Insight: Appropriate  Engagement in Group:  Engaged  Modes of Intervention:  Discussion  Additional Comments:    Natasha Chandler CHRISTELLA Bunker 01/08/2024, 9:38 PM

## 2024-01-08 NOTE — Progress Notes (Signed)
   01/08/24 0733  Psych Admission Type (Psych Patients Only)  Admission Status Involuntary  Psychosocial Assessment  Patient Complaints None  Eye Contact Brief  Facial Expression Animated  Affect Anxious  Speech Logical/coherent  Interaction Needy  Motor Activity Slow  Appearance/Hygiene Unremarkable  Behavior Characteristics Cooperative  Mood Anxious  Thought Process  Coherency WDL  Content WDL  Delusions None reported or observed  Perception WDL  Hallucination None reported or observed  Judgment Impaired  Confusion Mild  Danger to Self  Current suicidal ideation? Denies  Danger to Others  Danger to Others None reported or observed

## 2024-01-08 NOTE — Progress Notes (Addendum)
   01/08/24 2020  Psych Admission Type (Psych Patients Only)  Admission Status Involuntary  Psychosocial Assessment  Patient Complaints None  Eye Contact Fair  Facial Expression Anxious  Affect Anxious  Speech Logical/coherent  Interaction Assertive  Motor Activity Slow  Appearance/Hygiene Unremarkable  Behavior Characteristics Cooperative;Anxious  Mood Anxious;Pleasant  Thought Process  Coherency Disorganized  Content WDL  Delusions None reported or observed  Perception WDL  Hallucination None reported or observed  Judgment Impaired  Confusion Mild  Danger to Self  Current suicidal ideation? Denies  Danger to Others  Danger to Others None reported or observed   Progress note   D: Pt seen at nurse's station. Pt denies SI, HI, AVH. Pt rates pain  0/10. Pt rates anxiety  0/10 and depression  0/10. A little distressed because she can't locate her aunt's phone number. Wrote down the number again for her from her chart. Pt still pleasantly confused. Disorganized speech. No other concerns noted at this time.  A: Pt provided support and encouragement. Pt given scheduled medication as prescribed. PRNs as appropriate. Q15 min checks for safety.   R: Pt safe on the unit. Will continue to monitor.

## 2024-01-08 NOTE — Plan of Care (Signed)
  Problem: Education: Goal: Utilization of techniques to improve thought processes will improve Outcome: Progressing Goal: Knowledge of the prescribed therapeutic regimen will improve Outcome: Progressing   Problem: Education: Goal: Ability to state activities that reduce stress will improve Outcome: Progressing   Problem: Education: Goal: Knowledge of [place] [facility] Education information/materials will improve Outcome: Progressing Goal: Emotional status will improve Outcome: Progressing Goal: Mental status will improve Outcome: Progressing Goal: Verbalization of understanding the information provided will improve Outcome: Progressing   Problem: Education: Goal: Knowledge of [place] [facility] Education information/materials will improve Outcome: Progressing   Problem: Education: Goal: Knowledge of [place] [facility] Education information/materials will improve Outcome: Progressing Goal: Emotional status will improve Outcome: Progressing Goal: Mental status will improve Outcome: Progressing Goal: Verbalization of understanding the information provided will improve Outcome: Progressing   Problem: Activity: Goal: Interest or engagement in activities will improve Outcome: Progressing Goal: Sleeping patterns will improve Outcome: Progressing   Problem: Coping: Goal: Ability to verbalize frustrations and anger appropriately will improve Outcome: Progressing Goal: Ability to demonstrate self-control will improve Outcome: Progressing   Problem: Health Behavior/Discharge Planning: Goal: Identification of resources available to assist in meeting health care needs will improve Outcome: Progressing Goal: Compliance with treatment plan for underlying cause of condition will improve Outcome: Progressing   Problem: Physical Regulation: Goal: Ability to maintain clinical measurements within normal limits will improve Outcome: Progressing   Problem: Safety: Goal:  Periods of time without injury will increase Outcome: Progressing   Problem: Education: Goal: Ability to describe self-care measures that may prevent or decrease complications (Diabetes Survival Skills Education) will improve Outcome: Progressing Goal: Individualized Educational Video(s) Outcome: Progressing   Problem: Coping: Goal: Ability to adjust to condition or change in health will improve Outcome: Progressing   Problem: Fluid Volume: Goal: Ability to maintain a balanced intake and output will improve Outcome: Progressing   Problem: Health Behavior/Discharge Planning: Goal: Ability to identify and utilize available resources and services will improve Outcome: Progressing Goal: Ability to manage health-related needs will improve Outcome: Progressing   Problem: Metabolic: Goal: Ability to maintain appropriate glucose levels will improve Outcome: Progressing   Problem: Nutritional: Goal: Maintenance of adequate nutrition will improve Outcome: Progressing Goal: Progress toward achieving an optimal weight will improve Outcome: Progressing   Problem: Skin Integrity: Goal: Risk for impaired skin integrity will decrease Outcome: Progressing   Problem: Tissue Perfusion: Goal: Adequacy of tissue perfusion will improve Outcome: Progressing

## 2024-01-08 NOTE — Group Note (Signed)
 Recreation Therapy Group Note   Group Topic:Health and Wellness  Group Date: 01/08/2024 Start Time: 1100 End Time: 1130 Facilitators: Celestia Jeoffrey BRAVO, LRT, CTRS Location:  Dayroom  Group Description: Seated Exercise. LRT discussed the mental and physical benefits of exercise. LRT and group discussed how physical activity can be used as a coping skill. Pt's and LRT followed along to an exercise video on the TV screen that provided a visual representation and audio description of every exercise performed. Pt's encouraged to listen to their bodies and stop at any time if they experience feelings of discomfort or pain. Pts were encouraged to drink water and stay hydrated.   Goal Area(s) Addressed: Patient will learn benefits of physical activity. Patient will identify exercise as a coping skill.  Patient will follow multistep directions. Patient will try a new leisure interest.    Affect/Mood: Appropriate   Participation Level: Active and Engaged   Participation Quality: Independent   Behavior: Appropriate and Cooperative   Speech/Thought Process: Loose association   Insight: Fair   Judgement: Good   Modes of Intervention: Activity, Education, and Exploration   Patient Response to Interventions:  Attentive, Engaged, Interested , and Receptive   Education Outcome:  Acknowledges education   Clinical Observations/Individualized Feedback: Natasha Chandler was active in their participation of session activities and group discussion. Pt completed all exercises appropriately. Pt interacted well with LRT and peers duration of session.    Plan: Continue to engage patient in RT group sessions 2-3x/week.   Jeoffrey BRAVO Celestia, LRT, CTRS 01/08/2024 12:46 PM
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
## 2024-01-08 NOTE — Group Note (Signed)
 Recreation Therapy Group Note   Group Topic:Other  Group Date: 01/08/2024 Start Time: 1430 End Time: 1545 Facilitators: Celestia Jeoffrey BRAVO, LRT, CTRS Location: Courtyard  Group Description: Outdoor Recreation. Patients had the option to play corn hole, ring toss, bowling or listening to music while outside in the courtyard getting fresh air and sunlight. LRT and patients discussed things that they enjoy doing in their free time outside of the hospital. LRT encouraged patients to drink water after being active and getting their heart rate up.   Goal Area(s) Addressed: Patient will identify leisure interests.  Patient will practice healthy decision making. Patient will engage in recreation activity.   Affect/Mood: Appropriate   Participation Level: Active and Engaged   Participation Quality: Independent   Behavior: Appropriate, Calm, and Cooperative   Speech/Thought Process: Loose association   Insight: Good   Judgement: Good   Modes of Intervention: Activity and Socialization   Patient Response to Interventions:  Attentive and Receptive   Education Outcome:  Acknowledges education   Clinical Observations/Individualized Feedback: Natasha Chandler was active in their participation of session activities and group discussion. Pt requested to hear songs by the [facility] while outside. Pt played cornhole with peers. Pt interacted well with LRT and peers while outside in group.    Plan: Continue to engage patient in RT group sessions 2-3x/week.   [address] LRT, CTRS 01/08/2024 5:25 PM

## 2024-01-08 NOTE — Progress Notes (Signed)
   01/08/24 4098  15 Minute Checks  Location Bedroom  Visual Appearance Calm  Behavior Sleeping  Sleep (Behavioral Health Patients Only)  Calculate sleep? (Click Yes once per 24 hr at 0600 safety check) Yes  Documented sleep last 24 hours 8

## 2024-01-09 DIAGNOSIS — F333 Major depressive disorder, recurrent, severe with psychotic symptoms: Secondary | ICD-10-CM | POA: Diagnosis not present

## 2024-01-09 LAB — GLUCOSE, CAPILLARY
Glucose-Capillary: 160 mg/dL — ABNORMAL HIGH (ref 70–99)
Glucose-Capillary: 127 mg/dL — ABNORMAL HIGH (ref 70–99)
Glucose-Capillary: 104 mg/dL — ABNORMAL HIGH (ref 70–99)
Glucose-Capillary: 181 mg/dL — ABNORMAL HIGH (ref 70–99)

## 2024-01-09 NOTE — Progress Notes (Signed)
   01/09/24 1323  Psych Admission Type (Psych Patients Only)  Admission Status Involuntary  Psychosocial Assessment  Patient Complaints None  Eye Contact Fair  Facial Expression Anxious  Affect Anxious  Speech Logical/coherent  Interaction Assertive  Motor Activity Slow  Appearance/Hygiene Unremarkable  Behavior Characteristics Cooperative;Calm;Anxious  Mood Anxious  Thought Process  Coherency Disorganized  Content Preoccupation  Delusions None reported or observed  Perception WDL  Hallucination None reported or observed  Judgment Impaired  Confusion Mild  Danger to Self  Current suicidal ideation? Denies  Agreement Not to Harm Self Yes  Danger to Others  Danger to Others None reported or observed

## 2024-01-09 NOTE — Plan of Care (Signed)
  Problem: Education: Goal: Ability to state activities that reduce stress will improve Outcome: Progressing   Problem: Education: Goal: Emotional status will improve Outcome: Progressing Goal: Mental status will improve Outcome: Progressing   Problem: Coping: Goal: Ability to demonstrate self-control will improve Outcome: Progressing   Problem: Safety: Goal: Periods of time without injury will increase Outcome: Progressing   Problem: Coping: Goal: Ability to adjust to condition or change in health will improve Outcome: Progressing
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
## 2024-01-09 NOTE — Group Note (Signed)
 Date:  01/09/2024 Time:  8:58 PM  Group Topic/Focus:  Self Care:   The focus of this group is to help patients understand the importance of self-care in order to improve or restore emotional, physical, spiritual, interpersonal, and financial health.    Participation Level:  Active  Participation Quality:  Appropriate  Affect:  Appropriate  Cognitive:  Appropriate  Insight: Appropriate  Engagement in Group:  Engaged  Modes of Intervention:  Education  Additional Comments:    Laymon ONEIDA Finder 01/09/2024, 8:58 PM

## 2024-01-09 NOTE — Progress Notes (Signed)
   01/09/24 0600  15 Minute Checks  Location Bedroom  Visual Appearance Calm  Behavior Sleeping  Sleep (Behavioral Health Patients Only)  Calculate sleep? (Click Yes once per 24 hr at 0600 safety check) Yes  Documented sleep last 24 hours 7.75

## 2024-01-10 DIAGNOSIS — F333 Major depressive disorder, recurrent, severe with psychotic symptoms: Secondary | ICD-10-CM | POA: Diagnosis not present

## 2024-01-10 LAB — GLUCOSE, CAPILLARY
Glucose-Capillary: 160 mg/dL — ABNORMAL HIGH (ref 70–99)
Glucose-Capillary: 185 mg/dL — ABNORMAL HIGH (ref 70–99)
Glucose-Capillary: 146 mg/dL — ABNORMAL HIGH (ref 70–99)
Glucose-Capillary: 95 mg/dL (ref 70–99)

## 2024-01-10 NOTE — Group Note (Signed)
 Date:  01/10/2024 Time:  8:54 PM  Group Topic/Focus:  Identifying Needs:   The focus of this group is to help patients identify their personal needs that have been historically problematic and identify healthy behaviors to address their needs.    Participation Level:  Active  Participation Quality:  Appropriate  Affect:  Appropriate  Cognitive:  Appropriate  Insight: Appropriate  Engagement in Group:  Engaged  Modes of Intervention:  Discussion  Additional Comments:    Laymon ONEIDA Finder 01/10/2024, 8:54 PM

## 2024-01-10 NOTE — Plan of Care (Signed)
  Problem: Education: Goal: Utilization of techniques to improve thought processes will improve Outcome: Progressing Goal: Knowledge of the prescribed therapeutic regimen will improve Outcome: Progressing   Problem: Education: Goal: Ability to state activities that reduce stress will improve Outcome: Progressing

## 2024-01-10 NOTE — BH IP Treatment Plan (Signed)
 Interdisciplinary Treatment and Diagnostic Plan Update  01/10/2024 Time of Session: 9:30 AM  Natasha Chandler MRN: [id number]  Principal Diagnosis: MDD (major depressive disorder), recurrent, severe, with psychosis (HCC)  Secondary Diagnoses: Principal Problem:   MDD (major depressive disorder), recurrent, severe, with psychosis (HCC)   Current Medications:  Current Facility-Administered Medications  Medication Dose Route Frequency Provider Last Rate Last Admin   acetaminophen  (TYLENOL ) tablet 650 mg  650 mg Oral Q6H PRN Victoria Ruts, MD   650 mg at 01/03/24 9061   albuterol  (PROVENTIL ) (2.5 MG/3ML) 0.083% nebulizer solution 3 mL  3 mL Inhalation Q6H PRN Rainelle Pfeiffer, MD       alum & mag hydroxide-simeth (MAALOX/MYLANTA) 200-200-20 MG/5ML suspension 30 mL  30 mL Oral Q4H PRN Rainelle Pfeiffer, MD       amLODipine  (NORVASC ) tablet 10 mg  10 mg Oral Daily Rainelle Pfeiffer, MD   10 mg at 01/09/24 0849   aspirin  EC tablet 81 mg  81 mg Oral Daily Cam Charlie Loving, DO   81 mg at 01/10/24 9156   desmopressin  (DDAVP ) tablet 0.05 mg  0.05 mg Oral QHS Jadapalle, Sree, MD   0.05 mg at 01/09/24 2106   donepezil  (ARICEPT ) tablet 5 mg  5 mg Oral QHS Jadapalle, Sree, MD   5 mg at 01/09/24 2107   fluticasone  (FLONASE ) 50 MCG/ACT nasal spray 2 spray  2 spray Each Nare Daily PRN Rainelle Pfeiffer, MD       hydrochlorothiazide  (HYDRODIURIL ) tablet 12.5 mg  12.5 mg Oral Daily Cam Charlie Loving, DO   12.5 mg at 01/09/24 9149   insulin  aspart (novoLOG ) injection 0-5 Units  0-5 Units Subcutaneous QHS Cam Charlie Loving, DO   2 Units at 01/08/24 2110   insulin  aspart (novoLOG ) injection 0-9 Units  0-9 Units Subcutaneous TID WC Cam Charlie Loving, DO   2 Units at 01/10/24 9160   insulin  aspart (novoLOG ) injection 8 Units  8 Units Subcutaneous TID WC Cam Charlie Loving, DO   8 Units at 01/10/24 0840   levothyroxine  (SYNTHROID ) tablet 50 mcg  50 mcg Oral Q0600 Cam Charlie Loving, DO    50 mcg at 01/10/24 9366   lip balm (BLISTEX) ointment 1 Application  1 Application Topical PRN Elesa Perkins, RPH       loperamide  (IMODIUM ) capsule 2 mg  2 mg Oral PRN Parmar, Meenakshi, MD   2 mg at 12/13/23 1254   loratadine  (CLARITIN ) tablet 10 mg  10 mg Oral Daily PRN Rainelle Pfeiffer, MD       losartan  (COZAAR ) tablet 100 mg  100 mg Oral Daily Rainelle Pfeiffer, MD   100 mg at 01/09/24 9149   magnesium  hydroxide (MILK OF MAGNESIA) suspension 30 mL  30 mL Oral Daily PRN Rainelle Pfeiffer, MD       menthol -cetylpyridinium (CEPACOL) lozenge 3 mg  1 lozenge Oral PRN Dixon, Madelene HERO, NP       mometasone -formoterol  (DULERA ) 200-5 MCG/ACT inhaler 2 puff  2 puff Inhalation BID Rainelle Pfeiffer, MD   2 puff at 01/10/24 0840   OLANZapine  zydis (ZYPREXA ) disintegrating tablet 5 mg  5 mg Oral TID PRN Rainelle Pfeiffer, MD   5 mg at 12/30/23 1255   ondansetron  (ZOFRAN ) tablet 4 mg  4 mg Oral Q6H PRN Rainelle Pfeiffer, MD       Or   ondansetron  (ZOFRAN ) injection 4 mg  4 mg Intravenous Q6H PRN Rainelle Pfeiffer, MD       QUEtiapine  (SEROQUEL ) tablet 50 mg  50 mg  Oral QHS Jadapalle, Sree, MD   50 mg at 01/09/24 2107   rosuvastatin  (CRESTOR ) tablet 20 mg  20 mg Oral Daily Rainelle Pfeiffer, MD   20 mg at 01/10/24 9156   senna-docusate (Senokot-S) tablet 1 tablet  1 tablet Oral QHS PRN Rainelle Pfeiffer, MD       sertraline  (ZOLOFT ) tablet 100 mg  100 mg Oral Daily Rainelle Pfeiffer, MD   100 mg at 01/10/24 9156   traZODone  (DESYREL ) tablet 50 mg  50 mg Oral QHS PRN Dixon, Rashaun M, NP   50 mg at 01/09/24 2107   PTA Medications: Medications Prior to Admission  Medication Sig Dispense Refill Last Dose/Taking   albuterol  (PROVENTIL  HFA;VENTOLIN  HFA) 108 (90 Base) MCG/ACT inhaler Inhale 1-2 puffs into the lungs every 6 (six) hours as needed for wheezing or shortness of breath.       amLODipine  (NORVASC ) 10 MG tablet Take 10 mg by mouth in the morning.      aspirin  81 MG chewable tablet Chew 1 tablet (81 mg total) by  mouth daily.      diclofenac  (VOLTAREN ) 50 MG EC tablet Take 50 mg by mouth 2 (two) times daily.      donepezil  (ARICEPT ) 5 MG tablet Take 5 mg by mouth at bedtime.      fluticasone  (FLONASE ) 50 MCG/ACT nasal spray Place 2 sprays into both nostrils daily as needed for allergies.      hydrochlorothiazide  (HYDRODIURIL ) 12.5 MG tablet Take 12.5 mg by mouth daily.      Insulin  Aspart FlexPen (NOVOLOG ) 100 UNIT/ML Inject 3 Units into the skin 3 (three) times daily with meals.      insulin  degludec (TRESIBA ) 100 UNIT/ML FlexTouch Pen Inject 10 Units into the skin daily.      Insulin  Pen Needle 31G X 5 MM MISC Use to inject insulin  once daily 30 each 5    levothyroxine  (SYNTHROID ) 50 MCG tablet Take 50 mcg by mouth daily before breakfast.      lip balm (CARMEX) ointment Apply topically as needed.      loratadine  (CLARITIN ) 10 MG tablet Take 1 tablet (10 mg total) by mouth daily as needed for allergies.      losartan  (COZAAR ) 100 MG tablet Take 1 tablet (100 mg total) by mouth daily. (Patient taking differently: Take 100 mg by mouth at bedtime.)      pioglitazone  (ACTOS ) 45 MG tablet Take 45 mg by mouth daily.      risperiDONE  (RISPERDAL  M-TABS) 0.5 MG disintegrating tablet Take 1 tablet (0.5 mg total) by mouth at bedtime.      rosuvastatin  (CRESTOR ) 20 MG tablet Take 20 mg by mouth at bedtime.      Semaglutide , 2 MG/DOSE, (OZEMPIC , 2 MG/DOSE,) 8 MG/3ML SOPN Inject 0.75 mLs (2 mg total) into the skin every 7 days. 9 mL 1    sertraline  (ZOLOFT ) 100 MG tablet Take 1 tablet (100 mg total) by mouth daily.      Vitamin D , Ergocalciferol , (DRISDOL) 1.25 MG (50000 UNIT) CAPS capsule Take 50,000 Units by mouth every Sunday. Thursdays       Patient Stressors: Health problems   Marital or family conflict    Patient Strengths: Ability for insight  Capable of independent living  Motivation for treatment/growth  Supportive family/friends   Treatment Modalities: Medication Management, Group therapy, Case  management,  1 to 1 session with clinician, Psychoeducation, Recreational therapy.   Physician Treatment Plan for Primary Diagnosis: MDD (major depressive disorder), recurrent, severe, with psychosis (  HCC) Long Term Goal(s): Improvement in symptoms so as ready for discharge   Short Term Goals: Ability to identify changes in lifestyle to reduce recurrence of condition will improve Ability to verbalize feelings will improve Ability to disclose and discuss suicidal ideas Ability to demonstrate self-control will improve Ability to identify and develop effective coping behaviors will improve Ability to maintain clinical measurements within normal limits will improve Compliance with prescribed medications will improve Ability to identify triggers associated with substance abuse/mental health issues will improve  Medication Management: Evaluate patient's response, side effects, and tolerance of medication regimen.  Therapeutic Interventions: 1 to 1 sessions, Unit Group sessions and Medication administration.  Evaluation of Outcomes: Adequate for Discharge  Physician Treatment Plan for Secondary Diagnosis: Principal Problem:   MDD (major depressive disorder), recurrent, severe, with psychosis (HCC)  Long Term Goal(s): Improvement in symptoms so as ready for discharge   Short Term Goals: Ability to identify changes in lifestyle to reduce recurrence of condition will improve Ability to verbalize feelings will improve Ability to disclose and discuss suicidal ideas Ability to demonstrate self-control will improve Ability to identify and develop effective coping behaviors will improve Ability to maintain clinical measurements within normal limits will improve Compliance with prescribed medications will improve Ability to identify triggers associated with substance abuse/mental health issues will improve     Medication Management: Evaluate patient's response, side effects, and tolerance of  medication regimen.  Therapeutic Interventions: 1 to 1 sessions, Unit Group sessions and Medication administration.  Evaluation of Outcomes: Adequate for Discharge   RN Treatment Plan for Primary Diagnosis: MDD (major depressive disorder), recurrent, severe, with psychosis (HCC) Long Term Goal(s): Knowledge of disease and therapeutic regimen to maintain health will improve  Short Term Goals: Ability to remain free from injury will improve, Ability to verbalize frustration and anger appropriately will improve, Ability to demonstrate self-control, Ability to participate in decision making will improve, Ability to verbalize feelings will improve, Ability to disclose and discuss suicidal ideas, Ability to identify and develop effective coping behaviors will improve, and Compliance with prescribed medications will improve  Medication Management: RN will administer medications as ordered by provider, will assess and evaluate patient's response and provide education to patient for prescribed medication. RN will report any adverse and/or side effects to prescribing provider.  Therapeutic Interventions: 1 on 1 counseling sessions, Psychoeducation, Medication administration, Evaluate responses to treatment, Monitor vital signs and CBGs as ordered, Perform/monitor CIWA, COWS, AIMS and Fall Risk screenings as ordered, Perform wound care treatments as ordered.  Evaluation of Outcomes: Adequate for Discharge   LCSW Treatment Plan for Primary Diagnosis: MDD (major depressive disorder), recurrent, severe, with psychosis (HCC) Long Term Goal(s): Safe transition to appropriate next level of care at discharge, Engage patient in therapeutic group addressing interpersonal concerns.  Short Term Goals: Engage patient in aftercare planning with referrals and resources, Increase social support, Increase ability to appropriately verbalize feelings, Increase emotional regulation, Facilitate acceptance of mental health  diagnosis and concerns, Facilitate patient progression through stages of change regarding substance use diagnoses and concerns, Identify triggers associated with mental health/substance abuse issues, and Increase skills for wellness and recovery  Therapeutic Interventions: Assess for all discharge needs, 1 to 1 time with Social worker, Explore available resources and support systems, Assess for adequacy in community support network, Educate family and significant other(s) on suicide prevention, Complete Psychosocial Assessment, Interpersonal group therapy.  Evaluation of Outcomes: Adequate for Discharge  Progress in Treatment: Attending groups: Yes. Participating in groups: Yes. Taking  medication as prescribed: Yes. Toleration medication: Yes. Family/Significant other contact made: Yes, individual(s) contacted:  CSW completed with Arlean Gatton/aunt [phone]) on 11/28/2023 Patient understands diagnosis: Yes. Discussing patient identified problems/goals with staff: Yes. Medical problems stabilized or resolved: Yes. Denies suicidal/homicidal ideation: Yes. Issues/concerns per patient self-inventory: Yes.    New problem(s) identified: No, Describe:  None identified 12/01/23 Update: Pt is mildly confused but pleasant. Update 12/06/23: No changes at this time. Update: 12/16/2023: No changes at this time.  Update 12/21/2023:  No changes at this time. Update 12/26/23: No changes at this time Update 12/31/23: No changes at this time. Update 01/05/2024: No changes at this time. Update 01/10/2024: No changes at this time.    New Short Term/Long Term Goal(s):elimination of symptoms of psychosis, medication management for mood stabilization; elimination of SI thoughts; development of comprehensive mental wellness plan. 12/01/23 Update: Goal to remain the same. Update 12/06/23: No changes at this time Update 12/11/23: No changes at this time.  Update: 12/16/2023: No changes at this time.  Update: 12/16/2023: No  changes at this time.   Update 12/21/2023:  No changes at this time. Update 12/26/23: No changes at this time Update 12/31/23: No changes at this time. Update: 01/05/2024: No changes at this time. Update 01/10/2024: No changes at this time.       Patient Goals:  Besides getting back to my family, getting back to my life, I have a two bedroom house 12/01/23 Update: Pt goal to remain the same. Update 12/06/23: No changes at this time Update 12/11/23: No changes at this time.  Update: 12/16/2023: No changes at this time.   Update 12/21/2023:  No changes at this time. Update 12/26/23: No changes at this time Update 12/31/23: No changes at this time. Update 01/05/2024: No changes at this time. Update 01/10/2024: No changes at this time.       Discharge Plan or Barriers: CSW will assist with appropriate discharge planning  12/01/23 Update: Discharge plan to remain the same. Update 12/06/23: Pt's sister is getting guardianship of pt and has a court date on 12/28/23. Update 12/11/23: Pt was served guardianship paperwork. Pt has guardianship hearing on 12/28/23.  Update: 12/16/2023: No changes at this time.  Update 12/21/2023:  Patient is ready for discharge at this time.  Patient's sister and additional family are declining to pick the patient up for discharge.  They report they are working on placement, however, that will take some time.  Update 12/26/23: Patient continues to be ready for discharge. Pt has guardianship hearing in 12/28/23. Pt will be discharged following sister taking guardianship Update 12/31/23: Pt to be assigned state appointed guardian instead of sister. Update: 01/05/2024: No changes at this time. Update 01/10/2024: Pt assigned guardian, scheduled to go to [facility]      Reason for Continuation of Hospitalization: Depression Medication stabilization 12/01/23 Update: Depression Medication stabilization. Update 12/06/23: No changes at this time Update 12/11/23: No changes at this time .   Update: 12/16/2023: No changes at this time.   Update 12/21/2023:  No changes at this time. Update 12/26/23: No changes at this time Update 12/31/23: No changes at this time. Update: 01/05/2024: No changes at this time. Update 01/10/2024: No changes at this time.      Estimated Length of Stay:1 to 7 days 12/01/23 Update: TBD Update 12/06/23: TBD Update 12/11/23: TBD.  Update: 12/16/2023: TBD   Update 12/21/2023:  TBD Update 12/26/23: 12/28/23 Update 12/31/23: TBD. Update: 01/05/2024: TBD Update 01/10/2024: 01/11/24  Last 3 [place] Suicide Severity Risk Score: Flowsheet Row Admission (Current) from 11/25/2023 in [facility] to Hosp-Admission (Discharged) from 11/16/2023 in [facility] from 08/09/2023 in [facility] at [facility]  C-SSRS RISK CATEGORY No Risk No Risk No Risk       Last PHQ 2/9 Scores:    08/13/2020    4:59 PM 08/04/2020    3:16 PM 12/15/2015    7:07 PM  Depression screen PHQ 2/9  Decreased Interest 1 1 0  Down, Depressed, Hopeless 2 2 0  PHQ - 2 Score 3 3 0  Altered sleeping 3 3   Tired, decreased energy 2 2   Change in appetite 2 2   Feeling bad or failure about yourself  1 1   Trouble concentrating 2 2   Moving slowly or fidgety/restless 2 2   Suicidal thoughts 0 0   PHQ-9 Score 15 15   Difficult doing work/chores Somewhat difficult Somewhat difficult     Scribe for Treatment Team: Lum JONETTA Croft, [place] 01/10/2024 12:05 PM
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
## 2024-01-10 NOTE — Progress Notes (Signed)
   01/10/24 2200  Psych Admission Type (Psych Patients Only)  Admission Status Involuntary  Psychosocial Assessment  Patient Complaints None  Eye Contact Fair  Facial Expression Animated  Affect Appropriate to circumstance  Speech Logical/coherent  Interaction Assertive  Motor Activity Slow  Appearance/Hygiene Unremarkable  Behavior Characteristics Cooperative;Appropriate to situation  Mood Pleasant  Thought Process  Coherency Disorganized  Content Preoccupation  Delusions None reported or observed  Perception WDL  Hallucination None reported or observed  Judgment Impaired  Confusion Mild  Danger to Self  Current suicidal ideation? Denies  Agreement Not to Harm Self Yes  Description of Agreement Verbal  Danger to Others  Danger to Others None reported or observed

## 2024-01-10 NOTE — Plan of Care (Signed)
  Problem: Education: Goal: Knowledge of the prescribed therapeutic regimen will improve Outcome: Progressing   Problem: Activity: Goal: Interest or engagement in activities will improve Outcome: Progressing   Problem: Coping: Goal: Ability to demonstrate self-control will improve Outcome: Progressing

## 2024-01-10 NOTE — Group Note (Signed)
 Recreation Therapy Group Note   Group Topic:Relaxation  Group Date: 01/10/2024 Start Time: 1100 End Time: 1135 Facilitators: Celestia Jeoffrey BRAVO, LRT, CTRS Location:  Dayroom  Group Description: Chair Yoga. LRT and patients discussed the benefits of yoga and how it differs from strength exercises. LRT educated patients on the mental and physical benefits of yoga and deep breathing and how it can be used as a associate professor. LRT and patients followed along to a guided yoga session on the television that focused on all parts of the body, as well as deep breathing. Pt encouraged to stop movement at any time if they feel discomfort or pain.   Goal Area(s) Addressed: Patient will practice using relaxation technique. Patient will identify a new coping skill.  Patient will follow multistep directions to reduce anxiety and stress.   Affect/Mood: Appropriate   Participation Level: Active and Engaged   Participation Quality: Independent   Behavior: Appropriate, Calm, and Cooperative   Speech/Thought Process: Coherent   Insight: Good   Judgement: Good   Modes of Intervention: Activity, Education, and Exploration   Patient Response to Interventions:  Attentive, Engaged, and Receptive   Education Outcome:  Acknowledges education   Clinical Observations/Individualized Feedback: Landa was active in their participation of session activities and group discussion. Pt spontaneously contributed to group discussion while interacting well with LRT and peers duration of session.    Plan: Continue to engage patient in RT group sessions 2-3x/week.   Jeoffrey BRAVO Celestia, LRT, CTRS 01/10/2024 1:45 PM

## 2024-01-10 NOTE — Group Note (Signed)
 Recreation Therapy Group Note   Group Topic:Emotion Expression  Group Date: 01/10/2024 Start Time: 1500 End Time: 1600 Facilitators: Celestia Jeoffrey BRAVO, LRT, CTRS Location:  Dayroom  Group Description: Positivity Collage. LRT and patients discussed the importance of having a positive mindset and being happy. Patients received magazines, safety scissors, a glue stick and a piece of paper. Pts were encouraged to find images or words in the magazines that showed "happiness" or positivity to them. Pt shared their collage with the group once they were finished. LRT and pts discussed how it can be difficult to always have a positive mindset, especially when they have mental health challenges.   Goal Area(s) Addressed:  Pt will identify things associate with positivity. Pt will reduce negative thinking. Pt will identify a new coping skill of thinking positive thoughts.    Affect/Mood: Appropriate   Participation Level: Active and Engaged   Participation Quality: Independent   Behavior: Calm and Cooperative   Speech/Thought Process: Coherent   Insight: Fair   Judgement: Fair    Modes of Intervention: Art, Education, Exploration, and Guided Discussion   Patient Response to Interventions:  Attentive, Engaged, Interested , and Receptive   Education Outcome:  Acknowledges education   Clinical Observations/Individualized Feedback: Natasha Chandler was active in their participation of session activities and group discussion. Pt identified homes, gardens and bbq as positive things. Pt interacted well with LRT and peers duration of session.    Plan: Continue to engage patient in RT group sessions 2-3x/week.   [address], LRT, CTRS 01/10/2024 5:10 PM

## 2024-01-10 NOTE — Progress Notes (Addendum)
 Patient pleasant and cooperative.  Denies SI/HI and AVH.  Denies feelings of anxiety and depression.  Denies pan.  Reports she slept well.    Compliant with scheduled medications.  BP medications held. Dr. Victoria made aware. 15 min checks in place for safety.  Patient is present in the milieu.  Appropriate interaction with peers,

## 2024-01-11 DIAGNOSIS — F333 Major depressive disorder, recurrent, severe with psychotic symptoms: Secondary | ICD-10-CM | POA: Diagnosis not present

## 2024-01-11 LAB — GLUCOSE, CAPILLARY: Glucose-Capillary: 150 mg/dL — ABNORMAL HIGH (ref 70–99)

## 2024-01-11 MED ORDER — ROSUVASTATIN CALCIUM 20 MG PO TABS: 20.0000 mg | ORAL_TABLET | Freq: Every day | ORAL | 0 refills | Status: AC

## 2024-01-11 MED ORDER — SERTRALINE HCL 100 MG PO TABS: 100.0000 mg | ORAL_TABLET | Freq: Every day | ORAL | 0 refills | Status: AC

## 2024-01-11 MED ORDER — ALBUTEROL SULFATE (2.5 MG/3ML) 0.083% IN NEBU: 3.0000 mL | INHALATION_SOLUTION | Freq: Four times a day (QID) | RESPIRATORY_TRACT | 12 refills | Status: AC | PRN

## 2024-01-11 MED ORDER — DONEPEZIL HCL 5 MG PO TABS: 5.0000 mg | ORAL_TABLET | Freq: Every day | ORAL | 0 refills | Status: AC

## 2024-01-11 MED ORDER — MENTHOL 3 MG MT LOZG: 1.0000 | LOZENGE | OROMUCOSAL | 12 refills | Status: AC | PRN

## 2024-01-11 MED ORDER — QUETIAPINE FUMARATE 50 MG PO TABS: 50.0000 mg | ORAL_TABLET | Freq: Every day | ORAL | 0 refills | Status: AC

## 2024-01-11 MED ORDER — HYDROCHLOROTHIAZIDE 12.5 MG PO TABS: 12.5000 mg | ORAL_TABLET | Freq: Every day | ORAL | 0 refills | Status: AC

## 2024-01-11 MED ORDER — LEVOTHYROXINE SODIUM 50 MCG PO TABS: 50.0000 ug | ORAL_TABLET | Freq: Every day | ORAL | 0 refills | Status: AC

## 2024-01-11 MED ORDER — FLUTICASONE PROPIONATE 50 MCG/ACT NA SUSP: 2.0000 | Freq: Every day | NASAL | 1 refills | Status: AC | PRN

## 2024-01-11 MED ORDER — INSULIN ASPART 100 UNIT/ML IJ SOLN: 8.0000 [IU] | Freq: Three times a day (TID) | INTRAMUSCULAR | 11 refills | Status: AC

## 2024-01-11 MED ORDER — CARMEX CLASSIC LIP BALM EX OINT: TOPICAL_OINTMENT | CUTANEOUS | 0 refills | Status: AC | PRN

## 2024-01-11 MED ORDER — TRAZODONE HCL 50 MG PO TABS: 50.0000 mg | ORAL_TABLET | Freq: Every evening | ORAL | 0 refills | Status: AC | PRN

## 2024-01-11 MED ORDER — ASPIRIN 81 MG PO TBEC: 81.0000 mg | DELAYED_RELEASE_TABLET | Freq: Every day | ORAL | 12 refills | Status: AC

## 2024-01-11 MED ORDER — MOMETASONE FURO-FORMOTEROL FUM 200-5 MCG/ACT IN AERO: 2.0000 | INHALATION_SPRAY | Freq: Two times a day (BID) | RESPIRATORY_TRACT | 1 refills | Status: AC

## 2024-01-11 MED ORDER — AMLODIPINE BESYLATE 10 MG PO TABS: 10.0000 mg | ORAL_TABLET | Freq: Every day | ORAL | 0 refills | Status: AC

## 2024-01-11 MED ORDER — LORATADINE 10 MG PO TABS: 10.0000 mg | ORAL_TABLET | Freq: Every day | ORAL | 0 refills | Status: AC | PRN

## 2024-01-11 MED ORDER — DESMOPRESSIN ACETATE 0.1 MG PO TABS: 0.0500 mg | ORAL_TABLET | Freq: Every day | ORAL | 0 refills | Status: AC

## 2024-01-11 NOTE — Progress Notes (Signed)
 Patient pleasant and cooperative with staff.  Denies SI/HI and AVH.  Denies feelings of anxiety and depression.  Denies pain.  Good appetite.  Reports she slept well.   Compliant with scheduled medications. 15 min checks in place for safety.  Patient is present in the milieu.  Appropriate interaction with staff.

## 2024-01-11 NOTE — [facility] Suicide Risk Assessment (Signed)
 [facility] Discharge Suicide Risk Assessment   Principal Problem: MDD (major depressive disorder), recurrent, severe, with psychosis (HCC) Discharge Diagnoses: Principal Problem:   MDD (major depressive disorder), recurrent, severe, with psychosis (HCC)   Musculoskeletal: Strength & Muscle Tone: within normal limits Gait & Station: normal Patient leans: N/A  Psychiatric Specialty Exam:   Presentation  General Appearance:  Appropriate for Environment; Casual   Eye Contact: Fair   Speech: Spontaneous   Speech Volume: Soft       Mood and Affect  Mood: Good   Affect: Pleasant     Thought Process  Thought Processes: Tangential   Descriptions of Associations:Tangential   Orientation:Partial   Thought Content:Rumination   Hallucinations:Hallucinations: None   Ideas of Reference:None   Suicidal Thoughts: No   Homicidal Thoughts: No     Sensorium  Memory: Impaired   Judgment: Fair   Insight: Limited     Executive Functions  Concentration: Fair   Attention Span: Fair     Language: Fair     Psychomotor Activity  Psychomotor Activity: Psychomotor Activity: Normal   Musculoskeletal: Strength & Muscle Tone: within normal limits Gait & Station: normal Assets  Assets: Desire for Improvement; Communication Skills       Physical Exam: Physical Exam Vitals and nursing note reviewed.  HENT:     Head: Normocephalic.     Nose: Nose normal.     Mouth/Throat:     Mouth: Mucous membranes are moist.  Eyes:     Pupils: Pupils are equal, round, and reactive to light.  Cardiovascular:     Rate and Rhythm: Normal rate.  Pulmonary:     Effort: Pulmonary effort is normal.  Abdominal:     General: Bowel sounds are normal.  Skin:    General: Skin is warm.  Neurological:     General: No focal deficit present.     Mental Status: She is alert.      Review of Systems  Constitutional: Negative.   HENT: Negative.    Eyes: Negative.    Respiratory: Negative.    Cardiovascular: Negative.   Gastrointestinal: Negative.   Skin: Negative.     Blood pressure (!) 138/122, pulse 64, temperature 98.3 F (36.8 C), resp. rate 15, height 5' 2 (1.575 m), weight 77.1 kg, SpO2 100%. Body mass index is 31.09 kg/m.  Mental Status Per Nursing Assessment::   On Admission:  NA  Demographic Factors:  Age 68 or older, Living alone, and Unemployed  Loss Factors: Decrease in vocational status and Decline in physical health  Historical Factors: Impulsivity  Risk Reduction Factors:   Positive social support, Positive therapeutic relationship, and Positive coping skills or problem solving skills  Continued Clinical Symptoms:  Severe Anxiety and/or Agitation  Cognitive Features That Contribute To Risk:  Polarized thinking and Thought constriction (tunnel vision)    Suicide Risk:  Minimal: No identifiable suicidal ideation.     Follow-up Information     [facility] Follow up.   Specialty: Assisted Living Facility Why: You are scheduled to go to [facility] at 11:00 AM. All medications and follow-up will be sheduled with their providers upon your arrival. Contact information: [address] [place]  [phone]                Plan Of Care/Follow-up recommendations:  Per H&P  Wenceslao Harries, MD 01/11/2024, 8:29 AM

## 2024-01-11 NOTE — Plan of Care (Signed)
  Problem: Education: Goal: Knowledge of the prescribed therapeutic regimen will improve Outcome: Progressing   Problem: Education: Goal: Emotional status will improve Outcome: Progressing Goal: Mental status will improve Outcome: Progressing   
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
## 2024-01-11 NOTE — Progress Notes (Signed)
  [facility] Adult Case Management Discharge Plan :  Will you be returning to the same living situation after discharge:  No. Pt is going to [facility]  At discharge, do you have transportation home?: Yes,  CSW will assist with transportation  Do you have the ability to pay for your medications: Yes,  HUMANA MEDICARE / HUMANA MEDICARE HMO  Release of information consent forms completed and in the chart;  Patient's signature needed at discharge.  Patient to Follow up at:  Follow-up Information     [facility] [place] Follow up.   Specialty: Assisted Living Facility Why: You are scheduled to go to [facility] at 11:00 AM. All medications and follow-up will be sheduled with their providers upon your arrival. Contact information: [address] [place]  [phone]                Next level of care provider has access to [facility] Link:no  Safety Planning and Suicide Prevention discussed: Chaney Dragon Pann/aunt [phone])     Has patient been referred to the Quitline?: Patient does not use tobacco/nicotine products  Patient has been referred for addiction treatment: No known substance use disorder.  [address], LCSWA 01/11/2024, 9:21 AM

## 2024-01-11 NOTE — Progress Notes (Signed)
   01/11/24 0545  15 Minute Checks  Location Bedroom  Visual Appearance Calm  Behavior Composed  Sleep (Behavioral Health Patients Only)  Calculate sleep? (Click Yes once per 24 hr at 0600 safety check) Yes  Documented sleep last 24 hours 6.75

## 2024-01-11 NOTE — Progress Notes (Signed)
 Discharge Note:  Patient denies SI/HI/AVH at this time. Discharge instructions, AVS, prescriptions, and transition record reviewed with patient. Patient agrees to comply with medication management, follow-up visit, and outpatient therapy. Patient belongings returned to patient. Questions and concerns addressed and answered.  Patient ambulatory off unit. Patient discharged to memory care unit via [employer].    Patient completed Suicide Safety Plan.

## 2024-01-11 NOTE — Group Note (Signed)
 Recreation Therapy Group Note   Group Topic:Relaxation  Group Date: 01/11/2024 Start Time: 1100 End Time: 1135 Facilitators: Celestia Jeoffrey BRAVO, LRT, CTRS Location:  Dayroom  Group Description: Meditation. LRT and patients discussed what they know about meditation and mindfulness. LRT played a Deep Breathing Meditation exercise script for patients to follow along to. LRT and patients discussed how meditation and deep breathing can be used as a coping skill post--discharge to help manage symptoms of stress.   Goal Area(s) Addressed: Patient will practice using relaxation technique. Patient will identify a new coping skill.  Patient will follow multistep directions to reduce anxiety and stress.   Affect/Mood: N/A   Participation Level: Did not attend    Clinical Observations/Individualized Feedback: Patient has been discharged.  Plan: Continue to engage patient in RT group sessions 2-3x/week.   [address], LRT, CTRS 01/11/2024 1:22 PM
